# Patient Record
Sex: Male | Born: 1960 | Race: White | Hispanic: No | Marital: Single | Smoking: Never smoker
Health system: Southern US, Community
[De-identification: ages and names within clinical notes are randomized; demographics above are authoritative.]

## PROBLEM LIST (undated history)

## (undated) DIAGNOSIS — I10 Essential (primary) hypertension: Secondary | ICD-10-CM

## (undated) DIAGNOSIS — F428 Other obsessive-compulsive disorder: Secondary | ICD-10-CM

## (undated) DIAGNOSIS — G4730 Sleep apnea, unspecified: Secondary | ICD-10-CM

## (undated) DIAGNOSIS — F429 Obsessive-compulsive disorder, unspecified: Secondary | ICD-10-CM

## (undated) DIAGNOSIS — K219 Gastro-esophageal reflux disease without esophagitis: Secondary | ICD-10-CM

## (undated) DIAGNOSIS — I209 Angina pectoris, unspecified: Secondary | ICD-10-CM

## (undated) DIAGNOSIS — E039 Hypothyroidism, unspecified: Secondary | ICD-10-CM

## (undated) DIAGNOSIS — K529 Noninfective gastroenteritis and colitis, unspecified: Secondary | ICD-10-CM

## (undated) DIAGNOSIS — F32A Depression, unspecified: Secondary | ICD-10-CM

## (undated) DIAGNOSIS — T7840XA Allergy, unspecified, initial encounter: Secondary | ICD-10-CM

## (undated) DIAGNOSIS — F411 Generalized anxiety disorder: Secondary | ICD-10-CM

## (undated) DIAGNOSIS — F329 Major depressive disorder, single episode, unspecified: Secondary | ICD-10-CM

## (undated) DIAGNOSIS — F319 Bipolar disorder, unspecified: Secondary | ICD-10-CM

## (undated) HISTORY — DX: Obsessive-compulsive disorder, unspecified: F42.9

## (undated) HISTORY — PX: MOUTH SURGERY: SHX715

## (undated) HISTORY — PX: NASAL SEPTUM SURGERY: SHX37

## (undated) HISTORY — PX: MYRINGOTOMY WITH TUBE PLACEMENT: SHX5663

## (undated) HISTORY — DX: Other obsessive-compulsive disorder: F42.8

## (undated) HISTORY — DX: Allergy, unspecified, initial encounter: T78.40XA

## (undated) HISTORY — DX: Generalized anxiety disorder: F41.1

## (undated) HISTORY — DX: Gastro-esophageal reflux disease without esophagitis: K21.9

---

## 2006-11-11 ENCOUNTER — Encounter: Payer: Self-pay | Admitting: Orthopedic Surgery

## 2010-01-27 LAB — CBC AND DIFFERENTIAL
HEMATOCRIT: 49 % (ref 41–53)
Hemoglobin: 17.3 g/dL (ref 13.5–17.5)
Neutrophils Absolute: 60 /uL
PLATELETS: 274 10*3/uL (ref 150–399)
WBC: 6.3 10^3/mL

## 2010-04-05 ENCOUNTER — Ambulatory Visit: Payer: Self-pay | Admitting: Family Medicine

## 2011-03-09 LAB — HEPATIC FUNCTION PANEL
ALT: 25 U/L (ref 10–40)
AST: 21 U/L (ref 14–40)
Alkaline Phosphatase: 65 U/L (ref 25–125)
Bilirubin, Total: 0.5 mg/dL

## 2011-03-09 LAB — BASIC METABOLIC PANEL
BUN: 19 mg/dL (ref 4–21)
Creatinine: 0.8 mg/dL (ref <=1.3)
Potassium: 4.4 mmol/L (ref 3.4–5.3)
Sodium: 141 mmol/L (ref 137–147)

## 2011-09-16 ENCOUNTER — Ambulatory Visit: Payer: Self-pay | Admitting: Family Medicine

## 2013-06-19 LAB — LIPID PANEL
CHOLESTEROL: 205 mg/dL — AB (ref 0–200)
HDL: 53 mg/dL (ref 35–70)
LDL CALC: 133 mg/dL
TRIGLYCERIDES: 94 mg/dL (ref 40–160)

## 2013-06-19 LAB — HEMOGLOBIN A1C: HEMOGLOBIN A1C: 5.5 % (ref 4.0–6.0)

## 2013-06-19 LAB — TSH: TSH: 0.77 u[IU]/mL (ref <=5.90)

## 2013-06-19 LAB — BASIC METABOLIC PANEL: Glucose: 98 mg/dL

## 2014-11-10 DIAGNOSIS — F32A Depression, unspecified: Secondary | ICD-10-CM | POA: Insufficient documentation

## 2014-11-10 DIAGNOSIS — F411 Generalized anxiety disorder: Secondary | ICD-10-CM | POA: Insufficient documentation

## 2014-11-10 DIAGNOSIS — F329 Major depressive disorder, single episode, unspecified: Secondary | ICD-10-CM | POA: Insufficient documentation

## 2014-11-10 DIAGNOSIS — E785 Hyperlipidemia, unspecified: Secondary | ICD-10-CM | POA: Insufficient documentation

## 2014-11-10 DIAGNOSIS — K589 Irritable bowel syndrome without diarrhea: Secondary | ICD-10-CM | POA: Insufficient documentation

## 2014-11-10 DIAGNOSIS — E039 Hypothyroidism, unspecified: Secondary | ICD-10-CM | POA: Insufficient documentation

## 2014-11-10 DIAGNOSIS — F319 Bipolar disorder, unspecified: Secondary | ICD-10-CM | POA: Insufficient documentation

## 2014-11-10 DIAGNOSIS — F428 Other obsessive-compulsive disorder: Secondary | ICD-10-CM | POA: Insufficient documentation

## 2014-11-10 DIAGNOSIS — I10 Essential (primary) hypertension: Secondary | ICD-10-CM | POA: Insufficient documentation

## 2014-11-15 ENCOUNTER — Ambulatory Visit (INDEPENDENT_AMBULATORY_CARE_PROVIDER_SITE_OTHER): Payer: Self-pay | Admitting: Family Medicine

## 2014-11-15 ENCOUNTER — Encounter: Payer: Self-pay | Admitting: Family Medicine

## 2014-11-15 VITALS — BP 108/64 | HR 64 | Temp 97.9°F | Resp 16 | Wt 154.0 lb

## 2014-11-15 DIAGNOSIS — L03115 Cellulitis of right lower limb: Secondary | ICD-10-CM

## 2014-11-15 DIAGNOSIS — M7989 Other specified soft tissue disorders: Secondary | ICD-10-CM

## 2014-11-15 DIAGNOSIS — M79671 Pain in right foot: Secondary | ICD-10-CM

## 2014-11-15 MED ORDER — DOXYCYCLINE HYCLATE 100 MG PO TABS: 100.0000 mg | ORAL_TABLET | Freq: Two times a day (BID) | ORAL | Status: DC

## 2014-11-15 NOTE — Patient Instructions (Signed)
Instructed pt to use soap and water to clean foot, also after finish antibiotic use OTC Lamisil.

## 2014-11-15 NOTE — Progress Notes (Signed)
William Coleman.  MRN: [id number] DOB: 1960/11/30  Subjective:  HPI  Pt is having right foot pain and swelling. It has been going on for about a week and a half. He does not know of any trauma that he has done to his foot. He does have a lesion of irritation between his 2nd and 3rd toe on this foot, but reports that that is not causing much pain. He reports that on a scale from 1-10 he is normally about a 2-3. He noticed the swelling more that anything and wanted to get it checked out.    Patient Active Problem List   Diagnosis Date Noted  . Allergic rhinitis 11/10/2014  . Affective bipolar disorder 11/10/2014  . Clinical depression 11/10/2014  . Anxiety, generalized 11/10/2014  . HLD (hyperlipidemia) 11/10/2014  . BP (high blood pressure) 11/10/2014  . Adult hypothyroidism 11/10/2014  . 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/10/2014  . 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 Obstructive apnea 11/10/2014    History reviewed. No pertinent past medical history.  History   Social History  . Marital Status: Single    Spouse Name: N/A  . Number of Children: N/A  . Years of Education: N/A   Occupational History  . Not on file.   Social History Main Topics  . Smoking status: Never Smoker   . Smokeless tobacco: Not on file  . Alcohol Use: Yes     Comment: rarely  . Drug Use: No  . Sexual Activity: Not on file   Other Topics Concern  . Not on file   Social History Narrative    Outpatient Prescriptions Prior to Visit  Medication Sig Dispense Refill  . ARIPiprazole (ABILIFY) 5 MG tablet Take by mouth.    Marland Kitchen ascorbic acid (VITAMIN C) 500 MG tablet Take by mouth.    Marland Kitchen aspirin 81 MG tablet Take by mouth.    . levothyroxine (SYNTHROID, LEVOTHROID) 25 MCG tablet Take by mouth.    . MULTIPLE VITAMIN PO Take by mouth.    . rosuvastatin (CRESTOR) 10 MG tablet Take by mouth.    . dextroamphetamine (DEXTROSTAT) 10 MG tablet Take by mouth.    . losartan (COZAAR) 50 MG tablet Take by mouth.     No  facility-administered medications prior to visit.    Allergies  Allergen Reactions  . Amoxicillin   . Penicillins   . Sulfa Antibiotics     Review of Systems  Constitutional: Negative.   HENT: Negative.   Eyes: Negative.   Respiratory: Negative.   Cardiovascular: Negative.   Gastrointestinal: Negative.   Genitourinary: Negative.   Musculoskeletal: Positive for joint pain (foot pain).  Skin: Negative.   Neurological: Negative.   Endo/Heme/Allergies: Negative.   Psychiatric/Behavioral: Negative.    Objective:  BP 108/64 mmHg  Pulse 64  Temp(Src) 97.9 F (36.6 C) (Oral)  Resp 16  Wt 154 lb (69.854 kg)  Physical Exam  Constitutional: He is well-developed, well-nourished, and in no distress.  HENT:  Head: Normocephalic and atraumatic.  Right Ear: External ear normal.  Left Ear: External ear normal.  Nose: Nose normal.  Mouth/Throat: Oropharynx is clear and moist.  Eyes: Conjunctivae and EOM are normal. Pupils are equal, round, and reactive to light.  Neck: Normal range of motion. Neck supple.  Cardiovascular: Normal rate, regular rhythm, normal heart sounds and intact distal pulses.   Pulmonary/Chest: Effort normal and breath sounds normal.  Musculoskeletal: Normal range of motion.  Neurological: He is alert. He has normal reflexes. Gait normal.  GCS score is 15.  Skin: Skin is warm and dry.  Psychiatric: Mood, memory, affect and judgment normal.    Assessment and Plan :  Foot pain, right  Foot swelling  Foot pain, right  Foot swelling  Cellulitis of right lower extremity - Plan: doxycycline (VIBRA-TABS) 100 MG tablet Treat with Doxy, if not better will get xray. If doxy is too expensive will give Levofloxacin. Instructed pt to use soap and water to clean foot, also after finish antibiotic use OTC Lamisil.   Miguel Aschoff MD [facility] 11/15/2014 1:53 PM

## 2014-11-29 ENCOUNTER — Telehealth: Payer: Self-pay

## 2014-11-29 ENCOUNTER — Ambulatory Visit
Admission: RE | Admit: 2014-11-29 | Discharge: 2014-11-29 | Disposition: A | Discharge status: Home / Self Care | Payer: Self-pay | Source: Ambulatory Visit | Attending: Family Medicine | Admitting: Family Medicine

## 2014-11-29 ENCOUNTER — Ambulatory Visit (INDEPENDENT_AMBULATORY_CARE_PROVIDER_SITE_OTHER): Payer: Self-pay | Admitting: Family Medicine

## 2014-11-29 ENCOUNTER — Encounter: Payer: Self-pay | Admitting: Family Medicine

## 2014-11-29 VITALS — BP 130/84 | HR 72 | Temp 97.8°F | Resp 16 | Ht 67.5 in | Wt 155.2 lb

## 2014-11-29 DIAGNOSIS — R600 Localized edema: Secondary | ICD-10-CM

## 2014-11-29 DIAGNOSIS — M79671 Pain in right foot: Secondary | ICD-10-CM | POA: Insufficient documentation

## 2014-11-29 NOTE — Telephone Encounter (Signed)
-----   Message from Carmon Ginsberg, [place] sent at 11/29/2014  1:36 PM EDT ----- Normal foot x-ray

## 2014-11-29 NOTE — Telephone Encounter (Signed)
Left message to call back  

## 2014-11-29 NOTE — Progress Notes (Signed)
Subjective:     Patient ID: William Coleman., male   DOB: [birth year], 54 y.o.   MRN: [id number]  HPI  Chief Complaint  Patient presents with  . Foot Pain    Patient is present in office for follow up visit from 11/15/14 he states that Dr. Rosanna Randy had placed him on antibiotic which he has finished and pain and swelling are still present. Patient states that he has concerns that there is a possible fracture.   Denies specific injury. 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 Mild pain with w.b.and swelling persists.   Review of Systems     Objective:   Physical Exam  Constitutional: He appears well-developed and well-nourished. No distress.  Musculoskeletal: He exhibits no tenderness. Edema: 1+ right pedal edema.  Ankle and foot ligaments stable with no pain on testing Right DF/PF 5/5.     Assessment:     1. Foot pain, right - DG Foot Complete Right; Future  2. Pedal edema - DG Foot Complete Right; Future    Plan:     Discussed foot elevation and scheduling ibuprofen 400 mg 3 x day with food pending x-ray report.

## 2014-11-29 NOTE — Patient Instructions (Signed)
May use ibuprofen 400 mg. Three times a day with meals Elevate your foot after work

## 2014-11-30 NOTE — Telephone Encounter (Signed)
LMTCB Miangel Flom Drozdowski, CMA  

## 2014-11-30 NOTE — Telephone Encounter (Signed)
Informed pt as below. William Coleman, CMA  

## 2015-08-08 ENCOUNTER — Other Ambulatory Visit: Payer: Self-pay

## 2015-08-08 MED ORDER — LOSARTAN POTASSIUM 50 MG PO TABS: 50.0000 mg | ORAL_TABLET | Freq: Every day | ORAL | Status: DC

## 2015-09-25 ENCOUNTER — Ambulatory Visit: Payer: Self-pay | Admitting: Family Medicine

## 2015-11-09 ENCOUNTER — Encounter: Payer: Self-pay | Admitting: Emergency Medicine

## 2015-11-09 ENCOUNTER — Emergency Department
Admission: EM | Admit: 2015-11-09 | Discharge: 2015-11-10 | Disposition: A | Discharge status: Other Acute Inpatient Hospital | Payer: BLUE CROSS/BLUE SHIELD | Attending: Emergency Medicine | Admitting: Emergency Medicine

## 2015-11-09 DIAGNOSIS — E039 Hypothyroidism, unspecified: Secondary | ICD-10-CM | POA: Diagnosis present

## 2015-11-09 DIAGNOSIS — I10 Essential (primary) hypertension: Secondary | ICD-10-CM | POA: Diagnosis not present

## 2015-11-09 DIAGNOSIS — R45851 Suicidal ideations: Secondary | ICD-10-CM | POA: Insufficient documentation

## 2015-11-09 DIAGNOSIS — K589 Irritable bowel syndrome without diarrhea: Secondary | ICD-10-CM | POA: Diagnosis present

## 2015-11-09 DIAGNOSIS — E785 Hyperlipidemia, unspecified: Secondary | ICD-10-CM | POA: Diagnosis not present

## 2015-11-09 DIAGNOSIS — F429 Obsessive-compulsive disorder, unspecified: Secondary | ICD-10-CM | POA: Insufficient documentation

## 2015-11-09 DIAGNOSIS — F332 Major depressive disorder, recurrent severe without psychotic features: Secondary | ICD-10-CM

## 2015-11-09 DIAGNOSIS — F411 Generalized anxiety disorder: Secondary | ICD-10-CM | POA: Diagnosis present

## 2015-11-09 DIAGNOSIS — F331 Major depressive disorder, recurrent, moderate: Secondary | ICD-10-CM | POA: Diagnosis not present

## 2015-11-09 DIAGNOSIS — F322 Major depressive disorder, single episode, severe without psychotic features: Secondary | ICD-10-CM | POA: Diagnosis not present

## 2015-11-09 HISTORY — DX: Major depressive disorder, single episode, unspecified: F32.9

## 2015-11-09 HISTORY — DX: Obsessive-compulsive disorder, unspecified: F42.9

## 2015-11-09 HISTORY — DX: Depression, unspecified: F32.A

## 2015-11-09 HISTORY — DX: Essential (primary) hypertension: I10

## 2015-11-09 LAB — URINE DRUG SCREEN, QUALITATIVE (ARMC ONLY)
Amphetamines, Ur Screen: NOT DETECTED
BENZODIAZEPINE, UR SCRN: NOT DETECTED
Barbiturates, Ur Screen: NOT DETECTED
CANNABINOID 50 NG, UR ~~LOC~~: NOT DETECTED
Cocaine Metabolite,Ur ~~LOC~~: NOT DETECTED
MDMA (ECSTASY) UR SCREEN: NOT DETECTED
Methadone Scn, Ur: NOT DETECTED
OPIATE, UR SCREEN: NOT DETECTED
PHENCYCLIDINE (PCP) UR S: NOT DETECTED
Tricyclic, Ur Screen: NOT DETECTED

## 2015-11-09 LAB — COMPREHENSIVE METABOLIC PANEL
ALK PHOS: 65 U/L (ref 38–126)
ALT: 25 U/L (ref 17–63)
AST: 25 U/L (ref 15–41)
Albumin: 4.5 g/dL (ref 3.5–5.0)
Anion gap: 8 (ref 5–15)
BILIRUBIN TOTAL: 0.7 mg/dL (ref 0.3–1.2)
BUN: 19 mg/dL (ref 6–20)
CO2: 32 mmol/L (ref 22–32)
CREATININE: 1.08 mg/dL (ref 0.61–1.24)
Calcium: 9.3 mg/dL (ref 8.9–10.3)
Chloride: 99 mmol/L — ABNORMAL LOW (ref 101–111)
GFR calc Af Amer: >60 mL/min (ref >60)
Glucose, Bld: 84 mg/dL (ref 65–99)
Potassium: 3.9 mmol/L (ref 3.5–5.1)
Sodium: 139 mmol/L (ref 135–145)
TOTAL PROTEIN: 7.5 g/dL (ref 6.5–8.1)

## 2015-11-09 LAB — CBC
HCT: 50.5 % (ref 40.0–52.0)
Hemoglobin: 17.4 g/dL (ref 13.0–18.0)
MCH: 31.2 pg (ref 26.0–34.0)
MCHC: 34.4 g/dL (ref 32.0–36.0)
MCV: 90.8 fL (ref 80.0–100.0)
PLATELETS: 298 10*3/uL (ref 150–440)
RBC: 5.56 MIL/uL (ref 4.40–5.90)
RDW: 13.3 % (ref 11.5–14.5)
WBC: 8.7 10*3/uL (ref 3.8–10.6)

## 2015-11-09 LAB — SALICYLATE LEVEL: Salicylate Lvl: <4 mg/dL (ref 2.8–30.0)

## 2015-11-09 LAB — ACETAMINOPHEN LEVEL: Acetaminophen (Tylenol), Serum: <10 ug/mL — ABNORMAL LOW (ref 10–30)

## 2015-11-09 LAB — ETHANOL

## 2015-11-09 MED ORDER — LOSARTAN POTASSIUM 50 MG PO TABS
50.0000 mg | ORAL_TABLET | Freq: Every day | ORAL | Status: DC
  Administered 2015-11-09: 50 mg via ORAL
  Filled 2015-11-09: qty 2
  Filled 2015-11-09: qty 1

## 2015-11-09 NOTE — ED Notes (Signed)
Report received from [place], [place].

## 2015-11-09 NOTE — ED Notes (Signed)
Pt to ED today from [place] after admitting to suicidal ideation.  Pt states he had planned [place].  Pt states he has struggled with depression on a long-term basis.

## 2015-11-09 NOTE — BH Assessment (Signed)
Writer contacted [facility] to follow up Jarrett Soho [phone]). Pt referral still under review.

## 2015-11-09 NOTE — ED Provider Notes (Signed)
[facility] Emergency Department Provider Note  ____________________________________________  Time seen: 7:20 PM  I have reviewed the triage vital signs and the nursing notes.   HISTORY  Chief Complaint suicial ideation     HPI William Coleman. is a 55 y.o. male sent to the ED under IVC from [facility] due to suicidal ideation with a plan to [place]. Patient reports long-standing depression, not on any medications because he "couldn't tolerate the side effects". No specific stressors, no other acute symptoms. Only taking Synthroid right now.     Past Medical History  Diagnosis Date  . Obsessive compulsive disorder   . Depression      Patient Active Problem List   Diagnosis Date Noted  . Allergic rhinitis 11/10/2014  . Affective bipolar disorder ([place]) 11/10/2014  . Clinical depression 11/10/2014  . Anxiety, generalized 11/10/2014  . HLD (hyperlipidemia) 11/10/2014  . BP (high blood pressure) 11/10/2014  . Adult hypothyroidism 11/10/2014  . 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/10/2014  . 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 Obstructive apnea 11/10/2014     Past Surgical History  Procedure Laterality Date  . Nasal septum surgery    . Mouth surgery      orthodontic surgery for underbite  . Myringotomy with tube placement       Current Outpatient Rx  Name  Route  Sig  Dispense  Refill  . ARIPiprazole (ABILIFY) 5 MG tablet   Oral   Take by mouth.         Marland Kitchen ascorbic acid (VITAMIN C) 500 MG tablet   Oral   Take by mouth.         Marland Kitchen aspirin 81 MG tablet   Oral   Take by mouth.         . dextroamphetamine (DEXTROSTAT) 10 MG tablet   Oral   Take by mouth.         . levothyroxine (SYNTHROID, LEVOTHROID) 25 MCG tablet   Oral   Take by mouth.         . losartan (COZAAR) 50 MG tablet   Oral   Take 1 tablet (50 mg total) by mouth daily.   30 tablet   0     Need appointment for further refills please let pt ...   . MULTIPLE VITAMIN PO   Oral   Take by mouth.         . rosuvastatin (CRESTOR) 10 MG tablet   Oral   Take by mouth.            Allergies Amoxicillin; Penicillins; and Sulfa antibiotics   Family History  Problem Relation Age of Onset  . Depression Mother   . Anxiety disorder Mother   . Congestive Heart Failure Father   . Prostate cancer Father   . Hypertension Father     Social History Social History  Substance Use Topics  . Smoking status: Never Smoker   . Smokeless tobacco: None  . Alcohol Use: Yes     Comment: rarely    Review of Systems  Constitutional:   No fever or chills.  Eyes:   No vision changes.  ENT:   No sore throat. No rhinorrhea.Chronic sinus pressure Cardiovascular:   No chest pain. Respiratory:   No dyspnea , positive occasional nonproductive cough Gastrointestinal:   Negative for abdominal pain, vomiting and diarrhea.  Genitourinary:   Negative for dysuria or difficulty urinating. Musculoskeletal:   Negative for focal pain or swelling Neurological:  Negative for headaches 10-point ROS otherwise negative.  ____________________________________________   PHYSICAL EXAM:  VITAL SIGNS: ED Triage Vitals  Enc Vitals Group     BP 11/09/15 1849 172/107 mmHg     Pulse Rate 11/09/15 1849 84     Resp 11/09/15 1849 20     Temp 11/09/15 1849 98.1 F (36.7 C)     Temp src --      SpO2 11/09/15 1849 100 %     Weight 11/09/15 1849 152 lb (68.947 kg)     Height 11/09/15 1849 5\' 7"  (1.702 m)     Head Cir --      Peak Flow --      Pain Score --      Pain Loc --      Pain Edu? --      Excl. in [place]? --     Vital signs reviewed, nursing assessments reviewed.   Constitutional:   Alert and oriented. Well appearing and in no distress. Eyes:   No scleral icterus. No conjunctival pallor. PERRL. EOMI.  No nystagmus. ENT   Head:   Normocephalic and atraumatic.   Nose:   No congestion/rhinnorhea. No septal hematoma   Mouth/Throat:   MMM,  no pharyngeal erythema. No peritonsillar mass.    Neck:   No stridor. No SubQ emphysema. No meningismus. Hematological/Lymphatic/Immunilogical:   No cervical lymphadenopathy. Cardiovascular:   RRR. Symmetric bilateral radial and DP pulses.  No murmurs.  Respiratory:   Normal respiratory effort without tachypnea nor retractions. Breath sounds are clear and equal bilaterally. No wheezes/rales/rhonchi. Gastrointestinal:   Soft and nontender. Non distended. There is no CVA tenderness.  No rebound, rigidity, or guarding. Genitourinary:   deferred Musculoskeletal:   Nontender with normal range of motion in all extremities. No joint effusions.  No lower extremity tenderness.  No edema. Neurologic:   Normal speech and language.  CN 2-10 normal. Motor grossly intact. No gross focal neurologic deficits are appreciated.  Skin:    Skin is warm, dry and intact. No rash noted.  No petechiae, purpura, or bullae. No apparent injuries  ____________________________________________    LABS (pertinent positives/negatives) (all labs ordered are listed, but only abnormal results are displayed) Labs Reviewed  COMPREHENSIVE METABOLIC PANEL  ETHANOL  SALICYLATE LEVEL  ACETAMINOPHEN LEVEL  CBC  URINE DRUG SCREEN, QUALITATIVE ([place])   ____________________________________________   EKG    ____________________________________________    RADIOLOGY    ____________________________________________   PROCEDURES   ____________________________________________   INITIAL IMPRESSION / ASSESSMENT AND PLAN / ED COURSE  Pertinent labs & imaging results that were available during my care of the patient were reviewed by me and considered in my medical decision making (see chart for details).  Patient sent to ED under involuntary commitment. We'll maintain commitment for now. Appears to be medically stable without acute medical symptoms. Not psychotic, calm and cooperative, not requiring any  chemical restraint. Plan from [facility] is for inpatient management. We will await placement for inpatient psychiatric management versus follow-up reassessment by psychiatry and further recommendations..     ____________________________________________   FINAL CLINICAL IMPRESSION(S) / ED DIAGNOSES  Final diagnoses:  Severe episode of recurrent major depressive disorder, without psychotic features ([place])  Suicidal ideation       Portions of this note were generated with dragon dictation software. Dictation errors may occur despite best attempts at proofreading.   Carrie Mew, MD 11/09/15 [phone]

## 2015-11-09 NOTE — BH Assessment (Signed)
Writer received phone call from [place] [phone]) stating, while the patient was with them, they frowarded his information to inpatient facilities for placement option. While patient was in transit to the ER, they received a phone call from [facility], about the patient.   Writer called [facility] ([phone]) and was told they received the information and he was pending review. At this time, patient do not have a bed offer.  Writer updated the ER Staff (Dr. Joni Fears, ER MD, Edmonia Lynch, RN) of status.

## 2015-11-09 NOTE — BHH Counselor (Signed)
Talked to Dr. Joni Fears, [place] at [facility] regarding status of pt and if assessment was needed.  Earlier note from [employer] and others indicated that pt was brought in by [facility] with assessment completed and intake information already sent to [employer].  EDP confirmed that no assessment was needed.  EDP asked that SW could follow-up if possible on placement status at [facility]. Advised staff working with placement.  Faylene Kurtz, MS, CRC, [place] Triage Specialist [facility]

## 2015-11-09 NOTE — ED Notes (Signed)
Patient to [facility] from ED ambulatory without difficulty, to room. Patient is alert and oriented in no apparent distress. Patient denies SI, HI, and AVH at this time. Patient remains calm and cooperative. Patient made aware of security cameras and Q15 minute rounds. Patient encouraged to let Nursing staff know of any concerns or needs.

## 2015-11-09 NOTE — ED Notes (Signed)
Pt ambulatory to treatment room with steady gait. Patient presents to ED with c/o "I have been feeling depressed and suicidal..ibuprofen haven't been happy I haven't been taking any of my medications except Synthroid because back in the 90's I couldn't handle the side effects." Patient reported has plan of walking out [place]. Patient alert and oriented x 4, no increased work in breathing noted. Pt calm and cooperative, verbally acknowledged to contract for safety. MD at bedside.

## 2015-11-10 ENCOUNTER — Inpatient Hospital Stay
Admission: RE | Admit: 2015-11-10 | Discharge: 2015-11-17 | DRG: 885 | Disposition: A | Discharge status: Home / Self Care | Payer: BLUE CROSS/BLUE SHIELD | Source: Intra-hospital | Attending: Psychiatry | Admitting: Psychiatry

## 2015-11-10 DIAGNOSIS — E785 Hyperlipidemia, unspecified: Secondary | ICD-10-CM | POA: Diagnosis present

## 2015-11-10 DIAGNOSIS — I10 Essential (primary) hypertension: Secondary | ICD-10-CM | POA: Diagnosis not present

## 2015-11-10 DIAGNOSIS — Z880 Allergy status to penicillin: Secondary | ICD-10-CM

## 2015-11-10 DIAGNOSIS — R45851 Suicidal ideations: Secondary | ICD-10-CM | POA: Diagnosis present

## 2015-11-10 DIAGNOSIS — F429 Obsessive-compulsive disorder, unspecified: Secondary | ICD-10-CM | POA: Diagnosis present

## 2015-11-10 DIAGNOSIS — J309 Allergic rhinitis, unspecified: Secondary | ICD-10-CM | POA: Diagnosis present

## 2015-11-10 DIAGNOSIS — Z8249 Family history of ischemic heart disease and other diseases of the circulatory system: Secondary | ICD-10-CM

## 2015-11-10 DIAGNOSIS — G4730 Sleep apnea, unspecified: Secondary | ICD-10-CM | POA: Diagnosis not present

## 2015-11-10 DIAGNOSIS — E039 Hypothyroidism, unspecified: Secondary | ICD-10-CM | POA: Diagnosis not present

## 2015-11-10 DIAGNOSIS — Z9889 Other specified postprocedural states: Secondary | ICD-10-CM | POA: Diagnosis not present

## 2015-11-10 DIAGNOSIS — Z8042 Family history of malignant neoplasm of prostate: Secondary | ICD-10-CM | POA: Diagnosis not present

## 2015-11-10 DIAGNOSIS — G4700 Insomnia, unspecified: Secondary | ICD-10-CM | POA: Diagnosis not present

## 2015-11-10 DIAGNOSIS — F332 Major depressive disorder, recurrent severe without psychotic features: Secondary | ICD-10-CM

## 2015-11-10 DIAGNOSIS — Z882 Allergy status to sulfonamides status: Secondary | ICD-10-CM | POA: Diagnosis not present

## 2015-11-10 DIAGNOSIS — F411 Generalized anxiety disorder: Secondary | ICD-10-CM | POA: Diagnosis present

## 2015-11-10 DIAGNOSIS — Z818 Family history of other mental and behavioral disorders: Secondary | ICD-10-CM

## 2015-11-10 DIAGNOSIS — K589 Irritable bowel syndrome without diarrhea: Secondary | ICD-10-CM | POA: Diagnosis present

## 2015-11-10 HISTORY — DX: Hypothyroidism, unspecified: E03.9

## 2015-11-10 HISTORY — DX: Sleep apnea, unspecified: G47.30

## 2015-11-10 HISTORY — DX: Angina pectoris, unspecified: I20.9

## 2015-11-10 HISTORY — DX: Bipolar disorder, unspecified: F31.9

## 2015-11-10 MED ORDER — LEVOTHYROXINE SODIUM 25 MCG PO TABS: 25.0000 ug | ORAL_TABLET | Freq: Every day | ORAL | Status: DC

## 2015-11-10 MED ORDER — ACETAMINOPHEN 325 MG PO TABS
650.0000 mg | ORAL_TABLET | Freq: Once | ORAL | Status: AC
  Administered 2015-11-10: 650 mg via ORAL
  Filled 2015-11-10: qty 2

## 2015-11-10 NOTE — ED Provider Notes (Signed)
-----------------------------------------   7:05 PM on 11/10/2015 -----------------------------------------   Blood pressure 136/97, pulse 68, temperature 97.8 F (36.6 C), temperature source Oral, resp. rate 16, height 5\' 7"  (1.702 m), weight 68.947 kg, SpO2 100 %.  The patient had no acute events since last update.  Admitted downstairs to behavioral health for depression   Hinda Kehr, MD 11/10/15 1905

## 2015-11-10 NOTE — Progress Notes (Signed)
Snack tray given at this time.

## 2015-11-10 NOTE — BH Assessment (Signed)
Assessment Note  William Coleman. is an 55 y.o. male who presents to the ER, after being petitioned by [name]. Patient initially went to [facility] for mental health treatment. He left his job, during his lunch break and went to [employer], as a walk in.  During his evaluation, he voiced SI with plan, to walk into ongoing traffic and or jump off a side of a bridge. He told this Probation officer, he would pull to the side of the road an "pretend like I'm working" on his driver back tire. And when a pick-up truck comes close by he will step out in front of it.    Current stressors that are contributing to his depression are; lack of relationship, ongoing depression and feeling like is an disappointment to his family. Patient does not like his job. He has worked at the same place for approximately two years. He works in Scientist, research (medical), as a Clinical research associate.  He is currently receiving mental health treatment, with [facility]. He is receiving medication management and prescribing physician is [name]. He also receives Interior and spatial designer and sees his worker two times a week.  Patient denies having a history of violence and aggression. He has no involvement with the legal system. He also denies the use of mind-altering substances.  Patient is denying HI and AV/H.  Diagnosis: Depression  Past Medical History:  Past Medical History  Diagnosis Date  . Obsessive compulsive disorder   . Depression   . Hypertension     Past Surgical History  Procedure Laterality Date  . Nasal septum surgery    . Mouth surgery      orthodontic surgery for underbite  . Myringotomy with tube placement      Family History:  Family History  Problem Relation Age of Onset  . Depression Mother   . Anxiety disorder Mother   . Congestive Heart Failure Father   . Prostate cancer Father   . Hypertension Father     Social History:  reports that he has never smoked. He does not have any smokeless tobacco history on file. He reports that he drinks alcohol.  He reports that he does not use illicit drugs.  Additional Social History:  Alcohol / Drug Use Pain Medications: See PTA Prescriptions: See PTA Over the Counter: See PTA History of alcohol / drug use?: No history of alcohol / drug abuse Longest period of sobriety (when/how long): Denies past and current use Negative Consequences of Use:  (Denies past and current use) Withdrawal Symptoms:  (Denies past and current use)  CIWA: CIWA-Ar BP: (!) 136/97 mmHg Pulse Rate: 68 COWS:    Allergies:  Allergies  Allergen Reactions  . Amoxicillin   . Penicillins   . Sulfa Antibiotics     Home Medications:  (Not in a hospital admission)  OB/GYN Status:  No LMP for male patient.  General Assessment Data Location of Assessment: [facility] ED TTS Assessment: In system Is this a Tele or Face-to-Face Assessment?: Face-to-Face Is this an Initial Assessment or a Re-assessment for this encounter?: Initial Assessment Marital status: Single Maiden name: n/a Is patient pregnant?: No Pregnancy Status: No Living Arrangements: Alone Can pt return to current living arrangement?: Yes Admission Status: Involuntary Is patient capable of signing voluntary admission?: No Referral Source: Other ([facility]) Insurance type: Insurance risk surveyor Exam (Rule) Medical Exam completed: Yes  Crisis Care Plan Living Arrangements: Alone Legal Guardian: Other: Name of Psychiatrist: [name] ([place]) Name of Therapist: [name] (Peer Support,  [facility])  Education Status Is patient currently in school?: No Current Grade: n/a Highest grade of school patient has completed: Some Secretary/administrator (Two Years) Name of school: n/a Contact person: n/a  Risk to self with the past 6 months Suicidal Ideation: Yes-Currently Present Has patient been a risk to self within the past 6 months prior to admission? : Yes Suicidal Intent: Yes-Currently Present Has patient had any suicidal intent within the past 6 months prior to  admission? : Yes Is patient at risk for suicide?: Yes Suicidal Plan?: Yes-Currently Present Has patient had any suicidal plan within the past 6 months prior to admission? : Yes Specify Current Suicidal Plan: Walk into ongoing traffic Access to Means: No What has been your use of drugs/alcohol within the last 12 months?: Reports of none Previous Attempts/Gestures: No How many times?: 0 Other Self Harm Risks: Reports of none Triggers for Past Attempts: None known Intentional Self Injurious Behavior: None Family Suicide History: No Recent stressful life event(s): Other (Comment), Loss (Comment), Financial Problems Persecutory voices/beliefs?: No Depression: Yes Depression Symptoms: Feeling worthless/self pity, Loss of interest in usual pleasures, Guilt, Fatigue, Isolating, Tearfulness Substance abuse history and/or treatment for substance abuse?: No Suicide prevention information given to non-admitted patients: Not applicable  Risk to Others within the past 6 months Homicidal Ideation: No Does patient have any lifetime risk of violence toward others beyond the six months prior to admission? : No Thoughts of Harm to Others: No Current Homicidal Intent: No Current Homicidal Plan: No Access to Homicidal Means: No Identified Victim: Reports of none History of harm to others?: No Violent Behavior Description: Reports of none Does patient have access to weapons?: No Does patient have a court date: No Is patient on probation?: No  Psychosis Hallucinations: None noted Delusions: None noted  Mental Status Report Appearance/Hygiene: In scrubs, Unremarkable, In hospital gown Eye Contact: Good Motor Activity: Freedom of movement, Unremarkable Speech: Logical/coherent Level of Consciousness: Alert Mood: Depressed, Sad, Helpless, Pleasant Affect: Sad, Depressed, Appropriate to circumstance Anxiety Level: Minimal Thought Processes: Coherent, Relevant Judgement: Partial Orientation:  Person, Place, Time, Situation, Appropriate for developmental age Obsessive Compulsive Thoughts/Behaviors: Minimal  Cognitive Functioning Concentration: Normal Memory: Recent Intact, Remote Intact IQ: Average Insight: Fair Impulse Control: Poor Appetite: Fair Weight Loss: 0 Weight Gain: 0 Sleep: No Change Total Hours of Sleep: 9 Vegetative Symptoms: None  ADLScreening [facility]) Patient's cognitive ability adequate to safely complete daily activities?: Yes Patient able to express need for assistance with ADLs?: Yes Independently performs ADLs?: Yes (appropriate for developmental age)  Prior Inpatient Therapy Prior Inpatient Therapy: No Prior Therapy Dates: Reports of none Prior Therapy Facilty/Provider(s): Reports of none Reason for Treatment: Reports of none  Prior Outpatient Therapy Prior Outpatient Therapy: Yes Prior Therapy Dates: Currently Prior Therapy Facilty/Provider(s): [facility] Reason for Treatment: Depression Does patient have an ACCT team?: No Does patient have [facility] services? : No Does patient have P4CC services?: No  ADL Screening (condition at time of admission) Patient's cognitive ability adequate to safely complete daily activities?: Yes Is the patient deaf or have difficulty hearing?: No Does the patient have difficulty seeing, even when wearing glasses/contacts?: No Does the patient have difficulty concentrating, remembering, or making decisions?: No Patient able to express need for assistance with ADLs?: Yes Does the patient have difficulty dressing or bathing?: No Independently performs ADLs?: Yes (appropriate for developmental age) Does the patient have difficulty walking or climbing stairs?: No Weakness of Legs: None Weakness of Arms/Hands: None  Home Assistive Devices/Equipment  Home Assistive Devices/Equipment: None  Therapy Consults (therapy consults require a physician order) PT Evaluation Needed: No OT Evalulation Needed:  No SLP Evaluation Needed: No Abuse/Neglect Assessment (Assessment to be complete while patient is alone) Physical Abuse: Denies Verbal Abuse: Denies Sexual Abuse: Denies Exploitation of patient/patient's resources: Denies Self-Neglect: Denies Values / Beliefs Cultural Requests During Hospitalization: None Spiritual Requests During Hospitalization: None Consults Spiritual Care Consult Needed: No Social Work Consult Needed: No Regulatory affairs officer (For Healthcare) Does patient have an advance directive?: No    Additional Information 1:1 In Past 12 Months?: No CIRT Risk: No Elopement Risk: No Does patient have medical clearance?: Yes  Child/Adolescent Assessment Running Away Risk: Denies (Patient is an adult)  Disposition:  Disposition Initial Assessment Completed for this Encounter: Yes Disposition of Patient: Other dispositions (ER MD ordered Psych Consult) Other disposition(s): Other (Comment) (ER MD ordered Psych Consult)  On Site Evaluation by:   Reviewed with Physician:    Gunnar Fusi MS, LCAS, [place], [place], CCSI Therapeutic Triage Specialist 11/10/2015 11:28 AM

## 2015-11-10 NOTE — ED Provider Notes (Signed)
-----------------------------------------   6:09 AM on 11/10/2015 -----------------------------------------   Blood pressure 140/108, pulse 75, temperature 98.1 F (36.7 C), resp. rate 20, height 5\' 7"  (1.702 m), weight 152 lb (68.947 kg), SpO2 99 %.  The patient had no acute events since last update.  Calm and cooperative at this time.  Disposition is pending per Psychiatry/Behavioral Medicine team recommendations.     Delman Kitten, MD 11/10/15 [phone]

## 2015-11-10 NOTE — ED Notes (Signed)
Pt calm and cooperative and visible in the dayroom interacting with peers. Pt denies SI/HI and pain.No noted distress or abnormal behaviors noted or observed. Will continue 15 minute checks and observation by security camera for safety.

## 2015-11-10 NOTE — BH Assessment (Signed)
Patient is to be admitted to [facility] by Dr. Weber Cooks.  Attending Physician will be Dr. Bary Leriche.   Patient has been assigned to room 313, by [place] Nurse Seth Bake.   Intake Paper Work has been signed and placed on patient chart.  ER staff is aware of the admission Estill Bamberg, ER Sect.; Dr. Karma Greaser, ER MD; Drema Halon., Patient's Nurse & Truman Hayward, Patient Access).

## 2015-11-10 NOTE — ED Notes (Signed)
Report given to oncoming shift. No issues to report at this time.Safety maintained.

## 2015-11-10 NOTE — ED Notes (Signed)
Pt calm and cooperative. Pt states he currently does not endorse SI. Denies HI and pain. Pt has been feeling depressed. Triggers are the stress of meeting employer expectations and struggle to find a girlfriend. Pt hesitant to take any mediations (other than Synthroid) due to issues with side effects. Pt informed he will speak to a psychiatrist this morning. Pt accepting.

## 2015-11-10 NOTE — ED Notes (Signed)
Patient in dayroom, socializing with a peer at this time.Will continue 15 minute checks and observation by security camera for safety.

## 2015-11-10 NOTE — ED Notes (Signed)
Patient resting quietly in day room. No noted distress or abnormal behaviors noted. Will continue 15 minute checks and observation by security camera for safety.  Pt to be admited to BMU when bed is available.

## 2015-11-10 NOTE — ED Notes (Signed)
Patient speaking with TTS in room. No noted distress or abnormal behaviors noted. Will continue 15 minute checks and observation by security camera for safety.

## 2015-11-10 NOTE — ED Notes (Signed)
Report attempted to [facility] - per charge nurse RN performing EKG and will return the call.

## 2015-11-10 NOTE — ED Notes (Signed)
Patient resting quietly in room. No noted distress or abnormal behaviors noted. Will continue 15 minute checks and observation by security camera for safety. 

## 2015-11-10 NOTE — BH Assessment (Signed)
Writer follow up with [facility] (Justin-[phone]), he is states, "We do not have that referral." Writer informed him, he spoke with them Joyice Faster) about it on yesterday and still reports he doesn't not have it.

## 2015-11-10 NOTE — ED Notes (Signed)
Tylenol administer per MD for headache.

## 2015-11-10 NOTE — Consult Note (Signed)
[facility] Face-to-Face Psychiatry Consult   Reason for Consult:  Consult for this 55 year old man with a history of depression who presents with depressed mood Referring Physician:  Edd Fabian Patient Identification: William Coleman. MRN:  [id number] Principal Diagnosis: Severe recurrent major depression without psychotic features [facility]) Diagnosis:   Patient Active Problem List   Diagnosis Date Noted  . Severe recurrent major depression without psychotic features ([place]) [F33.2] 11/10/2015  . Suicidal ideation [R45.851] 11/10/2015  . Allergic rhinitis [J30.9] 11/10/2014  . Affective bipolar disorder ([place]) [F31.9] 11/10/2014  . Clinical depression [F32.9] 11/10/2014  . Anxiety, generalized [F41.1] 11/10/2014  . HLD (hyperlipidemia) [E78.5] 11/10/2014  . BP (high blood pressure) [I10] 11/10/2014  . Adult hypothyroidism [E03.9] 11/10/2014  . 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 Obstructive apnea [G47.33] 11/10/2014    Total Time spent with patient: 1 hour  Subjective:   William Coleman. is a 55 y.o. male patient admitted with "I've been really depressed".  HPI:  Patient interviewed. Chart reviewed. Labs and vitals reviewed. Patient who normally goes to [facility] presents to the emergency room saying that he's been very depressed for a while. He's been having suicidal thoughts with more thoughts and intrusive anxiety recently. He describes chronic anxiety with obsessive-compulsive features and excessive worry. He has been going to [place] but apparently is not treated with any kind of medication at all, a situation which I don't really understand. He is not abusing drugs or alcohol. He says he feels nervous all the time. Feels negative a lot about himself. Seems to feel helpless and like he has no direction.  Substance abuse history: No known past substance abuse history denies any recent substance abuse.  Social history: He lives alone in a rental apartment.  Broke up with a girlfriend several years ago and since then has been feeling more aimless and depressed. 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 Closest relative is his sister.  Medical history: Hypothyroidism. No other active ongoing medical problems although he has a lot of complaints of symptoms such as a chronic runny nose and problems with his bowel movements.  Past Psychiatric History: Denies any history of suicide attempts. Denies any previous hospitalizations. Has been going to [place] for years. It's a little hard to follow his history as it's rambling. Says he's had OCD since 1992 and has been on a large number of medications that he can't tolerate.  Risk to Self: Suicidal Ideation: Yes-Currently Present Suicidal Intent: Yes-Currently Present Is patient at risk for suicide?: Yes Suicidal Plan?: Yes-Currently Present Specify Current Suicidal Plan: Walk into ongoing traffic Access to Means: No What has been your use of drugs/alcohol within the last 12 months?: Reports of none How many times?: 0 Other Self Harm Risks: Reports of none Triggers for Past Attempts: None known Intentional Self Injurious Behavior: None Risk to Others: Homicidal Ideation: No Thoughts of Harm to Others: No Current Homicidal Intent: No Current Homicidal Plan: No Access to Homicidal Means: No Identified Victim: Reports of none History of harm to others?: No Violent Behavior Description: Reports of none Does patient have access to weapons?: No Does patient have a court date: No Prior Inpatient Therapy: Prior Inpatient Therapy: No Prior Therapy Dates: Reports of none Prior Therapy Facilty/Provider(s): Reports of none Reason for Treatment: Reports of none Prior Outpatient Therapy: Prior Outpatient Therapy: Yes Prior Therapy Dates: Currently Prior Therapy Facilty/Provider(s): [facility] Reason for Treatment: Depression Does patient have  an ACCT team?: No Does patient have [facility]  services? : No Does patient have P4[facility] services?: No  Past Medical History:  Past Medical History  Diagnosis Date  . Obsessive compulsive disorder   . Depression   . Hypertension     Past Surgical History  Procedure Laterality Date  . Nasal septum surgery    . Mouth surgery      orthodontic surgery for underbite  . Myringotomy with tube placement     Family History:  Family History  Problem Relation Age of Onset  . Depression Mother   . Anxiety disorder Mother   . Congestive Heart Failure Father   . Prostate cancer Father   . Hypertension Father    Family Psychiatric  History: Mother had anxiety symptoms Social History:  History  Alcohol Use  . Yes    Comment: rarely     History  Drug Use No    Social History   Social History  . Marital Status: Single    Spouse Name: N/A  . Number of Children: N/A  . Years of Education: N/A   Social History Main Topics  . Smoking status: Never Smoker   . Smokeless tobacco: None  . Alcohol Use: Yes     Comment: rarely  . Drug Use: No  . Sexual Activity: Not Asked   Other Topics Concern  . None   Social History Narrative   Additional Social History:    Allergies:   Allergies  Allergen Reactions  . Amoxicillin   . Penicillins   . Sulfa Antibiotics     Labs:  Results for orders placed or performed during the hospital encounter of 11/09/15 (from the past 48 hour(s))  Comprehensive metabolic panel     Status: Abnormal   Collection Time: 11/09/15  6:50 PM  Result Value Ref Range   Sodium 139 135 - 145 mmol/L   Potassium 3.9 3.5 - 5.1 mmol/L   Chloride 99 (L) 101 - 111 mmol/L   CO2 32 22 - 32 mmol/L   Glucose, Bld 84 65 - 99 mg/dL   BUN 19 6 - 20 mg/dL   Creatinine, Ser 1.08 0.61 - 1.24 mg/dL   Calcium 9.3 8.9 - 10.3 mg/dL   Total Protein 7.5 6.5 - 8.1 g/dL   Albumin 4.5 3.5 - 5.0 g/dL   AST 25 15 - 41 U/L   ALT 25 17 - 63 U/L   Alkaline Phosphatase 65 38 - 126 U/L   Total Bilirubin 0.7 0.3 - 1.2 mg/dL    GFR calc non Af Amer >60 >60 mL/min   GFR calc Af Amer >60 >60 mL/min    Comment: (NOTE) The eGFR has been calculated using the CKD EPI equation. This calculation has not been validated in all clinical situations. eGFR's persistently <60 mL/min signify possible Chronic Kidney Disease.    Anion gap 8 5 - 15  Ethanol     Status: None   Collection Time: 11/09/15  6:50 PM  Result Value Ref Range   Alcohol, Ethyl (B) <5 <5 mg/dL    Comment:        LOWEST DETECTABLE LIMIT FOR SERUM ALCOHOL IS 5 mg/dL FOR MEDICAL PURPOSES ONLY   Salicylate level     Status: None   Collection Time: 11/09/15  6:50 PM  Result Value Ref Range   Salicylate Lvl <8.6 2.8 - 30.0 mg/dL  Acetaminophen level     Status: Abnormal   Collection Time: 11/09/15  6:50 PM  Result  Value Ref Range   Acetaminophen (Tylenol), Serum <10 (L) 10 - 30 ug/mL    Comment:        THERAPEUTIC CONCENTRATIONS VARY SIGNIFICANTLY. A RANGE OF 10-30 ug/mL MAY BE AN EFFECTIVE CONCENTRATION FOR MANY PATIENTS. HOWEVER, SOME ARE BEST TREATED AT CONCENTRATIONS OUTSIDE THIS RANGE. ACETAMINOPHEN CONCENTRATIONS >150 ug/mL AT 4 HOURS AFTER INGESTION AND >50 ug/mL AT 12 HOURS AFTER INGESTION ARE OFTEN ASSOCIATED WITH TOXIC REACTIONS.   cbc     Status: None   Collection Time: 11/09/15  6:50 PM  Result Value Ref Range   WBC 8.7 3.8 - 10.6 K/uL   RBC 5.56 4.40 - 5.90 MIL/uL   Hemoglobin 17.4 13.0 - 18.0 g/dL   HCT 50.5 40.0 - 52.0 %   MCV 90.8 80.0 - 100.0 fL   MCH 31.2 26.0 - 34.0 pg   MCHC 34.4 32.0 - 36.0 g/dL   RDW 13.3 11.5 - 14.5 %   Platelets 298 150 - 440 K/uL  Urine Drug Screen, Qualitative     Status: None   Collection Time: 11/09/15  6:50 PM  Result Value Ref Range   Tricyclic, Ur Screen NONE DETECTED NONE DETECTED   Amphetamines, Ur Screen NONE DETECTED NONE DETECTED   MDMA (Ecstasy)Ur Screen NONE DETECTED NONE DETECTED   Cocaine Metabolite,Ur [place] NONE DETECTED NONE DETECTED   Opiate, Ur Screen NONE DETECTED NONE  DETECTED   Phencyclidine (PCP) Ur S NONE DETECTED NONE DETECTED   Cannabinoid 50 Ng, Ur [place] NONE DETECTED NONE DETECTED   Barbiturates, Ur Screen NONE DETECTED NONE DETECTED   Benzodiazepine, Ur Scrn NONE DETECTED NONE DETECTED   Methadone Scn, Ur NONE DETECTED NONE DETECTED    Comment: (NOTE) 992  Tricyclics, urine               Cutoff 1000 ng/mL 200  Amphetamines, urine             Cutoff 1000 ng/mL 300  MDMA (Ecstasy), urine           Cutoff 500 ng/mL 400  Cocaine Metabolite, urine       Cutoff 300 ng/mL 500  Opiate, urine                   Cutoff 300 ng/mL 600  Phencyclidine (PCP), urine      Cutoff 25 ng/mL 700  Cannabinoid, urine              Cutoff 50 ng/mL 800  Barbiturates, urine             Cutoff 200 ng/mL 900  Benzodiazepine, urine           Cutoff 200 ng/mL 1000 Methadone, urine                Cutoff 300 ng/mL 1100 1200 The urine drug screen provides only a preliminary, unconfirmed 1300 analytical test result and should not be used for non-medical 1400 purposes. Clinical consideration and professional judgment should 1500 be applied to any positive drug screen result due to possible 1600 interfering substances. A more specific alternate chemical method 1700 must be used in order to obtain a confirmed analytical result.  1800 Gas chromato graphy / mass spectrometry (GC/MS) is the preferred 1900 confirmatory method.     Current Facility-Administered Medications  Medication Dose Route Frequency Provider Last Rate Last Dose  . losartan (COZAAR) tablet 50 mg  50 mg Oral Daily Carrie Mew, MD   50 mg at 11/09/15 2204   Current Outpatient  Prescriptions  Medication Sig Dispense Refill  . ARIPiprazole (ABILIFY) 5 MG tablet Take by mouth.    Marland Kitchen ascorbic acid (VITAMIN C) 500 MG tablet Take by mouth.    Marland Kitchen aspirin 81 MG tablet Take by mouth.    . dextroamphetamine (DEXTROSTAT) 10 MG tablet Take by mouth.    . levothyroxine (SYNTHROID, LEVOTHROID) 25 MCG tablet Take by  mouth.    . losartan (COZAAR) 50 MG tablet Take 1 tablet (50 mg total) by mouth daily. 30 tablet 0  . MULTIPLE VITAMIN PO Take by mouth.    . rosuvastatin (CRESTOR) 10 MG tablet Take by mouth.      Musculoskeletal: Strength & Muscle Tone: within normal limits Gait & Station: normal Patient leans: N/A  Psychiatric Specialty Exam: Physical Exam  Nursing note and vitals reviewed. Constitutional: He appears well-developed and well-nourished.  HENT:  Head: Normocephalic and atraumatic.  Eyes: Conjunctivae are normal. Pupils are equal, round, and reactive to light.  Neck: Normal range of motion.  Cardiovascular: Regular rhythm and normal heart sounds.   Respiratory: Effort normal. No respiratory distress.  GI: Soft.  Musculoskeletal: Normal range of motion.  Neurological: He is alert.  Skin: Skin is warm and dry.  Psychiatric: His speech is delayed. He is slowed. Cognition and memory are normal. He expresses impulsivity. He exhibits a depressed mood. He expresses suicidal ideation.    Review of Systems  Constitutional: Negative.   HENT: Positive for congestion.   Eyes: Negative.   Respiratory: Negative.   Cardiovascular: Negative.   Gastrointestinal: Positive for diarrhea and constipation.  Musculoskeletal: Negative.   Skin: Negative.   Neurological: Negative.   Psychiatric/Behavioral: Positive for depression and suicidal ideas. Negative for hallucinations, memory loss and substance abuse. The patient is nervous/anxious and has insomnia.     Blood pressure 136/97, pulse 68, temperature 97.8 F (36.6 C), temperature source Oral, resp. rate 16, height _0  (1.702 m), weight 68.947 kg (152 lb), SpO2 100 %.Body mass index is 23.8 kg/(m^2).  General Appearance: Disheveled  Eye Contact:  Minimal  Speech:  Clear and Coherent  Volume:  Decreased  Mood:  Anxious  Affect:  Blunt  Thought Process:  Descriptions of Associations: Tangential  Orientation:  Full (Time, Place, and Person)   Thought Content:  Logical, Obsessions and Rumination  Suicidal Thoughts:  Yes.  with intent/plan  Homicidal Thoughts:  No  Memory:  Immediate;   Good Recent;   Fair Remote;   Fair  Judgement:  Fair  Insight:  Fair  Psychomotor Activity:  Decreased  Concentration:  Concentration: Fair  Recall:  [employer] of Knowledge:  Fair  Language:  Fair  Akathisia:  No  Handed:  Right  AIMS (if indicated):     Assets:  Communication Skills Desire for Improvement Financial Resources/Insurance Housing Resilience  ADL's:  Intact  Cognition:  WNL  Sleep:        Treatment Plan Summary: Daily contact with patient to assess and evaluate symptoms and progress in treatment, Medication management and Plan Patient with recurrent and worsening depression with suicidal ideation. Not taking any medicine. Poor outpatient supports. I agree with sustaining the commitment. Patient will be admitted to the psychiatry ward. Continue his current regular medical medications for hypothyroidism. Supportive counseling. Continuous observation and 15 minute checks.  Disposition: Recommend psychiatric Inpatient admission when medically cleared. Supportive therapy provided about ongoing stressors.  Alethia Berthold, MD 11/10/2015 5:35 PM

## 2015-11-11 DIAGNOSIS — F332 Major depressive disorder, recurrent severe without psychotic features: Principal | ICD-10-CM

## 2015-11-11 LAB — TSH: TSH: 1.123 u[IU]/mL (ref 0.350–4.500)

## 2015-11-11 MED ORDER — MAGNESIUM HYDROXIDE 400 MG/5ML PO SUSP: 30.0000 mL | Freq: Every day | ORAL | Status: DC | PRN

## 2015-11-11 MED ORDER — ACETAMINOPHEN 325 MG PO TABS
650.0000 mg | ORAL_TABLET | Freq: Four times a day (QID) | ORAL | Status: DC | PRN
  Administered 2015-11-11 – 2015-11-17 (×7): 650 mg via ORAL
  Filled 2015-11-11 (×8): qty 2

## 2015-11-11 MED ORDER — ESCITALOPRAM OXALATE 10 MG PO TABS
5.0000 mg | ORAL_TABLET | Freq: Every day | ORAL | Status: DC
  Administered 2015-11-12 – 2015-11-16 (×5): 5 mg via ORAL
  Filled 2015-11-11 (×5): qty 1

## 2015-11-11 MED ORDER — LOSARTAN POTASSIUM 50 MG PO TABS
50.0000 mg | ORAL_TABLET | Freq: Every day | ORAL | Status: DC
  Administered 2015-11-11 – 2015-11-17 (×7): 50 mg via ORAL
  Filled 2015-11-11 (×7): qty 1

## 2015-11-11 MED ORDER — ALUM & MAG HYDROXIDE-SIMETH 200-200-20 MG/5ML PO SUSP: 30.0000 mL | ORAL | Status: DC | PRN

## 2015-11-11 MED ORDER — LEVOTHYROXINE SODIUM 25 MCG PO TABS
25.0000 ug | ORAL_TABLET | Freq: Every day | ORAL | Status: DC
  Administered 2015-11-11 – 2015-11-17 (×7): 25 ug via ORAL
  Filled 2015-11-11 (×8): qty 1

## 2015-11-11 NOTE — Tx Team (Signed)
Initial Interdisciplinary Treatment Plan   PATIENT STRESSORS: Loneliness Depression Bipolar anxiety   PATIENT STRENGTHS: Physically healthy Employed Family support   PROBLEM LIST: Problem List/Patient Goals Date to be addressed Date deferred Reason deferred Estimated date of resolution  depression 11/10/15   11/21/15  bipolar 11/10/15   11/21/15  anxiety 11/10/15   11/21/15  "have a support group" 11/10/15   11/21/15  "have more friends" 11/10/15   11/21/15                           DISCHARGE CRITERIA:  Free of depression,free of anxiety,support groups  PRELIMINARY DISCHARGE PLAN: Attend aftercare/continuing care group Return to previous living arrangement  PATIENT/FAMIILY INVOLVEMENT: This treatment plan has been presented to and reviewed with the patient, William Coleman., and/or family member . The patient and family have been given the opportunity to ask questions and make suggestions.  William Coleman 11/11/2015, 12:49 AM

## 2015-11-11 NOTE — [facility] Group Notes (Signed)
[place] Group Notes:  (Nursing/MHT/Case Management/Adjunct)  Date:  11/11/2015  Time:  9:28 AM  Type of Therapy:  goal setting  Participation Level:  Minimal  Participation Quality:  Appropriate and Attentive  Affect:  Appropriate  Cognitive:  Appropriate  Insight:  Appropriate  Engagement in Group:  Supportive  Modes of Intervention:  goal setting  Summary of Progress/Problems:  William Coleman 11/11/2015, 9:28 AM

## 2015-11-11 NOTE — [facility] Suicide Risk Assessment (Signed)
[facility] Admission Suicide Risk Assessment   Nursing information obtained from:  Patient Demographic factors:  Male Current Mental Status:  Suicidal ideation indicated by patient Loss Factors:  Loss of significant relationship, Financial problems / change in socioeconomic status Historical Factors:  Family history of mental illness or substance abuse Risk Reduction Factors:  Employed  Total Time spent with patient: 1 hour Principal Problem: Severe recurrent major depression without psychotic features ([place]) Diagnosis:   Patient Active Problem List   Diagnosis Date Noted  . Severe recurrent major depression without psychotic features ([place]) [F33.2] 11/10/2015  . Suicidal ideation [R45.851] 11/10/2015  . Allergic rhinitis [J30.9] 11/10/2014  . Affective bipolar disorder ([place]) [F31.9] 11/10/2014  . Clinical depression [F32.9] 11/10/2014  . Anxiety, generalized [F41.1] 11/10/2014  . HLD (hyperlipidemia) [E78.5] 11/10/2014  . BP (high blood pressure) [I10] 11/10/2014  . Adult hypothyroidism [E03.9] 11/10/2014  . 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 Obstructive apnea [G47.33] 11/10/2014   Subjective Data: 55 year old man admitted through the emergency room because of depression. Patient continues to endorse depressed mood and hopelessness helplessness. Passive suicidal thoughts. Denies any hallucinations or psychosis. Patient is extremely obsessively focused on his bowels. He will talk about his bowel movements in detail and he brings the topic around to discussing his bowels almost constantly. He gives me a lot of detail about how every medicine he has ever tried has caused some kind of side effect related to his bowel movements.  Continued Clinical Symptoms:  Alcohol Use Disorder Identification Test Final Score (AUDIT): 0 The "Alcohol Use Disorders Identification Test", Guidelines for Use in Primary Care, Second Edition.  World Pharmacologist  [facility]). Score between 0-7:  no or low risk or alcohol related problems. Score between 8-15:  moderate risk of alcohol related problems. Score between 16-19:  high risk of alcohol related problems. Score 20 or above:  warrants further diagnostic evaluation for alcohol dependence and treatment.   CLINICAL FACTORS:   Depression:   Hopelessness Obsessive-Compulsive Disorder   Musculoskeletal: Strength & Muscle Tone: within normal limits Gait & Station: normal Patient leans: N/A  Psychiatric Specialty Exam: Physical Exam  ROS  Blood pressure 152/99, pulse 75, temperature 97.7 F (36.5 C), temperature source Oral, resp. rate 16, height 5\' 7"  (1.702 m), weight 67.586 kg (149 lb), SpO2 100 %.Body mass index is 23.33 kg/(m^2).  General Appearance: Disheveled  Eye Contact:  Fair  Speech:  Slow  Volume:  Decreased  Mood:  Dysphoric  Affect:  Congruent  Thought Process:  Goal Directed  Orientation:  Full (Time, Place, and Person)  Thought Content:  Obsessions  Suicidal Thoughts:  Yes.  without intent/plan  Homicidal Thoughts:  No  Memory:  Immediate;   Good Recent;   Fair Remote;   Fair  Judgement:  Impaired  Insight:  Lacking  Psychomotor Activity:  Decreased  Concentration:  Concentration: Fair  Recall:  [employer] of Knowledge:  Fair  Language:  Fair  Akathisia:  No  Handed:  Right  AIMS (if indicated):     Assets:  Desire for Improvement Resilience  ADL's:  Intact  Cognition:  WNL  Sleep:  Number of Hours: 5      COGNITIVE FEATURES THAT CONTRIBUTE TO RISK:  Closed-mindedness    SUICIDE RISK:   Mild:  Suicidal ideation of limited frequency, intensity, duration, and specificity.  There are no identifiable plans, no associated intent, mild dysphoria and related symptoms, good self-control (both objective and subjective assessment),  few other risk factors, and identifiable protective factors, including available and accessible social support.  PLAN OF CARE: Admission  to psychiatric ward. Medication for depression and anxiety. 15 minute checks. Daily assessment of mood symptoms as well as suicidal ideation. Engagement a full treatment team and making sure he has appropriate outpatient treatment.  I certify that inpatient services furnished can reasonably be expected to improve the patient's condition.   Alethia Berthold, MD 11/11/2015, 6:31 PM

## 2015-11-11 NOTE — Plan of Care (Signed)
Problem: Coping: Goal: Ability to verbalize feelings will improve Outcome: Progressing Reports he is feeling better emotionally.

## 2015-11-11 NOTE — Progress Notes (Signed)
D: Patient is a 55 y.o.  year-old male admitted to [facility] ambulatory without difficulty. Patient is alert and oriented upon admission. A: Admission assessment completed without difficulty. Skin and contraband assessment completed   With [facility] with no skin abnormalities nor contraband found. Q.15 minute safety checks were implemented at the time of admission. Patient was oriented to the unit and escorted to room   313                                                      R: Patient was receptive to and cooperative with admission assessment. Patient contracts for safety on the unit at this time

## 2015-11-11 NOTE — H&P (Signed)
Psychiatric Admission Assessment Adult  Patient Identification: William Coleman. MRN:  [id number] Date of Evaluation:  11/11/2015 Chief Complaint:  Depression Principal Diagnosis: Severe recurrent major depression without psychotic features [facility]) Diagnosis:   Patient Active Problem List   Diagnosis Date Noted  . Severe recurrent major depression without psychotic features ([place]) [F33.2] 11/10/2015  . Suicidal ideation [R45.851] 11/10/2015  . Allergic rhinitis [J30.9] 11/10/2014  . Affective bipolar disorder ([place]) [F31.9] 11/10/2014  . Clinical depression [F32.9] 11/10/2014  . Anxiety, generalized [F41.1] 11/10/2014  . HLD (hyperlipidemia) [E78.5] 11/10/2014  . BP (high blood pressure) [I10] 11/10/2014  . Adult hypothyroidism [E03.9] 11/10/2014  . 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 Obstructive apnea [G47.33] 11/10/2014   History of Present Illness: Patient presents with depression which is chronic but worsening in the last few months. Passive suicidal thoughts. Increased hopelessness and helplessness. Poor sleep or energy poor focus. Currently not on any medicine for depression although he goes to the outpatient mental health clinic. Not abusing drugs or alcohol. Associated Signs/Symptoms: Depression Symptoms:  depressed mood, anhedonia, insomnia, psychomotor retardation, fatigue, feelings of worthlessness/guilt, (Hypo) Manic Symptoms:  None Anxiety Symptoms:  Excessive Worry, Psychotic Symptoms:  None PTSD Symptoms: Negative Total Time spent with patient: 1 hour  Past Psychiatric History: History of recurrent depression and anxiety and obsessive-compulsive disorder. Attempts and multiple medications in the past without any tolerance.  Is the patient at risk to self? Yes.    Has the patient been a risk to self in the past 6 months? Yes.    Has the patient been a risk to self within the distant past? No.  Is the patient a risk  to others? No.  Has the patient been a risk to others in the past 6 months? No.  Has the patient been a risk to others within the distant past? No.   Prior Inpatient Therapy:   Prior Outpatient Therapy:    Alcohol Screening: Patient refused Alcohol Screening Tool: Yes 1. How often do you have a drink containing alcohol?: Never 2. How many drinks containing alcohol do you have on a typical day when you are drinking?: 1 or 2 3. How often do you have six or more drinks on one occasion?: Never Preliminary Score: 0 9. Have you or someone else been injured as a result of your drinking?: No 10. Has a relative or friend or a doctor or another health worker been concerned about your drinking or suggested you cut down?: No Alcohol Use Disorder Identification Test Final Score (AUDIT): 0 Brief Intervention: AUDIT score less than 7 or less-screening does not suggest unhealthy drinking-brief intervention not indicated Substance Abuse History in the last 12 months:  No. Consequences of Substance Abuse: Negative Previous Psychotropic Medications: Yes  Psychological Evaluations: Yes  Past Medical History:  Past Medical History  Diagnosis Date  . Obsessive compulsive disorder   . Depression   . Hypertension   . Anginal pain ([place])   . Sleep apnea   . Hypothyroidism   . Bipolar disorder [facility])     Past Surgical History  Procedure Laterality Date  . Nasal septum surgery    . Mouth surgery      orthodontic surgery for underbite  . Myringotomy with tube placement     Family History:  Family History  Problem Relation Age of Onset  . Depression Mother   . Anxiety disorder Mother   . Congestive Heart Failure Father   . Prostate cancer  Father   . Hypertension Father    Family Psychiatric  History: Positive for anxiety disorder in the mother Tobacco Screening: @FLOW ([phone])::1)@ Social History:  History  Alcohol Use No    Comment: rarely     History  Drug Use No    Additional Social  History:      History of alcohol / drug use?: No history of alcohol / drug abuse                    Allergies:   Allergies  Allergen Reactions  . Amoxicillin   . Penicillins   . Sulfa Antibiotics    Lab Results:  Results for orders placed or performed during the hospital encounter of 11/10/15 (from the past 48 hour(s))  TSH     Status: None   Collection Time: 11/09/15  6:50 PM  Result Value Ref Range   TSH 1.123 0.350 - 4.500 uIU/mL    Blood Alcohol level:  Lab Results  Component Value Date   ETH <5 [id number]    Metabolic Disorder Labs:  Lab Results  Component Value Date   HGBA1C 5.5 06/19/2013   No results found for: PROLACTIN Lab Results  Component Value Date   CHOL 205* 06/19/2013   TRIG 94 06/19/2013   HDL 53 06/19/2013   LDLCALC 133 06/19/2013    Current Medications: Current Facility-Administered Medications  Medication Dose Route Frequency Provider Last Rate Last Dose  . acetaminophen (TYLENOL) tablet 650 mg  650 mg Oral Q6H PRN Gonzella Lex, MD   650 mg at 11/11/15 0946  . alum & mag hydroxide-simeth (MAALOX/MYLANTA) 200-200-20 MG/5ML suspension 30 mL  30 mL Oral Q4H PRN Gonzella Lex, MD      . escitalopram (LEXAPRO) tablet 5 mg  5 mg Oral Daily Gonzella Lex, MD      . levothyroxine (SYNTHROID, LEVOTHROID) tablet 25 mcg  25 mcg Oral QAC breakfast Gonzella Lex, MD   25 mcg at 11/11/15 0944  . losartan (COZAAR) tablet 50 mg  50 mg Oral Daily Gonzella Lex, MD   50 mg at 11/11/15 0945  . magnesium hydroxide (MILK OF MAGNESIA) suspension 30 mL  30 mL Oral Daily PRN Gonzella Lex, MD       PTA Medications: No prescriptions prior to admission    Musculoskeletal: Strength & Muscle Tone: within normal limits Gait & Station: normal Patient leans: N/A  Psychiatric Specialty Exam: Physical Exam  Nursing note and vitals reviewed. Constitutional: He appears well-developed and well-nourished.  HENT:  Head: Normocephalic and atraumatic.   Eyes: Conjunctivae are normal. Pupils are equal, round, and reactive to light.  Neck: Normal range of motion.  Cardiovascular: Normal heart sounds.   Respiratory: Effort normal.  GI: Soft.  Musculoskeletal: Normal range of motion.  Neurological: He is alert.  Skin: Skin is warm and dry.  Psychiatric: Thought content normal. His mood appears anxious. His speech is delayed. He is slowed. Cognition and memory are impaired. He expresses impulsivity. He exhibits a depressed mood.    Review of Systems  Constitutional: Negative.   HENT: Negative.   Eyes: Negative.   Respiratory: Negative.   Cardiovascular: Negative.   Gastrointestinal: Positive for diarrhea.  Musculoskeletal: Negative.   Skin: Negative.   Neurological: Negative.   Psychiatric/Behavioral: Positive for depression and suicidal ideas. Negative for hallucinations, memory loss and substance abuse. The patient is nervous/anxious and has insomnia.     Blood pressure 152/99, pulse 75, temperature 97.7 F (36.5  C), temperature source Oral, resp. rate 16, height 5\' 7"  (1.702 m), weight 67.586 kg (149 lb), SpO2 100 %.Body mass index is 23.33 kg/(m^2).  General Appearance: Disheveled  Eye Contact:  Minimal  Speech:  Slow  Volume:  Decreased  Mood:  Depressed and Dysphoric  Affect:  Constricted  Thought Process:  Goal Directed  Orientation:  Full (Time, Place, and Person)  Thought Content:  Obsessions  Suicidal Thoughts:  Yes.  without intent/plan  Homicidal Thoughts:  No  Memory:  Immediate;   Good Recent;   Fair Remote;   Fair  Judgement:  Impaired  Insight:  Shallow  Psychomotor Activity:  Decreased  Concentration:  Concentration: Fair  Recall:  [employer] of Knowledge:  Fair  Language:  Fair  Akathisia:  No  Handed:  Right  AIMS (if indicated):     Assets:  Desire for Improvement Housing Social Support  ADL's:  Intact  Cognition:  WNL  Sleep:  Number of Hours: 5       Treatment Plan Summary: Daily contact  with patient to assess and evaluate symptoms and progress in treatment, Medication management and Plan Continue 15 minute checks. Continued daily individual and group therapy and evaluation. I propose that we start medication trying to find something that will be tolerable. I suggest Lexapro at an extremely low dose of 5 mg. I will not be surprised if he still has some complaints of side effects. Patient is extraordinarily obsessively focused on his bowel movements. His ability to think more clearly about the big picture of improving his mood is limited. No other change to treatment plan today. His blood pressure is running a little high we will continue to watch that.  Observation Level/Precautions:  15 minute checks  Laboratory:  HbAIC  Psychotherapy:  Individual and group daily therapy   Medications:  Initiate Lexapro 5 mg per day   Consultations:  None   Discharge Concerns:  Outpatient treatment and safe living situation   Estimated LOS:4-5 days   Other:     I certify that inpatient services furnished can reasonably be expected to improve the patient's condition.    Alethia Berthold, MD 6/3/20176:33 PM

## 2015-11-11 NOTE — Plan of Care (Signed)
Problem: Coping: Goal: Ability to verbalize feelings will improve Outcome: Progressing Pt discusses with writer events that led to depression and frustration over being unable to find a medication that works for him. He rates his depression 9/10 this evening.   Problem: Safety: Goal: Periods of time without injury will increase Outcome: Progressing Pt remains free from harm.

## 2015-11-11 NOTE — BHH Group Notes (Signed)
[place] LCSW Group Therapy  11/11/2015 1:55 PM  Type of Therapy:  Group Therapy  Participation Level:  Minimal  Participation Quality:  Attentive  Affect:  Flat  Cognitive:  Alert  Insight:  Limited  Engagement in Therapy:  Limited  Modes of Intervention:  Discussion, Education, Socialization and Support  Summary of Progress/Problems: Pt will identify unhealthy thoughts and how they impact their emotions and behavior. Pt will be encouraged to discuss these thoughts, emotions and behaviors with the group. Pt attended group and stayed the entire time. He sat quietly and listened to other group members share.   [place] MSW, [place]  11/11/2015, 1:55 PM

## 2015-11-11 NOTE — Progress Notes (Signed)
Patient with depressed affect, cooperative behavior with meals, meds and plan of care. Patient tired and slightly anxious. No SI/HI at this time. Reports his daily goal is to work on plan of care, reports he feels better emotionally. Safety maintained.

## 2015-11-11 NOTE — Plan of Care (Signed)
Problem: Safety: Goal: Ability to disclose and discuss suicidal ideas will improve Outcome: Progressing Patient states he will let staff know if he is having thought to harm  Self ,he contracts for safety [place] RN

## 2015-11-12 ENCOUNTER — Encounter: Payer: Self-pay | Admitting: Psychiatry

## 2015-11-12 NOTE — Progress Notes (Signed)
2000: Patient visible in the milieu. Currently in the dayroom with staff and peers. Alert and oriented, pleasant and cooperative. Interacting positively with peers. A little anxious but minimizes his anxiety. Currently has no concern.  Staff continue to offer support and encouragements. Safety and security maintained.  2300: patient stayed in the dayroom with staff and peers. Had a snack and watched TV for a while. Currently in bed, sleeping. Safety and security maintained.  [id number]: Patient slept most of the night. Total sleep hours : 6.45. Staff continue to monitor.

## 2015-11-12 NOTE — BHH Suicide Risk Assessment (Signed)
[place] INPATIENT:  Family/Significant Other Suicide Prevention Education  Suicide Prevention Education:  Patient Refusal for Family/Significant Other Suicide Prevention Education: The patient William Coleman. has refused to provide written consent for family/significant other to be provided Family/Significant Other Suicide Prevention Education during admission and/or prior to discharge.  Physician notified.  [place] MSW, [place]  11/12/2015, 2:56 PM

## 2015-11-12 NOTE — Plan of Care (Signed)
Problem: Coping: Goal: Ability to verbalize frustrations and anger appropriately will improve Outcome: Progressing Patient reports that he is developing coping skills to deal with his situational anxiety.

## 2015-11-12 NOTE — BHH Group Notes (Signed)
[place] LCSW Group Therapy  11/12/2015 3:17 PM  Type of Therapy:  Group Therapy  Participation Level:  Minimal  Participation Quality:  Attentive  Affect:  Flat  Cognitive:  Alert  Insight:  Limited  Engagement in Therapy:  Limited  Modes of Intervention:  Discussion, Education, Socialization and Support  Summary of Progress/Problems: Emotional Regulation: Patients will identify both negative and positive emotions. They will discuss emotions they have difficulty regulating and how they impact their lives. Patients will be asked to identify healthy coping skills to combat unhealthy reactions to negative emotions.   Pt attended group and stayed the entire time. Pt sat quietly and listened to other group members share.   [place] MSW, [place]  11/12/2015, 3:17 PM

## 2015-11-12 NOTE — Plan of Care (Signed)
Problem: Coping: Goal: Ability to verbalize feelings will improve Outcome: Progressing Patient discussed his feelings with this Probation officer. Reported that his depression and anxiety "come and go" and are situational.

## 2015-11-12 NOTE — Progress Notes (Signed)
[facility] MD Progress Note  11/12/2015 6:49 PM William Coleman.  MRN:  [id number] Subjective:  Follow-up for 55 year old man with severe recurrent major depression and obsessive-compulsive disorder. He says his mood is a little better today. Denies suicidal ideation. Still very anxious. Still completely obsessed and focused on his bowel movements. He did take the Lexapro 5 mg and there is no clear change for the better or worse which is good. Blood pressure is a little bit up today. No other physical symptoms. Principal Problem: Severe recurrent major depression without psychotic features ([place]) Diagnosis:   Patient Active Problem List   Diagnosis Date Noted  . Severe recurrent major depression without psychotic features ([place]) [F33.2] 11/10/2015  . Suicidal ideation [R45.851] 11/10/2015  . Allergic rhinitis [J30.9] 11/10/2014  . HLD (hyperlipidemia) [E78.5] 11/10/2014  . HTN (hypertension) [I10] 11/10/2014  . Adult hypothyroidism [E03.9] 11/10/2014  . 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 Obstructive apnea [G47.33] 11/10/2014   Total Time spent with patient: 30 minutes  Past Psychiatric History: Past history of anxiety and depression. Poorly tolerated medicine in the past. Past Medical History:  Past Medical History  Diagnosis Date  . Obsessive compulsive disorder   . Depression   . Hypertension   . Anginal pain ([place])   . Sleep apnea   . Hypothyroidism   . Bipolar disorder [facility])     Past Surgical History  Procedure Laterality Date  . Nasal septum surgery    . Mouth surgery      orthodontic surgery for underbite  . Myringotomy with tube placement     Family History:  Family History  Problem Relation Age of Onset  . Depression Mother   . Anxiety disorder Mother   . Congestive Heart Failure Father   . Prostate cancer Father   . Hypertension Father    Family Psychiatric  History: Denies any family history of mental illness Social History:  History  Alcohol Use No     Comment: rarely     History  Drug Use No    Social History   Social History  . Marital Status: Single    Spouse Name: N/A  . Number of Children: N/A  . Years of Education: N/A   Social History Main Topics  . Smoking status: Never Smoker   . Smokeless tobacco: None  . Alcohol Use: No     Comment: rarely  . Drug Use: No  . Sexual Activity: No   Other Topics Concern  . None   Social History Narrative   Additional Social History:    History of alcohol / drug use?: No history of alcohol / drug abuse                    Sleep: Fair  Appetite:  Fair  Current Medications: Current Facility-Administered Medications  Medication Dose Route Frequency Provider Last Rate Last Dose  . acetaminophen (TYLENOL) tablet 650 mg  650 mg Oral Q6H PRN Gonzella Lex, MD   650 mg at 11/12/15 0949  . alum & mag hydroxide-simeth (MAALOX/MYLANTA) 200-200-20 MG/5ML suspension 30 mL  30 mL Oral Q4H PRN Gonzella Lex, MD      . escitalopram (LEXAPRO) tablet 5 mg  5 mg Oral Daily Gonzella Lex, MD   5 mg at 11/12/15 0907  . levothyroxine (SYNTHROID, LEVOTHROID) tablet 25 mcg  25 mcg Oral QAC breakfast Gonzella Lex, MD   25 mcg at 11/12/15 [phone]  . losartan (COZAAR) tablet 50 mg  50 mg Oral Daily Gonzella Lex, MD   50 mg at 11/12/15 0907  . magnesium hydroxide (MILK OF MAGNESIA) suspension 30 mL  30 mL Oral Daily PRN Gonzella Lex, MD        Lab Results: No results found for this or any previous visit (from the past 48 hour(s)).  Blood Alcohol level:  Lab Results  Component Value Date   ETH <5 11/09/2015    Physical Findings: AIMS: Facial and Oral Movements Muscles of Facial Expression: None, normal Lips and Perioral Area: None, normal Jaw: None, normal Tongue: None, normal,Extremity Movements Upper (arms, wrists, hands, fingers): None, normal Lower (legs, knees, ankles, toes): None, normal, Trunk Movements Neck, shoulders, hips: None, normal, Overall Severity Severity of  abnormal movements (highest score from questions above): None, normal Incapacitation due to abnormal movements: None, normal Patient's awareness of abnormal movements (rate only patient's report): No Awareness, Dental Status Current problems with teeth and/or dentures?: No Does patient usually wear dentures?: No  CIWA:    COWS:  COWS Total Score: 0  Musculoskeletal: Strength & Muscle Tone: within normal limits Gait & Station: normal Patient leans: N/A  Psychiatric Specialty Exam: Physical Exam  Nursing note and vitals reviewed. Constitutional: He appears well-developed and well-nourished.  HENT:  Head: Normocephalic and atraumatic.  Eyes: Conjunctivae are normal. Pupils are equal, round, and reactive to light.  Neck: Normal range of motion.  Cardiovascular: Regular rhythm and normal heart sounds.   Respiratory: Effort normal.  GI: Soft.  Musculoskeletal: Normal range of motion.  Neurological: He is alert.  Skin: Skin is warm and dry.  Psychiatric: His behavior is normal. Judgment and thought content normal. His mood appears anxious. His speech is tangential. Cognition and memory are normal.    Review of Systems  Constitutional: Negative.   HENT: Negative.   Eyes: Negative.   Respiratory: Negative.   Cardiovascular: Negative.   Gastrointestinal: Positive for diarrhea.  Musculoskeletal: Negative.   Skin: Negative.   Neurological: Negative.   Psychiatric/Behavioral: Positive for depression. Negative for suicidal ideas, hallucinations, memory loss and substance abuse. The patient is nervous/anxious. The patient does not have insomnia.     Blood pressure 150/89, pulse 66, temperature 97.9 F (36.6 C), temperature source Oral, resp. rate 18, height 5\' 7"  (1.702 m), weight 67.586 kg (149 lb), SpO2 100 %.Body mass index is 23.33 kg/(m^2).  General Appearance: Disheveled  Eye Contact:  Fair  Speech:  Clear and Coherent  Volume:  Decreased  Mood:  Anxious  Affect:  Congruent   Thought Process:  Goal Directed  Orientation:  Full (Time, Place, and Person)  Thought Content:  Logical  Suicidal Thoughts:  No  Homicidal Thoughts:  No  Memory:  Immediate;   Fair Recent;   Fair Remote;   Fair  Judgement:  Fair  Insight:  Fair  Psychomotor Activity:  Decreased  Concentration:  Concentration: Fair  Recall:  [employer] of Knowledge:  Fair  Language:  Fair  Akathisia:  No  Handed:  Right  AIMS (if indicated):     Assets:  Desire for Improvement Housing Resilience  ADL's:  Intact  Cognition:  WNL  Sleep:  Number of Hours: 7     Treatment Plan Summary: Medication management and Plan Continue current medicine. I won't advance his Lexapro. Supportive counseling. Encourage patient to discuss discharge planning and follow-up with his regular psychiatric team tomorrow. No change to antihypertensive medicine for today.  Alethia Berthold, MD 11/12/2015, 6:49 PM

## 2015-11-12 NOTE — Progress Notes (Signed)
D: Pt is seen in the milieu interacting appropriately with staff and peers. Pt affect is depressed. He discusses with Probation officer his stressors, stating that his major stressor is "not being able to get in a relationship after being broken up with 3 years ago." Pt rates depression 9/10. He denies SI/HI/AVH at this time. Denies pain.  A: Emotional support and encouragement provided. Medications administered with education. q15 minute safety checks maintained. R: Pt remains free from harm. Will continue to monitor.

## 2015-11-12 NOTE — BHH Counselor (Signed)
Adult Comprehensive Assessment  Patient ID: William Weisel., male   DOB: Oct 29, 1960, 55 y.o.   MRN: [id number]  Information Source: Information source: Patient  Current Stressors:  Educational / Learning stressors: None reported  Employment / Job issues: Pt is employed but cannot keep up with fast pace and physical requirements  Family Relationships: None reported  Museum/gallery curator / Lack of resources (include bankruptcy): Limited income.  Housing / Lack of housing: Pt lives alone Physical health (include injuries & life threatening diseases): None reported  Social relationships: Pt reports he is lonely because he has not had a girlfriend in 3 years  Substance abuse: Denies use.  Bereavement / Loss: Pt's father died 1 year ago, mother died 3 years ago.   Living/Environment/Situation:  Living Arrangements: Alone Living conditions (as described by patient or guardian): Comfortable  How long has patient lived in current situation?: Jan. 1999 What is atmosphere in current home: Comfortable  Family History:  Are you sexually active?: No What is your sexual orientation?: Heterosexual  Has your sexual activity been affected by drugs, alcohol, medication, or emotional stress?: None reported  Does patient have children?: No  Childhood History:  By whom was/is the patient raised?: Both parents Description of patient's relationship with caregiver when they were a child: "excellent"  Patient's description of current relationship with people who raised him/her: Parents passed away.  How were you disciplined when you got in trouble as a child/adolescent?: Mostly verbal Does patient have siblings?: Yes Number of Siblings: 1 Description of patient's current relationship with siblings: Sister; close but does not want her to know he is hospitalzied.  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 broker?: No Learning disability?: No  Employment/Work Situation:   Employment situation: Employed Where is patient currently employed?: [employer] long has patient been employed?: Since June 2015 Patient's job has been impacted by current illness: No What is the longest time patient has a held a job?: 4-5 years  Where was the patient employed at that time?: [employer]  Has patient ever been in the [employer]?: No  Financial Resources:   Museum/gallery curator resources: Income from employment, Private insurance Does patient have a representative payee or guardian?: No  Alcohol/Substance Abuse:   What has been your use of drugs/alcohol within the last 12 months?: Denies use.  Alcohol/Substance Abuse Treatment Hx: Denies past history Has alcohol/substance abuse ever caused legal problems?: No  Social Support System:   Patient's Community Support System: Good Describe Community Support System: famliy Type of faith/religion: NA How does patient's faith help to cope with current illness?: NA   Leisure/Recreation:   Leisure and Hobbies: "none really."   Strengths/Needs:   What things does the patient do well?: "I dont know"  In what areas does patient struggle / problems for patient: depression, anxiety   Discharge Plan:   Does patient have access to transportation?: Yes Will patient be returning to same living situation after discharge?: Yes Currently receiving community mental health services: Yes (From Whom) ([place]) Does patient have financial barriers related to discharge medications?: No  Summary/Recommendations:    Patient  is a 55 year old male admitted  with a diagnosis  of Major Depression. Patient presented to the hospital with depression, anxiety and SI. Patient reports primary triggers for admission were the lack of social relationships, and problems at job. . Patient will benefit from crisis stabilization, medication evaluation, group therapy and psycho education in addition to case management for discharge. At discharge, it is recommended that patient remain compliant with established discharge plan and continued treatment.   [place]. MSW, LCSWA  11/12/2015

## 2015-11-12 NOTE — BHH Group Notes (Signed)
[facility] Group Notes:  (Nursing/MHT/Case Management/Adjunct)  Date:  11/12/2015  Time:  1:48 AM  Type of Therapy:  Group Therapy  Participation Level:  Active  Participation Quality:  Appropriate  Affect:  Appropriate  Cognitive:  Appropriate  Insight:  Appropriate  Engagement in Group:  Engaged  Modes of Intervention:  Discussion  Summary of Progress/Problems:  Kandis Fantasia 11/12/2015, 1:48 AM

## 2015-11-13 ENCOUNTER — Telehealth: Payer: Self-pay | Admitting: Family Medicine

## 2015-11-13 MED ORDER — FLUVOXAMINE MALEATE 50 MG PO TABS
50.0000 mg | ORAL_TABLET | Freq: Every day | ORAL | Status: DC
  Administered 2015-11-13: 50 mg via ORAL
  Filled 2015-11-13: qty 1

## 2015-11-13 NOTE — Telephone Encounter (Signed)
William Coleman pt sister is requesting a call back from [place] or Dr Rosanna Randy.  Pt sister states pt had been missing since Thursday and when they contacted the police they found out pt has been admitted to [facility].  William Coleman is asking if we have any information on why he was admitted.  [id number]

## 2015-11-13 NOTE — Progress Notes (Signed)
Recreation Therapy Notes  INPATIENT RECREATION THERAPY ASSESSMENT  Patient Details Name: William Coleman. MRN: [id number] DOB: [birth year] Today's Date: 11/13/2015  Patient Stressors: Relationship, Death, Friends, Work, Other (Comment) Broke up with girlfriend and hasn't found anyone else; family members have died recently; lack of supportive friends; works at a job that is fast paced and physically demanding - wants a job that is slower paced and not as physically demanding; being lonely; life is not getting better; things that should make him happy do not; dad's house is on the market and patient has to do the yardwork.  Coping Skills:   Talking, Music, Sports, Other (Comment) (Read)  Personal Challenges: Anger, Decision-Making, Expressing Yourself, Problem-Solving, Relationships, Self-Esteem/Confidence, Social Interaction, Stress Management, Time Management, Work Midwife (2+):  Individual - TV, Individual - Other (Comment) (Movies, walking)  Awareness of Community Resources:  Yes  Community Resources:  Park, Other (Comment) (Walking track)  Current Use: No  If no, Barriers?: Other (Comment) (Time)  Patient Strengths:  Surveyor, mining, trustworthy, good character  Patient Identified Areas of Improvement:  Relationship - getting a girlfriend; get a better job; get better [employer], spiritually, and emotionally  Current Recreation Participation:  Watch TV, get an computer  Patient Goal for Hospitalization:  To get out and get better  [place] of Residence:  [place] of Residence:  [place]   Current SI (including self-harm):  No  Current HI:  No  Consent to Intern Participation: N/A   Leonette Monarch, LRT/CTRS 11/13/2015, 2:38 PM

## 2015-11-13 NOTE — Progress Notes (Signed)
[facility] MD Progress Note  11/13/2015 11:28 AM William Coleman.  MRN:  [id number]  Subjective:  William Coleman continues to be depressed, anxious and suicidal. He is preoccupied with his bowel movements and we spent the entire interview discussing the consistency shape and color of his poop. He is very reluctant to take any medications because they caused sexual side effects, even though he has not been sexually active in the past 3 years. He also believes that medication change the volume of his bowel movement making them larger and looser. He spends hours wiping his bottom. Sleep and appetite are okay. He was started on 5 mg of Lexapro and seems to tolerate it well. Even though he has been in the medical psychiatry he has not taken any medications since 2007.  Principal Problem: Severe recurrent major depression without psychotic features ([place]) Diagnosis:   Patient Active Problem List   Diagnosis Date Noted  . Severe recurrent major depression without psychotic features ([place]) [F33.2] 11/10/2015  . Suicidal ideation [R45.851] 11/10/2015  . Allergic rhinitis [J30.9] 11/10/2014  . HLD (hyperlipidemia) [E78.5] 11/10/2014  . HTN (hypertension) [I10] 11/10/2014  . Adult hypothyroidism [E03.9] 11/10/2014  . 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 Obstructive apnea [G47.33] 11/10/2014   Total Time spent with patient: 20 minutes  Past Psychiatric History: Depression and OCD.  Past Medical History:  Past Medical History  Diagnosis Date  . Obsessive compulsive disorder   . Depression   . Hypertension   . Anginal pain ([place])   . Sleep apnea   . Hypothyroidism   . Bipolar disorder [facility])     Past Surgical History  Procedure Laterality Date  . Nasal septum surgery    . Mouth surgery      orthodontic surgery for underbite  . Myringotomy with tube placement     Family History:  Family History  Problem Relation Age of Onset  . Depression Mother   . Anxiety disorder Mother   . Congestive Heart  Failure Father   . Prostate cancer Father   . Hypertension Father    Family Psychiatric  History: See H&P. Social History:  History  Alcohol Use No    Comment: rarely     History  Drug Use No    Social History   Social History  . Marital Status: Single    Spouse Name: N/A  . Number of Children: N/A  . Years of Education: N/A   Social History Main Topics  . Smoking status: Never Smoker   . Smokeless tobacco: None  . Alcohol Use: No     Comment: rarely  . Drug Use: No  . Sexual Activity: No   Other Topics Concern  . None   Social History Narrative   Additional Social History:    History of alcohol / drug use?: No history of alcohol / drug abuse                    Sleep: Fair  Appetite:  Fair  Current Medications: Current Facility-Administered Medications  Medication Dose Route Frequency Provider Last Rate Last Dose  . acetaminophen (TYLENOL) tablet 650 mg  650 mg Oral Q6H PRN Gonzella Lex, MD   650 mg at 11/12/15 0949  . alum & mag hydroxide-simeth (MAALOX/MYLANTA) 200-200-20 MG/5ML suspension 30 mL  30 mL Oral Q4H PRN Gonzella Lex, MD      . escitalopram (LEXAPRO) tablet 5 mg  5 mg Oral Daily Gonzella Lex, MD  5 mg at 11/13/15 0840  . fluvoxaMINE (LUVOX) tablet 50 mg  50 mg Oral QHS Nyquan Selbe B Staria Birkhead, MD      . levothyroxine (SYNTHROID, LEVOTHROID) tablet 25 mcg  25 mcg Oral QAC breakfast Gonzella Lex, MD   25 mcg at 11/13/15 0645  . losartan (COZAAR) tablet 50 mg  50 mg Oral Daily Gonzella Lex, MD   50 mg at 11/13/15 0840  . magnesium hydroxide (MILK OF MAGNESIA) suspension 30 mL  30 mL Oral Daily PRN Gonzella Lex, MD        Lab Results: No results found for this or any previous visit (from the past 48 hour(s)).  Blood Alcohol level:  Lab Results  Component Value Date   ETH <5 11/09/2015    Physical Findings: AIMS: Facial and Oral Movements Muscles of Facial Expression: None, normal Lips and Perioral Area: None, normal Jaw:  None, normal Tongue: None, normal,Extremity Movements Upper (arms, wrists, hands, fingers): None, normal Lower (legs, knees, ankles, toes): None, normal, Trunk Movements Neck, shoulders, hips: None, normal, Overall Severity Severity of abnormal movements (highest score from questions above): None, normal Incapacitation due to abnormal movements: None, normal Patient's awareness of abnormal movements (rate only patient's report): No Awareness, Dental Status Current problems with teeth and/or dentures?: No Does patient usually wear dentures?: No  CIWA:    COWS:  COWS Total Score: 0  Musculoskeletal: Strength & Muscle Tone: within normal limits Gait & Station: normal Patient leans: N/A  Psychiatric Specialty Exam: Physical Exam  Nursing note and vitals reviewed.   Review of Systems  Gastrointestinal: Positive for diarrhea and constipation.  Psychiatric/Behavioral: The patient is nervous/anxious.   All other systems reviewed and are negative.   Blood pressure 169/108, pulse 80, temperature 97.9 F (36.6 C), temperature source Oral, resp. rate 18, height 5\' 7"  (1.702 m), weight 67.586 kg (149 lb), SpO2 100 %.Body mass index is 23.33 kg/(m^2).  General Appearance: Casual  Eye Contact:  Minimal  Speech:  Clear and Coherent  Volume:  Normal  Mood:  Anxious  Affect:  Blunt  Thought Process:  Goal Directed  Orientation:  Full (Time, Place, and Person)  Thought Content:  Illogical  Suicidal Thoughts:  Yes.  with intent/plan  Homicidal Thoughts:  No  Memory:  Immediate;   Fair Recent;   Fair Remote;   Fair  Judgement:  Poor  Insight:  Lacking  Psychomotor Activity:  Normal  Concentration:  Concentration: Fair and Attention Span: Fair  Recall:  [employer] of Knowledge:  Fair  Language:  Fair  Akathisia:  No  Handed:  Right  AIMS (if indicated):     Assets:  Communication Skills Desire for Improvement Financial Resources/Insurance Housing Physical Health Resilience Social  Support Talents/Skills Transportation Vocational/Educational  ADL's:  Intact  Cognition:  WNL  Sleep:  Number of Hours: 6.45     Treatment Plan Summary: Daily contact with patient to assess and evaluate symptoms and progress in treatment and Medication management   William Coleman is a 55 year old male with a history of depression and anxiety admitted for worsening of depression and suicidal ideation with a plan to step in front of the traffic.  1. Suicidal ideation. The patient is able to contract for safety in the hospital.  2. Depression and anxiety. He was started on Lexapro 5 mg on Saturday. We will Luvox tonight to address OCD symptoms. We discussed starting Risperdal as well for augmentation.  3. Hypothyroidism. He is on Synthroid.  4. Hypertension. His blood pressure has been elevated he was restarted on Cozaar.  5. Metabolic syndromes screening. I will order a lipid profile, TSH, hemoglobin A1c and prolactin as he might end up on antipsychotic.  6. Disposition. He will be discharged to home. He will follow up with [name] at [facility].  Orson Slick, MD 11/13/2015, 11:28 AM

## 2015-11-13 NOTE — Telephone Encounter (Signed)
Jane-sister is on release of information to receive information on patient, advised Opal Sidles that we can see some records but some are not available due to him been in behavioral health unit which she already new due to the police finding that out and telling the sister. Opal Sidles wanted Dr. Rosanna Randy to know that she called and why. She was very worried before finding out where he was because it was not like him to not answer for days and not go to work.-aa

## 2015-11-13 NOTE — Plan of Care (Signed)
Problem: Education: Goal: Knowledge of the prescribed therapeutic regimen will improve Outcome: Progressing Pt asks questions appropriately regarding medications and verbalizes understanding.  Problem: Self-Concept: Goal: Ability to disclose and discuss suicidal ideas will improve Outcome: Progressing Pt denies SI at this time. He rates depression 7/10 and discusses how he is trying to "be happier", including going to groups and taking his medications.

## 2015-11-13 NOTE — Progress Notes (Signed)
Recreation Therapy Notes  Date: 06.05.17 Time: 1:00 pm Location: Craft Room  Group Topic: Wellness  Goal Area(s) Addresses:  Patient will identify at least one item per dimension of health Patient will examine areas they are deficient in.  Behavioral Response: Attentive  Intervention: 6 Dimensions of Health  Activity: Patients were given a definition sheet of the 6 Dimensions of Health and a worksheet. Patients were instructed to write 2-3 things in each category.  Education: LRT educated patients on how this activity relates to their admission and d/c.  Education Outcome: Acknowledges education/In group clarification offered  Clinical Observations/Feedback: Patient completed activity by writing at least 2 items in each category. Patient did not contribute to group discussion.  Leonette Monarch, LRT/CTRS 11/13/2015 2:29 PM

## 2015-11-13 NOTE — Telephone Encounter (Signed)
For severe depression. I think he'll be in therefor  another few days.

## 2015-11-13 NOTE — Progress Notes (Addendum)
Patient with sad affect, cooperative behavior with meals, meds and plan of care. No SI/HI at this time. Therapy groups encouraged and safety maintained. Family member phones unit, with no security code requesting information. Contact information acquired from family member and relayed to patient. Patient does not call family member at this time. Safety maintained.

## 2015-11-14 LAB — TSH: TSH: 1.388 u[IU]/mL (ref 0.350–4.500)

## 2015-11-14 LAB — LIPID PANEL
Cholesterol: 292 mg/dL — ABNORMAL HIGH (ref 0–200)
HDL: 47 mg/dL (ref >40)
LDL CALC: 196 mg/dL — AB (ref 0–99)
Total CHOL/HDL Ratio: 6.2 RATIO
Triglycerides: 247 mg/dL — ABNORMAL HIGH (ref <150)
VLDL: 49 mg/dL — AB (ref 0–40)

## 2015-11-14 LAB — HEMOGLOBIN A1C: Hgb A1c MFr Bld: 5.1 % (ref 4.0–6.0)

## 2015-11-14 MED ORDER — FLUVOXAMINE MALEATE 50 MG PO TABS
100.0000 mg | ORAL_TABLET | Freq: Every day | ORAL | Status: DC
  Administered 2015-11-14 – 2015-11-16 (×3): 100 mg via ORAL
  Filled 2015-11-14 (×3): qty 2

## 2015-11-14 NOTE — Progress Notes (Signed)
D: Pt appears anxious this evening. He is seen in the milieu interacting appropriately with staff and peers. Pt states that his goal for today was "to feel happier." He explains that he worked on this by "going to groups and taking my medications." Denies SI/HI/AVH at this time. Denies pain. Pt rates depression 7/10 and anxiety 2/10. A: Emotional support and encouragement provided. Medications administered with education. Pt educated on finding activities that he enjoys doing in order to cope with depression. q15 minute safety checks maintained. R: Pt remains free from harm. Will continue to monitor.

## 2015-11-14 NOTE — Progress Notes (Signed)
No complaints noted. Pt requested information on the year that Luvox came out on. Med and group compliant. Appropriate with staff and peers. No negative behaviors. Denies SI, HI, AVH. Endorses mild depression and anxiety. Will continue to assess and monitor for safety. Pt remains safe on unit with q 15 min checks.

## 2015-11-14 NOTE — Progress Notes (Signed)
Recreation Therapy Notes  Date: 06.06.17 Time: 3:00 pm Location: Craft Room  Group Topic: Self-expression  Goal Area(s) Addresses:  Patient will be able to identify a color that represents each emotion. Patient will verbalize benefit of using art as a means of self-expression. Patient will verbalize one emotion experienced while participating in activity.  Behavioral Response: Attentive  Intervention: The Colors Within Me  Activity: Patients were given a blank face worksheet and instructed to pick a color for each emotion they were experiencing and show on the face how much of that emotion they were experiencing.  Education: LRT educated patients on other forms of self-expression.  Education Outcome: Acknowledges education/In group clarification offered   Clinical Observations/Feedback: Patient completed activity by picking a color for each emotion and showing how much of the emotion he was feeling. Patient did not contribute to group discussion.  Leonette Monarch, LRT/CTRS 11/14/2015 4:06 PM

## 2015-11-14 NOTE — Progress Notes (Signed)
[facility] MD Progress Note  11/14/2015 1:37 PM [name].  MRN:  [id number]  Subjective:  Mr. Sedeno reports some improvement. His mood is improving but he still feels suicidal. His anxiety is much better. He appears to tolerate medications well including Luvox for OCD symptoms. He is still grossly preoccupied with his bowel movements. He has no somatic complaints. He participates in programming.  Principal Problem: Severe recurrent major depression without psychotic features ([place]) Diagnosis:   Patient Active Problem List   Diagnosis Date Noted  . Severe recurrent major depression without psychotic features ([place]) [F33.2] 11/10/2015  . Suicidal ideation [R45.851] 11/10/2015  . Allergic rhinitis [J30.9] 11/10/2014  . HLD (hyperlipidemia) [E78.5] 11/10/2014  . HTN (hypertension) [I10] 11/10/2014  . Adult hypothyroidism [E03.9] 11/10/2014  . 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 Obstructive apnea [G47.33] 11/10/2014   Total Time spent with patient: 20 minutes  Past Psychiatric History: Depression, OCD.  Past Medical History:  Past Medical History  Diagnosis Date  . Obsessive compulsive disorder   . Depression   . Hypertension   . Anginal pain ([place])   . Sleep apnea   . Hypothyroidism   . Bipolar disorder [facility])     Past Surgical History  Procedure Laterality Date  . Nasal septum surgery    . Mouth surgery      orthodontic surgery for underbite  . Myringotomy with tube placement     Family History:  Family History  Problem Relation Age of Onset  . Depression Mother   . Anxiety disorder Mother   . Congestive Heart Failure Father   . Prostate cancer Father   . Hypertension Father    Family Psychiatric  History: See H&P. Social History:  History  Alcohol Use No    Comment: rarely     History  Drug Use No    Social History   Social History  . Marital Status: Single    Spouse Name: N/A  . Number of Children: N/A  . Years of Education: N/A   Social History  Main Topics  . Smoking status: Never Smoker   . Smokeless tobacco: None  . Alcohol Use: No     Comment: rarely  . Drug Use: No  . Sexual Activity: No   Other Topics Concern  . None   Social History Narrative   Additional Social History:    History of alcohol / drug use?: No history of alcohol / drug abuse                    Sleep: Fair  Appetite:  Fair  Current Medications: Current Facility-Administered Medications  Medication Dose Route Frequency Provider Last Rate Last Dose  . acetaminophen (TYLENOL) tablet 650 mg  650 mg Oral Q6H PRN Gonzella Lex, MD   650 mg at 11/14/15 0656  . alum & mag hydroxide-simeth (MAALOX/MYLANTA) 200-200-20 MG/5ML suspension 30 mL  30 mL Oral Q4H PRN Gonzella Lex, MD      . escitalopram (LEXAPRO) tablet 5 mg  5 mg Oral Daily Gonzella Lex, MD   5 mg at 11/14/15 0935  . fluvoxaMINE (LUVOX) tablet 50 mg  50 mg Oral QHS Clovis Fredrickson, MD   50 mg at 11/13/15 2143  . levothyroxine (SYNTHROID, LEVOTHROID) tablet 25 mcg  25 mcg Oral QAC breakfast Gonzella Lex, MD   25 mcg at 11/14/15 [phone]  . losartan (COZAAR) tablet 50 mg  50 mg Oral Daily Gonzella Lex, MD  50 mg at 11/14/15 0935  . magnesium hydroxide (MILK OF MAGNESIA) suspension 30 mL  30 mL Oral Daily PRN Gonzella Lex, MD        Lab Results:  Results for orders placed or performed during the hospital encounter of 11/10/15 (from the past 48 hour(s))  Lipid panel     Status: Abnormal   Collection Time: 11/14/15  6:40 AM  Result Value Ref Range   Cholesterol 292 (H) 0 - 200 mg/dL   Triglycerides 247 (H) <150 mg/dL   HDL 47 >40 mg/dL   Total CHOL/HDL Ratio 6.2 RATIO   VLDL 49 (H) 0 - 40 mg/dL   LDL Cholesterol 196 (H) 0 - 99 mg/dL    Comment:        Total Cholesterol/HDL:CHD Risk Coronary Heart Disease Risk Table                     Men   Women  1/2 Average Risk   3.4   3.3  Average Risk       5.0   4.4  2 X Average Risk   9.6   7.1  3 X Average Risk  23.4   11.0         Use the calculated Patient Ratio above and the CHD Risk Table to determine the patient's CHD Risk.        ATP III CLASSIFICATION (LDL):  <100     mg/dL   Optimal  100-129  mg/dL   Near or Above                    Optimal  130-159  mg/dL   Borderline  160-189  mg/dL   High  >190     mg/dL   Very High   TSH     Status: None   Collection Time: 11/14/15  6:40 AM  Result Value Ref Range   TSH 1.388 0.350 - 4.500 uIU/mL    Blood Alcohol level:  Lab Results  Component Value Date   ETH <5 11/09/2015    Physical Findings: AIMS: Facial and Oral Movements Muscles of Facial Expression: None, normal Lips and Perioral Area: None, normal Jaw: None, normal Tongue: None, normal,Extremity Movements Upper (arms, wrists, hands, fingers): None, normal Lower (legs, knees, ankles, toes): None, normal, Trunk Movements Neck, shoulders, hips: None, normal, Overall Severity Severity of abnormal movements (highest score from questions above): None, normal Incapacitation due to abnormal movements: None, normal Patient's awareness of abnormal movements (rate only patient's report): No Awareness, Dental Status Current problems with teeth and/or dentures?: No Does patient usually wear dentures?: No  CIWA:    COWS:  COWS Total Score: 0  Musculoskeletal: Strength & Muscle Tone: within normal limits Gait & Station: normal Patient leans: N/A  Psychiatric Specialty Exam: Physical Exam  Nursing note and vitals reviewed.   Review of Systems  Psychiatric/Behavioral: Positive for depression. The patient is nervous/anxious.   All other systems reviewed and are negative.   Blood pressure 155/96, pulse 66, temperature 98 F (36.7 C), temperature source Oral, resp. rate 18, height 5\' 7"  (1.702 m), weight 67.586 kg (149 lb), SpO2 100 %.Body mass index is 23.33 kg/(m^2).  General Appearance: Casual  Eye Contact:  Good  Speech:  Clear and Coherent  Volume:  Normal  Mood:  Depressed  Affect:   Appropriate  Thought Process:  Goal Directed  Orientation:  Full (Time, Place, and Person)  Thought Content:  WDL  Suicidal  Thoughts:  Yes.  with intent/plan  Homicidal Thoughts:  No  Memory:  Immediate;   Fair Recent;   Fair Remote;   Fair  Judgement:  Fair  Insight:  Fair  Psychomotor Activity:  Normal  Concentration:  Concentration: Fair and Attention Span: Fair  Recall:  [employer] of Knowledge:  Fair  Language:  Fair  Akathisia:  No  Handed:  Right  AIMS (if indicated):     Assets:  Communication Skills Desire for Improvement Financial Resources/Insurance Housing Physical Health Resilience Social Support  ADL's:  Intact  Cognition:  WNL  Sleep:  Number of Hours: 6.5     Treatment Plan Summary: Daily contact with patient to assess and evaluate symptoms and progress in treatment and Medication management   Mr. milligrams is a 55 year old male with a history of depression and anxiety admitted for worsening of depression and suicidal ideation with a plan to step in front of the traffic.  1. Suicidal ideation. The patient is able to contract for safety in the hospital.  2. Depression and anxiety. He was started on Lexapro 5 mg on Saturday. We will increase Luvox tonight to 100 mg to address OCD symptoms. We discussed starting Risperdal as well for augmentation.  3. Hypothyroidism. He is on Synthroid.  4. Hypertension. His blood pressure has been elevated he was restarted on Cozaar. BP is still elevated.   5. Metabolic syndromes screening. Llipid profile and TSH are normal, hemoglobin A1c and prolactin are pending.   6. Disposition. He will be discharged to home. He will follow up with [name] at [facility].  Orson Slick, MD 11/14/2015, 1:37 PM

## 2015-11-14 NOTE — Plan of Care (Signed)
Problem: [facility] Participation in Recreation Therapeutic Interventions Goal: STG-Patient will demonstrate improved self esteem by identif STG: Self-Esteem - Within 4 treatment session, patient will verbalize at least 5 positive affirmation statements in each of 2 treatment sessions to increase self-esteem post d/c.  Outcome: Progressing Treatment Session 1; Completed 1 out of 2: At approximately 12:50 pm, LRT met with patient in craft room. Patient verbalized 5 positive affirmation statements. When asked how it felt, patient stated, "not much". LRT encouraged patient to continue saying positive affirmation statements.  Leonette Monarch, LRT/CTRS 06.06.17 1:51 pm Goal: STG-Other Recreation Therapy Goal (Specify) STG: Stress Management - Within 4 treatment sessions, patient will verbalize understanding of the stress management techniques in each of 2 treatment sessions to increase stress management skills post d/c.  Outcome: Progressing Treatment Session 1; Completed 1 out of 2: At approximately 12:50 pm, LRT met with patient in craft room. LRT educated and provided patient with handouts on stress management techniques. Patient verbalized understanding. LRT encouraged patient to read over and practice the stress management techniques.  Leonette Monarch, LRT/CTRS 06.06.17 1:52 pm

## 2015-11-15 LAB — PROLACTIN: Prolactin: 6.8 ng/mL (ref 4.0–15.2)

## 2015-11-15 MED ORDER — HYDROCHLOROTHIAZIDE 25 MG PO TABS
25.0000 mg | ORAL_TABLET | Freq: Every day | ORAL | Status: DC
  Administered 2015-11-15 – 2015-11-17 (×3): 25 mg via ORAL
  Filled 2015-11-15 (×3): qty 1

## 2015-11-15 NOTE — Plan of Care (Signed)
Problem: Self-Concept: Goal: Level of anxiety will decrease Outcome: Progressing Pt reports decreased anxiety. He rates anxiety 2/10.  Problem: Education: Goal: Verbalization of understanding the information provided will improve Outcome: Progressing Pt verbalizes understanding of education regarding medications and condition.

## 2015-11-15 NOTE — Progress Notes (Signed)
D: Pt spends most of the evening in the milieu. He appears less anxious and rates his anxiety 2/10. Pt rates depression 8/10. Denies SI/HI/AVH at this time. Denies pain. Pt continues to report that he has been working towards his goal of "feeling happier" by attending groups and taking his medications. A: Emotional support and encouragement provided. Medications administered with education. q15 minute safety checks maintained. R: Pt remains free from harm. Will continue to monitor.

## 2015-11-15 NOTE — Progress Notes (Signed)
Recreation Therapy Notes  Date: 06.07.17 Time: 1:00 pm Location: Craft Room  Group Topic: Self-esteem  Goal Area(s) Addresses:  Patient will write at least one positive trait about self. Patient will verbalize benefit of having a healthy self-esteem.  Behavioral Response: Attentive, Interactive  Intervention: I Am  Activity: Patients were given a worksheet with the letter I on it and instructed to write as many positive traits inside the letter.  Education: LRT educated patients on ways they can increase their self-esteem.  Education Outcome: Acknowledges education/In group clarification offered  Clinical Observations/Feedback: Patient completed activity by writing positive traits about self. Patient contributed to group discussion by stating it was difficult to think of positive traits and why, and how his self-esteem affects him.  Leonette Monarch, LRT/CTRS 11/15/2015 3:03 PM

## 2015-11-15 NOTE — Plan of Care (Signed)
Problem: [facility] Participation in Recreation Therapeutic Interventions Goal: STG-Patient will demonstrate improved self esteem by identif STG: Self-Esteem - Within 4 treatment session, patient will verbalize at least 5 positive affirmation statements in each of 2 treatment sessions to increase self-esteem post d/c.  Outcome: Completed/Met Date Met:  11/15/15 Treatment Session 2; Completed 2 out of 2: At approximately 11:45 am, LRT met with patient in community room. Patient verbalized 5 positive affirmation statements. Patient reported it felt "okay" and a little better than yesterday. LRT encouraged patient to continue saying positive affirmation statements.  Leonette Monarch, LRT/CTRS 06.07.17 11:53 am Goal: STG-Other Recreation Therapy Goal (Specify) STG: Stress Management - Within 4 treatment sessions, patient will verbalize understanding of the stress management techniques in each of 2 treatment sessions to increase stress management skills post d/c.  Outcome: Completed/Met Date Met:  11/15/15 Treatment Session 2; Completed 2 out of 2: At approximately 11:45 am, LRT met with patient in community room. Patient reported he read over and practiced some of the stress management techniques. Patient verbalized understanding and reported the techniques were helpful. LRT encouraged patient to continue practicing the stress management techniques.  Leonette Monarch, LRT/CTRS 06.07.17 11:55 am

## 2015-11-15 NOTE — BHH Group Notes (Signed)
[place] LCSW Group Therapy  11/15/2015 2:09 PM  Type of Therapy:  Group Therapy  Participation Level:  Did Not Attend  Modes of Intervention:  Discussion, Education, Socialization and Support  Summary of Progress/Problems: Self esteem: Patients discussed self esteem and how it impacts them. They discussed what aspects in their lives has influenced their self esteem. They were challenged to identify changes that are needed in order to improve self esteem.    [place] MSW, [place]  11/15/2015, 2:09 PM

## 2015-11-15 NOTE — Progress Notes (Signed)
[facility] MD Progress Note  11/15/2015 11:31 AM [name].  MRN:  [id number]  Subjective:  Mr. William Coleman seems slightly better today. He is only marginally mentions his bowel movements although is very worried that Cozaar that we just restarted will make it worse. His depression has been severe but he is only passing thoughts of suicide. His blood pressure is not adequately controlled with Cozaar and I want to start hydrochlorothiazide. The patient is extremely worried that it will worsen his bowel movements but agrees to try it. He realized that his family discovered that he is hospitalized. He is no longer worried about it too much as "the cat is out". Sleep and appetite are fair although he woke up in the middle of the night and difficulties falling asleep again. He tolerates medications well.  Principal Problem: Severe recurrent major depression without psychotic features ([place]) Diagnosis:   Patient Active Problem List   Diagnosis Date Noted  . Severe recurrent major depression without psychotic features ([place]) [F33.2] 11/10/2015  . Suicidal ideation [R45.851] 11/10/2015  . Allergic rhinitis [J30.9] 11/10/2014  . HLD (hyperlipidemia) [E78.5] 11/10/2014  . HTN (hypertension) [I10] 11/10/2014  . Adult hypothyroidism [E03.9] 11/10/2014  . 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 Obstructive apnea [G47.33] 11/10/2014   Total Time spent with patient: 20 minutes  Past Psychiatric History: Depression, anxiety.  Past Medical History:  Past Medical History  Diagnosis Date  . Obsessive compulsive disorder   . Depression   . Hypertension   . Anginal pain ([place])   . Sleep apnea   . Hypothyroidism   . Bipolar disorder [facility])     Past Surgical History  Procedure Laterality Date  . Nasal septum surgery    . Mouth surgery      orthodontic surgery for underbite  . Myringotomy with tube placement     Family History:  Family History  Problem Relation Age of Onset  . Depression Mother   .  Anxiety disorder Mother   . Congestive Heart Failure Father   . Prostate cancer Father   . Hypertension Father    Family Psychiatric  History: See H&P. Social History:  History  Alcohol Use No    Comment: rarely     History  Drug Use No    Social History   Social History  . Marital Status: Single    Spouse Name: N/A  . Number of Children: N/A  . Years of Education: N/A   Social History Main Topics  . Smoking status: Never Smoker   . Smokeless tobacco: None  . Alcohol Use: No     Comment: rarely  . Drug Use: No  . Sexual Activity: No   Other Topics Concern  . None   Social History Narrative   Additional Social History:    History of alcohol / drug use?: No history of alcohol / drug abuse                    Sleep: Fair  Appetite:  Fair  Current Medications: Current Facility-Administered Medications  Medication Dose Route Frequency Provider Last Rate Last Dose  . acetaminophen (TYLENOL) tablet 650 mg  650 mg Oral Q6H PRN Gonzella Lex, MD   650 mg at 11/15/15 0640  . alum & mag hydroxide-simeth (MAALOX/MYLANTA) 200-200-20 MG/5ML suspension 30 mL  30 mL Oral Q4H PRN Gonzella Lex, MD      . escitalopram (LEXAPRO) tablet 5 mg  5 mg Oral Daily John T Clapacs,  MD   5 mg at 11/15/15 0828  . fluvoxaMINE (LUVOX) tablet 100 mg  100 mg Oral QHS Corynn Solberg B Christl Fessenden, MD   100 mg at 11/14/15 2142  . hydrochlorothiazide (HYDRODIURIL) tablet 25 mg  25 mg Oral Daily Aneri Slagel B Krzysztof Reichelt, MD      . levothyroxine (SYNTHROID, LEVOTHROID) tablet 25 mcg  25 mcg Oral QAC breakfast Gonzella Lex, MD   25 mcg at 11/15/15 0640  . losartan (COZAAR) tablet 50 mg  50 mg Oral Daily Gonzella Lex, MD   50 mg at 11/15/15 0829  . magnesium hydroxide (MILK OF MAGNESIA) suspension 30 mL  30 mL Oral Daily PRN Gonzella Lex, MD        Lab Results:  Results for orders placed or performed during the hospital encounter of 11/10/15 (from the past 48 hour(s))  Lipid panel     Status:  Abnormal   Collection Time: 11/14/15  6:40 AM  Result Value Ref Range   Cholesterol 292 (H) 0 - 200 mg/dL   Triglycerides 247 (H) <150 mg/dL   HDL 47 >40 mg/dL   Total CHOL/HDL Ratio 6.2 RATIO   VLDL 49 (H) 0 - 40 mg/dL   LDL Cholesterol 196 (H) 0 - 99 mg/dL    Comment:        Total Cholesterol/HDL:CHD Risk Coronary Heart Disease Risk Table                     Men   Women  1/2 Average Risk   3.4   3.3  Average Risk       5.0   4.4  2 X Average Risk   9.6   7.1  3 X Average Risk  23.4   11.0        Use the calculated Patient Ratio above and the CHD Risk Table to determine the patient's CHD Risk.        ATP III CLASSIFICATION (LDL):  <100     mg/dL   Optimal  100-129  mg/dL   Near or Above                    Optimal  130-159  mg/dL   Borderline  160-189  mg/dL   High  >190     mg/dL   Very High   Hemoglobin A1c     Status: None   Collection Time: 11/14/15  6:40 AM  Result Value Ref Range   Hgb A1c MFr Bld 5.1 4.0 - 6.0 %  TSH     Status: None   Collection Time: 11/14/15  6:40 AM  Result Value Ref Range   TSH 1.388 0.350 - 4.500 uIU/mL  Prolactin     Status: None   Collection Time: 11/14/15  6:40 AM  Result Value Ref Range   Prolactin 6.8 4.0 - 15.2 ng/mL    Comment: (NOTE) Performed At: [facility] [place], [place] [id number] Lindon Romp MD [id number]     Blood Alcohol level:  Lab Results  Component Value Date   [facility] <5 11/09/2015    Physical Findings: AIMS: Facial and Oral Movements Muscles of Facial Expression: None, normal Lips and Perioral Area: None, normal Jaw: None, normal Tongue: None, normal,Extremity Movements Upper (arms, wrists, hands, fingers): None, normal Lower (legs, knees, ankles, toes): None, normal, Trunk Movements Neck, shoulders, hips: None, normal, Overall Severity Severity of abnormal movements (highest score from questions above): None, normal Incapacitation due  to abnormal movements: None,  normal Patient's awareness of abnormal movements (rate only patient's report): No Awareness, Dental Status Current problems with teeth and/or dentures?: No Does patient usually wear dentures?: No  CIWA:    COWS:  COWS Total Score: 0  Musculoskeletal: Strength & Muscle Tone: within normal limits Gait & Station: normal Patient leans: N/A  Psychiatric Specialty Exam: Physical Exam  Nursing note and vitals reviewed.   Review of Systems  Psychiatric/Behavioral: Positive for depression. The patient is nervous/anxious.   All other systems reviewed and are negative.   Blood pressure 166/104, pulse 69, temperature 97.8 F (36.6 C), temperature source Oral, resp. rate 18, height 5\' 7"  (1.702 m), weight 67.586 kg (149 lb), SpO2 100 %.Body mass index is 23.33 kg/(m^2).  General Appearance: Casual  Eye Contact:  Good  Speech:  Clear and Coherent  Volume:  Normal  Mood:  Anxious  Affect:  Constricted  Thought Process:  Goal Directed  Orientation:  Full (Time, Place, and Person)  Thought Content:  Obsessions and Rumination  Suicidal Thoughts:  Yes.  without intent/plan  Homicidal Thoughts:  No  Memory:  Immediate;   Fair Recent;   Fair Remote;   Fair  Judgement:  Fair  Insight:  Lacking  Psychomotor Activity:  Normal  Concentration:  Concentration: Fair and Attention Span: Fair  Recall:  [employer] of Knowledge:  Fair  Language:  Fair  Akathisia:  No  Handed:  Right  AIMS (if indicated):     Assets:  Communication Skills Desire for Improvement Financial Resources/Insurance Housing Physical Health Resilience Social Support Transportation Vocational/Educational  ADL's:  Intact  Cognition:  WNL  Sleep:  Number of Hours: 7.5     Treatment Plan Summary: Daily contact with patient to assess and evaluate symptoms and progress in treatment and Medication management   Mr. milligrams is a 55 year old male with a history of depression and anxiety admitted for worsening of  depression and suicidal ideation with a plan to step in front of the traffic.  1. Suicidal ideation. The patient is able to contract for safety in the hospital.  2. Depression and anxiety. He was started on Lexapro 5 mg on Saturday. We will increase Luvox tonight to 100 mg to address OCD symptoms. We discussed starting Risperdal as well for augmentation.  3. Hypothyroidism. He is on Synthroid.  4. Hypertension. His blood pressure has been elevated he was restarted on Cozaar. BP is still elevated. We will add HCTZ.  5. Metabolic syndromes screening. Llipid profile is elevated but the patient wants to address it with diet first. TSH, hemoglobin A1c and prolactin are normal.    6. Disposition. He will be discharged to home. He will follow up with [name] at [facility].  Orson Slick, MD 11/15/2015, 11:31 AM

## 2015-11-15 NOTE — BHH Group Notes (Signed)
[place] LCSW Group Therapy   11/14/2015 9:30 am  Type of Therapy: Group Therapy   Participation Level: Did Not Attend. Patient invited to participate but declined.    Alphonse Guild. Sahvannah Rieser, MSW, LCSWA, LCAS

## 2015-11-15 NOTE — Plan of Care (Signed)
Problem: Education: Goal: Knowledge of the prescribed therapeutic regimen will improve Outcome: Progressing Understands medication

## 2015-11-15 NOTE — Progress Notes (Signed)
Pt pleasant and cooperative. Med and group compliant. Appropriate with staff and peers. Denies SI, HI, AVH. Endorses depression at 8. No complaints this shift. Encouraged pt to verbalize feelings. Pt receptive and remains safe on unit with q 15 min checks.

## 2015-11-15 NOTE — Tx Team (Signed)
Interdisciplinary Treatment Plan Update (Adult)  Date:  6/6/2017Time Reviewed:  1:38 PM  Progress in Treatment: Attending groups: Yes. Participating in groups:  Yes. Taking medication as prescribed:  Yes. Tolerating medication:  Yes. Family/Significant othe contact made:  No, will contact:  Pt refused Patient understands diagnosis:  Yes. Discussing patient identified problems/goals with staff:  Yes. Medical problems stabilized or resolved:  Yes. Denies suicidal/homicidal ideation: No. Issues/concerns per patient self-inventory:  No. Other:  New problem(s) identified: No, Describe:     Discharge Plan or Barriers: Home with outpatient provider for medication management and therapy  Reason for Continuation of Hospitalization: Anxiety Depression Medication stabilization  Comments:William Coleman reports some improvement. His mood is improving but he still feels suicidal. His anxiety is much better. He appears to tolerate medications well including Luvox for OCD symptoms. He is still grossly preoccupied with his bowel movements. He has no somatic complaints. He participates in programming.  Estimated length of stay:3 days  New goal(s):  Review of initial/current patient goals per problem list:   1.  Goal(s): Patient will participate in aftercare plan * Met: NO * Target date: at discharge * As evidenced by: Patient will participate within aftercare plan AEB aftercare provider and housing plan at discharge being identified.   2.  Goal (s): Patient will exhibit decreased depressive symptoms and suicidal ideations. * Met: NO *  Target date: at discharge * As evidenced by: Patient will utilize self rating of depression at 3 or below and demonstrate decreased signs of depression or be deemed stable for discharge by MD.   3.  Goal(s): Patient will demonstrate decreased signs and symptoms of anxiety. * Met: NO * Target date: at discharge * As evidenced by: Patient will utilize self  rating of anxiety at 3 or below and demonstrated decreased signs of anxiety, or be deemed stable for discharge by MD  Attendees: Patient:  William Coleman 6/7/20171:38 PM  Family:   6/7/20171:38 PM  Physician:  Orson Slick 6/7/20171:38 PM  Nursing:   Polly Cobia, RN 6/7/20171:38 PM  Case Manager:   6/7/20171:38 PM  Counselor:  Dossie Arbour, LCSW 6/7/20171:38 PM  Other:  Everitt Amber LRT 6/7/20171:38 PM  Other:   6/7/20171:38 PM  Other:   6/7/20171:38 PM  Other:  6/7/20171:38 PM  Other:  6/7/20171:38 PM  Other:  6/7/20171:38 PM  Other:  6/7/20171:38 PM  Other:  6/7/20171:38 PM  Other:  6/7/20171:38 PM  Other:   6/7/20171:38 PM   Scribe for Treatment Team:   August Saucer, 11/15/2015, 1:38 PM, MSW, LCSW

## 2015-11-16 MED ORDER — HYDROCHLOROTHIAZIDE 25 MG PO TABS: 25.0000 mg | ORAL_TABLET | Freq: Every day | ORAL | Status: DC

## 2015-11-16 MED ORDER — LEVOTHYROXINE SODIUM 25 MCG PO TABS: 25.0000 ug | ORAL_TABLET | Freq: Every day | ORAL | Status: DC

## 2015-11-16 MED ORDER — FLUVOXAMINE MALEATE 100 MG PO TABS: 100.0000 mg | ORAL_TABLET | Freq: Every day | ORAL | Status: DC

## 2015-11-16 MED ORDER — LOSARTAN POTASSIUM 50 MG PO TABS: 50.0000 mg | ORAL_TABLET | Freq: Every day | ORAL | Status: DC

## 2015-11-16 NOTE — Progress Notes (Signed)
D: Pt denies SI/HI/AVH. Pt is pleasant and cooperative, affect is flat but brightens upon approach. Pt stated he feels better from taking medicine. Patient appears less anxious and he is interacting with peers and staff appropriately.  A: Pt was offered support and encouragement. Pt was given scheduled medications. Pt was encouraged to attend groups. Q 15 minute checks were done for safety.  R:Pt attends groups and interacts well with peers and staff. Pt is taking medication. Pt has no complaints.Pt receptive to treatment and safety maintained on unit.

## 2015-11-16 NOTE — BHH Suicide Risk Assessment (Addendum)
[facility] Discharge Suicide Risk Assessment   Principal Problem: Severe recurrent major depression without psychotic features [facility]) Discharge Diagnoses:  Patient Active Problem List   Diagnosis Date Noted  . Severe recurrent major depression without psychotic features ([place]) [F33.2] 11/10/2015  . Suicidal ideation [R45.851] 11/10/2015  . Allergic rhinitis [J30.9] 11/10/2014  . HLD (hyperlipidemia) [E78.5] 11/10/2014  . HTN (hypertension) [I10] 11/10/2014  . Adult hypothyroidism [E03.9] 11/10/2014  . 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 Obstructive apnea [G47.33] 11/10/2014    Total Time spent with patient: 30 minutes  Musculoskeletal: Strength & Muscle Tone: within normal limits Gait & Station: normal Patient leans: N/A  Psychiatric Specialty Exam: Review of Systems  Psychiatric/Behavioral: Positive for depression. The patient is nervous/anxious.   All other systems reviewed and are negative.   Blood pressure 163/102, pulse 68, temperature 98.3 F (36.8 C), temperature source Oral, resp. rate 18, height 5\' 7"  (1.702 m), weight 67.586 kg (149 lb), SpO2 100 %.Body mass index is 23.33 kg/(m^2).  General Appearance: Casual  Eye Contact::  Good  Speech:  Clear and [id number]  Volume:  Normal  Mood:  Euthymic  Affect:  Appropriate  Thought Process:  Coherent and Goal Directed  Orientation:  Full (Time, Place, and Person)  Thought Content:  WDL  Suicidal Thoughts:  No  Homicidal Thoughts:  No  Memory:  Immediate;   Fair Recent;   Fair Remote;   Fair  Judgement:  Fair  Insight:  Fair  Psychomotor Activity:  Normal  Concentration:  Fair  Recall:  [employer] of [place]  Language: Fair  Akathisia:  No  Handed:  Right  AIMS (if indicated):     Assets:  Communication Skills Desire for Improvement Financial Resources/Insurance Housing Physical Health Resilience Social Support Transportation  Sleep:  Number of Hours: 7.5  Cognition: WNL  ADL's:  Intact   Mental  Status Per Nursing Assessment::   On Admission:  Suicidal ideation indicated by patient  Demographic Factors:  Male, Caucasian and Living alone  Loss Factors: NA  Historical Factors: NA  Risk Reduction Factors:   Sense of responsibility to family, Employed, Positive social support and Positive therapeutic relationship  Continued Clinical Symptoms:  Severe Anxiety and/or Agitation Depression:   Severe  Cognitive Features That Contribute To Risk:  None    Suicide Risk:  Minimal: No identifiable suicidal ideation.  Patients presenting with no risk factors but with morbid ruminations; may be classified as minimal risk based on the severity of the depressive symptoms  Follow-up Information    Follow up with [facility] In 3 days.   Why:  Your hospital follow up appointment will be walk in. Walk in hours are Monday- Friday between 8:00am and 10:00am.    Contact information:   [place] [address] [phone]       Plan Of Care/Follow-up recommendations:  Activity:  as toleratedas tolerated. Diet:  low sodium hert healthy. Other:  keep follow up appointments.    Orson Slick, MD 11/16/2015, 5:39 PM

## 2015-11-16 NOTE — Progress Notes (Signed)
D:  Per pt self inventory pt reports sleeping good, appetite good, energy level low, ability to pay attention good, rates depression at a 6 out of 10, hopelessness at a 1 out of 10, anxiety at a 2 out of 10, denies SI/HI/AVH, goal today: "feeling better, feeling happier, take meds and go to groups", appropriate during interaction, states that he is hoping be discharged tomorrow.     A:  Emotional support provided, Encouraged pt to continue with treatment plan and attend all group activities, q15 min checks maintained for safety.  R:  Pt is receptive, going to groups, pleasant and cooperative with staff and other patients on the unit.

## 2015-11-16 NOTE — Discharge Summary (Signed)
Physician Discharge Summary Note  Patient:  William Coleman. is an 55 y.o., male MRN:  [id number] DOB:  01-Jun-1961 Patient phone:  There is no home phone number on file.  Patient address:   [place] [address],  Total Time spent with patient: 30 minutes  Date of Admission:  11/10/2015 Date of Discharge: 11/17/2015  Reason for Admission:  Depression, anxiety, suicidal ideation.  History of Present Illness: Patient presents with depression which is chronic but worsening in the last few months. Passive suicidal thoughts. Increased hopelessness and helplessness. Poor sleep or energy poor focus. Currently not on any medicine for depression although he goes to the outpatient mental health clinic. Not abusing drugs or alcohol.  Associated Signs/Symptoms: Depression Symptoms: depressed mood, anhedonia, insomnia, psychomotor retardation, fatigue, feelings of worthlessness/guilt, (Hypo) Manic Symptoms: None Anxiety Symptoms: Excessive Worry, Psychotic Symptoms: None PTSD Symptoms: Negative  Past Psychiatric History: History of recurrent depression and anxiety and obsessive-compulsive disorder. Attempts and multiple medications in the past without any tolerance.  Principal Problem: Severe recurrent major depression without psychotic features [facility]) Discharge Diagnoses: Patient Active Problem List   Diagnosis Date Noted  . Severe recurrent major depression without psychotic features ([place]) [F33.2] 11/10/2015  . Suicidal ideation [R45.851] 11/10/2015  . Allergic rhinitis [J30.9] 11/10/2014  . HLD (hyperlipidemia) [E78.5] 11/10/2014  . HTN (hypertension) [I10] 11/10/2014  . Adult hypothyroidism [E03.9] 11/10/2014  . 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 Obstructive apnea [G47.33] 11/10/2014    Past Medical History:  Past Medical History  Diagnosis Date  . Obsessive compulsive disorder   . Depression   . Hypertension   . Anginal pain ([place])   . Sleep apnea    . Hypothyroidism   . Bipolar disorder [facility])     Past Surgical History  Procedure Laterality Date  . Nasal septum surgery    . Mouth surgery      orthodontic surgery for underbite  . Myringotomy with tube placement     Family History:  Family History  Problem Relation Age of Onset  . Depression Mother   . Anxiety disorder Mother   . Congestive Heart Failure Father   . Prostate cancer Father   . Hypertension Father    Family Psychiatric  History:  One reported. Social History:  History  Alcohol Use No    Comment: rarely     History  Drug Use No    Social History   Social History  . Marital Status: Single    Spouse Name: N/A  . Number of Children: N/A  . Years of Education: N/A   Social History Main Topics  . Smoking status: Never Smoker   . Smokeless tobacco: None  . Alcohol Use: No     Comment: rarely  . Drug Use: No  . Sexual Activity: No   Other Topics Concern  . None   Social History Narrative    Hospital Course:    William Coleman is a 55 year old male with a history of depression and anxiety admitted for worsening of depression and suicidal ideation with a plan to step in front of the traffic.  1. Suicidal ideation. This has resolved. The patient is able to contract for safety. He is forward thinking and optimistic about the future.  2. Depression and anxiety. He was started on Luvox tonight to address OCD symptoms.    3. Hypothyroidism. He is on Synthroid.  4. Hypertension. He was restarted on Cozaar. We added HCTZ as his blood pressure  was elevated still.  5. Metabolic syndromes screening. Llipid profile is elevated but the patient wants to address it with diet first. TSH, hemoglobin A1c and prolactin are normal.   6. Disposition. He was discharged to home. He will follow up with [name] at [facility].  Physical Findings: AIMS: Facial and Oral Movements Muscles of Facial Expression: None, normal Lips and Perioral Area: None, normal Jaw: None,  normal Tongue: None, normal,Extremity Movements Upper (arms, wrists, hands, fingers): None, normal Lower (legs, knees, ankles, toes): None, normal, Trunk Movements Neck, shoulders, hips: None, normal, Overall Severity Severity of abnormal movements (highest score from questions above): None, normal Incapacitation due to abnormal movements: None, normal Patient's awareness of abnormal movements (rate only patient's report): No Awareness, Dental Status Current problems with teeth and/or dentures?: No Does patient usually wear dentures?: No  CIWA:    COWS:  COWS Total Score: 0  Musculoskeletal: Strength & Muscle Tone: within normal limits Gait & Station: normal Patient leans: N/A  Psychiatric Specialty Exam: Physical Exam  Nursing note and vitals reviewed.   Review of Systems  Psychiatric/Behavioral: Positive for depression. The patient is nervous/anxious.   All other systems reviewed and are negative.   Blood pressure 163/102, pulse 68, temperature 98.3 F (36.8 C), temperature source Oral, resp. rate 18, height 5\' 7"  (1.702 m), weight 67.586 kg (149 lb), SpO2 100 %.Body mass index is 23.33 kg/(m^2).  See SRA.                                                  Sleep:  Number of Hours: 7.5     Have you used any form of tobacco in the last 30 days? (Cigarettes, Smokeless Tobacco, Cigars, and/or Pipes): No  Has this patient used any form of tobacco in the last 30 days? (Cigarettes, Smokeless Tobacco, Cigars, and/or Pipes) Yes, Yes, A prescription for an FDA-approved tobacco cessation medication was offered at discharge and the patient refused  Blood Alcohol level:  Lab Results  Component Value Date   [facility] <5 [id number]    Metabolic Disorder Labs:  Lab Results  Component Value Date   HGBA1C 5.1 11/14/2015   Lab Results  Component Value Date   PROLACTIN 6.8 11/14/2015   Lab Results  Component Value Date   CHOL 292* 11/14/2015   TRIG 247* 11/14/2015    HDL 47 11/14/2015   CHOLHDL 6.2 11/14/2015   VLDL 49* 11/14/2015   LDLCALC 196* 11/14/2015   [place] 133 06/19/2013    See Psychiatric Specialty Exam and Suicide Risk Assessment completed by Attending Physician prior to discharge.  Discharge destination:  Home  Is patient on multiple antipsychotic therapies at discharge:  No   Has Patient had three or more failed trials of antipsychotic monotherapy by history:  No  Recommended Plan for Multiple Antipsychotic Therapies: NA  Discharge Instructions    Diet - low sodium heart healthy    Complete by:  As directed      Increase activity slowly    Complete by:  As directed             Medication List    TAKE these medications      Indication   fluvoxaMINE 100 MG tablet  Commonly known as:  LUVOX  Take 1 tablet (100 mg total) by mouth at bedtime.   Indication:  Obsessive Compulsive  Disorder     hydrochlorothiazide 25 MG tablet  Commonly known as:  HYDRODIURIL  Take 1 tablet (25 mg total) by mouth daily.   Indication:  High Blood Pressure     levothyroxine 25 MCG tablet  Commonly known as:  SYNTHROID, LEVOTHROID  Take 1 tablet (25 mcg total) by mouth daily.   Indication:  Underactive Thyroid     losartan 50 MG tablet  Commonly known as:  COZAAR  Take 1 tablet (50 mg total) by mouth daily.   Indication:  High Blood Pressure           Follow-up Information    Follow up with [facility] In 3 days.   Why:  Your hospital follow up appointment will be walk in. Walk in hours are Monday- Friday between 8:00am and 10:00am.    Contact information:   [place] [address] [phone]       Follow-up recommendations:  Activity:  as tolerated. Diet:  low sodium heart healthy. Other:  keep follow up appointments.  Comments:    Signed: Orson Slick, MD 11/16/2015, 5:50 PM

## 2015-11-16 NOTE — Progress Notes (Signed)
[facility] MD Progress Note  11/16/2015 12:10 PM William Coleman.  MRN:  [id number]  Subjective:  William Coleman met with his treatment team this morning. He is pleasant polite and cooperative. He still complains of depression but no suicidal ideation. His anxiety is much improved and amazingly he did not mention his poop even once during our interview. He also seems to tolerate medications well. We added hydrochlorothiazide and Cozaar yesterday to address elevated blood pressure. I will not increase Luvox which is now at 100 mg and even to his outpatient provider titrated up if necessary for OCD symptoms. There are no somatic complaints. The patient participates in programming. Anticipated discharge tomorrow.  Principal Problem: Severe recurrent major depression without psychotic features ([place]) Diagnosis:   Patient Active Problem List   Diagnosis Date Noted  . Severe recurrent major depression without psychotic features ([place]) [F33.2] 11/10/2015  . Suicidal ideation [R45.851] 11/10/2015  . Allergic rhinitis [J30.9] 11/10/2014  . HLD (hyperlipidemia) [E78.5] 11/10/2014  . HTN (hypertension) [I10] 11/10/2014  . Adult hypothyroidism [E03.9] 11/10/2014  . 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 Obstructive apnea [G47.33] 11/10/2014   Total Time spent with patient: 20 minutes  Past Psychiatric History: Depression, OCD.  Past Medical History:  Past Medical History  Diagnosis Date  . Obsessive compulsive disorder   . Depression   . Hypertension   . Anginal pain ([place])   . Sleep apnea   . Hypothyroidism   . Bipolar disorder [facility])     Past Surgical History  Procedure Laterality Date  . Nasal septum surgery    . Mouth surgery      orthodontic surgery for underbite  . Myringotomy with tube placement     Family History:  Family History  Problem Relation Age of Onset  . Depression Mother   . Anxiety disorder Mother   . Congestive Heart Failure Father   . Prostate cancer Father   .  Hypertension Father    Family Psychiatric  History: See H&P. Social History:  History  Alcohol Use No    Comment: rarely     History  Drug Use No    Social History   Social History  . Marital Status: Single    Spouse Name: N/A  . Number of Children: N/A  . Years of Education: N/A   Social History Main Topics  . Smoking status: Never Smoker   . Smokeless tobacco: None  . Alcohol Use: No     Comment: rarely  . Drug Use: No  . Sexual Activity: No   Other Topics Concern  . None   Social History Narrative   Additional Social History:    History of alcohol / drug use?: No history of alcohol / drug abuse                    Sleep: Fair  Appetite:  Fair  Current Medications: Current Facility-Administered Medications  Medication Dose Route Frequency Provider Last Rate Last Dose  . acetaminophen (TYLENOL) tablet 650 mg  650 mg Oral Q6H PRN Gonzella Lex, MD   650 mg at 11/16/15 0654  . alum & mag hydroxide-simeth (MAALOX/MYLANTA) 200-200-20 MG/5ML suspension 30 mL  30 mL Oral Q4H PRN Gonzella Lex, MD      . escitalopram (LEXAPRO) tablet 5 mg  5 mg Oral Daily Gonzella Lex, MD   5 mg at 11/16/15 0902  . fluvoxaMINE (LUVOX) tablet 100 mg  100 mg Oral QHS Clovis Fredrickson, MD  100 mg at 11/15/15 2118  . hydrochlorothiazide (HYDRODIURIL) tablet 25 mg  25 mg Oral Daily Clovis Fredrickson, MD   25 mg at 11/16/15 0902  . levothyroxine (SYNTHROID, LEVOTHROID) tablet 25 mcg  25 mcg Oral QAC breakfast Gonzella Lex, MD   25 mcg at 11/16/15 0654  . losartan (COZAAR) tablet 50 mg  50 mg Oral Daily Gonzella Lex, MD   50 mg at 11/16/15 0902  . magnesium hydroxide (MILK OF MAGNESIA) suspension 30 mL  30 mL Oral Daily PRN Gonzella Lex, MD        Lab Results: No results found for this or any previous visit (from the past 48 hour(s)).  Blood Alcohol level:  Lab Results  Component Value Date   ETH <5 11/09/2015    Physical Findings: AIMS: Facial and Oral  Movements Muscles of Facial Expression: None, normal Lips and Perioral Area: None, normal Jaw: None, normal Tongue: None, normal,Extremity Movements Upper (arms, wrists, hands, fingers): None, normal Lower (legs, knees, ankles, toes): None, normal, Trunk Movements Neck, shoulders, hips: None, normal, Overall Severity Severity of abnormal movements (highest score from questions above): None, normal Incapacitation due to abnormal movements: None, normal Patient's awareness of abnormal movements (rate only patient's report): No Awareness, Dental Status Current problems with teeth and/or dentures?: No Does patient usually wear dentures?: No  CIWA:    COWS:  COWS Total Score: 0  Musculoskeletal: Strength & Muscle Tone: within normal limits Gait & Station: normal Patient leans: Backward  Psychiatric Specialty Exam: Physical Exam  Nursing note and vitals reviewed.   Review of Systems  Psychiatric/Behavioral: Positive for depression. The patient is nervous/anxious.   All other systems reviewed and are negative.   Blood pressure 171/99, pulse 65, temperature 98.3 F (36.8 C), temperature source Oral, resp. rate 18, height 5' 7"  (1.702 m), weight 67.586 kg (149 lb), SpO2 100 %.Body mass index is 23.33 kg/(m^2).  General Appearance: Casual  Eye Contact:  Good  Speech:  Clear and Coherent  Volume:  Normal  Mood:  Anxious  Affect:  Appropriate  Thought Process:  Goal Directed  Orientation:  Full (Time, Place, and Person)  Thought Content:  WDL  Suicidal Thoughts:  No  Homicidal Thoughts:  No  Memory:  Immediate;   Fair Recent;   Fair Remote;   Fair  Judgement:  Fair  Insight:  Fair  Psychomotor Activity:  Normal  Concentration:  Concentration: Fair and Attention Span: Fair  Recall:  [employer] of Knowledge:  Fair  Language:  Fair  Akathisia:  No  Handed:  Right  AIMS (if indicated):     Assets:  Communication Skills Desire for Improvement Financial  Resources/Insurance Housing Physical Health Resilience Social Support Transportation  ADL's:  Intact  Cognition:  WNL  Sleep:  Number of Hours: 7.5     Treatment Plan Summary: Daily contact with patient to assess and evaluate symptoms and progress in treatment and Medication management   William Coleman is a 55 year old male with a history of depression and anxiety admitted for worsening of depression and suicidal ideation with a plan to step in front of the traffic.  1. Suicidal ideation. The patient is able to contract for safety in the hospital.  2. Depression and anxiety. He was started on Lexapro 5 mg on Saturday. We will increase Luvox tonight to 100 mg to address OCD symptoms. I will discontinue Lexapro.   3. Hypothyroidism. He is on Synthroid.  4. Hypertension. His blood pressure  has been elevated he was restarted on Cozaar. We added HCTZ as his blood pressure was elevated still.  5. Metabolic syndromes screening. Llipid profile is elevated but the patient wants to address it with diet first. TSH, hemoglobin A1c and prolactin are normal.   6. Disposition. He will be discharged to home. He will follow up with [name] at [facility].  Orson Slick, MD 11/16/2015, 12:10 PM

## 2015-11-16 NOTE — BHH Group Notes (Signed)
[facility] Group Notes:  (Nursing/MHT/Case Management/Adjunct)  Date:  11/16/2015  Time:  11:33 PM  Type of Therapy:  Psychoeducational Skills  Participation Level:  Active  Participation Quality:  Appropriate and Sharing  Affect:  Appropriate  Cognitive:  Appropriate  Insight:  Good  Engagement in Group:  Engaged  Modes of Intervention:  Discussion, Socialization and Support  Summary of Progress/Problems:  William Coleman 11/16/2015, 11:33 PM

## 2015-11-16 NOTE — BHH Group Notes (Signed)
[facility] LCSW Group Therapy   11/16/2015 9:30am   Type of Therapy: Group Therapy   Participation Level: Active   Participation Quality: Attentive, Sharing and Supportive   Affect: Appropriate   Cognitive: Alert and Oriented   Insight: Developing/Improving and Engaged   Engagement in Therapy: Developing/Improving and Engaged   Modes of Intervention: Clarification, Confrontation, Discussion, Education, Exploration, Limit-setting, Orientation, Problem-solving, Rapport Building, Art therapist, Socialization and Support   Summary of Progress/Problems: The topic for group was balance in life. Today's group focused on defining balance in one's own words, identifying things that can knock one off balance, and exploring healthy ways to maintain balance in life. Group members were asked to provide an example of a time when they felt off balance, describe how they handled that situation, and process healthier ways to regain balance in the future. Group members were asked to share the most important tool for maintaining balance that they learned while at [facility] and how they plan to apply this method after discharge. Pt shared with the group that one of the obstacles towards achieving balance is his diagnoses and the symptoms of his diagnoses. Pt shared he once was informed of his diagnoses and then immediately began to be in denial.  Pt shared he didn't take his medications and failed to see doctors for treatment and suffered consequences as a result.  Pt share in the future the pt will utilize medication management, therapy and support to achieve balance.  Alphonse Guild. Sylvanna Burggraf, LCSWA, LCAS  11/16/15

## 2015-11-16 NOTE — Tx Team (Signed)
Interdisciplinary Treatment Plan Update (Adult)  Date:  11/16/2015 Time Reviewed:  2:49 PM  Progress in Treatment: Attending groups: Yes. Participating in groups:  Yes. Taking medication as prescribed:  Yes. Tolerating medication:  Yes. Family/Significant othe contact made:  No, will contact:  pt refused  Patient understands diagnosis:  Yes. Discussing patient identified problems/goals with staff:  Yes. Medical problems stabilized or resolved:  Yes. Denies suicidal/homicidal ideation: Yes. Issues/concerns per patient self-inventory:  Yes. Other:  New problem(s) identified: No, Describe:  NA  Discharge Plan or Barriers: Pt plans to return home and follow up with outpatient.    Reason for Continuation of Hospitalization: Depression Medication stabilization Suicidal ideation  Comments: William Coleman met with his treatment team this morning. He is pleasant polite and cooperative. He still complains of depression but no suicidal ideation. His anxiety is much improved and amazingly he did not mention his poop even once during our interview. He also seems to tolerate medications well. We added hydrochlorothiazide and Cozaar yesterday to address elevated blood pressure. I will not increase Luvox which is now at 100 mg and even to his outpatient provider titrated up if necessary for OCD symptoms. There are no somatic complaints. The patient participates in programming. Anticipated discharge tomorrow.  Estimated length of stay: 1 day  New goal(s): NA  Review of initial/current patient goals per problem list:   1.  Goal(s): Patient will participate in aftercare plan * Met:  * Target date: at discharge * As evidenced by: Patient will participate within aftercare plan AEB aftercare provider and housing plan at discharge being identified.   2.  Goal (s): Patient will exhibit decreased depressive symptoms and suicidal ideations. * Met:  *  Target date: at discharge * As evidenced by: Patient  will utilize self rating of depression at 3 or below and demonstrate decreased signs of depression or be deemed stable for discharge by MD.   3.  Goal(s): Patient will demonstrate decreased signs and symptoms of anxiety. * Met:  * Target date: at discharge * As evidenced by: Patient will utilize self rating of anxiety at 3 or below and demonstrated decreased signs of anxiety, or be deemed stable for discharge by MD   Attendees: Patient:  William Coleman 6/8/20172:49 PM  Family:   6/8/20172:49 PM  Physician:  Dr. Bary Leriche    6/8/20172:49 PM  Nurse: Elige Radon, RN   6/8/20172:49 PM  Case Manager:   6/8/20172:49 PM  Counselor:   6/8/20172:49 PM  Other:  Wray Kearns, [place] 6/8/20172:49 PM  Other:  Everitt Amber, [place]  6/8/20172:49 PM  Other:   6/8/20172:49 PM  Other:  6/8/20172:49 PM  Other:  6/8/20172:49 PM  Other:  6/8/20172:49 PM  Other:  6/8/20172:49 PM  Other:  6/8/20172:49 PM  Other:  6/8/20172:49 PM  Other:   6/8/20172:49 PM   Scribe for Treatment Team:   Wray Kearns, MSW, LCSWA  11/16/2015, 2:49 PM

## 2015-11-16 NOTE — Plan of Care (Signed)
Problem: Health Behavior/Discharge Planning: Goal: Compliance with therapeutic regimen will improve Outcome: Progressing Patient compliant with medication.

## 2015-11-16 NOTE — Progress Notes (Signed)
Recreation Therapy Notes  Date: 06.08.17 Time: 1:00 pm Location: Craft Room  Group Topic: Leisure Education  Goal Area(s) Addresses:  Patient will identify things they are grateful for. Patient will identify how being grateful can influence decision making.  Behavioral Response: Attentive, Interactive  Intervention: Grateful Wheel  Activity: Patients were given an I Am Grateful For worksheet and instructed to write things they are grateful for under each category.  Education: LRT educated patients on why it is important to be grateful.  Education Outcome: Acknowledges education/In group clarification offered   Clinical Observations/Feedback: Patient completed activity by writing down things he was grateful for. Patient contributed to group discussion by stating things he was grateful for.  Leonette Monarch, LRT/CTRS 11/16/2015 2:23 PM

## 2015-11-16 NOTE — BHH Group Notes (Signed)
[name] Group Notes:  (Nursing/MHT/Case Management/Adjunct)  Date:  11/16/2015  Time:  6:11 PM  Type of Therapy:  Psychoeducational Skills  Participation Level:  Active  Participation Quality:  Appropriate, Attentive and Sharing  Affect:  Appropriate  Cognitive:  Appropriate  Insight:  Appropriate and Good  Engagement in Group:  Engaged and Supportive  Modes of Intervention:  Education, Exploration and Support  Summary of Progress/Problems:  William Coleman 11/16/2015, 6:11 PM

## 2015-11-17 NOTE — Progress Notes (Signed)
D: Pt denies SI/HI/AVH. Pt is pleasant and cooperative, affect is flat and sad but he brightens upon approach, pt  he appears less anxious and he is interacting with peers and staff appropriately.  A: Pt was offered support and encouragement. Pt was given scheduled medications. Pt was encouraged to attend groups. Q 15 minute checks were done for safety.  R:Pt attends groups and interacts well with peers and staff. Pt is taking medication. Pt has no complaints.Pt receptive to treatment and safety maintained on unit.

## 2015-11-17 NOTE — Progress Notes (Signed)
  [facility] Adult Case Management Discharge Plan :  Will you be returning to the same living situation after discharge:  Yes,  home At discharge, do you have transportation home?: Yes,  bus Do you have the ability to pay for your medications: Yes,  insurance, income   Release of information consent forms completed and in the chart;  Patient's signature needed at discharge.  Patient to Follow up at: Follow-up Information    Follow up with [facility] In 3 days.   Why:  Your hospital follow up appointment will be walk in. Walk in hours are Monday- Friday between 8:00am and 10:00am.    Contact information:   [address] [facility] [address] [phone]       Next level of care provider has access to [place] and Suicide Prevention discussed: Yes,  with patient   Have you used any form of tobacco in the last 30 days? (Cigarettes, Smokeless Tobacco, Cigars, and/or Pipes): No  Has patient been referred to the Quitline?: N/A patient is not a smoker  Patient has been referred for addiction treatment: [place] MSW, [place]  11/17/2015, 10:12 AM

## 2015-11-17 NOTE — Progress Notes (Signed)
Pleasant and cooperative.  Hyper verbal at times.  Denies SI/HI/AVH Discharge instructions given, verbalized understanding.  Prescriptions and crisis card given.  Personal belongings returned.  Escorted off unit by staff member to be transported to bus stop by security to travel home.

## 2015-11-17 NOTE — Progress Notes (Signed)
Recreation Therapy Notes  Date: 06.09.17 Time: 9:30 am Location: Craft Room  Group Topic: Coping Skills  Goal Area(s) Addresses:  Patient will participate in coping skill. Patient will verbalize benefit of art as a coping skill.  Behavioral Response: Attentive  Intervention: Coloring  Activity: Patients were given coloring sheets to color and were asked to think about the emotions they were feeling and what their mind was focused on.  Education: LRT educated patients on healthy coping skills.   Education Outcome: In group clarification offered  Clinical Observations/Feedback: Patient colored coloring sheet. Patient did not contribute to group discussion.  Leonette Monarch, LRT/CTRS 11/17/2015 10:29 AM

## 2015-11-28 DIAGNOSIS — F429 Obsessive-compulsive disorder, unspecified: Secondary | ICD-10-CM | POA: Diagnosis not present

## 2015-12-04 ENCOUNTER — Encounter: Payer: Self-pay | Admitting: Family Medicine

## 2015-12-04 ENCOUNTER — Ambulatory Visit (INDEPENDENT_AMBULATORY_CARE_PROVIDER_SITE_OTHER): Payer: BLUE CROSS/BLUE SHIELD | Admitting: Family Medicine

## 2015-12-04 VITALS — BP 120/70 | HR 94 | Temp 97.9°F | Resp 16 | Ht 67.0 in | Wt 160.0 lb

## 2015-12-04 DIAGNOSIS — E785 Hyperlipidemia, unspecified: Secondary | ICD-10-CM

## 2015-12-04 DIAGNOSIS — D239 Other benign neoplasm of skin, unspecified: Secondary | ICD-10-CM

## 2015-12-04 DIAGNOSIS — D229 Melanocytic nevi, unspecified: Secondary | ICD-10-CM

## 2015-12-04 DIAGNOSIS — E039 Hypothyroidism, unspecified: Secondary | ICD-10-CM

## 2015-12-04 MED ORDER — LEVOTHYROXINE SODIUM 25 MCG PO TABS: 25.0000 ug | ORAL_TABLET | Freq: Every day | ORAL | Status: DC

## 2015-12-04 MED ORDER — LOSARTAN POTASSIUM 50 MG PO TABS: 50.0000 mg | ORAL_TABLET | Freq: Every day | ORAL | Status: DC

## 2015-12-04 MED ORDER — ROSUVASTATIN CALCIUM 10 MG PO TABS: 10.0000 mg | ORAL_TABLET | Freq: Every day | ORAL | Status: DC

## 2015-12-04 NOTE — Patient Instructions (Signed)
Refills for  Losartan 50 mg qd Levothyroxine 25 mcg qd Crestor 10 mg qd Referral to Dermatology-Dr. Evorn Gong

## 2015-12-04 NOTE — Progress Notes (Signed)
Patient: William Coleman. Male    DOB: Dec 12, 1960   55 y.o.   MRN: [id number] Visit Date: 12/04/2015  Today's Provider: Wilhemena Durie, MD   Chief Complaint  Patient presents with  . Hospitalization Follow-up   Subjective:    HPI      Follow up Hospitalization  Patient was admitted to [facility] on 11/09/2015 and discharged on 11/17/2015. He was treated for suicial ideation. Treatment for this included:patient was admitted to behavioral health, labs and UA He reports good compliance with treatment. He reports this condition is Improved.  --------------------------------------------------------------------    Allergies  Allergen Reactions  . Amoxicillin   . Penicillins   . Sulfa Antibiotics    Current Meds  Medication Sig  . levothyroxine (SYNTHROID, LEVOTHROID) 25 MCG tablet Take 1 tablet (25 mcg total) by mouth daily.  Marland Kitchen losartan (COZAAR) 50 MG tablet Take 1 tablet (50 mg total) by mouth daily.    Review of Systems  Constitutional: Negative for fever, chills and appetite change.  HENT: Positive for congestion and postnasal drip.   Eyes: Negative.   Respiratory: Positive for cough. Negative for chest tightness, shortness of breath and wheezing.   Cardiovascular: Negative for chest pain and palpitations.  Gastrointestinal: Negative for nausea, vomiting and abdominal pain.  Endocrine: Negative.   Allergic/Immunologic: Positive for environmental allergies.  Neurological: Negative.   Psychiatric/Behavioral: Positive for agitation. The patient is nervous/anxious.     Social History  Substance Use Topics  . Smoking status: Never Smoker   . Smokeless tobacco: Not on file  . Alcohol Use: No     Comment: rarely   Objective:   BP 120/70 mmHg  Pulse 94  Temp(Src) 97.9 F (36.6 C) (Oral)  Resp 16  Ht 5\' 7"  (1.702 m)  Wt 160 lb (72.576 kg)  BMI 25.05 kg/m2  SpO2 97%  Physical Exam  Constitutional: He is oriented to person, place, and time. He appears  well-developed and well-nourished.  HENT:  Head: Normocephalic and atraumatic.  Right Ear: External ear normal.  Left Ear: External ear normal.  Nose: Nose normal.  Eyes: Conjunctivae are normal.  Neck: Neck supple.  Cardiovascular: Normal rate, regular rhythm and normal heart sounds.   Pulmonary/Chest: Effort normal and breath sounds normal.  Abdominal: Soft.  Neurological: He is alert and oriented to person, place, and time.  Skin: Skin is warm and dry.  Psychiatric: He has a normal mood and affect. His behavior is normal. Thought content normal.  2 atypical nevi onlateral left foor--1 about 82mm across with irregular coloration.     Assessment & Plan:     1. Hypothyroidism, unspecified hypothyroidism type  - levothyroxine (SYNTHROID, LEVOTHROID) 25 MCG tablet; Take 1 tablet (25 mcg total) by mouth daily.  Dispense: 30 tablet; Refill: 11 - rosuvastatin (CRESTOR) 10 MG tablet; Take 1 tablet (10 mg total) by mouth daily.  Dispense: 30 tablet; Refill: 11 - losartan (COZAAR) 50 MG tablet; Take 1 tablet (50 mg total) by mouth daily.  Dispense: 30 tablet; Refill: 11  2. HLD (hyperlipidemia)  - levothyroxine (SYNTHROID, LEVOTHROID) 25 MCG tablet; Take 1 tablet (25 mcg total) by mouth daily.  Dispense: 30 tablet; Refill: 11 - rosuvastatin (CRESTOR) 10 MG tablet; Take 1 tablet (10 mg total) by mouth daily.  Dispense: 30 tablet; Refill: 11 - losartan (COZAAR) 50 MG tablet; Take 1 tablet (50 mg total) by mouth daily.  Dispense: 30 tablet; Refill: 11  3. Atypical nevus  -  Ambulatory referral to Dermatology 4.Bipolar disorder with OCD Pt obsessed with his bowel habits for past 25 years and is unwilling to take psych meds as he feels they all have GI side effects. 4.AR--More than 50% of visit is spent in counseling regarding these issues. 6.GI SE Reassured pt about this.  CPE this summer. I have done the exam and reviewed the above chart and it is accurate to the best of my knowledge.        Karry Barrilleaux Cranford Mon, MD  [facility]

## 2015-12-21 ENCOUNTER — Other Ambulatory Visit: Payer: Self-pay | Admitting: Psychiatry

## 2015-12-21 ENCOUNTER — Telehealth: Payer: Self-pay | Admitting: Emergency Medicine

## 2015-12-21 NOTE — Telephone Encounter (Signed)
Pt came by the office today and wanted to know if he needed labs before his CPE to save him a trip. I told him that we normally do them after the CPE but said he would like to get them before. He has a CPE scheduled for 12/25/15 at 2:00 and since it was so late in the afternoon he did not want to fast that long. He had all labs that we do for CPE done during his hospitalization but he said that he was started on Crestor either in the hospital or at last OV. I was not sure if you wanted to re check lipids this time. Please advise.

## 2015-12-21 NOTE — Telephone Encounter (Signed)
Can do everything but PSA before the visit. Can do labs around the lipids and blood pressure and depression.

## 2015-12-22 NOTE — Telephone Encounter (Signed)
Pt informed. He says he will just wait and do it all after the CPE.

## 2015-12-25 ENCOUNTER — Ambulatory Visit (INDEPENDENT_AMBULATORY_CARE_PROVIDER_SITE_OTHER): Payer: BLUE CROSS/BLUE SHIELD | Admitting: Family Medicine

## 2015-12-25 ENCOUNTER — Encounter: Payer: Self-pay | Admitting: Family Medicine

## 2015-12-25 VITALS — BP 108/72 | HR 67 | Temp 97.9°F | Resp 14 | Ht 66.75 in | Wt 161.0 lb

## 2015-12-25 DIAGNOSIS — E038 Other specified hypothyroidism: Secondary | ICD-10-CM

## 2015-12-25 DIAGNOSIS — I10 Essential (primary) hypertension: Secondary | ICD-10-CM

## 2015-12-25 DIAGNOSIS — Z0000 Encounter for general adult medical examination without abnormal findings: Secondary | ICD-10-CM | POA: Diagnosis not present

## 2015-12-25 DIAGNOSIS — Z1211 Encounter for screening for malignant neoplasm of colon: Secondary | ICD-10-CM | POA: Diagnosis not present

## 2015-12-25 DIAGNOSIS — Z125 Encounter for screening for malignant neoplasm of prostate: Secondary | ICD-10-CM

## 2015-12-25 DIAGNOSIS — F332 Major depressive disorder, recurrent severe without psychotic features: Secondary | ICD-10-CM | POA: Diagnosis not present

## 2015-12-25 MED ORDER — RANITIDINE HCL 150 MG PO CAPS: 150.0000 mg | ORAL_CAPSULE | Freq: Every evening | ORAL | Status: DC

## 2015-12-25 NOTE — Progress Notes (Signed)
Patient: William Lumpkin., Male    DOB: Jun 13, 1960, 55 y.o.   MRN: [id number] Visit Date: 12/25/2015  Today's Provider: Wilhemena Durie, MD   Chief Complaint  Patient presents with  . Annual Exam   Subjective:  William Coleman. is a 55 y.o. male who presents today for health maintenance and complete physical. He feels fairly well. He reports exercising no. He reports he is sleeping well.  Never had Colonoscopy.  Tdap 07/24/07  Review of Systems  Constitutional: Positive for activity change, fatigue and unexpected weight change.  HENT: Positive for congestion and hearing loss.   Eyes: Negative.   Respiratory: Positive for cough.   Cardiovascular: Negative.   Gastrointestinal: Positive for vomiting and abdominal distention.  Endocrine: Negative.   Genitourinary: Negative.   Musculoskeletal: Negative.   Skin: Negative.   Allergic/Immunologic: Negative.   Neurological: Negative.   Hematological: Negative.   Psychiatric/Behavioral: Positive for dysphoric mood.  Patient for the greater than 20 years of care for him is been very focused on his bowels. He has trouble with his depression as he feels all of the psychiatric medicines have GI side effects. He is focused on soft bowel movements and on refluxing at night and occasionally gagging during the night. He cannot tell me how often this happens but states occasionally he brings up "brown" liquid/phlegm  Social History   Social History  . Marital Status: Single    Spouse Name: N/A  . Number of Children: N/A  . Years of Education: N/A   Occupational History  . Not on file.   Social History Main Topics  . Smoking status: Never Smoker   . Smokeless tobacco: Never Used  . Alcohol Use: No     Comment: rarely  . Drug Use: No  . Sexual Activity: No   Other Topics Concern  . Not on file   Social History Narrative    Patient Active Problem List   Diagnosis Date Noted  . Severe recurrent major depression without  psychotic features ([place]) 11/10/2015  . Suicidal ideation 11/10/2015  . Allergic rhinitis 11/10/2014  . HLD (hyperlipidemia) 11/10/2014  . HTN (hypertension) 11/10/2014  . Adult hypothyroidism 11/10/2014  . 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/10/2014  . Obstructive apnea 11/10/2014    Past Surgical History  Procedure Laterality Date  . Nasal septum surgery    . Mouth surgery      orthodontic surgery for underbite  . Myringotomy with tube placement      His family history includes Anxiety disorder in his mother; Congestive Heart Failure in his father; Depression in his mother; Hypertension in his father; Prostate cancer in his father.    Outpatient Prescriptions Prior to Visit  Medication Sig Dispense Refill  . levothyroxine (SYNTHROID, LEVOTHROID) 25 MCG tablet Take 1 tablet (25 mcg total) by mouth daily. 30 tablet 11  . losartan (COZAAR) 50 MG tablet Take 1 tablet (50 mg total) by mouth daily. 30 tablet 11  . rosuvastatin (CRESTOR) 10 MG tablet Take 1 tablet (10 mg total) by mouth daily. 30 tablet 11  . fluvoxaMINE (LUVOX) 100 MG tablet Take 1 tablet (100 mg total) by mouth at bedtime. (Patient not taking: Reported on 12/04/2015) 30 tablet 0  . hydrochlorothiazide (HYDRODIURIL) 25 MG tablet Take 1 tablet (25 mg total) by mouth daily. (Patient not taking: Reported on 12/04/2015) 30 tablet 0   No facility-administered medications prior to visit.    Patient Care Team: William Coleman., MD as PCP - General (  Family Medicine)     Objective:   Vitals:  Filed Vitals:   12/25/15 1346  BP: 108/72  Pulse: 67  Temp: 97.9 F (36.6 C)  Resp: 14  Height: 5' 6.75" (1.695 m)  Weight: 161 lb (73.029 kg)    Physical Exam  Constitutional: He is oriented to person, place, and time. He appears well-developed and well-nourished.  HENT:  Head: Normocephalic and atraumatic.  Left Ear: External ear normal.  Mouth/Throat: Oropharynx is clear and moist.  Eyes: Conjunctivae are normal. Pupils are equal,  round, and reactive to light.  Neck: Normal range of motion. Neck supple.  Cardiovascular: Normal rate, regular rhythm, normal heart sounds and intact distal pulses.   No murmur heard. Pulmonary/Chest: Effort normal and breath sounds normal. No respiratory distress. He has no wheezes.  Abdominal: Soft. There is no tenderness. There is no rebound.  Genitourinary: Penis normal.  Musculoskeletal: He exhibits no edema or tenderness.  Neurological: He is alert and oriented to person, place, and time.  Skin: Skin is warm and dry. No rash noted. No erythema.  Psychiatric: He has a normal mood and affect.     Depression Screen No flowsheet data found.    Assessment & Plan:    1. Annual physical exam  - CBC with Differential/Platelet - Comprehensive metabolic panel - Lipid Panel With LDL/HDL Ratio - POCT urinalysis dipstick - TSH  2. Prostate cancer screening - PSA  3. Colon cancer screening Refer to GI Refer for colonoscopy and also recurrent vomiting, probably reflux issue. - Ambulatory referral to Gastroenterology  4. Essential hypertension Stable.  5. Other specified hypothyroidism  6. Severe recurrent major depression without psychotic features ([place]) 7. Reflux-type symptoms Refer to GI Patient was seen and examined by Dr. Eulas Post and note was scribed by Theressa Millard, RMA.

## 2015-12-26 ENCOUNTER — Telehealth: Payer: Self-pay

## 2015-12-26 ENCOUNTER — Other Ambulatory Visit: Payer: Self-pay

## 2015-12-26 LAB — COMPREHENSIVE METABOLIC PANEL
A/G RATIO: 1.8 (ref 1.2–2.2)
ALBUMIN: 4.4 g/dL (ref 3.5–5.5)
ALT: 30 IU/L (ref 0–44)
AST: 27 IU/L (ref 0–40)
Alkaline Phosphatase: 72 IU/L (ref 39–117)
BILIRUBIN TOTAL: 0.5 mg/dL (ref 0.0–1.2)
BUN / CREAT RATIO: 16 (ref 9–20)
BUN: 16 mg/dL (ref 6–24)
CHLORIDE: 99 mmol/L (ref 96–106)
CO2: 26 mmol/L (ref 18–29)
Calcium: 9.4 mg/dL (ref 8.7–10.2)
Creatinine, Ser: 0.97 mg/dL (ref 0.76–1.27)
GFR calc non Af Amer: 89 mL/min/{1.73_m2} (ref >59)
GFR, EST AFRICAN AMERICAN: 103 mL/min/{1.73_m2} (ref >59)
Globulin, Total: 2.5 g/dL (ref 1.5–4.5)
Glucose: 90 mg/dL (ref 65–99)
POTASSIUM: 4.2 mmol/L (ref 3.5–5.2)
Sodium: 140 mmol/L (ref 134–144)
TOTAL PROTEIN: 6.9 g/dL (ref 6.0–8.5)

## 2015-12-26 LAB — LIPID PANEL WITH LDL/HDL RATIO
Cholesterol, Total: 245 mg/dL — ABNORMAL HIGH (ref 100–199)
HDL: 43 mg/dL (ref >39)
LDL Calculated: 166 mg/dL — ABNORMAL HIGH (ref 0–99)
LDl/HDL Ratio: 3.9 ratio units — ABNORMAL HIGH (ref 0.0–3.6)
Triglycerides: 179 mg/dL — ABNORMAL HIGH (ref 0–149)
VLDL CHOLESTEROL CAL: 36 mg/dL (ref 5–40)

## 2015-12-26 LAB — CBC WITH DIFFERENTIAL/PLATELET
BASOS: 1 %
Basophils Absolute: 0.1 10*3/uL (ref 0.0–0.2)
EOS (ABSOLUTE): 0.3 10*3/uL (ref 0.0–0.4)
Eos: 4 %
HEMATOCRIT: 42.9 % (ref 37.5–51.0)
HEMOGLOBIN: 14.8 g/dL (ref 12.6–17.7)
IMMATURE GRANS (ABS): 0 10*3/uL (ref 0.0–0.1)
Immature Granulocytes: 0 %
LYMPHS: 23 %
Lymphocytes Absolute: 1.5 10*3/uL (ref 0.7–3.1)
MCH: 31.4 pg (ref 26.6–33.0)
MCHC: 34.5 g/dL (ref 31.5–35.7)
MCV: 91 fL (ref 79–97)
MONOCYTES: 9 %
Monocytes Absolute: 0.5 10*3/uL (ref 0.1–0.9)
NEUTROS ABS: 4 10*3/uL (ref 1.4–7.0)
Neutrophils: 63 %
Platelets: 306 10*3/uL (ref 150–379)
RBC: 4.72 x10E6/uL (ref 4.14–5.80)
RDW: 13.1 % (ref 12.3–15.4)
WBC: 6.4 10*3/uL (ref 3.4–10.8)

## 2015-12-26 LAB — TSH: TSH: 0.678 u[IU]/mL (ref 0.450–4.500)

## 2015-12-26 LAB — PSA: PROSTATE SPECIFIC AG, SERUM: 0.8 ng/mL (ref 0.0–4.0)

## 2015-12-26 NOTE — Telephone Encounter (Signed)
Pt is returning call.  CB#[phone]/MW

## 2015-12-26 NOTE — Telephone Encounter (Signed)
Gastroenterology Pre-Procedure Review  Request Date: 02/05/2016 Requesting Physician:  PATIENT REVIEW QUESTIONS: The patient responded to the following health history questions as indicated:    1. Are you having any GI issues? no 2. Do you have a personal history of Polyps? no 3. Do you have a family history of Colon Cancer or Polyps? no 4. Diabetes Mellitus? no 5. Joint replacements in the past 12 months?no 6. Major health problems in the past 3 months?yes (Behaviorial mental health for 1 week for depression) 7. Any artificial heart valves, MVP, or defibrillator?no    MEDICATIONS & ALLERGIES:    Patient reports the following regarding taking any anticoagulation/antiplatelet therapy:   Plavix, Coumadin, Eliquis, Xarelto, Lovenox, Pradaxa, Brilinta, or Effient? no Aspirin? yes (heart health)  Patient confirms/reports the following medications:  Current Outpatient Prescriptions  Medication Sig Dispense Refill  . acetaminophen (TYLENOL) 500 MG tablet Take 500 mg by mouth every 6 (six) hours as needed.    . ARIPiprazole (ABILIFY) 5 MG tablet Take 1 tablet by mouth daily.  4  . aspirin 81 MG tablet Take 81 mg by mouth daily.    Marland Kitchen levothyroxine (SYNTHROID, LEVOTHROID) 25 MCG tablet Take 1 tablet (25 mcg total) by mouth daily. 30 tablet 11  . losartan (COZAAR) 50 MG tablet Take 1 tablet (50 mg total) by mouth daily. 30 tablet 11  . Multiple Vitamin (MULTIVITAMIN) tablet Take 1 tablet by mouth daily.    . ranitidine (ZANTAC) 150 MG capsule Take 1 capsule (150 mg total) by mouth every evening. 30 capsule 12  . rosuvastatin (CRESTOR) 10 MG tablet Take 1 tablet (10 mg total) by mouth daily. 30 tablet 11  . vitamin C (ASCORBIC ACID) 500 MG tablet Take 500 mg by mouth daily.     No current facility-administered medications for this visit.    Patient confirms/reports the following allergies:  Allergies  Allergen Reactions  . Amoxicillin   . Penicillins   . Sulfa Antibiotics     No  orders of the defined types were placed in this encounter.    AUTHORIZATION INFORMATION Primary Insurance: 1D#: Group #:  Secondary Insurance: 1D#: Group #:  SCHEDULE INFORMATION: Date: 02/05/2016 Time: Location: [facility]

## 2015-12-26 NOTE — Telephone Encounter (Signed)
Pt advised on his voicemail-aa

## 2015-12-26 NOTE — Telephone Encounter (Signed)
LMTCB 12/26/2015  Thanks,   -Mickel Baas

## 2015-12-26 NOTE — Telephone Encounter (Signed)
-----   Message from Jerrol Banana., MD sent at 12/26/2015  8:15 AM EDT ----- Labs okay. Lipids better with fasting blood work.

## 2015-12-26 NOTE — Telephone Encounter (Signed)
Pt advised.   Thanks,   -Laura  

## 2016-01-04 ENCOUNTER — Telehealth: Payer: Self-pay

## 2016-01-04 NOTE — Telephone Encounter (Signed)
Patient called stating he is taking the Losartan and it has been working well to control his blood pressure. He states he has swelling, weight gain and bloating. His sister reccommended that he take something with a diuretic to help with swelling. Patient wants to know if Dr. Rosanna Randy will change him to a blood pressure medication that has a diuretic in it.

## 2016-01-04 NOTE — Telephone Encounter (Signed)
Please advise 

## 2016-01-04 NOTE — Telephone Encounter (Signed)
Change losartan to HCTZ 25 mg every morning, return to clinic 1 month

## 2016-01-05 MED ORDER — HYDROCHLOROTHIAZIDE 25 MG PO TABS: 25.0000 mg | ORAL_TABLET | Freq: Every day | ORAL | 0 refills | Status: DC

## 2016-01-05 NOTE — Telephone Encounter (Signed)
Patient was notified. Rx sent to pharmacy.  

## 2016-01-08 ENCOUNTER — Other Ambulatory Visit: Payer: Self-pay

## 2016-01-08 MED ORDER — HYDROCHLOROTHIAZIDE 25 MG PO TABS: 25.0000 mg | ORAL_TABLET | Freq: Every day | ORAL | 12 refills | Status: DC

## 2016-01-08 NOTE — Telephone Encounter (Signed)
Patient is requesting refills on HCTZ to last until he sees Dr. Rosanna Randy on 02/27/16. Patient reports that he will run out of medication before then. The pharmacy we have listed is correct. Thanks!

## 2016-01-09 DIAGNOSIS — D2261 Melanocytic nevi of right upper limb, including shoulder: Secondary | ICD-10-CM | POA: Diagnosis not present

## 2016-01-12 DIAGNOSIS — F429 Obsessive-compulsive disorder, unspecified: Secondary | ICD-10-CM | POA: Diagnosis not present

## 2016-02-02 NOTE — Discharge Instructions (Signed)

## 2016-02-05 ENCOUNTER — Encounter
Admission: RE | Disposition: A | Discharge status: Home / Self Care | Payer: Self-pay | Source: Ambulatory Visit | Attending: Gastroenterology

## 2016-02-05 ENCOUNTER — Ambulatory Visit: Payer: BLUE CROSS/BLUE SHIELD | Admitting: Anesthesiology

## 2016-02-05 ENCOUNTER — Encounter: Payer: Self-pay | Admitting: *Deleted

## 2016-02-05 ENCOUNTER — Ambulatory Visit
Admission: RE | Admit: 2016-02-05 | Discharge: 2016-02-05 | Disposition: A | Discharge status: Home / Self Care | Payer: BLUE CROSS/BLUE SHIELD | Source: Ambulatory Visit | Attending: Gastroenterology | Admitting: Gastroenterology

## 2016-02-05 DIAGNOSIS — K635 Polyp of colon: Secondary | ICD-10-CM | POA: Diagnosis not present

## 2016-02-05 DIAGNOSIS — D125 Benign neoplasm of sigmoid colon: Secondary | ICD-10-CM | POA: Diagnosis not present

## 2016-02-05 DIAGNOSIS — Z7982 Long term (current) use of aspirin: Secondary | ICD-10-CM | POA: Insufficient documentation

## 2016-02-05 DIAGNOSIS — I10 Essential (primary) hypertension: Secondary | ICD-10-CM | POA: Insufficient documentation

## 2016-02-05 DIAGNOSIS — K648 Other hemorrhoids: Secondary | ICD-10-CM | POA: Diagnosis not present

## 2016-02-05 DIAGNOSIS — F429 Obsessive-compulsive disorder, unspecified: Secondary | ICD-10-CM | POA: Insufficient documentation

## 2016-02-05 DIAGNOSIS — G4730 Sleep apnea, unspecified: Secondary | ICD-10-CM | POA: Diagnosis not present

## 2016-02-05 DIAGNOSIS — F319 Bipolar disorder, unspecified: Secondary | ICD-10-CM | POA: Diagnosis not present

## 2016-02-05 DIAGNOSIS — Z1211 Encounter for screening for malignant neoplasm of colon: Secondary | ICD-10-CM | POA: Diagnosis not present

## 2016-02-05 DIAGNOSIS — E039 Hypothyroidism, unspecified: Secondary | ICD-10-CM | POA: Diagnosis not present

## 2016-02-05 HISTORY — PX: COLONOSCOPY WITH PROPOFOL: SHX5780

## 2016-02-05 HISTORY — PX: POLYPECTOMY: SHX5525

## 2016-02-05 SURGERY — COLONOSCOPY WITH PROPOFOL
Anesthesia: Monitor Anesthesia Care | Site: Rectum | Wound class: Contaminated

## 2016-02-05 MED ORDER — PROPOFOL 10 MG/ML IV BOLUS
INTRAVENOUS | Status: DC | PRN
  Administered 2016-02-05: 70 mg via INTRAVENOUS

## 2016-02-05 MED ORDER — LIDOCAINE HCL (CARDIAC) 20 MG/ML IV SOLN
INTRAVENOUS | Status: DC | PRN
  Administered 2016-02-05 (×3): 20 mg via INTRAVENOUS
  Administered 2016-02-05: 40 mg via INTRAVENOUS
  Administered 2016-02-05: 10 mg via INTRAVENOUS
  Administered 2016-02-05 (×3): 30 mg via INTRAVENOUS
  Administered 2016-02-05: 50 mg via INTRAVENOUS

## 2016-02-05 MED ORDER — STERILE WATER FOR IRRIGATION IR SOLN
Status: DC | PRN
  Administered 2016-02-05: 09:00:00

## 2016-02-05 MED ORDER — LACTATED RINGERS IV SOLN
INTRAVENOUS | Status: DC
  Administered 2016-02-05: 08:00:00 via INTRAVENOUS

## 2016-02-05 SURGICAL SUPPLY — 23 items
CANISTER SUCT 1200ML W/VALVE (MISCELLANEOUS) ×2 IMPLANT
CLIP HMST 235XBRD CATH ROT (MISCELLANEOUS) IMPLANT
CLIP RESOLUTION 360 11X235 (MISCELLANEOUS)
FCP ESCP3.2XJMB 240X2.8X (MISCELLANEOUS)
FORCEPS BIOP RAD 4 LRG CAP 4 (CUTTING FORCEPS) ×2 IMPLANT
FORCEPS BIOP RJ4 240 W/NDL (MISCELLANEOUS)
FORCEPS ESCP3.2XJMB 240X2.8X (MISCELLANEOUS) IMPLANT
GOWN CVR UNV OPN BCK APRN NK (MISCELLANEOUS) ×2 IMPLANT
GOWN ISOL THUMB LOOP REG UNIV (MISCELLANEOUS) ×2
INJECTOR VARIJECT VIN23 (MISCELLANEOUS) IMPLANT
KIT DEFENDO VALVE AND CONN (KITS) IMPLANT
KIT ENDO PROCEDURE OLY (KITS) ×2 IMPLANT
MARKER SPOT ENDO TATTOO 5ML (MISCELLANEOUS) IMPLANT
PAD GROUND ADULT SPLIT (MISCELLANEOUS) IMPLANT
PROBE APC STR FIRE (PROBE) IMPLANT
RETRIEVER NET ROTH 2.5X230 LF (MISCELLANEOUS) ×2 IMPLANT
SNARE SHORT THROW 13M SML OVAL (MISCELLANEOUS) IMPLANT
SNARE SHORT THROW 30M LRG OVAL (MISCELLANEOUS) IMPLANT
SNARE SNG USE RND 15MM (INSTRUMENTS) IMPLANT
SPOT EX ENDOSCOPIC TATTOO (MISCELLANEOUS)
TRAP ETRAP POLY (MISCELLANEOUS) IMPLANT
VARIJECT INJECTOR VIN23 (MISCELLANEOUS)
WATER STERILE IRR 250ML POUR (IV SOLUTION) ×2 IMPLANT

## 2016-02-05 NOTE — Anesthesia Procedure Notes (Signed)
Procedure Name: MAC Performed by: Gilverto Dileonardo Pre-anesthesia Checklist: Patient identified, Emergency Drugs available, Suction available, Timeout performed and Patient being monitored Patient Re-evaluated:Patient Re-evaluated prior to inductionOxygen Delivery Method: Nasal cannula Placement Confirmation: positive ETCO2       

## 2016-02-05 NOTE — Transfer of Care (Signed)
Immediate Anesthesia Transfer of Care Note  Patient: William Coleman.  Procedure(s) Performed: Procedure(s): COLONOSCOPY WITH PROPOFOL (N/A) POLYPECTOMY (N/A)  Patient Location: PACU  Anesthesia Type: MAC  Level of Consciousness: awake, alert  and patient cooperative  Airway and Oxygen Therapy: Patient Spontanous Breathing and Patient connected to supplemental oxygen  Post-op Assessment: Post-op Vital signs reviewed, Patient's Cardiovascular Status Stable, Respiratory Function Stable, Patent Airway and No signs of Nausea or vomiting  Post-op Vital Signs: Reviewed and stable  Complications: No apparent anesthesia complications

## 2016-02-05 NOTE — Anesthesia Preprocedure Evaluation (Signed)
Anesthesia Evaluation  Patient identified by MRN, date of birth, ID band Patient awake    Reviewed: Allergy & Precautions, H&P , NPO status , Patient's Chart, lab work & pertinent test results  History of Anesthesia Complications Negative for: history of anesthetic complications  Airway Mallampati: II  TM Distance: >3 FB Neck ROM: Full    Dental no notable dental hx.    Pulmonary sleep apnea ,    Pulmonary exam normal        Cardiovascular hypertension, On Medications Normal cardiovascular exam     Neuro/Psych PSYCHIATRIC DISORDERS    GI/Hepatic negative GI ROS, Neg liver ROS,   Endo/Other  Hypothyroidism   Renal/GU   negative genitourinary   Musculoskeletal   Abdominal   Peds  Hematology negative hematology ROS (+)   Anesthesia Other Findings   Reproductive/Obstetrics negative OB ROS                             Anesthesia Physical Anesthesia Plan  ASA: II  Anesthesia Plan: MAC   Post-op Pain Management:    Induction:   Airway Management Planned:   Additional Equipment:   Intra-op Plan:   Post-operative Plan:   Informed Consent: I have reviewed the patients History and Physical, chart, labs and discussed the procedure including the risks, benefits and alternatives for the proposed anesthesia with the patient or authorized representative who has indicated his/her understanding and acceptance.     Plan Discussed with:   Anesthesia Plan Comments:         Anesthesia Quick Evaluation

## 2016-02-05 NOTE — Anesthesia Postprocedure Evaluation (Signed)
Anesthesia Post Note  Patient: William Coleman.  Procedure(s) Performed: Procedure(s) (LRB): COLONOSCOPY WITH PROPOFOL (N/A) POLYPECTOMY (N/A)  Patient location during evaluation: PACU Anesthesia Type: MAC Level of consciousness: awake Pain management: pain level controlled Vital Signs Assessment: post-procedure vital signs reviewed and stable Respiratory status: spontaneous breathing Cardiovascular status: stable Postop Assessment: no headache Anesthetic complications: no    Jaci Standard, III,  Daysi Boggan D

## 2016-02-05 NOTE — H&P (Signed)
[name], MD [address], [place] [address] Phone: [phone] Fax : [phone]  Primary Care Physician:  Wilhemena Durie, MD Primary Gastroenterologist:  Dr. Allen Norris  Pre-Procedure History & Physical: HPI:  William Pearo. is a 55 y.o. male is here for a screening colonoscopy.   Past Medical History:  Diagnosis Date  . Anginal pain ([place])   . Bipolar disorder ([place])   . Depression   . Hypertension   . Hypothyroidism   . Obsessive compulsive disorder   . Sleep apnea    patient states it was "marginal", no CPAP    Past Surgical History:  Procedure Laterality Date  . MOUTH SURGERY     orthodontic surgery for underbite  . MYRINGOTOMY WITH TUBE PLACEMENT    . NASAL SEPTUM SURGERY      Prior to Admission medications   Medication Sig Start Date End Date Taking? Authorizing Provider  acetaminophen (TYLENOL) 500 MG tablet Take 500 mg by mouth every 6 (six) hours as needed.   Yes Historical Provider, MD  ARIPiprazole (ABILIFY) 5 MG tablet Take 1 tablet by mouth daily. 12/06/15  Yes Historical Provider, MD  aspirin 81 MG tablet Take 81 mg by mouth daily.   Yes Historical Provider, MD  hydrochlorothiazide (HYDRODIURIL) 25 MG tablet Take 1 tablet (25 mg total) by mouth daily. 01/08/16  Yes Richard Maceo Pro., MD  levothyroxine (SYNTHROID, LEVOTHROID) 25 MCG tablet Take 1 tablet (25 mcg total) by mouth daily. 12/04/15  Yes Richard Maceo Pro., MD  Multiple Vitamin (MULTIVITAMIN) tablet Take 1 tablet by mouth daily.   Yes Historical Provider, MD  ranitidine (ZANTAC) 150 MG capsule Take 1 capsule (150 mg total) by mouth every evening. 12/25/15  Yes Richard Maceo Pro., MD  rosuvastatin (CRESTOR) 10 MG tablet Take 1 tablet (10 mg total) by mouth daily. 12/04/15  Yes Richard Maceo Pro., MD  vitamin C (ASCORBIC ACID) 500 MG tablet Take 500 mg by mouth daily.   Yes Historical Provider, MD  losartan (COZAAR) 50 MG tablet Take 1 tablet (50 mg total) by  mouth daily. Patient not taking: Reported on 01/30/2016 12/04/15   Jerrol Banana., MD    Allergies as of 12/26/2015 - Review Complete 12/25/2015  Allergen Reaction Noted  . Amoxicillin  11/10/2014  . Penicillins  11/10/2014  . Sulfa antibiotics  11/10/2014    Family History  Problem Relation Age of Onset  . Depression Mother   . Anxiety disorder Mother   . Congestive Heart Failure Father   . Prostate cancer Father   . Hypertension Father     Social History   Social History  . Marital status: Single    Spouse name: N/A  . Number of children: N/A  . Years of education: N/A   Occupational History  . Not on file.   Social History Main Topics  . Smoking status: Never Smoker  . Smokeless tobacco: Never Used  . Alcohol use No     Comment: rarely  . Drug use: No  . Sexual activity: No   Other Topics Concern  . Not on file   Social History Narrative  . No narrative on file    Review of Systems: See HPI, otherwise negative ROS  Physical Exam: BP (!) 145/109 Comment: pt states had a recent change in Bp meds  Pulse 82   Temp 98.7 F (37.1 C)   Resp 16   Ht 5\' 7"  (1.702 m)  Wt 159 lb (72.1 kg)   SpO2 97%   BMI 24.90 kg/m  General:   Alert,  pleasant and cooperative in NAD Head:  Normocephalic and atraumatic. Neck:  Supple; no masses or thyromegaly. Lungs:  Clear throughout to auscultation.    Heart:  Regular rate and rhythm. Abdomen:  Soft, nontender and nondistended. Normal bowel sounds, without guarding, and without rebound.   Neurologic:  Alert and  oriented x4;  grossly normal neurologically.  Impression/Plan: William Coleman. is now here to undergo a screening colonoscopy.  Risks, benefits, and alternatives regarding colonoscopy have been reviewed with the patient.  Questions have been answered.  All parties agreeable.

## 2016-02-05 NOTE — Op Note (Signed)
[facility] Patient Name: William Coleman Procedure Date: 02/05/2016 8:16 AM MRN: [id number] Account #: [id number] Date of Birth: Oct 05, 1960 Admit Type: Outpatient Age: 55 Room: [facility] OR ROOM 01 Gender: Male Note Status: Finalized Procedure:            Colonoscopy Indications:          Screening for colorectal malignant neoplasm Providers:            [name] MD, MD Referring MD:         Janine Ores. Rosanna Randy, MD (Referring MD) Medicines:            Propofol per Anesthesia Complications:        No immediate complications. Procedure:            Pre-Anesthesia Assessment:                       - Prior to the procedure, a History and Physical was                        performed, and patient medications and allergies were                        reviewed. The patient's tolerance of previous                        anesthesia was also reviewed. The risks and benefits of                        the procedure and the sedation options and risks were                        discussed with the patient. All questions were                        answered, and informed consent was obtained. Prior                        Anticoagulants: The patient has taken no previous                        anticoagulant or antiplatelet agents. ASA Grade                        Assessment: II - A patient with mild systemic disease.                        After reviewing the risks and benefits, the patient was                        deemed in satisfactory condition to undergo the                        procedure.                       After obtaining informed consent, the colonoscope was                        passed under direct vision. Throughout the procedure,  the patient's blood pressure, pulse, and oxygen                        saturations were monitored continuously. The Olympus                        CF-HQ190L Colonoscope (S#. [id number]) was introduced                       through the anus and advanced to the the cecum,                        identified by appendiceal orifice and ileocecal valve.                        The colonoscopy was performed without difficulty. The                        patient tolerated the procedure well. The quality of                        the bowel preparation was excellent. Findings:      The perianal and digital rectal examinations were normal.      A 2 mm polyp was found in the sigmoid colon. The polyp was sessile. The       polyp was removed with a cold biopsy forceps. Resection and retrieval       were complete.      Non-bleeding internal hemorrhoids were found during retroflexion. The       hemorrhoids were Grade II (internal hemorrhoids that prolapse but reduce       spontaneously). Impression:           - One 2 mm polyp in the sigmoid colon, removed with a                        cold biopsy forceps. Resected and retrieved.                       - Non-bleeding internal hemorrhoids. Recommendation:       - Await pathology results.                       - Repeat colonoscopy in 5 years if polyp adenoma and 10                        years if hyperplastic Procedure Code(s):    --- Professional ---                       [phone], Colonoscopy, flexible; with biopsy, single or                        multiple Diagnosis Code(s):    --- Professional ---                       Z12.11, Encounter for screening for malignant neoplasm                        of colon  D12.5, Benign neoplasm of sigmoid colon CPT copyright 2016 American Medical Association. All rights reserved. The codes documented in this report are preliminary and upon coder review may  be revised to meet current compliance requirements. [name] MD, MD 02/05/2016 8:37:11 AM This report has been signed electronically. Number of Addenda: 0 Note Initiated On: 02/05/2016 8:16 AM Scope Withdrawal Time: 0 hours 7 minutes 18 seconds   Total Procedure Duration: 0 hours 10 minutes 29 seconds       [facility]

## 2016-02-06 ENCOUNTER — Encounter: Payer: Self-pay | Admitting: Gastroenterology

## 2016-02-07 ENCOUNTER — Encounter: Payer: Self-pay | Admitting: Gastroenterology

## 2016-02-08 ENCOUNTER — Encounter: Payer: Self-pay | Admitting: Gastroenterology

## 2016-02-13 DIAGNOSIS — F429 Obsessive-compulsive disorder, unspecified: Secondary | ICD-10-CM | POA: Diagnosis not present

## 2016-02-20 DIAGNOSIS — F429 Obsessive-compulsive disorder, unspecified: Secondary | ICD-10-CM | POA: Diagnosis not present

## 2016-02-27 ENCOUNTER — Ambulatory Visit (INDEPENDENT_AMBULATORY_CARE_PROVIDER_SITE_OTHER): Payer: BLUE CROSS/BLUE SHIELD | Admitting: Family Medicine

## 2016-02-27 VITALS — BP 144/86 | HR 86 | Temp 97.6°F | Wt 168.0 lb

## 2016-02-27 DIAGNOSIS — Z202 Contact with and (suspected) exposure to infections with a predominantly sexual mode of transmission: Secondary | ICD-10-CM | POA: Diagnosis not present

## 2016-02-27 DIAGNOSIS — R635 Abnormal weight gain: Secondary | ICD-10-CM

## 2016-02-27 DIAGNOSIS — Z23 Encounter for immunization: Secondary | ICD-10-CM | POA: Diagnosis not present

## 2016-02-27 NOTE — Progress Notes (Signed)
William Coleman.  MRN: [id number] DOB: [birth year]  Subjective:  HPI   The patient is a 55 year old male who presents for follow up of his hypertension.  He was last seen on 12/25/15 for CPE.  At that time he complained of increased swelling on the Losartan.  He was instructed to start HCTZ.  The patient stopped taking the Lopsartan and is just on the HCTZ currently.  He states the the swelling is worse than it was before.  The patient had his BP checked at the psychiatrist's office 1 week ago and states his BP that day was 152/104.  The patient would also like to have STD screening done.  He states he had unprotected sex a few months ago.  He is not having any symptoms and the person he was with has not said anything to him about having any issues.    Patient Active Problem List   Diagnosis Date Noted  . Special screening for malignant neoplasms, colon   . Benign neoplasm of sigmoid colon   . Severe recurrent major depression without psychotic features ([place]) 11/10/2015  . Suicidal ideation 11/10/2015  . Allergic rhinitis 11/10/2014  . HLD (hyperlipidemia) 11/10/2014  . HTN (hypertension) 11/10/2014  . Adult hypothyroidism 11/10/2014  . 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/10/2014  . Obstructive apnea 11/10/2014    Past Medical History:  Diagnosis Date  . Anginal pain ([place])   . Bipolar disorder ([place])   . Depression   . Hypertension   . Hypothyroidism   . Obsessive compulsive disorder   . Sleep apnea    patient states it was "marginal", no CPAP    Social History   Social History  . Marital status: Single    Spouse name: N/A  . Number of children: N/A  . Years of education: N/A   Occupational History  . Not on file.   Social History Main Topics  . Smoking status: Never Smoker  . Smokeless tobacco: Never Used  . Alcohol use No     Comment: rarely  . Drug use: No  . Sexual activity: No   Other Topics Concern  . Not on file   Social History Narrative  . No narrative on file      Outpatient Encounter Prescriptions as of 02/27/2016  Medication Sig Note  . acetaminophen (TYLENOL) 500 MG tablet Take 500 mg by mouth every 6 (six) hours as needed.   . ARIPiprazole (ABILIFY) 5 MG tablet Take 1 tablet by mouth daily. 12/25/2015: Received from: External Pharmacy Received Sig:   . aspirin 81 MG tablet Take 81 mg by mouth daily.   . hydrochlorothiazide (HYDRODIURIL) 25 MG tablet Take 1 tablet (25 mg total) by mouth daily.   Marland Kitchen levothyroxine (SYNTHROID, LEVOTHROID) 25 MCG tablet Take 1 tablet (25 mcg total) by mouth daily.   . Multiple Vitamin (MULTIVITAMIN) tablet Take 1 tablet by mouth daily.   . ranitidine (ZANTAC) 150 MG capsule Take 1 capsule (150 mg total) by mouth every evening.   . rosuvastatin (CRESTOR) 10 MG tablet Take 1 tablet (10 mg total) by mouth daily.   . vitamin C (ASCORBIC ACID) 500 MG tablet Take 500 mg by mouth daily.   Marland Kitchen losartan (COZAAR) 50 MG tablet Take 1 tablet (50 mg total) by mouth daily. (Patient not taking: Reported on 02/27/2016)    No facility-administered encounter medications on file as of 02/27/2016.     Allergies  Allergen Reactions  . Amoxicillin Hives  . Penicillins   .  Sulfa Antibiotics     Review of Systems  Constitutional: Negative for fever and weight loss.  Eyes: Negative.   Respiratory: Negative for cough, shortness of breath and wheezing.   Cardiovascular: Positive for leg swelling (ankles, chronic unchanged). Negative for chest pain, palpitations and orthopnea.  Gastrointestinal: Negative.   Genitourinary: Negative for dysuria.  Musculoskeletal: Negative.   Neurological: Negative.  Negative for dizziness, weakness and headaches.  Endo/Heme/Allergies: Negative.    Objective:  BP (!) 144/86   Pulse 86   Temp 97.6 F (36.4 C) (Oral)   Wt 168 lb (76.2 kg)   BMI 26.31 kg/m   Physical Exam  Constitutional: He is oriented to person, place, and time and well-developed, well-nourished, and in no distress.  HENT:  Head:  Normocephalic and atraumatic.  Right Ear: External ear normal.  Left Ear: External ear normal.  Nose: Nose normal.  Eyes: Conjunctivae are normal.  Neck: Neck supple.  Cardiovascular: Normal rate, regular rhythm and normal heart sounds.   Pulmonary/Chest: Effort normal and breath sounds normal.  Abdominal: Soft.  Neurological: He is alert and oriented to person, place, and time.  Skin: Skin is warm and dry.  Psychiatric: Mood and affect normal.    Assessment and Plan :  No diagnosis found. Major depressive disorder It is of note the patient has been at worse noncompliant and at best willing to change antidepressant therapy because of perceived side effects. He is concerned about the weight gain that he is having and I told him that it is  likely due to Abilify. I instructed him not to stop the Abilify. I told him it is worth the side effects to have his depression and anxiety controlled. Chronic anxiety Hypertension Improving control Weight gain Likely due to Abilify More than 25 minutes spent on this visit overall and 50% or more was spent in counseling Possible STDs exposure  I have done the exam and reviewed the chart and it is accurate to the best of my knowledge. Miguel Aschoff M.D. [facility]

## 2016-02-28 LAB — COMPREHENSIVE METABOLIC PANEL
A/G RATIO: 1.8 (ref 1.2–2.2)
ALK PHOS: 74 IU/L (ref 39–117)
ALT: 48 IU/L — ABNORMAL HIGH (ref 0–44)
AST: 33 IU/L (ref 0–40)
Albumin: 4.6 g/dL (ref 3.5–5.5)
BUN / CREAT RATIO: 15 (ref 9–20)
BUN: 15 mg/dL (ref 6–24)
Bilirubin Total: 0.4 mg/dL (ref 0.0–1.2)
CO2: 28 mmol/L (ref 18–29)
Calcium: 10.4 mg/dL — ABNORMAL HIGH (ref 8.7–10.2)
Chloride: 94 mmol/L — ABNORMAL LOW (ref 96–106)
Creatinine, Ser: 0.97 mg/dL (ref 0.76–1.27)
GFR calc Af Amer: 102 mL/min/{1.73_m2} (ref >59)
GFR calc non Af Amer: 88 mL/min/{1.73_m2} (ref >59)
GLOBULIN, TOTAL: 2.6 g/dL (ref 1.5–4.5)
Glucose: 85 mg/dL (ref 65–99)
POTASSIUM: 3.4 mmol/L — AB (ref 3.5–5.2)
SODIUM: 139 mmol/L (ref 134–144)
Total Protein: 7.2 g/dL (ref 6.0–8.5)

## 2016-02-28 LAB — HEP, RPR, HIV PANEL
HEP B S AG: NEGATIVE
HIV Screen 4th Generation wRfx: NONREACTIVE
RPR: NONREACTIVE

## 2016-02-28 LAB — TSH: TSH: 1.23 u[IU]/mL (ref 0.450–4.500)

## 2016-02-29 LAB — GC/CHLAMYDIA PROBE AMP
Chlamydia trachomatis, NAA: NEGATIVE
NEISSERIA GONORRHOEAE BY PCR: NEGATIVE

## 2016-03-01 ENCOUNTER — Telehealth: Payer: Self-pay | Admitting: Emergency Medicine

## 2016-03-01 NOTE — Telephone Encounter (Signed)
Pt informed of lab results. Pt wanted to know if you wanted him to restart the Losartan instead of the HCTZ. He said we had stopped it due to swelling at his last OV. Please advise. He is aware that you are out of the office this afternoon.

## 2016-03-05 DIAGNOSIS — F429 Obsessive-compulsive disorder, unspecified: Secondary | ICD-10-CM | POA: Diagnosis not present

## 2016-04-02 DIAGNOSIS — F429 Obsessive-compulsive disorder, unspecified: Secondary | ICD-10-CM | POA: Diagnosis not present

## 2016-04-08 DIAGNOSIS — J019 Acute sinusitis, unspecified: Secondary | ICD-10-CM | POA: Diagnosis not present

## 2016-04-15 ENCOUNTER — Other Ambulatory Visit: Payer: Self-pay | Admitting: Unknown Physician Specialty

## 2016-04-15 DIAGNOSIS — J329 Chronic sinusitis, unspecified: Secondary | ICD-10-CM

## 2016-04-16 ENCOUNTER — Ambulatory Visit
Admission: RE | Admit: 2016-04-16 | Discharge: 2016-04-16 | Disposition: A | Discharge status: Home / Self Care | Payer: BLUE CROSS/BLUE SHIELD | Source: Ambulatory Visit | Attending: Unknown Physician Specialty | Admitting: Unknown Physician Specialty

## 2016-04-16 DIAGNOSIS — J3489 Other specified disorders of nose and nasal sinuses: Secondary | ICD-10-CM | POA: Diagnosis not present

## 2016-04-16 DIAGNOSIS — J329 Chronic sinusitis, unspecified: Secondary | ICD-10-CM

## 2016-04-17 ENCOUNTER — Ambulatory Visit (INDEPENDENT_AMBULATORY_CARE_PROVIDER_SITE_OTHER): Payer: BLUE CROSS/BLUE SHIELD | Admitting: Family Medicine

## 2016-04-17 ENCOUNTER — Encounter: Payer: Self-pay | Admitting: Family Medicine

## 2016-04-17 ENCOUNTER — Ambulatory Visit
Admission: RE | Admit: 2016-04-17 | Discharge: 2016-04-17 | Disposition: A | Discharge status: Home / Self Care | Payer: BLUE CROSS/BLUE SHIELD | Source: Ambulatory Visit | Attending: Family Medicine | Admitting: Family Medicine

## 2016-04-17 ENCOUNTER — Telehealth: Payer: Self-pay

## 2016-04-17 VITALS — BP 118/84 | HR 90 | Temp 97.7°F | Resp 16 | Wt 162.0 lb

## 2016-04-17 DIAGNOSIS — B9789 Other viral agents as the cause of diseases classified elsewhere: Secondary | ICD-10-CM | POA: Diagnosis not present

## 2016-04-17 DIAGNOSIS — J069 Acute upper respiratory infection, unspecified: Secondary | ICD-10-CM

## 2016-04-17 DIAGNOSIS — R05 Cough: Secondary | ICD-10-CM | POA: Insufficient documentation

## 2016-04-17 DIAGNOSIS — R0602 Shortness of breath: Secondary | ICD-10-CM | POA: Diagnosis not present

## 2016-04-17 MED ORDER — HYDROCODONE-HOMATROPINE 5-1.5 MG/5ML PO SYRP: ORAL_SOLUTION | ORAL | 0 refills | Status: DC

## 2016-04-17 NOTE — Telephone Encounter (Signed)
Advised pt of results. Pt verbally expresses understanding. Renaldo Fiddler, CMA

## 2016-04-17 NOTE — Patient Instructions (Addendum)
Try Sudafed during the day and Benadryl at night. We will call you with the x-ray report.

## 2016-04-17 NOTE — Progress Notes (Signed)
Subjective:     Patient ID: William Jumbo., male   DOB: 1960-07-05, 55 y.o.   MRN: [id number]  HPI  Chief Complaint  Patient presents with  . Cough    Has been occuring more than  2 weeks. Has seen ENT, who prescribed prednisone and clarithromycin. Has finished steroids, but is still taking abx, without relief. Pt states sx are worsening. Is also c/o rhinorrhea. Is requesting CXR. States he had a CT done of his sinuses, which came back negative.   Reports clear sinus drainage and gagging type cough which has been occasionally productive of yellowish sputum."This is the worst sinus infection I have had in my life." He is concerned that he may have pneumonia   Review of Systems  Constitutional: Negative for chills and fever.       Objective:   Physical Exam  Constitutional: He appears well-developed and well-nourished. No distress.  Ears: T.M's intact without inflammation Throat: no tonsillar enlargement or exudate Neck: no cervical adenopathy Lungs: clear     Assessment:    1. Viral upper respiratory tract infection - DG Chest 2 View; Future - HYDROcodone-homatropine (HYCODAN) 5-1.5 MG/5ML syrup; 5 ml 4-6 hours as needed for cough  Dispense: 240 mL; Refill: 0    Plan:    Discussed use of decongestants and Benadryl at night. Further f/u pending x-ray report.

## 2016-04-17 NOTE — Telephone Encounter (Signed)
-----   Message from Carmon Ginsberg, [place] sent at 04/17/2016 11:33 AM EST ----- No pneumonia. X-ray is clear.Continue with plan we discussed in the office. Go ahead and complete the antibiotics you have started.

## 2016-04-22 ENCOUNTER — Ambulatory Visit (INDEPENDENT_AMBULATORY_CARE_PROVIDER_SITE_OTHER): Payer: BLUE CROSS/BLUE SHIELD | Admitting: Family Medicine

## 2016-04-22 VITALS — BP 130/80 | HR 74 | Temp 98.1°F | Resp 18 | Wt 161.0 lb

## 2016-04-22 DIAGNOSIS — R053 Chronic cough: Secondary | ICD-10-CM

## 2016-04-22 DIAGNOSIS — R05 Cough: Secondary | ICD-10-CM | POA: Diagnosis not present

## 2016-04-22 MED ORDER — FLUTICASONE-SALMETEROL 100-50 MCG/DOSE IN AEPB: 1.0000 | INHALATION_SPRAY | Freq: Two times a day (BID) | RESPIRATORY_TRACT | 3 refills | Status: DC

## 2016-04-22 NOTE — Progress Notes (Signed)
William Coleman.  MRN: [id number] DOB: January 31, 1961  Subjective:  HPI   The patient is a 55 year old male who presents for evaluation of persistent viral/sinusitis/cough symptoms.  The patient states he has been out of work for 2 weeks with these symptoms.  He was seen by ENT 2 weeks ago today and was given antibiotic, which he finished today and a course of Prednisone.  He ws then seen by a PA in our office last week and was treated with Hycodan and Benadryl per the patient.  The patient states that his symptoms are not any better and he will need to get some kind of note for work to go on a medical leave if he needs to be out any longer. No orthopnea, no chest pain. No fever, no hemoptysis.  Patient Active Problem List   Diagnosis Date Noted  . Special screening for malignant neoplasms, colon   . Benign neoplasm of sigmoid colon   . Severe recurrent major depression without psychotic features ([place]) 11/10/2015  . Suicidal ideation 11/10/2015  . Allergic rhinitis 11/10/2014  . HLD (hyperlipidemia) 11/10/2014  . HTN (hypertension) 11/10/2014  . Adult hypothyroidism 11/10/2014  . 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/10/2014  . Obstructive apnea 11/10/2014    Past Medical History:  Diagnosis Date  . Anginal pain ([place])   . Bipolar disorder ([place])   . Depression   . Hypertension   . Hypothyroidism   . Obsessive compulsive disorder   . Sleep apnea    patient states it was "marginal", no CPAP    Social History   Social History  . Marital status: Single    Spouse name: N/A  . Number of children: N/A  . Years of education: N/A   Occupational History  . Not on file.   Social History Main Topics  . Smoking status: Never Smoker  . Smokeless tobacco: Never Used  . Alcohol use Yes     Comment: rarely  . Drug use: No  . Sexual activity: No   Other Topics Concern  . Not on file   Social History Narrative  . No narrative on file    Outpatient Encounter Prescriptions as of 04/22/2016    Medication Sig Note  . acetaminophen (TYLENOL) 500 MG tablet Take 500 mg by mouth every 6 (six) hours as needed.   Marland Kitchen aspirin 81 MG tablet Take 81 mg by mouth daily.   Marland Kitchen FLUARIX QUADRIVALENT 0.5 ML injection inject 0.5 milliliter intramuscularly 04/17/2016: Received from: External Pharmacy  . hydrochlorothiazide (HYDRODIURIL) 25 MG tablet Take 1 tablet (25 mg total) by mouth daily.   Marland Kitchen HYDROcodone-homatropine (HYCODAN) 5-1.5 MG/5ML syrup 5 ml 4-6 hours as needed for cough   . levothyroxine (SYNTHROID, LEVOTHROID) 25 MCG tablet Take 1 tablet (25 mcg total) by mouth daily.   Marland Kitchen losartan (COZAAR) 50 MG tablet Take 1 tablet (50 mg total) by mouth daily.   . Multiple Vitamin (MULTIVITAMIN) tablet Take 1 tablet by mouth daily.   . ranitidine (ZANTAC) 150 MG capsule Take 1 capsule (150 mg total) by mouth every evening.   . rosuvastatin (CRESTOR) 10 MG tablet Take 1 tablet (10 mg total) by mouth daily.   . vitamin C (ASCORBIC ACID) 500 MG tablet Take 500 mg by mouth daily.   . [DISCONTINUED] clarithromycin (BIAXIN) 500 MG tablet  04/17/2016: Received from: External Pharmacy   No facility-administered encounter medications on file as of 04/22/2016.     Allergies  Allergen Reactions  . Amoxicillin Hives  .  Penicillins   . Sulfa Antibiotics     Review of Systems  Constitutional: Positive for malaise/fatigue. Negative for chills and fever.  Respiratory: Positive for cough and sputum production (minimal). Negative for shortness of breath and wheezing.   Cardiovascular: Negative for chest pain, palpitations and orthopnea.  Gastrointestinal: Negative.   Neurological: Positive for weakness and headaches (sinus). Negative for dizziness.  Endo/Heme/Allergies: Negative.   Psychiatric/Behavioral: Negative.     Objective:  BP 130/80 (BP Location: Right Arm, Patient Position: Sitting, Cuff Size: Normal)   Pulse 74   Temp 98.1 F (36.7 C) (Oral)   Resp 18   Wt 161 lb (73 kg)   BMI 25.22 kg/m    Physical Exam  Constitutional: He is oriented to person, place, and time and well-developed, well-nourished, and in no distress.  HENT:  Head: Normocephalic and atraumatic.  Right Ear: External ear normal.  Left Ear: External ear normal.  Mouth/Throat: Oropharynx is clear and moist.  Eyes: Conjunctivae are normal. Pupils are equal, round, and reactive to light.  Neck: Normal range of motion. Neck supple.  Cardiovascular: Normal rate, regular rhythm and normal heart sounds.   Pulmonary/Chest: Effort normal and breath sounds normal.  Abdominal: Soft.  Neurological: He is alert and oriented to person, place, and time. No cranial nerve deficit. He exhibits normal muscle tone. Gait normal.  Skin: Skin is warm and dry.  Psychiatric: Mood, memory and affect normal.    Assessment and Plan :  1. Persistent cough - Ambulatory referral to Pulmonology We will treat as allergies and will try Advair to see if this helps the patient. Chest x-ray from one week ago was normal. 2. Recurrent major depressive episode chronic anxiety The patient has had a URI and cough but the patient's past pattern his tube was obsessed with physical symptoms related to this been GI and bowel movements.    HPI, Exam and A&P Transcribed under the direction and in the presence of Miguel Aschoff, Brooke Bonito., MD. Electronically Signed: Althea Charon, RMA I have done the exam and reviewed the chart and it is accurate to the best of my knowledge. Development worker, community has been used and  any errors in dictation or transcription are unintentional. Miguel Aschoff M.D. [facility]

## 2016-04-23 ENCOUNTER — Ambulatory Visit: Payer: Self-pay | Admitting: Family Medicine

## 2016-04-30 ENCOUNTER — Ambulatory Visit (INDEPENDENT_AMBULATORY_CARE_PROVIDER_SITE_OTHER): Payer: BLUE CROSS/BLUE SHIELD | Admitting: Family Medicine

## 2016-04-30 ENCOUNTER — Encounter: Payer: Self-pay | Admitting: Family Medicine

## 2016-04-30 VITALS — BP 120/82 | HR 74 | Temp 98.0°F | Resp 20 | Wt 163.0 lb

## 2016-04-30 DIAGNOSIS — R05 Cough: Secondary | ICD-10-CM | POA: Diagnosis not present

## 2016-04-30 DIAGNOSIS — Z8719 Personal history of other diseases of the digestive system: Secondary | ICD-10-CM | POA: Diagnosis not present

## 2016-04-30 DIAGNOSIS — R053 Chronic cough: Secondary | ICD-10-CM

## 2016-04-30 DIAGNOSIS — F429 Obsessive-compulsive disorder, unspecified: Secondary | ICD-10-CM | POA: Diagnosis not present

## 2016-04-30 NOTE — Patient Instructions (Signed)
Start Zantac 300 mg.at bedtime.

## 2016-04-30 NOTE — Progress Notes (Signed)
Subjective:     Patient ID: William Coleman., male   DOB: Jan 18, 1961, 55 y.o.   MRN: [id number]  HPI  Chief Complaint  Patient presents with  . Cough    Was seen 04/17/2016 for this problem. CXR Was negative. Pt was started on Hycodan. Pt was seen again on 04/22/2016 by Dr. Rosanna Randy for this. Pt was started on Advair and was referred to pulmonology.  Has appointment with Dr. Alva Garnet on 05/14/2016. Pt reports he finished his Advair, and it did NOT improve sx. Pt c/o sinus drainage and cough x 4 weeks. Has also tried OTC Zicam, with some relief.    Reports hx of reflux but not on medication at this time. States cough does not bother him as much at night. Denies waking with acid taste or heartburn.   Review of Systems     Objective:   Physical Exam  Constitutional: He appears well-developed and well-nourished. No distress.  Pulmonary/Chest: Breath sounds normal.       Assessment:    1. Persistent cough: ? Mediated by nocturnal reflux  2. History of gastroesophageal reflux (GERD)    Plan:    Refill rx for Zantac at take 300 mg.at bedtime pending pulmonary appointment.

## 2016-05-14 ENCOUNTER — Ambulatory Visit (INDEPENDENT_AMBULATORY_CARE_PROVIDER_SITE_OTHER): Payer: BLUE CROSS/BLUE SHIELD | Admitting: Pulmonary Disease

## 2016-05-14 ENCOUNTER — Encounter: Payer: Self-pay | Admitting: Pulmonary Disease

## 2016-05-14 VITALS — BP 122/78 | HR 94 | Ht 67.0 in | Wt 160.4 lb

## 2016-05-14 DIAGNOSIS — R05 Cough: Secondary | ICD-10-CM

## 2016-05-14 DIAGNOSIS — J329 Chronic sinusitis, unspecified: Secondary | ICD-10-CM

## 2016-05-14 DIAGNOSIS — R053 Chronic cough: Secondary | ICD-10-CM

## 2016-05-14 MED ORDER — DOXYCYCLINE HYCLATE 100 MG PO TABS: 100.0000 mg | ORAL_TABLET | Freq: Two times a day (BID) | ORAL | 0 refills | Status: DC

## 2016-05-14 MED ORDER — FLUTICASONE-SALMETEROL 250-50 MCG/DOSE IN AEPB: 1.0000 | INHALATION_SPRAY | Freq: Two times a day (BID) | RESPIRATORY_TRACT | 10 refills | Status: DC

## 2016-05-14 MED ORDER — FLUTICASONE PROPIONATE 50 MCG/ACT NA SUSP: 2.0000 | Freq: Every day | NASAL | 10 refills | Status: DC

## 2016-05-14 MED ORDER — FLUTICASONE PROPIONATE 50 MCG/ACT NA SUSP
2.0000 | Freq: Every day | NASAL | 10 refills | Status: DC
Start: 2016-05-14 — End: 2016-12-31

## 2016-05-14 NOTE — Patient Instructions (Signed)
1) Continue all of your current medications including ranitidine (Zantac), decongestants 2) Add Flonase nasal inhaler - 2 sprays per nostril daily 3) Change Advair strength to 250 strength - prescription has been changed 4) Doxycycline 100 mg twice a day for 7 days

## 2016-05-15 NOTE — Progress Notes (Signed)
PULMONARY CONSULT NOTE  Requesting MD/Service: Miguel Aschoff, MD Date of initial consultation: 05/14/16 Reason for consultation: Subacute cough  PT PROFILE: 55 y.o. M never smoker referred for evaluation of cough of > 1 month's duration  DATA: CT sinuses 04/16/16: Mucosal thickening along the floor and anterior wall of the left maxillary sinus, probably secondary to dental root incursion. No layering sinus fluid. The remainder the sinuses are clear  HPI:  53 M never smoker with history recurrent bouts of acute sinusitis is referred for evaluation of of cough, sinus fullness and posterior nasal drainage for greater than one month. He reports that this is the "worst episode ever". He has been seen by Dr Tami Ribas (ENT) and was treated with antibiotics and prednisone for 2 weeks with minimal improvement. He has subsequently been started on ranitidine and is taking over-the-counter decongestants. He has recently been started on Advair 100/50 (he has only been given a sample). He is unable to identify any exacerbating or alleviating factors. There is little variation throughout the day or from day to day. He denies CP, fever, purulent sputum, hemoptysis, LE edema and calf tenderness.   Past Medical History:  Diagnosis Date  . Anginal pain ([place])   . Bipolar disorder ([place])   . Depression   . Hypertension   . Hypothyroidism   . Obsessive compulsive disorder   . Sleep apnea    patient states it was "marginal", no CPAP    Past Surgical History:  Procedure Laterality Date  . COLONOSCOPY WITH PROPOFOL N/A 02/05/2016   Procedure: COLONOSCOPY WITH PROPOFOL;  Surgeon: [name], MD;  Location: [place];  Service: Endoscopy;  Laterality: N/A;  . MOUTH SURGERY     orthodontic surgery for underbite  . MYRINGOTOMY WITH TUBE PLACEMENT    . NASAL SEPTUM SURGERY    . POLYPECTOMY N/A 02/05/2016   Procedure: POLYPECTOMY;  Surgeon: [name], MD;  Location: [place];  Service:  Endoscopy;  Laterality: N/A;    MEDICATIONS: I have reviewed all medications and confirmed regimen as documented  Social History   Social History  . Marital status: Single    Spouse name: N/A  . Number of children: N/A  . Years of education: N/A   Occupational History  . Not on file.   Social History Main Topics  . Smoking status: Never Smoker  . Smokeless tobacco: Never Used  . Alcohol use Yes     Comment: rarely  . Drug use: No  . Sexual activity: No   Other Topics Concern  . Not on file   Social History Narrative  . No narrative on file    Family History  Problem Relation Age of Onset  . Depression Mother   . Anxiety disorder Mother   . Congestive Heart Failure Father   . Prostate cancer Father   . Hypertension Father     ROS: No fever, myalgias/arthralgias, unexplained weight loss or weight gain No new focal weakness or sensory deficits No otalgia, hearing loss, visual changes, nasal and sinus symptoms, mouth and throat problems No neck pain or adenopathy No abdominal pain, N/V/D, diarrhea, change in bowel pattern No dysuria, change in urinary pattern   Vitals:   05/14/16 1406  BP: 122/78  Pulse: 94  SpO2: 97%  Weight: 160 lb 6.4 oz (72.8 kg)  Height: 5\' 7"  (1.702 m)    EXAM:  Gen: WDWN, No overt respiratory distress, intermittent coughing during encounter HEENT: NCAT, sclera white, oropharynx normal, nasal mucosa boggy and  eryhtematous Neck: Supple without LAN, thyromegaly, JVD Lungs: breath sounds: full with diffuse rhonchi, percussion: normal, no wheezes Cardiovascular: RRR, no murmurs noted Abdomen: Soft, nontender, normal BS Ext: without clubbing, cyanosis, edema Neuro: CNs grossly intact, motor and sensory intact Skin: Limited exam, no lesions noted  DATA:   BMP Latest Ref Rng & Units 02/27/2016 12/25/2015 11/09/2015  Glucose 65 - 99 mg/dL 85 90 84  BUN 6 - 24 mg/dL 15 16 19   Creatinine 0.76 - 1.27 mg/dL 0.97 0.97 1.08  BUN/Creat Ratio  9 - 20 15 16  -  Sodium 134 - 144 mmol/L 139 140 139  Potassium 3.5 - 5.2 mmol/L 3.4(L) 4.2 3.9  Chloride 96 - 106 mmol/L 94(L) 99 99(L)  CO2 18 - 29 mmol/L 28 26 32  Calcium 8.7 - 10.2 mg/dL 10.4(H) 9.4 9.3    CBC Latest Ref Rng & Units 12/25/2015 11/09/2015 01/27/2010  WBC 3.4 - 10.8 x10E3/uL 6.4 8.7 6.3  Hemoglobin 13.0 - 18.0 g/dL - 17.4 17.3  Hematocrit 37.5 - 51.0 % 42.9 50.5 49  Platelets 150 - 379 x10E3/uL 306 298 274    CXR (04/17/16): [facility]    IMPRESSION:     ICD-9-CM ICD-10-CM   1. Chronic cough 786.2 R05   2. Chronic sinusitis, unspecified location 473.9 J32.9   1) History of recurrent sinusitis 2) Subacute cough - etiology unclear but seems to be a component of severe rhinitis with PNDS and also exam findings suggesting lower respiratory tract component (I> acute bronchitis)  PLAN:  1) Continue Ranitidine and decongestants 2) Add Flonase nasal inhaler - 2 sprays per nostril daily 3) Change Advair to 250/50 strength 4) Doxycycline 100 mg BID X 7 days 5) ROV 4-6 weeks  Merton Border, MD PCCM service Mobile [phone] Pager [phone] 05/15/2016

## 2016-05-21 ENCOUNTER — Telehealth: Payer: Self-pay | Admitting: Pulmonary Disease

## 2016-05-21 NOTE — Telephone Encounter (Signed)
Pt states he is no better with his antibiotic and inhaler. Please call.

## 2016-05-22 ENCOUNTER — Other Ambulatory Visit: Payer: Self-pay | Admitting: Family Medicine

## 2016-05-22 NOTE — Telephone Encounter (Signed)
LMTCB- will hold in triage to f/u on

## 2016-05-22 NOTE — Telephone Encounter (Signed)
Pt returning call.William Coleman ° °

## 2016-05-22 NOTE — Telephone Encounter (Signed)
Called and lmomtcb x 1.  Need to offer appt with Dr. Ashby Dawes for tomorrow for the pt.

## 2016-05-22 NOTE — Telephone Encounter (Signed)
Patient needs to be seen by provider for assessment.

## 2016-05-22 NOTE — Telephone Encounter (Signed)
Called and spoke with the pt and he stated that he was seen by DS on 12/5 and he was given the doxy and advair.  He stated that the prednisone did not help either.  Pt stated that he still feels terrible and has a bad cough.  He is aware that DS is not in the office today, but will forward to [place] to follow up.  Please advise. Thanks  Allergies  Allergen Reactions  . Amoxicillin Hives  . Penicillins   . Sulfa Antibiotics

## 2016-05-22 NOTE — Telephone Encounter (Signed)
Please review. KW 

## 2016-05-22 NOTE — Telephone Encounter (Signed)
Pt called needing a refill on             ranitidine (ZANTAC) 150 MG capsule        Pt is taking 300 mg now.  He will needs a new prescription called in  [place] is where is gets his pharmacy.  [place] [phone]  Thanks Con Memos

## 2016-05-23 ENCOUNTER — Ambulatory Visit (INDEPENDENT_AMBULATORY_CARE_PROVIDER_SITE_OTHER): Payer: BLUE CROSS/BLUE SHIELD | Admitting: Internal Medicine

## 2016-05-23 ENCOUNTER — Encounter: Payer: Self-pay | Admitting: Internal Medicine

## 2016-05-23 VITALS — BP 134/76 | HR 75 | Ht 67.0 in | Wt 160.0 lb

## 2016-05-23 DIAGNOSIS — J301 Allergic rhinitis due to pollen: Secondary | ICD-10-CM | POA: Diagnosis not present

## 2016-05-23 DIAGNOSIS — R05 Cough: Secondary | ICD-10-CM

## 2016-05-23 DIAGNOSIS — J329 Chronic sinusitis, unspecified: Secondary | ICD-10-CM | POA: Diagnosis not present

## 2016-05-23 DIAGNOSIS — R053 Chronic cough: Secondary | ICD-10-CM

## 2016-05-23 MED ORDER — PREDNISONE 10 MG PO TABS: ORAL_TABLET | ORAL | 0 refills | Status: DC

## 2016-05-23 MED ORDER — BENZONATATE 200 MG PO CAPS: 200.0000 mg | ORAL_CAPSULE | Freq: Three times a day (TID) | ORAL | 0 refills | Status: DC

## 2016-05-23 MED ORDER — BENZONATATE 200 MG PO CAPS: 200.0000 mg | ORAL_CAPSULE | Freq: Three times a day (TID) | ORAL | 1 refills | Status: DC

## 2016-05-23 MED ORDER — RANITIDINE HCL 300 MG PO CAPS: 300.0000 mg | ORAL_CAPSULE | Freq: Every evening | ORAL | 3 refills | Status: DC

## 2016-05-23 NOTE — Addendum Note (Signed)
Addended by: Maryanna Shape A on: 05/23/2016 02:28 PM   Modules accepted: Orders

## 2016-05-23 NOTE — Addendum Note (Signed)
Addended by: Maryanna Shape A on: 05/23/2016 02:56 PM   Modules accepted: Orders

## 2016-05-23 NOTE — Telephone Encounter (Signed)
Number of refills? KW

## 2016-05-23 NOTE — Progress Notes (Signed)
* [name]     Assessment and Plan:  Acute bronchitis.  -Cough with acute bronchitis. No significant dyspnea today, will treat empirically with steroids. - Discussed that this will likely need to be treated symptomatically, and monitor his course. If symptoms persist for next some time, we'll need to consider sputum culture.  Cough.  -Likely secondary to acute sinusitis, will start prednisone taper, Tessalon. -Started received 2 courses of antibiotics.  Acute rhinitis -Afrin nasal spray, use as directed for up to 2 weeks, twice daily.  Date: 05/23/2016  MRN# [id number] William Coleman 11/09/1960   William Coleman. is a 55 y.o. old male seen in follow up for chief complaint of  Chief Complaint  Patient presents with  . Follow-up    5wk rov. pt state sbreathing is has slightly worsen since last OV with DS. pt c/o dry cough cough at times prod with clear mucus & occ chest tightness with coughing spell     HPI:   Patient is a 55 year old male. He has been having Crohn's with significant sinusitis and bronchitis, as well as cough over the last 1-1.5 months. He saw his memory care physician, that time he was prescribed antibiotics, he was referred due to ENT, CT of the sinuses was done which found no acute findings. Subsequent, he was referred  to this clinic, he saw Dr. Alva Garnet, he was given prescription for Advair, and Flonase. He was asked to follow up in approximately 1 month's time, he comes back in early because of persistent symptoms without improvement. He denies any significant dyspnea at this time. His main complaint is chronic runny nose, sinus drainage, and cough.  Medication:   Outpatient Encounter Prescriptions as of 05/23/2016  Medication Sig  . acetaminophen (TYLENOL) 500 MG tablet Take 500 mg by mouth every 6 (six) hours as needed.  Marland Kitchen aspirin 81 MG tablet Take 81 mg by mouth daily.  Marland Kitchen FLUARIX QUADRIVALENT 0.5 ML injection inject 0.5  milliliter intramuscularly  . fluticasone (FLONASE) 50 MCG/ACT nasal spray Place 2 sprays into both nostrils daily.  . Fluticasone-Salmeterol (ADVAIR DISKUS) 250-50 MCG/DOSE AEPB Inhale 1 puff into the lungs 2 (two) times daily.  . hydrochlorothiazide (HYDRODIURIL) 25 MG tablet Take 1 tablet (25 mg total) by mouth daily.  Marland Kitchen HYDROcodone-homatropine (HYCODAN) 5-1.5 MG/5ML syrup 5 ml 4-6 hours as needed for cough  . levothyroxine (SYNTHROID, LEVOTHROID) 25 MCG tablet Take 1 tablet (25 mcg total) by mouth daily.  . Multiple Vitamin (MULTIVITAMIN) tablet Take 1 tablet by mouth daily.  . ranitidine (ZANTAC) 300 MG capsule Take 1 capsule (300 mg total) by mouth every evening.  . rosuvastatin (CRESTOR) 10 MG tablet Take 1 tablet (10 mg total) by mouth daily.  . vitamin C (ASCORBIC ACID) 500 MG tablet Take 500 mg by mouth daily.  . [DISCONTINUED] doxycycline (VIBRA-TABS) 100 MG tablet Take 1 tablet (100 mg total) by mouth 2 (two) times daily.   No facility-administered encounter medications on file as of 05/23/2016.      Allergies:  Amoxicillin; Penicillins; and Sulfa antibiotics  Review of Systems: Gen:  Denies  fever, sweats. HEENT: Denies blurred vision. Cvc:  No dizziness, chest pain or heaviness Resp:   Denies cough or sputum porduction. Gi: Denies swallowing difficulty, stomach pain. constipation, bowel incontinence Gu:  Denies bladder incontinence, burning urine Ext:   No Joint pain, stiffness. Skin: No skin rash, easy bruising. Endoc:  No polyuria, polydipsia. Psych: No depression, insomnia. Other:  All other  systems were reviewed and found to be negative other than what is mentioned in the HPI.   Physical Examination:   VS: BP 134/76 (BP Location: Left Arm, Cuff Size: Normal)   Pulse 75   Ht 5\' 7"  (1.702 m)   Wt 72.6 kg (160 lb)   SpO2 97%   BMI 25.06 kg/m   General Appearance: No distress  Neuro:without focal findings,  speech normal,  HEENT: PERRLA, EOM intact. Pulmonary:  normal breath sounds, No wheezing.   CardiovascularNormal S1,S2.  No m/r/g.   Abdomen: Benign, Soft, non-tender. Renal:  No costovertebral tenderness  GU:  Not performed at this time. Endoc: No evident thyromegaly, no signs of acromegaly. Skin:   warm, no rash. Extremities: normal, no cyanosis, clubbing.   LABORATORY PANEL:   CBC No results for input(s): WBC, HGB, HCT, PLT in the last 168 hours. ------------------------------------------------------------------------------------------------------------------  Chemistries  No results for input(s): NA, K, CL, CO2, GLUCOSE, BUN, CREATININE, CALCIUM, MG, AST, ALT, ALKPHOS, BILITOT in the last 168 hours.  Invalid input(s): GFRCGP ------------------------------------------------------------------------------------------------------------------  Cardiac Enzymes No results for input(s): TROPONINI in the last 168 hours. ------------------------------------------------------------  RADIOLOGY:   No results found for this or any previous visit. Results for orders placed during the hospital encounter of 04/17/16  DG Chest 2 View   Narrative CLINICAL DATA:  Cough, congestion, short of breath  EXAM: CHEST  2 VIEW  COMPARISON:  None.  FINDINGS: Normal heart size. Lungs clear. No pneumothorax. No pleural effusion.  IMPRESSION: No active cardiopulmonary disease.   Electronically Signed   By: Marybelle Killings M.D.   On: 04/17/2016 11:24    ------------------------------------------------------------------------------------------------------------------  Thank  you for allowing [facility] to assist in the care of your patient. Our recommendations are noted above.  Please contact us if we can be of further service.   Marda Stalker, MD.   Pulmonary and Critical Care Office Number: [phone]  Patricia Pesa, M.D.  Vilinda Boehringer, M.D.  Merton Border, M.D  05/23/2016

## 2016-05-23 NOTE — Progress Notes (Signed)
* [facility]     Assessment and Plan:  Cough.  --Start prednisone, will try a longer taper.  --Will prescribe tessalon.   Vasomotor rhinitis.  --Stop flonase start afrin nasal spray for 2-4 weeks for sinus drainage.   Acute bronchitis.  --Mild, doubt that this requires treatment. He does not appears to have any dyspnea at this time.    Date: 05/23/2016  MRN# [id number] William Coleman 10-27-1960   William Coleman. is a 55 y.o. old male seen in follow up for chief complaint of  Chief Complaint  Patient presents with  . Follow-up    5wk rov. pt state sbreathing is has slightly worsen since last OV with DS. pt c/o dry cough cough at times prod with clear mucus & occ chest tightness with coughing spell     HPI:   He recently saw Dr. Alva Garnet was noted to have sinus infection and bronchitis. He has seen ENT and was diagnosed with sinusitis and was treated with abx.  He saw Dr. Alva Garnet about 10 days ago, he was given a course of abx, and increase the dose of advair.  He came back early as he notes that the symptoms are not any better, he continue to have cough, and sinus drainage. He has been tried on prednisone, but does not feel that it helped. He notes that he get sinus infections a few times per year.  He has no pets at home. He is not a smoker. 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 He takes no nasal sprays he notes that his nose runs so much that the nasal spray does not work.  He does not really have dyspnea. He takes flonase daily.  He takes sudafed but does not help. He has not tried allegra-D.   Medication:   Outpatient Encounter Prescriptions as of 05/23/2016  Medication Sig  . acetaminophen (TYLENOL) 500 MG tablet Take 500 mg by mouth every 6 (six) hours as needed.  Marland Kitchen aspirin 81 MG tablet Take 81 mg by mouth daily.  Marland Kitchen FLUARIX QUADRIVALENT 0.5 ML injection inject 0.5 milliliter intramuscularly  . fluticasone (FLONASE) 50 MCG/ACT nasal  spray Place 2 sprays into both nostrils daily.  . Fluticasone-Salmeterol (ADVAIR DISKUS) 250-50 MCG/DOSE AEPB Inhale 1 puff into the lungs 2 (two) times daily.  . hydrochlorothiazide (HYDRODIURIL) 25 MG tablet Take 1 tablet (25 mg total) by mouth daily.  Marland Kitchen HYDROcodone-homatropine (HYCODAN) 5-1.5 MG/5ML syrup 5 ml 4-6 hours as needed for cough  . levothyroxine (SYNTHROID, LEVOTHROID) 25 MCG tablet Take 1 tablet (25 mcg total) by mouth daily.  . Multiple Vitamin (MULTIVITAMIN) tablet Take 1 tablet by mouth daily.  . ranitidine (ZANTAC) 300 MG capsule Take 1 capsule (300 mg total) by mouth every evening.  . rosuvastatin (CRESTOR) 10 MG tablet Take 1 tablet (10 mg total) by mouth daily.  . vitamin C (ASCORBIC ACID) 500 MG tablet Take 500 mg by mouth daily.  . [DISCONTINUED] doxycycline (VIBRA-TABS) 100 MG tablet Take 1 tablet (100 mg total) by mouth 2 (two) times daily.   No facility-administered encounter medications on file as of 05/23/2016.      Allergies:  Amoxicillin; Penicillins; and Sulfa antibiotics  Review of Systems: Gen:  Denies  fever, sweats. HEENT: Denies blurred vision. Cvc:  No dizziness, chest pain or heaviness Resp:   Denies cough or sputum porduction. Gi: Denies swallowing difficulty, stomach pain. constipation, bowel incontinence Gu:  Denies bladder incontinence, burning urine Ext:   No  Joint pain, stiffness. Skin: No skin rash, easy bruising. Endoc:  No polyuria, polydipsia. Psych: No depression, insomnia. Other:  All other systems were reviewed and found to be negative other than what is mentioned in the HPI.   Physical Examination:   VS: BP 134/76 (BP Location: Left Arm, Cuff Size: Normal)   Pulse 75   Ht 5\' 7"  (1.702 m)   Wt 72.6 kg (160 lb)   SpO2 97%   BMI 25.06 kg/m   General Appearance: No distress  Neuro:without focal findings,  speech normal,  HEENT: PERRLA, EOM intact. Pulmonary: normal breath sounds, No wheezing.   CardiovascularNormal S1,S2.  No  m/r/g.   Abdomen: Benign, Soft, non-tender. Renal:  No costovertebral tenderness  GU:  Not performed at this time. Endoc: No evident thyromegaly, no signs of acromegaly. Skin:   warm, no rash. Extremities: normal, no cyanosis, clubbing.   LABORATORY PANEL:   CBC No results for input(s): WBC, HGB, HCT, PLT in the last 168 hours. ------------------------------------------------------------------------------------------------------------------  Chemistries  No results for input(s): NA, K, CL, CO2, GLUCOSE, BUN, CREATININE, CALCIUM, MG, AST, ALT, ALKPHOS, BILITOT in the last 168 hours.  Invalid input(s): GFRCGP ------------------------------------------------------------------------------------------------------------------  Cardiac Enzymes No results for input(s): TROPONINI in the last 168 hours. ------------------------------------------------------------  RADIOLOGY:   No results found for this or any previous visit. Results for orders placed during the hospital encounter of 04/17/16  DG Chest 2 View   Narrative CLINICAL DATA:  Cough, congestion, short of breath  EXAM: CHEST  2 VIEW  COMPARISON:  None.  FINDINGS: Normal heart size. Lungs clear. No pneumothorax. No pleural effusion.  IMPRESSION: No active cardiopulmonary disease.   Electronically Signed   By: Marybelle Killings M.D.   On: 04/17/2016 11:24    ------------------------------------------------------------------------------------------------------------------  Thank  you for allowing [facility] to assist in the care of your patient. Our recommendations are noted above.  Please contact us if we can be of further service.   Marda Stalker, MD.  [facility] Office Number: [phone]  Patricia Pesa, M.D.  Vilinda Boehringer, M.D.  Merton Border, M.D  05/23/2016

## 2016-05-23 NOTE — Telephone Encounter (Signed)
1year

## 2016-05-23 NOTE — Telephone Encounter (Signed)
ok 

## 2016-05-23 NOTE — Patient Instructions (Addendum)
--  Prednisone 10 mg tabs x  42.  Take 6 tablets on day 1,2 Take 5 tablets on day 3,4 Take 4 tablets on day 5,6 Take 3 tablets on day 7,8 Take 2 tablets on day 9,10 Take 1 tablet on day 11,12 Then stop.   --Afrin nasal spray twice daily, use for no more than 2 weeks.   --Tessalon 200 mg three times daily for one month.

## 2016-05-23 NOTE — Telephone Encounter (Signed)
Done-aa 

## 2016-06-06 DIAGNOSIS — F429 Obsessive-compulsive disorder, unspecified: Secondary | ICD-10-CM | POA: Diagnosis not present

## 2016-06-07 ENCOUNTER — Telehealth: Payer: Self-pay | Admitting: Pulmonary Disease

## 2016-06-07 NOTE — Telephone Encounter (Signed)
Pt calling stating he needs [place] to call him back He states he needs to see [place] urgently for he is not doing any better than the last time he was here.  Cough is still bad, sinus drainage is still there  Still taking some of the medications we gave him but it does seem to be working Needs to talk about this  Please advise .

## 2016-06-07 NOTE — Telephone Encounter (Signed)
Spoke with pt, requesting asap appt- states he has felt ill X3 months with no improvement on prednisone and abx.  Pt scheduled with Dr. Mortimer Fries on Thursday at 11:00.  Nothing further needed.

## 2016-06-12 DIAGNOSIS — F331 Major depressive disorder, recurrent, moderate: Secondary | ICD-10-CM | POA: Diagnosis not present

## 2016-06-13 ENCOUNTER — Ambulatory Visit (INDEPENDENT_AMBULATORY_CARE_PROVIDER_SITE_OTHER): Payer: BLUE CROSS/BLUE SHIELD | Admitting: Internal Medicine

## 2016-06-13 ENCOUNTER — Encounter: Payer: Self-pay | Admitting: Internal Medicine

## 2016-06-13 VITALS — BP 124/76 | HR 82 | Ht 67.0 in | Wt 161.2 lb

## 2016-06-13 DIAGNOSIS — J309 Allergic rhinitis, unspecified: Secondary | ICD-10-CM

## 2016-06-13 MED ORDER — IPRATROPIUM BROMIDE 0.06 % NA SOLN: 2.0000 | Freq: Three times a day (TID) | NASAL | 2 refills | Status: DC

## 2016-06-13 MED ORDER — PANTOPRAZOLE SODIUM 40 MG PO TBEC: 40.0000 mg | DELAYED_RELEASE_TABLET | Freq: Every day | ORAL | 3 refills | Status: DC

## 2016-06-13 MED ORDER — CETIRIZINE HCL 10 MG PO TABS: 10.0000 mg | ORAL_TABLET | Freq: Every day | ORAL | Status: DC

## 2016-06-13 NOTE — Patient Instructions (Signed)
Use flonase in morning Use Netti Pot as night Start using ATrovent Nasal sprays at night Start protonix Start zyrtec daily

## 2016-06-13 NOTE — Progress Notes (Signed)
PULMONARY CONSULT NOTE  Requesting MD/Service: Miguel Aschoff, MD Date of initial consultation: 05/14/16 Reason for consultation: Subacute cough  PT PROFILE: 56 y.o. M never smoker referred for evaluation of cough of > 3 month's duration  DATA: CT sinuses 04/16/16: Mucosal thickening along the floor and anterior wall of the left maxillary sinus, probably secondary to dental root incursion. No layering sinus fluid. The remainder the sinuses are clear    HPI:  49 M never smoker with history recurrent bouts of acute sinusitis is referred for evaluation of of cough, sinus fullness and posterior nasal drainage for greater than 3 months.  -He reports that this is the "worst episode ever". He has been seen by Dr Tami Ribas (ENT) and was treated with antibiotics and prednisone for 2 weeks with minimal improvement. He has subsequently been started on ranitidine and is taking over-the-counter decongestants(pseudophed)  He has recently been started on Advair 200/50 (he has only been given a sample) - He is unable to identify any exacerbating or alleviating factors. There is little variation throughout the day or from day to day. He denies CP, fever, purulent sputum, hemoptysis, LE edema and calf tenderness.  He has tried flonase,advair 200, several rounds of ABX and steroids and still has persistent nasal drainage with throat clearing and cough Patient is miserable and has persistent symtpoms   Past Surgical History:  Procedure Laterality Date  . COLONOSCOPY WITH PROPOFOL N/A 02/05/2016   Procedure: COLONOSCOPY WITH PROPOFOL;  Surgeon: [name], MD;  Location: [facility];  Service: Endoscopy;  Laterality: N/A;  . MOUTH SURGERY     orthodontic surgery for underbite  . MYRINGOTOMY WITH TUBE PLACEMENT    . NASAL SEPTUM SURGERY    . POLYPECTOMY N/A 02/05/2016   Procedure: POLYPECTOMY;  Surgeon: [name], MD;  Location: [facility];  Service: Endoscopy;  Laterality: N/A;     MEDICATIONS: I have reviewed all medications and confirmed regimen as documented   ROS: No fever, myalgias/arthralgias, unexplained weight loss or weight gain No new focal weakness or sensory deficits No otalgia, hearing loss, visual changes, nasal and sinus symptoms, mouth and throat problems No neck pain or adenopathy No abdominal pain, N/V/D, diarrhea, change in bowel pattern No dysuria, change in urinary pattern +nasal drainage +cough  Vitals:   06/13/16 1110  BP: 124/76  Pulse: 82  SpO2: 98%  Weight: 161 lb 3.2 oz (73.1 kg)  Height: 5\' 7"  (1.702 m)    EXAM:  Gen: WDWN, No overt respiratory distress, intermittent coughing during encounter HEENT: NCAT, sclera white, oropharynx normal, nasal mucosa boggy and eryhtematous Neck: Supple without LAN, thyromegaly, JVD Lungs: breath sounds: full with diffuse rhonchi, percussion: normal, no wheezes Cardiovascular: RRR, no murmurs noted Abdomen: Soft, nontender, normal BS Ext: without clubbing, cyanosis, edema Neuro: CNs grossly intact, motor and sensory intact Skin: Limited exam, no lesions noted  DATA:   BMP Latest Ref Rng & Units 02/27/2016 12/25/2015 11/09/2015  Glucose 65 - 99 mg/dL 85 90 84  BUN 6 - 24 mg/dL 15 16 19   Creatinine 0.76 - 1.27 mg/dL 0.97 0.97 1.08  BUN/Creat Ratio 9 - 20 15 16  -  Sodium 134 - 144 mmol/L 139 140 139  Potassium 3.5 - 5.2 mmol/L 3.4(L) 4.2 3.9  Chloride 96 - 106 mmol/L 94(L) 99 99(L)  CO2 18 - 29 mmol/L 28 26 32  Calcium 8.7 - 10.2 mg/dL 10.4(H) 9.4 9.3    CBC Latest Ref Rng & Units 12/25/2015 11/09/2015 01/27/2010  WBC 3.4 -  10.8 x10E3/uL 6.4 8.7 6.3  Hemoglobin 13.0 - 18.0 g/dL - 17.4 17.3  Hematocrit 37.5 - 51.0 % 42.9 50.5 49  Platelets 150 - 379 x10E3/uL 306 298 274    CXR (04/17/16): no acute issues  IMPRESSION:    56 yo white male with chronic cough and chronic nasal drainage with h/o of sinus infections  Most likely etiology is severe chronic allergic rhinitis with  intermittent reactive airways disease.   1.start zyrtec 10 mg at night 2.start Atrovent nasal sprays at night, continue flonase sprays 3.start NETI POT nasal rinsing daily prior to using nasal sprays 4.start protonix daily, stop zantac 5.stop advair  Follow up in 4 weeks  I have personally obtained a history, examined the patient, evaluated Pertinent laboratory and RadioGraphic/imaging results, and  formulated the assessment and plan  The Patient requires high complexity decision making for assessment and support, frequent evaluation and titration of therapies.  Patient satisfied with Plan of action and management. All questions answered  Corrin Parker, M.D.  [facility]  Medical Director [place] Director [facility] Cardio-Pulmonary Department

## 2016-06-14 ENCOUNTER — Ambulatory Visit: Payer: Self-pay | Admitting: Pulmonary Disease

## 2016-06-14 ENCOUNTER — Telehealth: Payer: Self-pay | Admitting: Internal Medicine

## 2016-06-14 ENCOUNTER — Encounter: Payer: Self-pay | Admitting: *Deleted

## 2016-06-14 NOTE — Telephone Encounter (Signed)
Will forward message to [facility] Triage to follow up on.

## 2016-06-14 NOTE — Telephone Encounter (Signed)
Observed patient receiving letter from [facility] (triage nurse) with directions to follow Dr. Zoila Shutter instruction and that the note covers work absence for Thursday 1/4//18 and Friday 06/14/16. After Misty returned to the clinic it was quite obivious that the patient was not happy. He proceeded to ball up the letter and throw it away in the waiting room trash can. I called and informed Misty of his behavior.

## 2016-06-14 NOTE — Telephone Encounter (Signed)
Patient needs a doctor note to be out of work from today until????

## 2016-06-18 DIAGNOSIS — F331 Major depressive disorder, recurrent, moderate: Secondary | ICD-10-CM | POA: Diagnosis not present

## 2016-06-24 ENCOUNTER — Telehealth: Payer: Self-pay | Admitting: Internal Medicine

## 2016-06-24 ENCOUNTER — Ambulatory Visit (INDEPENDENT_AMBULATORY_CARE_PROVIDER_SITE_OTHER)
Admission: RE | Admit: 2016-06-24 | Discharge: 2016-06-24 | Disposition: A | Discharge status: Home / Self Care | Payer: BLUE CROSS/BLUE SHIELD | Source: Ambulatory Visit | Attending: Adult Health | Admitting: Adult Health

## 2016-06-24 ENCOUNTER — Other Ambulatory Visit (INDEPENDENT_AMBULATORY_CARE_PROVIDER_SITE_OTHER): Payer: BLUE CROSS/BLUE SHIELD

## 2016-06-24 ENCOUNTER — Ambulatory Visit (INDEPENDENT_AMBULATORY_CARE_PROVIDER_SITE_OTHER): Payer: BLUE CROSS/BLUE SHIELD | Admitting: Adult Health

## 2016-06-24 ENCOUNTER — Encounter: Payer: Self-pay | Admitting: Adult Health

## 2016-06-24 VITALS — BP 140/90 | HR 91 | Temp 97.7°F | Ht 67.0 in | Wt 161.6 lb

## 2016-06-24 DIAGNOSIS — R059 Cough, unspecified: Secondary | ICD-10-CM

## 2016-06-24 DIAGNOSIS — R05 Cough: Secondary | ICD-10-CM | POA: Diagnosis not present

## 2016-06-24 DIAGNOSIS — J301 Allergic rhinitis due to pollen: Secondary | ICD-10-CM

## 2016-06-24 DIAGNOSIS — M25472 Effusion, left ankle: Secondary | ICD-10-CM | POA: Diagnosis not present

## 2016-06-24 LAB — D-DIMER, QUANTITATIVE (NOT AT ARMC): D DIMER QUANT: 0.38 ug{FEU}/mL (ref <0.50)

## 2016-06-24 LAB — CBC WITH DIFFERENTIAL/PLATELET
Basophils Absolute: 0.1 10*3/uL (ref 0.0–0.1)
Basophils Relative: 0.9 % (ref 0.0–3.0)
EOS ABS: 0.2 10*3/uL (ref 0.0–0.7)
Eosinophils Relative: 4.2 % (ref 0.0–5.0)
HEMATOCRIT: 44.7 % (ref 39.0–52.0)
Hemoglobin: 15.4 g/dL (ref 13.0–17.0)
LYMPHS ABS: 1.7 10*3/uL (ref 0.7–4.0)
Lymphocytes Relative: 29.5 % (ref 12.0–46.0)
MCHC: 34.5 g/dL (ref 30.0–36.0)
MCV: 86.6 fl (ref 78.0–100.0)
MONO ABS: 0.7 10*3/uL (ref 0.1–1.0)
Monocytes Relative: 12.3 % — ABNORMAL HIGH (ref 3.0–12.0)
NEUTROS ABS: 3.1 10*3/uL (ref 1.4–7.7)
NEUTROS PCT: 53.1 % (ref 43.0–77.0)
PLATELETS: 366 10*3/uL (ref 150.0–400.0)
RBC: 5.16 Mil/uL (ref 4.22–5.81)
RDW: 14.7 % (ref 11.5–15.5)
WBC: 5.8 10*3/uL (ref 4.0–10.5)

## 2016-06-24 LAB — BASIC METABOLIC PANEL
BUN: 15 mg/dL (ref 6–23)
CO2: 35 mEq/L — ABNORMAL HIGH (ref 19–32)
Calcium: 9.6 mg/dL (ref 8.4–10.5)
Chloride: 95 mEq/L — ABNORMAL LOW (ref 96–112)
Creatinine, Ser: 1.03 mg/dL (ref 0.40–1.50)
GFR: 79.85 mL/min (ref >60.00)
Glucose, Bld: 68 mg/dL — ABNORMAL LOW (ref 70–99)
POTASSIUM: 3.2 meq/L — AB (ref 3.5–5.1)
SODIUM: 137 meq/L (ref 135–145)

## 2016-06-24 LAB — NITRIC OXIDE: Nitric Oxide: 16

## 2016-06-24 MED ORDER — BENZONATATE 200 MG PO CAPS: 200.0000 mg | ORAL_CAPSULE | Freq: Three times a day (TID) | ORAL | 1 refills | Status: DC | PRN

## 2016-06-24 MED ORDER — PREDNISONE 10 MG PO TABS: ORAL_TABLET | ORAL | 0 refills | Status: DC

## 2016-06-24 NOTE — Progress Notes (Signed)
@Patient  ID: William Jumbo., male    DOB: 1960-10-06, 56 y.o.   MRN: [id number]  Chief Complaint  Patient presents with  . Acute Visit    Cough     Referring provider: Jerrol Banana.,*  HPI: 56 yo male never smoker seen for initial pulmonary consult 05/14/16 for cough for 6 weeks.   06/24/2016 Acute OV : Cough  Pt presents for persistent cough for 2-3 months .  Seen initially for pulmonary consult 05/14/16 that began end of OCT . Was treated for sinus infection and given abx and steroids x 2 prior to pulmonary consult.  He was started on upper airway cough regimen with treatment aimed at AR/GERD preventions.  He had not improvement and has been seen an additional 2 times last month with no benefit.  CT sinus 04/16/16 showed no acute sinus dz. Has seen ENT recently w/ no improvement in sx.  Taking zytec, flonase , atrovent , protonix .   Took tessalon for short while .  Started on Advair iniitally w/ no benefit.  Not using anything for cough . Took hydromet initially , did not want to take b/c drowsiness.  Cough is worse during day. Not as bad at night.  Was on losartan few months ago , changed to hctz.  No previous hx use of ACE inhibitor , MTX, Macrodantin or amiodarone.  No dyspnea , rash , joint swelling or hemoptysis , no unitentianl wt loss.  Usually gets sinus infection once a year that abx take care of easily .  FENO today was 16.  Spirometry was nml but ++ cough .    SH  Never smoker. From this area. Works in Scientist, research (medical) .  No etoh /drugs.  No unusal hobbies, travel or pets .,     Allergies  Allergen Reactions  . Amoxicillin Hives  . Penicillins   . Sulfa Antibiotics     Immunization History  Administered Date(s) Administered  . Influenza Split 02/26/2016  . Tdap 07/24/2007    Past Medical History:  Diagnosis Date  . Anginal pain ([place])   . Bipolar disorder ([place])   . Depression   . Hypertension   . Hypothyroidism   . Obsessive compulsive  disorder   . Sleep apnea    patient states it was "marginal", no CPAP    Tobacco History: History  Smoking Status  . Never Smoker  Smokeless Tobacco  . Never Used   Counseling given: Not Answered   Outpatient Encounter Prescriptions as of 06/24/2016  Medication Sig  . acetaminophen (TYLENOL) 500 MG tablet Take 500 mg by mouth every 6 (six) hours as needed.  Marland Kitchen aspirin 81 MG tablet Take 81 mg by mouth daily.  . cetirizine (ZYRTEC) 10 MG tablet Take 10 mg by mouth daily.  . fluticasone (FLONASE) 50 MCG/ACT nasal spray Place 2 sprays into both nostrils daily.  . hydrochlorothiazide (HYDRODIURIL) 25 MG tablet Take 1 tablet (25 mg total) by mouth daily.  Marland Kitchen ipratropium (ATROVENT) 0.06 % nasal spray Place 2 sprays into the nose 3 (three) times daily.  Marland Kitchen levothyroxine (SYNTHROID, LEVOTHROID) 25 MCG tablet Take 1 tablet (25 mcg total) by mouth daily.  . Multiple Vitamin (MULTIVITAMIN) tablet Take 1 tablet by mouth daily.  . pantoprazole (PROTONIX) 40 MG tablet Take 1 tablet (40 mg total) by mouth daily.  . rosuvastatin (CRESTOR) 10 MG tablet Take 1 tablet (10 mg total) by mouth daily.  . vitamin C (ASCORBIC ACID) 500 MG tablet Take 500  mg by mouth daily.  . benzonatate (TESSALON) 200 MG capsule Take 1 capsule (200 mg total) by mouth 3 (three) times daily. (Patient not taking: Reported on 06/24/2016)  . benzonatate (TESSALON) 200 MG capsule Take 1 capsule (200 mg total) by mouth 3 (three) times daily as needed for cough.  . Fluticasone-Salmeterol (ADVAIR DISKUS) 250-50 MCG/DOSE AEPB Inhale 1 puff into the lungs 2 (two) times daily. (Patient not taking: Reported on 06/24/2016)  . HYDROcodone-homatropine (HYCODAN) 5-1.5 MG/5ML syrup 5 ml 4-6 hours as needed for cough (Patient not taking: Reported on 06/24/2016)  . predniSONE (DELTASONE) 10 MG tablet 4 tabs for 2 days, then 3 tabs for 2 days, 2 tabs for 2 days, then 1 tab for 2 days, then stop  . [DISCONTINUED] FLUARIX QUADRIVALENT 0.5 ML injection  inject 0.5 milliliter intramuscularly  . [DISCONTINUED] ranitidine (ZANTAC) 300 MG capsule Take 1 capsule (300 mg total) by mouth every evening.  . [DISCONTINUED] cetirizine (ZYRTEC) tablet 10 mg    No facility-administered encounter medications on file as of 06/24/2016.      Review of Systems  Constitutional:   No  weight loss, night sweats,  Fevers, chills, fatigue, or  lassitude.  HEENT:   No headaches,  Difficulty swallowing,  Tooth/dental problems, or  Sore throat,                No sneezing, itching, ear ache, + nasal congestion, post nasal drip,   CV:  No chest pain,  Orthopnea, PND, swelling in lower extremities, anasarca, dizziness, palpitations, syncope.   GI  No heartburn, indigestion, abdominal pain, nausea, vomiting, diarrhea, change in bowel habits, loss of appetite, bloody stools.   Resp:   No chest wall deformity  Skin: no rash or lesions.  GU: no dysuria, change in color of urine, no urgency or frequency.  No flank pain, no hematuria   MS:  No joint pain or swelling.  No decreased range of motion.  No back pain.    Physical Exam  BP 140/90   Pulse 91   Temp 97.7 F (36.5 C) (Oral)   Ht 5\' 7"  (1.702 m)   Wt 161 lb 9.6 oz (73.3 kg)   SpO2 95%   BMI 25.31 kg/m   GEN: A/Ox3; pleasant , NAD , barking cough    HEENT:  [place]/AT,  EACs-clear, TMs-wnl, NOSE-clear drainage THROAT-clear, no lesions, no postnasal drip or exudate noted.   NECK:  Supple w/ fair ROM; no JVD; normal carotid impulses w/o bruits; no thyromegaly or nodules palpated; no lymphadenopathy.    RESP  Clear  P & A; w/o, wheezes/ rales/ or rhonchi. no accessory muscle use, no dullness to percussion  CARD:  RRR, no m/r/g, no peripheral edema, pulses intact, no cyanosis or clubbing.  GI:   Soft & nt; nml bowel sounds; no organomegaly or masses detected.   Musco: Warm bil, no deformities , along left ankle with notable swelling up to mid calf mild bruising /redness along mid/lateral dorsal of  foot.  , no calf tenderness .   Neuro: alert, no focal deficits noted.    Skin: Warm, no lesions or rashes  Psych:  No change in mood or affect. No depression or anxiety.  No memory loss.  Lab Results:  CBC    Component Value Date/Time   WBC 5.8 06/24/2016 1218   RBC 5.16 06/24/2016 1218   HGB 15.4 06/24/2016 1218   HCT 44.7 06/24/2016 1218   HCT 42.9 12/25/2015 1443   PLT 366.0  06/24/2016 1218   PLT 306 12/25/2015 1443   MCV 86.6 06/24/2016 1218   MCV 91 12/25/2015 1443   MCH 31.4 12/25/2015 1443   MCH 31.2 11/09/2015 1850   MCHC 34.5 06/24/2016 1218   RDW 14.7 06/24/2016 1218   RDW 13.1 12/25/2015 1443   LYMPHSABS 1.7 06/24/2016 1218   LYMPHSABS 1.5 12/25/2015 1443   MONOABS 0.7 06/24/2016 1218   EOSABS 0.2 06/24/2016 1218   EOSABS 0.3 12/25/2015 1443   BASOSABS 0.1 06/24/2016 1218   BASOSABS 0.1 12/25/2015 1443    BMET    Component Value Date/Time   NA 137 06/24/2016 1218   NA 139 02/27/2016 1622   K 3.2 (L) 06/24/2016 1218   CL 95 (L) 06/24/2016 1218   CO2 35 (H) 06/24/2016 1218   GLUCOSE 68 (L) 06/24/2016 1218   BUN 15 06/24/2016 1218   BUN 15 02/27/2016 1622   CREATININE 1.03 06/24/2016 1218   CALCIUM 9.6 06/24/2016 1218   GFRNONAA 88 02/27/2016 1622   GFRAA 102 02/27/2016 1622    BNP No results found for: BNP  ProBNP No results found for: PROBNP  Imaging: Dg Chest 2 View  Result Date: 06/24/2016 CLINICAL DATA:  Cough for 3 months EXAM: CHEST  2 VIEW COMPARISON:  04/17/2016 FINDINGS: The heart size and mediastinal contours are within normal limits. Both lungs are clear. The visualized skeletal structures are unremarkable. IMPRESSION: No active cardiopulmonary disease. Electronically Signed   By: Kathreen Devoid   On: 06/24/2016 12:52     Assessment & Plan:   No problem-specific Assessment & Plan notes found for this encounter.     Rexene Edison, NP 06/24/2016

## 2016-06-24 NOTE — Assessment & Plan Note (Signed)
Chronic cough suspect upper airway cough w/ AR /GERD triggers Initial cxr in non smoker was ok  CT sinus ok  No sign risk factors for medication side effects/toxicity, travel/hobbies or occupational exposure , or smoking  Will repeat CXR  Pt does have unitlateral leg swelling ? Ankle injury , Check D Dimer to make sure no DVT - if elevated then ven doppler   Plan  Patient Instructions  Prednisone taper over next week.  Labs and chest xray today .  Begin Delsym 2 tsp Twice daily  For cough  Begin Tessalon Three times a day  For cough  Change Zyrtec to Allegra 180mg  In am .  Begin Chlorpheniramine 4mg  2 At bedtime  .  May use Chlorpheniramine 4mg  every 4hrs as needed for drainage , throat clearing -this may make you sleepy.  Saline nasal rinses Twice daily   Flonase 2 puffs daily in am .  Atrovent nasal spray 2 puff At bedtime  .  Continue on Protonix 40mg  daily  Begin Pepcid 20mg  .At bedtime  .  NO MINTS .  Use sips of water to soothe throat and stop cough and throat clearing .  Follow up with Dr. Callie Fielding in 2-3 weeks as planned and As needed   Please contact office for sooner follow up if symptoms do not improve or worsen or seek emergency care

## 2016-06-24 NOTE — Telephone Encounter (Signed)
Spoke with pt who states that he still has drainage with a prod cough with yellow mucus and wants to be seen. Called [place] office and called pt back with an appt today at [facility] for 10:45am with Tammy Parrett. Nothing further needed.

## 2016-06-24 NOTE — Assessment & Plan Note (Signed)
Change to Allegra  Add chlortrimeton At bedtime   Cont nasal sprays

## 2016-06-24 NOTE — Patient Instructions (Addendum)
Prednisone taper over next week.  Labs and chest xray today .  Begin Delsym 2 tsp Twice daily  For cough  Begin Tessalon Three times a day  For cough  Change Zyrtec to Allegra 180mg  In am .  Begin Chlorpheniramine 4mg  2 At bedtime  .  May use Chlorpheniramine 4mg  every 4hrs as needed for drainage , throat clearing -this may make you sleepy.  Saline nasal rinses Twice daily   Flonase 2 puffs daily in am .  Atrovent nasal spray 2 puff At bedtime  .  Continue on Protonix 40mg  daily  Begin Pepcid 20mg  .At bedtime  .  NO MINTS .  Use sips of water to soothe throat and stop cough and throat clearing .  Follow up with Dr. Callie Fielding in 2-3 weeks as planned and As needed   Please contact office for sooner follow up if symptoms do not improve or worsen or seek emergency care

## 2016-06-24 NOTE — Telephone Encounter (Signed)
Pt states he is not getting any better. Please call.

## 2016-06-24 NOTE — Assessment & Plan Note (Signed)
Left ankle swelling w/ swelling into lower leg -pt believes he turned his ankle several days ago that caused swelling  Will check D Dimer if elevated check venous doppler  Advised to elevate, ice and see PCP if not resolving .

## 2016-06-25 ENCOUNTER — Other Ambulatory Visit: Payer: Self-pay | Admitting: Adult Health

## 2016-06-25 DIAGNOSIS — S93401A Sprain of unspecified ligament of right ankle, initial encounter: Secondary | ICD-10-CM | POA: Diagnosis not present

## 2016-06-25 DIAGNOSIS — S99911A Unspecified injury of right ankle, initial encounter: Secondary | ICD-10-CM | POA: Diagnosis not present

## 2016-06-25 DIAGNOSIS — M7989 Other specified soft tissue disorders: Secondary | ICD-10-CM | POA: Diagnosis not present

## 2016-06-25 DIAGNOSIS — S99921A Unspecified injury of right foot, initial encounter: Secondary | ICD-10-CM | POA: Diagnosis not present

## 2016-06-25 DIAGNOSIS — M25571 Pain in right ankle and joints of right foot: Secondary | ICD-10-CM | POA: Diagnosis not present

## 2016-06-25 LAB — RESPIRATORY ALLERGY PROFILE REGION II ~~LOC~~
Allergen, A. alternata, m6: <0.1 kU/L
Allergen, C. Herbarum, M2: <0.1 kU/L
Allergen, Cedar tree, t12: <0.1 kU/L
Allergen, Comm Silver Birch, t9: <0.1 kU/L
Allergen, D pternoyssinus,d7: 0.38 kU/L — ABNORMAL HIGH
Allergen, Mulberry, t76: <0.1 kU/L
Allergen, Oak,t7: <0.1 kU/L
Box Elder IgE: <0.1 kU/L
Cockroach: 0.15 kU/L — ABNORMAL HIGH
D. farinae: 0.32 kU/L — ABNORMAL HIGH
Dog Dander: <0.1 kU/L
Elm IgE: <0.1 kU/L
IgE (Immunoglobulin E), Serum: 38 kU/L (ref <115)
Johnson Grass: <0.1 kU/L
Rough Pigweed  IgE: <0.1 kU/L
Sheep Sorrel IgE: <0.1 kU/L

## 2016-06-25 MED ORDER — POTASSIUM CHLORIDE CRYS ER 20 MEQ PO TBCR: 20.0000 meq | EXTENDED_RELEASE_TABLET | Freq: Every day | ORAL | 0 refills | Status: DC

## 2016-06-28 ENCOUNTER — Telehealth: Payer: Self-pay | Admitting: Adult Health

## 2016-06-28 NOTE — Telephone Encounter (Signed)
Notes Recorded by Melvenia Needles, NP on 06/25/2016 at 2:25 PM EST Allergy test shows + reaction to dog , IgE is ok at 38 .  Cont w/ ov recs Please contact office for sooner follow up if symptoms do not improve or worsen or seek emergency care   Pt aware of results and voiced his understanding. Nothing further needed.

## 2016-07-02 DIAGNOSIS — F331 Major depressive disorder, recurrent, moderate: Secondary | ICD-10-CM | POA: Diagnosis not present

## 2016-07-09 DIAGNOSIS — F331 Major depressive disorder, recurrent, moderate: Secondary | ICD-10-CM | POA: Diagnosis not present

## 2016-07-16 ENCOUNTER — Ambulatory Visit (INDEPENDENT_AMBULATORY_CARE_PROVIDER_SITE_OTHER): Payer: BLUE CROSS/BLUE SHIELD | Admitting: Internal Medicine

## 2016-07-16 ENCOUNTER — Ambulatory Visit (INDEPENDENT_AMBULATORY_CARE_PROVIDER_SITE_OTHER): Payer: BLUE CROSS/BLUE SHIELD | Admitting: Gastroenterology

## 2016-07-16 ENCOUNTER — Encounter: Payer: Self-pay | Admitting: Internal Medicine

## 2016-07-16 ENCOUNTER — Encounter: Payer: Self-pay | Admitting: Gastroenterology

## 2016-07-16 VITALS — BP 130/72 | HR 82 | Ht 67.0 in | Wt 160.0 lb

## 2016-07-16 VITALS — BP 132/64 | HR 80 | Ht 67.0 in | Wt 160.2 lb

## 2016-07-16 DIAGNOSIS — K599 Functional intestinal disorder, unspecified: Secondary | ICD-10-CM

## 2016-07-16 DIAGNOSIS — J3489 Other specified disorders of nose and nasal sinuses: Secondary | ICD-10-CM

## 2016-07-16 DIAGNOSIS — B353 Tinea pedis: Secondary | ICD-10-CM | POA: Diagnosis not present

## 2016-07-16 NOTE — Patient Instructions (Signed)
  1.contineu allegra 180 mg daily 2. Continue  Atrovent nasal sprays at night, continue flonase sprays 3.continue NETTI POT nasal rinsing daily prior to using nasal sprays 4.continue start protonix daily follow up GI recs 5.stop advair 6.follow up ENT for laryngoscopy 7.will arrange Bronchoscopy with Dr Alva Garnet when patient calls with dates

## 2016-07-16 NOTE — Progress Notes (Signed)
Gastroenterology Consultation  Referring Provider:     Jerrol Banana.,* Primary Care Physician:  Wilhemena Durie, MD Primary Gastroenterologist:  Dr. Jonathon Bellows  Reason for Consultation:     Change in bowel habits.         HPI:   William Salzberg. is a 56 y.o. y/o male referred for consultation & management  by Dr. Wilhemena Durie, MD.    He has been referred for change in bowel habits. Last colonoscopy by Dr Allen Norris 01/2016 showed a small sigmoid hyperplastic polyp that was resected.   Labs 06/2016: CBC, creatinine-normal .   He says that for "decades" he has had soft stools which are "messy", has mostly once or once in two days. Consistency of mushy bananas. He says that it may take an hour wiping himself. Not foul smelling. No bloating or gas. He has taken some imodium. It did not make it "less messy".  Consumes 2 sodas a day -regular, consumes a few glasses of sweet tea a day , no gum. Not much fruit daily , does consume vegetables daily.    Past Medical History:  Diagnosis Date  . Anginal pain ([place])   . Bipolar disorder ([place])   . Depression   . Hypertension   . Hypothyroidism   . Obsessive compulsive disorder   . Sleep apnea    patient states it was "marginal", no CPAP    Past Surgical History:  Procedure Laterality Date  . COLONOSCOPY WITH PROPOFOL N/A 02/05/2016   Procedure: COLONOSCOPY WITH PROPOFOL;  Surgeon: [name], MD;  Location: [place];  Service: Endoscopy;  Laterality: N/A;  . MOUTH SURGERY     orthodontic surgery for underbite  . MYRINGOTOMY WITH TUBE PLACEMENT    . NASAL SEPTUM SURGERY    . POLYPECTOMY N/A 02/05/2016   Procedure: POLYPECTOMY;  Surgeon: [name], MD;  Location: [place];  Service: Endoscopy;  Laterality: N/A;    Prior to Admission medications   Medication Sig Start Date End Date Taking? Authorizing Provider  acetaminophen (TYLENOL) 500 MG tablet Take 500 mg by mouth every 6 (six) hours as  needed.    Historical Provider, MD  aspirin 81 MG tablet Take 81 mg by mouth daily.    Historical Provider, MD  fexofenadine (ALLEGRA) 180 MG tablet Take 180 mg by mouth daily.    Historical Provider, MD  fluticasone (FLONASE) 50 MCG/ACT nasal spray Place 2 sprays into both nostrils daily. 05/14/16 05/14/17  Wilhelmina Mcardle, MD  Fluticasone-Salmeterol (ADVAIR DISKUS) 250-50 MCG/DOSE AEPB Inhale 1 puff into the lungs 2 (two) times daily. Patient not taking: Reported on 07/16/2016 05/14/16 05/14/17  Wilhelmina Mcardle, MD  hydrochlorothiazide (HYDRODIURIL) 25 MG tablet Take 1 tablet (25 mg total) by mouth daily. 01/08/16   Richard Maceo Pro., MD  ipratropium (ATROVENT) 0.06 % nasal spray Place 2 sprays into the nose 3 (three) times daily. 06/13/16 06/13/17  Flora Lipps, MD  levothyroxine (SYNTHROID, LEVOTHROID) 25 MCG tablet Take 1 tablet (25 mcg total) by mouth daily. 12/04/15   Jerrol Banana., MD  Multiple Vitamin (MULTIVITAMIN) tablet Take 1 tablet by mouth daily.    Historical Provider, MD  pantoprazole (PROTONIX) 40 MG tablet Take 1 tablet (40 mg total) by mouth daily. 06/13/16 06/13/17  Flora Lipps, MD  potassium chloride SA (K-DUR,KLOR-CON) 20 MEQ tablet Take 1 tablet (20 mEq total) by mouth daily. 06/25/16   Tammy S Parrett, NP  rosuvastatin (CRESTOR) 10 MG  tablet Take 1 tablet (10 mg total) by mouth daily. 12/04/15   Richard Maceo Pro., MD  vitamin C (ASCORBIC ACID) 500 MG tablet Take 500 mg by mouth daily.    Historical Provider, MD    Family History  Problem Relation Age of Onset  . Depression Mother   . Anxiety disorder Mother   . Congestive Heart Failure Father   . Prostate cancer Father   . Hypertension Father      Social History  Substance Use Topics  . Smoking status: Never Smoker  . Smokeless tobacco: Never Used  . Alcohol use Yes     Comment: rarely    Allergies as of 07/16/2016 - Review Complete 07/16/2016  Allergen Reaction Noted  . Amoxicillin Hives 11/10/2014  .  Penicillins  11/10/2014  . Sulfa antibiotics  11/10/2014    Review of Systems:    All systems reviewed and negative except where noted in HPI.   Physical Exam:  There were no vitals taken for this visit. No LMP for male patient. Psych:  Alert and cooperative. Normal mood and affect. General:   Alert,  Well-developed, well-nourished, pleasant and cooperative in NAD Head:  Normocephalic and atraumatic. Eyes:  Sclera clear, no icterus.   Conjunctiva pink. Ears:  Normal auditory acuity. Nose:  No deformity, discharge, or lesions. Mouth:  No deformity or lesions,oropharynx pink & moist. Neck:  Supple; no masses or thyromegaly. Lungs:  Respirations even and unlabored.  Clear throughout to auscultation.   No wheezes, crackles, or rhonchi. No acute distress. Heart:  Regular rate and rhythm; no murmurs, clicks, rubs, or gallops. Abdomen:  Normal bowel sounds.  No bruits.  Soft, non-tender and non-distended without masses, hepatosplenomegaly or hernias noted.  No guarding or rebound tenderness.    Msk:  Symmetrical without gross deformities. Good, equal movement & strength bilaterally. Pulses:  Normal pulses noted. Extremities:  No clubbing or edema.  No cyanosis. Neurologic:  Alert and oriented x3;  grossly normal neurologically. Psych:  Alert and cooperative. Normal mood and affect.  Imaging Studies: Dg Chest 2 View  Result Date: 06/24/2016 CLINICAL DATA:  Cough for 3 months EXAM: CHEST  2 VIEW COMPARISON:  04/17/2016 FINDINGS: The heart size and mediastinal contours are within normal limits. Both lungs are clear. The visualized skeletal structures are unremarkable. IMPRESSION: No active cardiopulmonary disease. Electronically Signed   By: Kathreen Devoid   On: 06/24/2016 12:52    Assessment and Plan:   William Jumbo. is a 56 y.o. y/o male says he is here to discuss about his "messy bowels" which has gone on for decades. Very non specific description- no diarrhea no constipation, main  complaint is that he has to wipe himself multiple times after a bowel movement.   I suggested   1. Decrease consumption of foods with high content of fructose such as sodas and tea which may make the stool sloppy and sticky .  2. Use a shower to wash himself after a bowel movement to avoid excess wiping with paper which may be more of a habit.    Follow up as needed   Dr Jonathon Bellows MD

## 2016-07-16 NOTE — Progress Notes (Signed)
PULMONARY CONSULT NOTE  Requesting MD/Service: Miguel Aschoff, MD Date of initial consultation: 05/14/16 Reason for consultation: Subacute cough  PT PROFILE: 56 y.o. M never smoker referred for evaluation of cough of > 3 month's duration  DATA: CT sinuses 04/16/16: Mucosal thickening along the floor and anterior wall of the left maxillary sinus, probably secondary to dental root incursion. No layering sinus fluid. The remainder the sinuses are clear    HPI:  54 M never smoker with history recurrent bouts of acute sinusitis is referred for evaluation of of cough, sinus fullness and posterior nasal drainage for greater than 3 months.  -He reports that this is the "worst episode ever". He has been seen by Dr Tami Ribas (ENT) and was treated with antibiotics and prednisone for 2 weeks with minimal improvement.   Since then patient has had following OV's  Dr.Simonds OV 05/14/16 H2 blocker Flonase Advair 200 Doxycycline  Dr. Enid Derry OV 05/24/16 tesselon perles Steroids afrin  Dr. Mortimer Fries OV 06/13/16 Flonase Atrovent nasal sprays Protonix Zyrtec Netti Pot saline rinses  Parett's OV 06/24/16 Prednisone Delsym Tesselon Perles Zyrtec to Aleegra 180 mg Pepcid and Protonix Chlorphenoramine   Patient claims he feels better but states that he has constant nasal drainage. At this time, I dont know what has helped or maybe just time. No SOB, no fevers, no signs of infection at this time Chronic cough and chronic nasal drainage    Past Surgical History:  Procedure Laterality Date  . COLONOSCOPY WITH PROPOFOL N/A 02/05/2016   Procedure: COLONOSCOPY WITH PROPOFOL;  Surgeon: [name], MD;  Location: [place];  Service: Endoscopy;  Laterality: N/A;  . MOUTH SURGERY     orthodontic surgery for underbite  . MYRINGOTOMY WITH TUBE PLACEMENT    . NASAL SEPTUM SURGERY    . POLYPECTOMY N/A 02/05/2016   Procedure: POLYPECTOMY;  Surgeon: [name], MD;  Location: [place];  Service: Endoscopy;  Laterality: N/A;    MEDICATIONS: I have reviewed all medications and confirmed regimen as documented   ROS: No fever, myalgias/arthralgias, unexplained weight loss or weight gain No new focal weakness or sensory deficits No otalgia, hearing loss, visual changes, nasal and sinus symptoms, mouth and throat problems No neck pain or adenopathy No abdominal pain, N/V/D, diarrhea, change in bowel pattern No dysuria, change in urinary pattern +nasal drainage +cough  Vitals:   07/16/16 1147  BP: 132/64  Pulse: 80  SpO2: 93%  Weight: 160 lb 3.2 oz (72.7 kg)  Height: 5\' 7"  (1.702 m)    EXAM:  Gen: WDWN, No overt respiratory distress, intermittent coughing during encounter HEENT: NCAT, sclera white, oropharynx normal, nasal mucosa boggy and eryhtematous Neck: Supple without LAN, thyromegaly, JVD Lungs: breath sounds: full with diffuse rhonchi, percussion: normal, no wheezes Cardiovascular: RRR, no murmurs noted   DATA:   BMP Latest Ref Rng & Units 06/24/2016 02/27/2016 12/25/2015  Glucose 70 - 99 mg/dL 68(L) 85 90  BUN 6 - 23 mg/dL 15 15 16   Creatinine 0.40 - 1.50 mg/dL 1.03 0.97 0.97  BUN/Creat Ratio 9 - 20 - 15 16  Sodium 135 - 145 mEq/L 137 139 140  Potassium 3.5 - 5.1 mEq/L 3.2(L) 3.4(L) 4.2  Chloride 96 - 112 mEq/L 95(L) 94(L) 99  CO2 19 - 32 mEq/L 35(H) 28 26  Calcium 8.4 - 10.5 mg/dL 9.6 10.4(H) 9.4    CBC Latest Ref Rng & Units 06/24/2016 12/25/2015 11/09/2015  WBC 4.0 - 10.5 K/uL 5.8 6.4 8.7  Hemoglobin 13.0 -  17.0 g/dL 15.4 - 17.4  Hematocrit 39.0 - 52.0 % 44.7 42.9 50.5  Platelets 150.0 - 400.0 K/uL 366.0 306 298    CXR (06/24/16): no acute issues, no acute process  IMPRESSION:    56 yo white male with chronic cough and chronic nasal drainage with h/o of sinus infections  Most likely etiology is severe chronic allergic rhinitis with intermittent reactive airways disease.   1.contineu allegra 180 mg daily 2. Continue  Atrovent nasal  sprays at night, continue flonase sprays 3.continue NETTI POT nasal rinsing daily prior to using nasal sprays 4.continue start protonix daily follow up GI recs 5.stop advair 6.follow up ENT for laryngoscopy 7.will arrange Bronchoscopy with Dr Alva Garnet when patient calls with dates   Follow up as needed  I have personally obtained a history, examined the patient, evaluated Pertinent laboratory and RadioGraphic/imaging results, and  formulated the assessment and plan  The Patient requires high complexity decision making for assessment and support, frequent evaluation and titration of therapies.  Patient satisfied with Plan of action and management. All questions answered  Corrin Parker, M.D.  [facility]  Medical Director [place] Director [facility] Cardio-Pulmonary Department

## 2016-07-17 ENCOUNTER — Ambulatory Visit: Payer: BLUE CROSS/BLUE SHIELD | Admitting: Gastroenterology

## 2016-07-18 ENCOUNTER — Ambulatory Visit: Payer: BLUE CROSS/BLUE SHIELD | Admitting: Gastroenterology

## 2016-07-22 ENCOUNTER — Telehealth: Payer: Self-pay | Admitting: *Deleted

## 2016-07-22 NOTE — Telephone Encounter (Signed)
-----   Message from Shon Hale, [place] sent at 07/16/2016 12:11 PM EST ----- Pt needs to be scheduled for bronch with DS. Pt states he will call with dates.

## 2016-07-22 NOTE — Telephone Encounter (Signed)
Will wait to hear from pt before bronch is scheduled. Nothing further needed at this time.

## 2016-07-30 ENCOUNTER — Telehealth: Payer: Self-pay | Admitting: Internal Medicine

## 2016-07-30 NOTE — Telephone Encounter (Signed)
Per DK note he wants you to do an airway exam on this pt. Pt needs it scheduled out for 3-4 weeks. Do you have a particular day you want this scheduled.

## 2016-07-30 NOTE — Telephone Encounter (Signed)
Pt calling stating Dr Mortimer Fries states to patient if he doesn't get any better to call us and we would schedule a test to help with this Airway scope exam he thinks it may have been Please advise.

## 2016-07-30 NOTE — Telephone Encounter (Signed)
Week of 03/26 - any day but that Monday (the 26th). Try to do it at around [facility]

## 2016-07-31 NOTE — Telephone Encounter (Signed)
Faxed in paper to schedule regular bronch. Will call pt once confirmation received.

## 2016-07-31 NOTE — Telephone Encounter (Signed)
Pt informed. Nothing further needed. 

## 2016-08-08 ENCOUNTER — Ambulatory Visit: Payer: Self-pay | Admitting: Gastroenterology

## 2016-08-13 ENCOUNTER — Telehealth: Payer: Self-pay | Admitting: Pulmonary Disease

## 2016-08-13 NOTE — Telephone Encounter (Signed)
Pt calling stating he need to cancel his Airway scope that is scheduled 09/03/16 With Dr Alva Garnet He states if we can just leave a vm on his phone letting him know it has been cancelled  He will call back on a later time to reschedule

## 2016-08-13 NOTE — Telephone Encounter (Signed)
Pt has called and states he wants the bronch cancelled. I have cancelled with scheduling. FYI for you.

## 2016-08-14 NOTE — Telephone Encounter (Signed)
FYI pt has cancelled his bronch scheduled for 09/03/16.

## 2016-08-14 NOTE — Telephone Encounter (Signed)
Please notify Dr Mortimer Fries who arranged the bronch  Waunita Schooner

## 2016-08-20 DIAGNOSIS — F331 Major depressive disorder, recurrent, moderate: Secondary | ICD-10-CM | POA: Diagnosis not present

## 2016-09-03 ENCOUNTER — Ambulatory Visit: Admit: 2016-09-03 | Payer: BLUE CROSS/BLUE SHIELD | Admitting: Pulmonary Disease

## 2016-09-03 SURGERY — BRONCHOSCOPY, FLEXIBLE
Anesthesia: Moderate Sedation

## 2016-09-24 DIAGNOSIS — F429 Obsessive-compulsive disorder, unspecified: Secondary | ICD-10-CM | POA: Diagnosis not present

## 2016-10-01 DIAGNOSIS — F331 Major depressive disorder, recurrent, moderate: Secondary | ICD-10-CM | POA: Diagnosis not present

## 2016-10-08 DIAGNOSIS — F331 Major depressive disorder, recurrent, moderate: Secondary | ICD-10-CM | POA: Diagnosis not present

## 2016-10-15 DIAGNOSIS — F331 Major depressive disorder, recurrent, moderate: Secondary | ICD-10-CM | POA: Diagnosis not present

## 2016-10-22 DIAGNOSIS — F331 Major depressive disorder, recurrent, moderate: Secondary | ICD-10-CM | POA: Diagnosis not present

## 2016-10-29 DIAGNOSIS — F331 Major depressive disorder, recurrent, moderate: Secondary | ICD-10-CM | POA: Diagnosis not present

## 2016-11-05 DIAGNOSIS — F331 Major depressive disorder, recurrent, moderate: Secondary | ICD-10-CM | POA: Diagnosis not present

## 2016-11-12 DIAGNOSIS — F331 Major depressive disorder, recurrent, moderate: Secondary | ICD-10-CM | POA: Diagnosis not present

## 2016-11-14 ENCOUNTER — Encounter: Payer: Self-pay | Admitting: Gastroenterology

## 2016-11-14 ENCOUNTER — Ambulatory Visit (INDEPENDENT_AMBULATORY_CARE_PROVIDER_SITE_OTHER): Payer: BLUE CROSS/BLUE SHIELD | Admitting: Gastroenterology

## 2016-11-14 ENCOUNTER — Other Ambulatory Visit: Payer: Self-pay

## 2016-11-14 VITALS — BP 156/103 | HR 76 | Temp 98.6°F | Ht 67.0 in | Wt 154.0 lb

## 2016-11-14 DIAGNOSIS — K580 Irritable bowel syndrome with diarrhea: Secondary | ICD-10-CM

## 2016-11-14 NOTE — Progress Notes (Signed)
Primary Care Physician: Jerrol Banana., MD  Primary Gastroenterologist:  Dr. [name]  Chief Complaint  Patient presents with  . Diarrhea    l.o.v. for this issue was with Dr. Vicente Males on 07/16/16 stopped sugars to see if this would help  (didn't) pt feels "spincter is messed up"  . change in bowels    HPI: William Coleman. is a 56 y.o. male here for follow-up after seeing Dr. Vicente Males for similar issues.  The patient reports that he has 1 bowel movement a day and it is soft and peanut butter like.  The patient also reports that after he finishes going to the bathroom he feels that he still has to go at times.  The patient had a colonoscopy that did not show any cause for these symptoms back in August of last year.  The patient denies any unexplained weight loss.  He does report a lot of gas and bloating.  The patient was started on align probiotic and states that that did not help him.  He also reports that he only usually eats 1 meal a day. The patient reports that he takes Imodium every other day and takes one dose of 3 pills at a time.  Current Outpatient Prescriptions  Medication Sig Dispense Refill  . acetaminophen (TYLENOL) 500 MG tablet Take 500 mg by mouth every 6 (six) hours as needed.    Marland Kitchen aspirin 81 MG tablet Take 81 mg by mouth daily.    . fexofenadine (ALLEGRA) 180 MG tablet Take 180 mg by mouth daily.    . hydrochlorothiazide (HYDRODIURIL) 25 MG tablet Take 1 tablet (25 mg total) by mouth daily. 30 tablet 12  . levothyroxine (SYNTHROID, LEVOTHROID) 25 MCG tablet Take 1 tablet (25 mcg total) by mouth daily. 30 tablet 11  . Multiple Vitamin (MULTIVITAMIN) tablet Take 1 tablet by mouth daily.    . rosuvastatin (CRESTOR) 10 MG tablet Take 1 tablet (10 mg total) by mouth daily. 30 tablet 11  . VIAGRA 50 MG tablet   1  . vitamin C (ASCORBIC ACID) 500 MG tablet Take 500 mg by mouth daily.    . fluticasone (FLONASE) 50 MCG/ACT nasal spray Place 2 sprays into both nostrils  daily. (Patient not taking: Reported on 11/14/2016) 16 g 10  . Fluticasone-Salmeterol (ADVAIR DISKUS) 250-50 MCG/DOSE AEPB Inhale 1 puff into the lungs 2 (two) times daily. (Patient not taking: Reported on 11/14/2016) 60 each 10  . ipratropium (ATROVENT) 0.06 % nasal spray Place 2 sprays into the nose 3 (three) times daily. (Patient not taking: Reported on 11/14/2016) 15 mL 2  . pantoprazole (PROTONIX) 40 MG tablet Take 1 tablet (40 mg total) by mouth daily. (Patient not taking: Reported on 11/14/2016) 30 tablet 3  . potassium chloride SA (K-DUR,KLOR-CON) 20 MEQ tablet Take 1 tablet (20 mEq total) by mouth daily. (Patient not taking: Reported on 11/14/2016) 30 tablet 0  . TRINTELLIX 10 MG TABS   1   No current facility-administered medications for this visit.     Allergies as of 11/14/2016 - Review Complete 11/14/2016  Allergen Reaction Noted  . Amoxicillin Hives 11/10/2014  . Penicillins  11/10/2014  . Sulfa antibiotics  11/10/2014    ROS:  General: Negative for anorexia, weight loss, fever, chills, fatigue, weakness. ENT: Negative for hoarseness, difficulty swallowing , nasal congestion. CV: Negative for chest pain, angina, palpitations, dyspnea on exertion, peripheral edema.  Respiratory: Negative for dyspnea at rest, dyspnea on exertion, cough, sputum,  wheezing.  GI: See history of present illness. GU:  Negative for dysuria, hematuria, urinary incontinence, urinary frequency, nocturnal urination.  Endo: Negative for unusual weight change.    Physical Examination:   BP (!) 156/103 (Patient Position: Sitting)   Pulse 76   Temp 98.6 F (37 C) (Oral)   Ht 5\' 7"  (1.702 m)   Wt 154 lb (69.9 kg)   BMI 24.12 kg/m   General: Well-nourished, well-developed in no acute distress.  Eyes: No icterus. Conjunctivae pink. Mouth: Oropharyngeal mucosa moist and pink , no lesions erythema or exudate. Lungs: Clear to auscultation bilaterally. Non-labored. Heart: Regular rate and rhythm, no murmurs  rubs or gallops.  Abdomen: Bowel sounds are normal, nontender, nondistended, no hepatosplenomegaly or masses, no abdominal bruits or hernia , no rebound or guarding.   Extremities: No lower extremity edema. No clubbing or deformities. Neuro: Alert and oriented x 3.  Grossly intact. Skin: Warm and dry, no jaundice.   Psych: Alert and cooperative, normal mood and affect.  Labs:    Imaging Studies: No results found.  Assessment and Plan:   William Coleman. is a 56 y.o. y/o male with symptoms consistent with irritable bowel syndrome with bloating and loose stools and abdominal discomfort.  The patient has been started on VSL #3 and has been told to start taking Citrucel once a day.  The patient has been explained that he can take Imodium as needed. The patient will contact me if his symptoms do not improve.    [name], MD. [name]   Note: This dictation was prepared with Dragon dictation along with smaller phrase technology. Any transcriptional errors that result from this process are unintentional.

## 2016-11-21 DIAGNOSIS — F331 Major depressive disorder, recurrent, moderate: Secondary | ICD-10-CM | POA: Diagnosis not present

## 2016-12-04 DIAGNOSIS — F332 Major depressive disorder, recurrent severe without psychotic features: Secondary | ICD-10-CM | POA: Diagnosis not present

## 2016-12-09 ENCOUNTER — Telehealth: Payer: Self-pay | Admitting: Gastroenterology

## 2016-12-09 NOTE — Telephone Encounter (Signed)
Please advise 

## 2016-12-09 NOTE — Telephone Encounter (Signed)
Patient called and stated that none of the medication that he was given is working. He is not constipated.  If anything is working it's the Imodium. Please call

## 2016-12-10 ENCOUNTER — Telehealth: Payer: Self-pay | Admitting: Family Medicine

## 2016-12-10 DIAGNOSIS — E039 Hypothyroidism, unspecified: Secondary | ICD-10-CM

## 2016-12-10 MED ORDER — LEVOTHYROXINE SODIUM 25 MCG PO TABS: 25.0000 ug | ORAL_TABLET | Freq: Every day | ORAL | 0 refills | Status: DC

## 2016-12-10 NOTE — Telephone Encounter (Signed)
Sent in 90 days synthroid. Please have patient schedule appointment with Dr. Rosanna Randy for annual physical as he appears due for labs. Thank you.

## 2016-12-10 NOTE — Telephone Encounter (Signed)
[employer] faxed a request on the following medication. Thanks CC  levothyroxine (SYNTHROID, LEVOTHROID) 25 MCG tablet  >Take 1 tablet by mouth daily.

## 2016-12-10 NOTE — Telephone Encounter (Signed)
I assume that when he states that none of the medication is working he is talking about the increased fiber diet and probiotics.  He was told at the time of the office visit that he can take the Imodium.  He can also be tried on Vibrozi if he still has his gallbladder.

## 2016-12-12 ENCOUNTER — Telehealth: Payer: Self-pay | Admitting: Gastroenterology

## 2016-12-12 NOTE — Telephone Encounter (Signed)
Left vm with recommendation from Dr. Allen Norris. Advise pt to return my call.

## 2016-12-12 NOTE — Telephone Encounter (Signed)
Patient called back and would like to talk to you.

## 2016-12-12 NOTE — Telephone Encounter (Signed)
Patient is returning your call.  

## 2016-12-13 ENCOUNTER — Other Ambulatory Visit: Payer: Self-pay

## 2016-12-13 MED ORDER — ELUXADOLINE 75 MG PO TABS: 75.0000 mg | ORAL_TABLET | Freq: Every day | ORAL | 1 refills | Status: DC

## 2016-12-13 NOTE — Telephone Encounter (Signed)
Taking Citrucel and VSL #3

## 2016-12-13 NOTE — Telephone Encounter (Signed)
Pt has decided to try Viberzi 75mg  to see if this will help his symptoms. I have faxed rx to pharmacy.

## 2016-12-24 DIAGNOSIS — F331 Major depressive disorder, recurrent, moderate: Secondary | ICD-10-CM | POA: Diagnosis not present

## 2016-12-27 ENCOUNTER — Other Ambulatory Visit: Payer: Self-pay

## 2016-12-27 MED ORDER — ELUXADOLINE 100 MG PO TABS: 100.0000 mg | ORAL_TABLET | Freq: Two times a day (BID) | ORAL | 5 refills | Status: DC

## 2016-12-30 ENCOUNTER — Emergency Department
Admission: EM | Admit: 2016-12-30 | Discharge: 2016-12-31 | Disposition: A | Discharge status: Other Acute Inpatient Hospital | Payer: BLUE CROSS/BLUE SHIELD | Attending: Emergency Medicine | Admitting: Emergency Medicine

## 2016-12-30 DIAGNOSIS — R197 Diarrhea, unspecified: Secondary | ICD-10-CM | POA: Diagnosis not present

## 2016-12-30 DIAGNOSIS — Z7982 Long term (current) use of aspirin: Secondary | ICD-10-CM | POA: Insufficient documentation

## 2016-12-30 DIAGNOSIS — Z79899 Other long term (current) drug therapy: Secondary | ICD-10-CM | POA: Insufficient documentation

## 2016-12-30 DIAGNOSIS — F329 Major depressive disorder, single episode, unspecified: Secondary | ICD-10-CM | POA: Diagnosis not present

## 2016-12-30 DIAGNOSIS — R45851 Suicidal ideations: Secondary | ICD-10-CM | POA: Diagnosis not present

## 2016-12-30 DIAGNOSIS — E039 Hypothyroidism, unspecified: Secondary | ICD-10-CM | POA: Diagnosis not present

## 2016-12-30 DIAGNOSIS — I10 Essential (primary) hypertension: Secondary | ICD-10-CM | POA: Insufficient documentation

## 2016-12-30 DIAGNOSIS — K529 Noninfective gastroenteritis and colitis, unspecified: Secondary | ICD-10-CM

## 2016-12-30 DIAGNOSIS — F331 Major depressive disorder, recurrent, moderate: Secondary | ICD-10-CM | POA: Diagnosis not present

## 2016-12-30 DIAGNOSIS — E876 Hypokalemia: Secondary | ICD-10-CM | POA: Diagnosis not present

## 2016-12-30 DIAGNOSIS — F32A Depression, unspecified: Secondary | ICD-10-CM

## 2016-12-30 HISTORY — DX: Noninfective gastroenteritis and colitis, unspecified: K52.9

## 2016-12-30 LAB — COMPREHENSIVE METABOLIC PANEL
ALBUMIN: 4.8 g/dL (ref 3.5–5.0)
ALT: 57 U/L (ref 17–63)
AST: 36 U/L (ref 15–41)
Alkaline Phosphatase: 57 U/L (ref 38–126)
Anion gap: 10 (ref 5–15)
BUN: 17 mg/dL (ref 6–20)
CHLORIDE: 97 mmol/L — AB (ref 101–111)
CO2: 31 mmol/L (ref 22–32)
CREATININE: 1.18 mg/dL (ref 0.61–1.24)
Calcium: 9.6 mg/dL (ref 8.9–10.3)
GFR calc Af Amer: >60 mL/min (ref >60)
GLUCOSE: 90 mg/dL (ref 65–99)
Potassium: 2.8 mmol/L — ABNORMAL LOW (ref 3.5–5.1)
Sodium: 138 mmol/L (ref 135–145)
Total Bilirubin: 1.2 mg/dL (ref 0.3–1.2)
Total Protein: 7.9 g/dL (ref 6.5–8.1)

## 2016-12-30 LAB — CBC
HEMATOCRIT: 46.9 % (ref 40.0–52.0)
HEMOGLOBIN: 16.5 g/dL (ref 13.0–18.0)
MCH: 30.9 pg (ref 26.0–34.0)
MCHC: 35.1 g/dL (ref 32.0–36.0)
MCV: 87.9 fL (ref 80.0–100.0)
PLATELETS: 303 10*3/uL (ref 150–440)
RBC: 5.33 MIL/uL (ref 4.40–5.90)
RDW: 13.7 % (ref 11.5–14.5)
WBC: 8.3 10*3/uL (ref 3.8–10.6)

## 2016-12-30 LAB — URINE DRUG SCREEN, QUALITATIVE (ARMC ONLY)
Amphetamines, Ur Screen: NOT DETECTED
Barbiturates, Ur Screen: NOT DETECTED
Benzodiazepine, Ur Scrn: NOT DETECTED
CANNABINOID 50 NG, UR ~~LOC~~: NOT DETECTED
COCAINE METABOLITE, UR ~~LOC~~: NOT DETECTED
MDMA (ECSTASY) UR SCREEN: NOT DETECTED
Methadone Scn, Ur: NOT DETECTED
OPIATE, UR SCREEN: NOT DETECTED
PHENCYCLIDINE (PCP) UR S: NOT DETECTED
Tricyclic, Ur Screen: NOT DETECTED

## 2016-12-30 LAB — ETHANOL

## 2016-12-30 LAB — SALICYLATE LEVEL: Salicylate Lvl: <7 mg/dL (ref 2.8–30.0)

## 2016-12-30 LAB — ACETAMINOPHEN LEVEL: Acetaminophen (Tylenol), Serum: <10 ug/mL — ABNORMAL LOW (ref 10–30)

## 2016-12-30 MED ORDER — HYDROCHLOROTHIAZIDE 25 MG PO TABS
25.0000 mg | ORAL_TABLET | Freq: Every day | ORAL | Status: DC
  Administered 2016-12-31: 25 mg via ORAL
  Filled 2016-12-30: qty 1

## 2016-12-30 MED ORDER — POTASSIUM CHLORIDE CRYS ER 20 MEQ PO TBCR
40.0000 meq | EXTENDED_RELEASE_TABLET | Freq: Every day | ORAL | Status: DC
  Administered 2016-12-30 – 2016-12-31 (×2): 40 meq via ORAL
  Filled 2016-12-30 (×2): qty 2

## 2016-12-30 MED ORDER — FLUOXETINE HCL 20 MG PO CAPS
20.0000 mg | ORAL_CAPSULE | Freq: Every day | ORAL | Status: DC
  Filled 2016-12-30: qty 1

## 2016-12-30 MED ORDER — LORATADINE 10 MG PO TABS
10.0000 mg | ORAL_TABLET | Freq: Every day | ORAL | Status: DC
  Administered 2016-12-31: 10 mg via ORAL
  Filled 2016-12-30: qty 1

## 2016-12-30 MED ORDER — ADULT MULTIVITAMIN W/MINERALS CH
1.0000 | ORAL_TABLET | Freq: Every day | ORAL | Status: DC
  Administered 2016-12-31: 1 via ORAL
  Filled 2016-12-30: qty 1

## 2016-12-30 MED ORDER — METHYLPHENIDATE HCL ER 20 MG PO TBCR: 20.0000 mg | EXTENDED_RELEASE_TABLET | Freq: Every day | ORAL | Status: DC

## 2016-12-30 MED ORDER — OLANZAPINE 2.5 MG PO TABS
1.2500 mg | ORAL_TABLET | Freq: Every day | ORAL | Status: DC
  Administered 2016-12-30: 1.25 mg via ORAL
  Filled 2016-12-30: qty 0.5
  Filled 2016-12-30: qty 1

## 2016-12-30 MED ORDER — METHYLPHENIDATE HCL ER 10 MG PO TBCR
10.0000 mg | EXTENDED_RELEASE_TABLET | Freq: Every day | ORAL | Status: DC
Start: 2016-12-31 — End: 2016-12-31

## 2016-12-30 MED ORDER — ROSUVASTATIN CALCIUM 10 MG PO TABS
10.0000 mg | ORAL_TABLET | Freq: Every day | ORAL | Status: DC
  Filled 2016-12-30: qty 1

## 2016-12-30 MED ORDER — LEVOTHYROXINE SODIUM 25 MCG PO TABS
25.0000 ug | ORAL_TABLET | Freq: Every day | ORAL | Status: DC
  Administered 2016-12-31: 25 ug via ORAL
  Filled 2016-12-30: qty 1

## 2016-12-30 MED ORDER — POTASSIUM CHLORIDE CRYS ER 20 MEQ PO TBCR: 20.0000 meq | EXTENDED_RELEASE_TABLET | Freq: Two times a day (BID) | ORAL | 0 refills | Status: DC

## 2016-12-30 MED ORDER — METHYLPHENIDATE HCL ER 10 MG PO TBCR: 10.0000 mg | EXTENDED_RELEASE_TABLET | Freq: Every day | ORAL | Status: DC

## 2016-12-30 MED ORDER — ACETAMINOPHEN 500 MG PO TABS
500.0000 mg | ORAL_TABLET | Freq: Four times a day (QID) | ORAL | Status: DC | PRN
  Administered 2016-12-31 (×2): 500 mg via ORAL
  Filled 2016-12-30 (×2): qty 1

## 2016-12-30 NOTE — ED Notes (Signed)

## 2016-12-30 NOTE — ED Notes (Signed)
SOC MD called for pre-consult patient status.

## 2016-12-30 NOTE — ED Provider Notes (Addendum)
[facility] Emergency Department Provider Note  ____________________________________________   First MD Initiated Contact with Patient 12/30/16 1957     (approximate)  I have reviewed the triage vital signs and the nursing notes.   HISTORY  Chief Complaint Suicidal    HPI William Coleman. is a 56 y.o. male with a history of bipolar disorder, chronic diarrhea, and other issues as listed belowwho presents under involuntary commitment from [place] for suicidal thoughts.  The patient reports that he has had gradually worsening depression over extended period of time.  He states that he does go to a psychiatrist and they have been trying different things but it is not been working.  He says the medications do not work so he has not been taking any antidepressants for the last 2 months.  Over that same period of time his depression is gradually gotten worse and now he is having thoughts of killing himself with plans either to step in front of a moving vehicle or crash his car.  He feels hopeless and like he has nothing for which to live.  He is very frustrated about his chronic diarrhea and other GI problems for which he sees a gastroenterologist (Dr. Allen Norris) but has had minimal success.  He continues to have chronic diarrhea.  He denies any acute pain at this time and specifically denies fever/chills, chest pain, shortness of breath, nausea, vomiting or abdominal pain, dysuria.  He states that the depression is severe.  Nothing in particular makes the patient's symptoms better nor worse.     Past Medical History:  Diagnosis Date  . Anginal pain ([place])   . Bipolar disorder ([place])   . Chronic diarrhea   . Depression   . Hypertension   . Hypothyroidism   . Obsessive compulsive disorder   . Sleep apnea    patient states it was "marginal", no CPAP    Patient Active Problem List   Diagnosis Date Noted  . Cough 06/24/2016  . Ankle swelling 06/24/2016  . Special  screening for malignant neoplasms, colon   . Benign neoplasm of sigmoid colon   . Severe recurrent major depression without psychotic features ([place]) 11/10/2015  . Suicidal ideation 11/10/2015  . Allergic rhinitis 11/10/2014  . HLD (hyperlipidemia) 11/10/2014  . HTN (hypertension) 11/10/2014  . Adult hypothyroidism 11/10/2014  . 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/10/2014  . Obstructive apnea 11/10/2014    Past Surgical History:  Procedure Laterality Date  . COLONOSCOPY WITH PROPOFOL N/A 02/05/2016   Procedure: COLONOSCOPY WITH PROPOFOL;  Surgeon: [name], MD;  Location: [place];  Service: Endoscopy;  Laterality: N/A;  . MOUTH SURGERY     orthodontic surgery for underbite  . MYRINGOTOMY WITH TUBE PLACEMENT    . NASAL SEPTUM SURGERY    . POLYPECTOMY N/A 02/05/2016   Procedure: POLYPECTOMY;  Surgeon: [name], MD;  Location: [place];  Service: Endoscopy;  Laterality: N/A;    Prior to Admission medications   Medication Sig Start Date End Date Taking? Authorizing Provider  acetaminophen (TYLENOL) 500 MG tablet Take 500 mg by mouth every 6 (six) hours as needed.    [provider]  aspirin 81 MG tablet Take 81 mg by mouth daily.    [provider]  Eluxadoline (VIBERZI) 100 MG TABS Take 1 tablet (100 mg total) by mouth 2 (two) times daily. 12/27/16   [name], MD  Eluxadoline (VIBERZI) 75 MG TABS Take 75 mg by mouth daily. 12/13/16   Allen Norris,  Darren, MD  fexofenadine (ALLEGRA) 180 MG tablet Take 180 mg by mouth daily.    [provider]  fluticasone (FLONASE) 50 MCG/ACT nasal spray Place 2 sprays into both nostrils daily. Patient not taking: Reported on 11/14/2016 05/14/16 05/14/17  Wilhelmina Mcardle, MD  Fluticasone-Salmeterol (ADVAIR DISKUS) 250-50 MCG/DOSE AEPB Inhale 1 puff into the lungs 2 (two) times daily. Patient not taking: Reported on 11/14/2016 05/14/16 05/14/17  Wilhelmina Mcardle, MD  hydrochlorothiazide (HYDRODIURIL) 25 MG tablet Take 1 tablet (25  mg total) by mouth daily. 01/08/16   Jerrol Banana., MD  ipratropium (ATROVENT) 0.06 % nasal spray Place 2 sprays into the nose 3 (three) times daily. Patient not taking: Reported on 11/14/2016 06/13/16 06/13/17  Flora Lipps, MD  levothyroxine (SYNTHROID, LEVOTHROID) 25 MCG tablet Take 1 tablet (25 mcg total) by mouth daily. 12/10/16 03/10/17  Trinna Post, PA-C  Multiple Vitamin (MULTIVITAMIN) tablet Take 1 tablet by mouth daily.    [provider]  pantoprazole (PROTONIX) 40 MG tablet Take 1 tablet (40 mg total) by mouth daily. Patient not taking: Reported on 11/14/2016 06/13/16 06/13/17  Flora Lipps, MD  potassium chloride SA (KLOR-CON M20) 20 MEQ tablet Take 1 tablet (20 mEq total) by mouth 2 (two) times daily. 12/30/16   Hinda Kehr, MD  rosuvastatin (CRESTOR) 10 MG tablet Take 1 tablet (10 mg total) by mouth daily. 12/04/15   Jerrol Banana., MD  TRINTELLIX 10 MG TABS  09/23/16   [provider]  VIAGRA 50 MG tablet  09/16/16   [provider]  vitamin C (ASCORBIC ACID) 500 MG tablet Take 500 mg by mouth daily.    [provider]    Allergies Amoxicillin; Penicillins; and Sulfa antibiotics  Family History  Problem Relation Age of Onset  . Depression Mother   . Anxiety disorder Mother   . Congestive Heart Failure Father   . Prostate cancer Father   . Hypertension Father     Social History Social History  Substance Use Topics  . Smoking status: Never Smoker  . Smokeless tobacco: Never Used  . Alcohol use Yes     Comment: rarely    Review of Systems Constitutional: No fever/chills Eyes: No visual changes. ENT: No sore throat. Cardiovascular: Denies chest pain. Respiratory: Denies shortness of breath. Gastrointestinal: No abdominal pain.  No nausea, no vomiting.  Chronic diarrhea.  No constipation. Genitourinary: Negative for dysuria. Musculoskeletal: Negative for neck pain.  Negative for back pain. Integumentary: Negative for  rash. Neurological: Negative for headaches, focal weakness or numbness.   ____________________________________________   PHYSICAL EXAM:  VITAL SIGNS: ED Triage Vitals  Enc Vitals Group     BP 12/30/16 1923 (!) 162/102     Pulse Rate 12/30/16 1923 65     Resp 12/30/16 1923 18     Temp 12/30/16 1923 (!) 97.5 F (36.4 C)     Temp Source 12/30/16 1923 Oral     SpO2 12/30/16 1923 100 %     Weight 12/30/16 1923 68 kg (150 lb)     Height 12/30/16 1923 1.702 m (5\' 7" )     Head Circumference --      Peak Flow --      Pain Score 12/30/16 1929 0     Pain Loc --      Pain Edu? --      Excl. in [place]? --     Constitutional: Alert and oriented. Well appearing and in no acute distress. Eyes:  Conjunctivae are normal.  Head: Atraumatic. Nose: No congestion/rhinnorhea. Mouth/Throat: Mucous membranes are moist. Neck: No stridor.  No meningeal signs.   Cardiovascular: Normal rate, regular rhythm. Good peripheral circulation. Grossly normal heart sounds. Respiratory: Normal respiratory effort.  No retractions. Lungs CTAB. Gastrointestinal: Soft and nontender. No distention.  Musculoskeletal: No lower extremity tenderness nor edema. No gross deformities of extremities. Neurologic:  Normal speech and language. No gross focal neurologic deficits are appreciated.  Skin:  Skin is warm, dry and intact. No rash noted. Psychiatric: Mood and affect are calm and cooperative.  Endorses depression with suicidal ideation.  ____________________________________________   LABS (all labs ordered are listed, but only abnormal results are displayed)  Labs Reviewed  COMPREHENSIVE METABOLIC PANEL - Abnormal; Notable for the following:       Result Value   Potassium 2.8 (*)    Chloride 97 (*)    All other components within normal limits  ACETAMINOPHEN LEVEL - Abnormal; Notable for the following:    Acetaminophen (Tylenol), Serum <10 (*)    All other components within normal limits  ETHANOL  SALICYLATE  LEVEL  CBC  URINE DRUG SCREEN, QUALITATIVE ([facility] ONLY)   ____________________________________________  EKG  None - EKG not ordered by ED physician ____________________________________________  RADIOLOGY   No results found.  ____________________________________________   PROCEDURES  Critical Care performed: No   Procedure(s) performed:   Procedures   ____________________________________________   INITIAL IMPRESSION / ASSESSMENT AND PLAN / ED COURSE  Pertinent labs & imaging results that were available during my care of the patient were reviewed by me and considered in my medical decision making (see chart for details).  I am upholding the involuntary commitment and have ordered a specialist on-call psychiatry consultation.  He does not need any emergent medications at this time.  His potassium is low likely due to chronic gastrointestinal losses I have ordered 40 mEq by mouth now as well as a daily supplement.  I have also prescribed daily supplements assuming that he may be discharged if he does not meet inpatient criteria and I put the prescription on his chart.  No acute or emergent medical conditions at this time.   Clinical Course as of Dec 30 2304  Mon Dec 30, 2016  2259 Reviewed written report from [facility].  They recommend inpatient treatment .  I ordered medications (fluoxetine, Zyprexa, and methylphenidate) per their recommendations.  [CF]    Clinical Course User Index [CF] Hinda Kehr, MD    ____________________________________________  FINAL CLINICAL IMPRESSION(S) / ED DIAGNOSES  Final diagnoses:  Depression, unspecified depression type  Suicidal ideation  Chronic diarrhea  Hypokalemia, gastrointestinal losses     MEDICATIONS GIVEN DURING THIS VISIT:  Medications  potassium chloride SA (K-DUR,KLOR-CON) CR tablet 40 mEq (40 mEq Oral Given 12/30/16 2225)  acetaminophen (TYLENOL) tablet 500 mg (not administered)  loratadine (CLARITIN) tablet 10 mg  (not administered)  hydrochlorothiazide (HYDRODIURIL) tablet 25 mg (not administered)  levothyroxine (SYNTHROID, LEVOTHROID) tablet 25 mcg (not administered)  multivitamin tablet 1 tablet (not administered)  rosuvastatin (CRESTOR) tablet 10 mg (not administered)  FLUoxetine (PROZAC) capsule 20 mg (not administered)  OLANZapine (ZYPREXA) tablet 1.25 mg (not administered)  methylphenidate ER tablet 10 mg (not administered)  methylphenidate (METADATE ER) ER tablet 20 mg (not administered)  methylphenidate ER tablet 10 mg (not administered)     NEW OUTPATIENT MEDICATIONS STARTED DURING THIS VISIT:  New Prescriptions   POTASSIUM CHLORIDE SA (KLOR-CON M20) 20 MEQ TABLET    Take 1 tablet (20  mEq total) by mouth 2 (two) times daily.    Modified Medications   No medications on file    Discontinued Medications   POTASSIUM CHLORIDE SA (K-DUR,KLOR-CON) 20 MEQ TABLET    Take 1 tablet (20 mEq total) by mouth daily.     Note:  This document was prepared using Dragon voice recognition software and may include unintentional dictation errors.    Hinda Kehr, MD 12/30/16 2142    Hinda Kehr, MD 12/30/16 2306

## 2016-12-30 NOTE — ED Notes (Signed)
Pt. To BHU from ED ambulatory without difficulty, to room  BHU4. Report from [employer]. Pt. Is alert and oriented, warm and dry in no distress. Pt. Denies SI, HI, and AVH at time of assessment. Pt. Calm and cooperative. Pt states everything just came to a head and was having thoughts of SI and came into the hospital. Pt. Made aware of security cameras and Q15 minute rounds. Pt. Encouraged to let Nursing staff know of any concerns or needs.

## 2016-12-30 NOTE — ED Notes (Signed)

## 2016-12-30 NOTE — ED Notes (Signed)
Patient moved to ED25 for [facility] which is connected and ready.  Secretary notified.

## 2016-12-30 NOTE — ED Triage Notes (Signed)
Pt arrives to ED via [facility] PD under IVC from [place] for suicidal thoughts. IVC paperwork states that pt has SI with plans to "step in front of moving vehicle or wreck vehicle". IVC paperwork reports pt has a history of depression.

## 2016-12-30 NOTE — ED Notes (Signed)
Report received from [facility]. Patient to be transferred to [facility] room 4.

## 2016-12-30 NOTE — ED Notes (Signed)
Patient ambulated to [facility] by this RN with [employer] escort

## 2016-12-30 NOTE — BH Assessment (Signed)
Assessment Note  William Coleman. is an 56 y.o. male. [name] arrived to the ED by way of [place] police from [place].  He reports that he has been feeling "really depressed".  He states that he has been going through this for years.  He states that he has multiple medical concerns.  He reports symptoms of depression.  He reports isolating himself, worrying, and being easily irritated. He denied symptoms of anxiety. He denied having auditory or visual hallucinations. He reports suicidal ideation with a plan to walk in traffic.  He denied homicidal ideation or intent.  He denied the use of alcohol or drugs.  . IVC paperwork reports "Suicidal thoughts; Plans to step in front of moving vehicle or wreck vehicle; history of depression." TTS spoke with Dayo Pheoenix from [place] team [phone]), she reports that [name] is having thoughts of suicide.  She shared that he reports having suicidal thoughts daily, for a while.  She reports that he has a plan to pretend that he has a flat tire on the side of the road, and walk in front of a truck.  She reports that he has been having physical concerns with his bowels and sinuses.  In addition, she shared that [name] has been on dating websites, and he has not been getting any replies and that this has been a trigger for him.  He carries a prior diagnosis of Depressive Disorder, and is being seen at [facility].  He is scheduled for [place] therapy in [facility] tomorrow.  She further reports that [name] has stopped taking his medications due to additional stomach discomfort.  Diagnosis: Depression, SI  Past Medical History:  Past Medical History:  Diagnosis Date  . Anginal pain ([place])   . Bipolar disorder ([place])   . Depression   . Hypertension   . Hypothyroidism   . Obsessive compulsive disorder   . Sleep apnea    patient states it was "marginal", no CPAP    Past Surgical History:  Procedure Laterality Date  . COLONOSCOPY WITH PROPOFOL N/A  02/05/2016   Procedure: COLONOSCOPY WITH PROPOFOL;  Surgeon: [name], MD;  Location: [place];  Service: Endoscopy;  Laterality: N/A;  . MOUTH SURGERY     orthodontic surgery for underbite  . MYRINGOTOMY WITH TUBE PLACEMENT    . NASAL SEPTUM SURGERY    . POLYPECTOMY N/A 02/05/2016   Procedure: POLYPECTOMY;  Surgeon: [name], MD;  Location: [place];  Service: Endoscopy;  Laterality: N/A;    Family History:  Family History  Problem Relation Age of Onset  . Depression Mother   . Anxiety disorder Mother   . Congestive Heart Failure Father   . Prostate cancer Father   . Hypertension Father     Social History:  reports that he has never smoked. He has never used smokeless tobacco. He reports that he drinks alcohol. He reports that he does not use drugs.  Additional Social History:  Alcohol / Drug Use History of alcohol / drug use?: No history of alcohol / drug abuse  CIWA: CIWA-Ar BP: (!) 162/102 Pulse Rate: 65 COWS:    Allergies:  Allergies  Allergen Reactions  . Amoxicillin Hives  . Penicillins   . Sulfa Antibiotics     Home Medications:  (Not in a hospital admission)  OB/GYN Status:  No LMP for male patient.  General Assessment Data Location of Assessment: [facility] ED TTS Assessment: In system Is this a Tele or Face-to-Face  Assessment?: Face-to-Face Is this an Initial Assessment or a Re-assessment for this encounter?: Initial Assessment Marital status: Single Maiden name: n/a Is patient pregnant?: No Pregnancy Status: No Living Arrangements: Alone Can pt return to current living arrangement?: Yes Admission Status: Involuntary Is patient capable of signing voluntary admission?: Yes Referral Source: Psychiatrist Insurance type: Excel Screening Exam ([place]) Medical Exam completed: Yes  Crisis Care Plan Living Arrangements: Alone Legal Guardian: Other: (Self) Name of Psychiatrist: [place] Name of Therapist:  [facility]  Education Status Is patient currently in school?: No Current Grade: n/a Highest grade of school patient has completed: 2 years of college Name of school: [employer] person: n/a  Risk to self with the past 6 months Suicidal Ideation: Yes-Currently Present Has patient been a risk to self within the past 6 months prior to admission? : Yes Suicidal Intent: Yes-Currently Present Has patient had any suicidal intent within the past 6 months prior to admission? : Yes Is patient at risk for suicide?: Yes (Currently in the hospital) Suicidal Plan?: Yes-Currently Present Has patient had any suicidal plan within the past 6 months prior to admission? : Yes Specify Current Suicidal Plan: Walk into traffic Access to Means: No (Currently in the hospital) What has been your use of drugs/alcohol within the last 12 months?: Denied use of drugs, rare use of alcohol Previous Attempts/Gestures: No How many times?: 0 Other Self Harm Risks: denied Triggers for Past Attempts: None known Intentional Self Injurious Behavior: None Family Suicide History: No Recent stressful life event(s): Other (Comment) (Health concerns, lack of relationship) Persecutory voices/beliefs?: No Depression: Yes Depression Symptoms: Feeling worthless/self pity, Feeling angry/irritable Substance abuse history and/or treatment for substance abuse?: No Suicide prevention information given to non-admitted patients: Not applicable  Risk to Others within the past 6 months Homicidal Ideation: No Does patient have any lifetime risk of violence toward others beyond the six months prior to admission? : No Thoughts of Harm to Others: No Current Homicidal Intent: No Current Homicidal Plan: No Access to Homicidal Means: No Identified Victim: None identified History of harm to others?: No Assessment of Violence: None Noted Criminal Charges Pending?: No Does patient have a court date: No Is patient on probation?:  No  Psychosis Hallucinations: None noted Delusions: None noted  Mental Status Report Appearance/Hygiene: In scrubs Eye Contact: Fair Motor Activity: Unremarkable Speech: Logical/coherent Level of Consciousness: Alert Mood: Depressed Affect: Flat Anxiety Level: None Thought Processes: Coherent Judgement: Partial Orientation: Time, Place, Person, Situation Obsessive Compulsive Thoughts/Behaviors: None  Cognitive Functioning Concentration: Normal Memory: Recent Intact IQ: Average Insight: Fair Impulse Control: Good Appetite: Fair Sleep: No Change Vegetative Symptoms: None  ADLScreening [facility] Assessment Services) Patient's cognitive ability adequate to safely complete daily activities?: Yes Patient able to express need for assistance with ADLs?: Yes Independently performs ADLs?: Yes (appropriate for developmental age)  Prior Inpatient Therapy Prior Inpatient Therapy: Yes Prior Therapy Dates: June 2017 Prior Therapy Facilty/Provider(s): [facility] Reason for Treatment: Depression, SI  Prior Outpatient Therapy Prior Outpatient Therapy: Yes Prior Therapy Dates: Current Prior Therapy Facilty/Provider(s): [facility] Reason for Treatment: Depression Does patient have an ACCT team?: No Does patient have Intensive In-House Services?  : No Does patient have [facility] services? : No Does patient have P4CC services?: No  ADL Screening (condition at time of admission) Patient's cognitive ability adequate to safely complete daily activities?: Yes Is the patient deaf or have difficulty hearing?: Yes Does the patient have difficulty seeing, even when wearing glasses/contacts?: No Does the patient  have difficulty concentrating, remembering, or making decisions?: No Patient able to express need for assistance with ADLs?: Yes Does the patient have difficulty dressing or bathing?: No Independently performs ADLs?: Yes (appropriate for developmental age) Does the patient have difficulty walking or  climbing stairs?: No Weakness of Legs: None Weakness of Arms/Hands: None  Home Assistive Devices/Equipment Home Assistive Devices/Equipment: None    Abuse/Neglect Assessment (Assessment to be complete while patient is alone) Physical Abuse: Denies Verbal Abuse: Denies Sexual Abuse: Denies Exploitation of patient/patient's resources: Denies Self-Neglect: Denies     Regulatory affairs officer (For Healthcare) Does Patient Have a Medical Advance Directive?: No    Additional Information 1:1 In Past 12 Months?: No CIRT Risk: No Elopement Risk: No Does patient have medical clearance?: Yes     Disposition:  Disposition Initial Assessment Completed for this Encounter: Yes Disposition of Patient: Other dispositions  On Site Evaluation by:   Reviewed with Physician:    Elmer Bales 12/30/2016 8:31 PM

## 2016-12-30 NOTE — ED Notes (Signed)

## 2016-12-31 ENCOUNTER — Encounter: Payer: Self-pay | Admitting: *Deleted

## 2016-12-31 ENCOUNTER — Inpatient Hospital Stay
Admission: EM | Admit: 2016-12-31 | Discharge: 2017-01-07 | DRG: 885 | Disposition: A | Discharge status: Home / Self Care | Payer: BLUE CROSS/BLUE SHIELD | Attending: Psychiatry | Admitting: Psychiatry

## 2016-12-31 DIAGNOSIS — Z79899 Other long term (current) drug therapy: Secondary | ICD-10-CM

## 2016-12-31 DIAGNOSIS — G4710 Hypersomnia, unspecified: Secondary | ICD-10-CM | POA: Diagnosis not present

## 2016-12-31 DIAGNOSIS — E785 Hyperlipidemia, unspecified: Secondary | ICD-10-CM | POA: Diagnosis not present

## 2016-12-31 DIAGNOSIS — G4730 Sleep apnea, unspecified: Secondary | ICD-10-CM | POA: Diagnosis present

## 2016-12-31 DIAGNOSIS — E039 Hypothyroidism, unspecified: Secondary | ICD-10-CM | POA: Diagnosis present

## 2016-12-31 DIAGNOSIS — F332 Major depressive disorder, recurrent severe without psychotic features: Principal | ICD-10-CM | POA: Diagnosis present

## 2016-12-31 DIAGNOSIS — F329 Major depressive disorder, single episode, unspecified: Secondary | ICD-10-CM | POA: Diagnosis not present

## 2016-12-31 DIAGNOSIS — Z7982 Long term (current) use of aspirin: Secondary | ICD-10-CM

## 2016-12-31 DIAGNOSIS — I10 Essential (primary) hypertension: Secondary | ICD-10-CM | POA: Diagnosis not present

## 2016-12-31 DIAGNOSIS — F429 Obsessive-compulsive disorder, unspecified: Secondary | ICD-10-CM | POA: Diagnosis not present

## 2016-12-31 DIAGNOSIS — R197 Diarrhea, unspecified: Secondary | ICD-10-CM | POA: Diagnosis not present

## 2016-12-31 DIAGNOSIS — K529 Noninfective gastroenteritis and colitis, unspecified: Secondary | ICD-10-CM | POA: Diagnosis not present

## 2016-12-31 DIAGNOSIS — K580 Irritable bowel syndrome with diarrhea: Secondary | ICD-10-CM | POA: Diagnosis not present

## 2016-12-31 DIAGNOSIS — F419 Anxiety disorder, unspecified: Secondary | ICD-10-CM | POA: Diagnosis present

## 2016-12-31 DIAGNOSIS — R45851 Suicidal ideations: Secondary | ICD-10-CM | POA: Diagnosis not present

## 2016-12-31 DIAGNOSIS — K589 Irritable bowel syndrome without diarrhea: Secondary | ICD-10-CM | POA: Diagnosis present

## 2016-12-31 DIAGNOSIS — Z01810 Encounter for preprocedural cardiovascular examination: Secondary | ICD-10-CM | POA: Diagnosis not present

## 2016-12-31 DIAGNOSIS — E876 Hypokalemia: Secondary | ICD-10-CM | POA: Diagnosis present

## 2016-12-31 MED ORDER — ALUM & MAG HYDROXIDE-SIMETH 200-200-20 MG/5ML PO SUSP: 30.0000 mL | ORAL | Status: DC | PRN

## 2016-12-31 MED ORDER — HYDROCHLOROTHIAZIDE 25 MG PO TABS
25.0000 mg | ORAL_TABLET | Freq: Every day | ORAL | Status: DC
  Administered 2017-01-01 – 2017-01-02 (×2): 25 mg via ORAL
  Filled 2016-12-31 (×2): qty 1

## 2016-12-31 MED ORDER — LEVOTHYROXINE SODIUM 25 MCG PO TABS
ORAL_TABLET | ORAL | Status: DC
Start: 2016-12-31 — End: 2016-12-31
  Filled 2016-12-31: qty 1

## 2016-12-31 MED ORDER — METHYLPHENIDATE HCL 10 MG PO TABS
10.0000 mg | ORAL_TABLET | Freq: Every day | ORAL | Status: DC
  Administered 2016-12-31: 10 mg via ORAL
  Filled 2016-12-31: qty 1

## 2016-12-31 MED ORDER — MAGNESIUM HYDROXIDE 400 MG/5ML PO SUSP: 30.0000 mL | Freq: Every day | ORAL | Status: DC | PRN

## 2016-12-31 MED ORDER — ACETAMINOPHEN 325 MG PO TABS
650.0000 mg | ORAL_TABLET | Freq: Four times a day (QID) | ORAL | Status: DC | PRN
  Administered 2017-01-01 – 2017-01-03 (×2): 650 mg via ORAL
  Filled 2016-12-31 (×2): qty 2

## 2016-12-31 MED ORDER — ADULT MULTIVITAMIN W/MINERALS CH
1.0000 | ORAL_TABLET | Freq: Every day | ORAL | Status: DC
  Administered 2017-01-01 – 2017-01-07 (×7): 1 via ORAL
  Filled 2016-12-31 (×7): qty 1

## 2016-12-31 MED ORDER — ASPIRIN EC 81 MG PO TBEC
81.0000 mg | DELAYED_RELEASE_TABLET | Freq: Every day | ORAL | Status: DC
  Administered 2017-01-01 – 2017-01-07 (×7): 81 mg via ORAL
  Filled 2016-12-31 (×7): qty 1

## 2016-12-31 MED ORDER — METHYLPHENIDATE HCL 10 MG PO TABS
20.0000 mg | ORAL_TABLET | Freq: Every day | ORAL | Status: DC
  Administered 2016-12-31: 20 mg via ORAL
  Filled 2016-12-31: qty 2

## 2016-12-31 MED ORDER — POTASSIUM CHLORIDE CRYS ER 20 MEQ PO TBCR
40.0000 meq | EXTENDED_RELEASE_TABLET | Freq: Every day | ORAL | Status: DC
  Administered 2017-01-01 – 2017-01-02 (×2): 40 meq via ORAL
  Filled 2016-12-31 (×2): qty 2

## 2016-12-31 MED ORDER — LEVOTHYROXINE SODIUM 50 MCG PO TABS
25.0000 ug | ORAL_TABLET | Freq: Every day | ORAL | Status: DC
  Administered 2017-01-01 – 2017-01-07 (×7): 25 ug via ORAL
  Filled 2016-12-31 (×7): qty 1

## 2016-12-31 MED ORDER — LORATADINE 10 MG PO TABS
10.0000 mg | ORAL_TABLET | Freq: Every day | ORAL | Status: DC
  Administered 2017-01-01 – 2017-01-07 (×7): 10 mg via ORAL
  Filled 2016-12-31 (×8): qty 1

## 2016-12-31 MED ORDER — ROSUVASTATIN CALCIUM 20 MG PO TABS
10.0000 mg | ORAL_TABLET | Freq: Every day | ORAL | Status: DC
  Administered 2017-01-01 – 2017-01-02 (×2): 10 mg via ORAL
  Filled 2016-12-31 (×2): qty 1

## 2016-12-31 NOTE — ED Notes (Signed)
Patient alert and oriented sitting in dayroom, seen interacting with peer at times. Pt is calm,cooperative and mildly anxious. Pt denies suicidal ideations, but endorses somatic complaints, predominately GI concerns (IBS). Pt concerned about medications and their effect on his bowels. Pt made aware of upcoming move to lower level and is agreeable about the transfer. Pt remains safe with 15 minute checks.

## 2016-12-31 NOTE — ED Provider Notes (Signed)
-----------------------------------------   6:50 AM on 12/31/2016 -----------------------------------------   Blood pressure 126/90, pulse 74, temperature 98.2 F (36.8 C), temperature source Oral, resp. rate 18, height 5\' 7"  (1.702 m), weight 68 kg (150 lb), SpO2 99 %.  The patient had no acute events since last update.  Calm and cooperative at this time.  Disposition is pending Psychiatry/Behavioral Medicine team recommendations.     Paulette Blanch, MD 12/31/16 [phone]

## 2016-12-31 NOTE — ED Notes (Signed)
Patient states he has an appointment tomorrow with [facility] in [place] and he will not be able to keep it due to his stay at [facility].  Patient requests we call them in the morning and cancel this appointment for him.  They open at 8:30am so I will pass this along to the next nurse during report.

## 2016-12-31 NOTE — ED Notes (Signed)
Called [facility] and spoke to [place]- cancelled pt's morning appointment

## 2016-12-31 NOTE — BH Assessment (Signed)
Patient seen by [facility] and recommends Inpatient Hospitalization.  Informed [facility] BMU Attending Physician, Dr. Jerilee Hoh and she will enter admission orders. Attending Physician will be Dr. Bary Leriche.   Patient has been assigned to room 315, by [facility] Charge Nurse Jake Church.   Intake Paper Work has been signed and placed on patient chart.  ER staff is aware of the admission Lorenda Ishihara, ER Sect.; Dr. Quentin Cornwall, ER MD; Mateo Flow, [place], Patient Access).

## 2016-12-31 NOTE — Tx Team (Signed)
Initial Treatment Plan 12/31/2016 4:51 PM William Jumbo. TVI:[id number]    PATIENT STRESSORS: Health problems Other: loniliness   PATIENT STRENGTHS: Ability for insight Communication skills General fund of knowledge Motivation for treatment/growth   PATIENT IDENTIFIED PROBLEMS: Depressive Disorder  Suicidal Ideation                   DISCHARGE CRITERIA:  Improved stabilization in mood, thinking, and/or behavior  PRELIMINARY DISCHARGE PLAN: Outpatient therapy  PATIENT/FAMILY INVOLVEMENT: This treatment plan has been presented to and reviewed with the patient, William Jumbo., and/or family member, .  The patient and family have been given the opportunity to ask questions and make suggestions.  William Parkins, RN 12/31/2016, 4:51 PM

## 2016-12-31 NOTE — ED Notes (Signed)
Patient sitting in dayroom interacting with peer. No behavioral issues. Pt remains safe with 15 minute checks

## 2016-12-31 NOTE — ED Notes (Signed)
Information given to Joelene Millin, RN so patient can cancel his appointment in the morning:  [facility], Phone: [phone]

## 2016-12-31 NOTE — ED Notes (Signed)
Patient transferred under IVC to lower level, BMU, accompanied by sheriff with all belongings. Pt stable at this time. Pt currently denies SI/HI and A/V hallucinations.

## 2016-12-31 NOTE — ED Notes (Signed)
Patient requested shower- given new scrubs, underwear, socks, toiletries and towel and is in shower

## 2016-12-31 NOTE — ED Notes (Signed)
Breakfast brought to patient 

## 2016-12-31 NOTE — ED Notes (Addendum)
Verified pt's Ritalin dosage with ED doctor after speaking with pharmacist about dosage concern

## 2016-12-31 NOTE — Progress Notes (Signed)
Patient admitted to unit for depression and SI. Pt reports suicidal due to IBS, Sinus problems and loneliness. Pt reports he has been on web sites trying to find a girlfriend and no one will contact him back. Pt reports its been 5 years since he's had a girlfriend. Patient reports his biggest stressors is in IBS and having to use the bathroom so much. Pt denies SI, HI, and AVH and verbally contracts for safety. Admission assessment completed. Skin and contraband search completed and witnessed by Heath Lark, Therapist, sports. No skin issues noted, no contraband found. Oriented patient to room and unit. Fluid and nutrition offered. Pt currently in dayroom with peers. Remains safe on unit with q15 min checks.

## 2016-12-31 NOTE — ED Notes (Signed)
Report given to Marcie Bal, RN -BMU-

## 2017-01-01 ENCOUNTER — Encounter: Payer: Self-pay | Admitting: Psychiatry

## 2017-01-01 DIAGNOSIS — F332 Major depressive disorder, recurrent severe without psychotic features: Principal | ICD-10-CM

## 2017-01-01 DIAGNOSIS — F429 Obsessive-compulsive disorder, unspecified: Secondary | ICD-10-CM

## 2017-01-01 LAB — BASIC METABOLIC PANEL
Anion gap: 12 (ref 5–15)
BUN: 17 mg/dL (ref 6–20)
CALCIUM: 10.1 mg/dL (ref 8.9–10.3)
CO2: 30 mmol/L (ref 22–32)
Chloride: 96 mmol/L — ABNORMAL LOW (ref 101–111)
Creatinine, Ser: 1.01 mg/dL (ref 0.61–1.24)
GFR calc Af Amer: >60 mL/min (ref >60)
GLUCOSE: 135 mg/dL — AB (ref 65–99)
Potassium: 3 mmol/L — ABNORMAL LOW (ref 3.5–5.1)
Sodium: 138 mmol/L (ref 135–145)

## 2017-01-01 LAB — VITAMIN B12: VITAMIN B 12: 790 pg/mL (ref 180–914)

## 2017-01-01 LAB — TSH: TSH: 0.432 u[IU]/mL (ref 0.350–4.500)

## 2017-01-01 MED ORDER — NORTRIPTYLINE HCL 25 MG PO CAPS
50.0000 mg | ORAL_CAPSULE | Freq: Every day | ORAL | Status: DC
  Administered 2017-01-01 – 2017-01-03 (×3): 50 mg via ORAL
  Filled 2017-01-01 (×4): qty 2

## 2017-01-01 NOTE — Progress Notes (Signed)
D: Pt denies SI/HI/AVH. Pt is pleasant and cooperative. Patient's thoughts are organized he appears less anxious and he is interacting with peers and staff appropriately.  A: Pt was offered support and encouragement. Pt was offered scheduled medications. Pt was encouraged to attend groups. Q 15 minute checks were done for safety.  R:Pt attends groups and interacts well with peers and staff. Pt is taking medication. Pt has no complaints.Pt receptive to treatment and safety maintained on unit.

## 2017-01-01 NOTE — BHH Group Notes (Signed)
[facility] Group Notes:  (Nursing/MHT/Case Management/Adjunct)  Date:  01/01/2017  Time:  9:35 PM  Type of Therapy:  Group Therapy  Participation Level:  Active  Participation Quality:  Appropriate  Affect:  Appropriate  Cognitive:  Appropriate  Insight:  Appropriate  Engagement in Group:  Engaged  Modes of Intervention:  Support  Summary of Progress/Problems:  William Coleman 01/01/2017, 9:35 PM

## 2017-01-01 NOTE — Progress Notes (Signed)
Rates depression as 7/10, anxiety as 2/10 and hopelessness as 5/10.  Affect flat.  Denies SI this shift.  Visible in the milieu.  Medication and group compliant.  Support offerd.  Safety checks maintained,.

## 2017-01-01 NOTE — BHH Group Notes (Signed)
[facility] LCSW Group Therapy   01/01/2017  9:30 am   Type of Therapy: Group Therapy   Participation Level: Active   Participation Quality: Attentive, Sharing and Supportive   Affect: Appropriate   Cognitive: Alert and Oriented   Insight: Developing/Improving and Engaged   Engagement in Therapy: Developing/Improving and Engaged   Modes of Intervention: Clarification, Confrontation, Discussion, Education, Exploration, Limit-setting, Orientation, Problem-solving, Rapport Building, Art therapist, Socialization and Support   Summary of Progress/Problems: The topic for group today was emotional regulation. This group focused on both positive and negative emotion identification and allowed  group members to process ways to identify feelings, regulate negative emotions, and find healthy ways to manage internal/external emotions. Group members were asked to reflect on a time when their reaction to an emotion led to a negative outcome and explored how alternative responses using emotion regulation would have benefited them. Group members were also asked to discuss a time when emotion regulation was utilized when a negative emotion was experienced. Patient defined what emotion regulation meant to him. Patient identified emotions that he felt before being admitted to the hospital and the behavior that came with it. He also identified coping mechanisms or opposite actions in order to regulate those negative emotions.    Glorious Peach, MSW, LCSW-A 01/01/2017, 10:43AM

## 2017-01-01 NOTE — BHH Suicide Risk Assessment (Signed)
[facility] Admission Suicide Risk Assessment   Nursing information obtained from:    Demographic factors:    Current Mental Status:    Loss Factors:    Historical Factors:    Risk Reduction Factors:     Total Time spent with patient: 1 hour Principal Problem: Severe recurrent major depression without psychotic features [facility]) Diagnosis:   Patient Active Problem List   Diagnosis Date Noted  . OCD (obsessive compulsive disorder) [F42.9] 01/01/2017  . 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 Severe recurrent major depression without psychotic features ([place]) [F33.2] 11/10/2015  . HLD (hyperlipidemia) [E78.5] 11/10/2014  . HTN (hypertension) [I10] 11/10/2014  . Adult hypothyroidism [E03.9] 11/10/2014  . 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  World Pharmacologist [facility]). Score between 0-7:  no or low risk or alcohol related problems. Score between 8-15:  moderate risk of alcohol related problems. Score between 16-19:  high risk of alcohol related problems. Score 20 or above:  warrants further diagnostic evaluation for alcohol dependence and treatment.   CLINICAL FACTORS:   Depression:   Severe Previous Psychiatric Diagnoses and Treatments   Musculoskeletal: Strength & Muscle Tone: within normal limits Gait & Station: normal Patient leans: N/A  Psychiatric Specialty Exam: Physical Exam  Constitutional: He is oriented to person, place, and time. He appears well-developed and well-nourished.  HENT:  Head: Normocephalic and atraumatic.  Eyes: Conjunctivae and EOM are normal.  Respiratory: Effort normal.  Musculoskeletal: Normal range of motion.  Neurological: He is alert and oriented to person, place, and time.    Review of Systems  Constitutional: Negative.    HENT: Negative.   Eyes: Negative.   Respiratory: Negative.   Cardiovascular: Negative.   Gastrointestinal: Positive for diarrhea.  Genitourinary: Negative.   Musculoskeletal: Negative.   Skin: Negative.   Neurological: Negative.   Endo/Heme/Allergies: Negative.   Psychiatric/Behavioral: Positive for depression and suicidal ideas. Negative for hallucinations, memory loss and substance abuse. The patient is nervous/anxious. The patient does not have insomnia.     Blood pressure 136/90, pulse 66, temperature 98.1 F (36.7 C), temperature source Oral, resp. rate 18, height 5\' 7"  (1.702 m), weight 68 kg (150 lb), SpO2 100 %.Body mass index is 23.49 kg/m.                                                    Sleep:  Number of Hours: 6.75      COGNITIVE FEATURES THAT CONTRIBUTE TO RISK:  Closed-mindedness    SUICIDE RISK:   Moderate:  Frequent suicidal ideation with limited intensity, and duration, some specificity in terms of plans, no associated intent, good self-control, limited dysphoria/symptomatology, some risk factors present, and identifiable protective factors, including available and accessible social support.  PLAN OF CARE: admit  I certify that inpatient services furnished can reasonably be expected to improve the patient's condition.   Hildred Priest, MD 01/01/2017, 1:18 PM

## 2017-01-01 NOTE — Tx Team (Signed)
Interdisciplinary Treatment and Diagnostic Plan Update  01/01/2017 Time of Session: William Coleman. MRN: [id number]  Principal Diagnosis: Severe recurrent major depression without psychotic features (William Coleman)  Secondary Diagnoses: Principal Problem:   Severe recurrent major depression without psychotic features (William Coleman) Active Problems:   HLD (hyperlipidemia)   HTN (hypertension)   Adult hypothyroidism   Adaptive colitis   OCD (obsessive compulsive disorder)   Current Medications:  Current Facility-Administered Medications  Medication Dose Route Frequency Provider Last Rate Last Dose  . acetaminophen (TYLENOL) tablet 650 mg  650 mg Oral Q6H PRN Hildred Priest, MD   650 mg at 01/01/17 0855  . alum & mag hydroxide-simeth (MAALOX/MYLANTA) 200-200-20 MG/5ML suspension 30 mL  30 mL Oral Q4H PRN Hildred Priest, MD      . aspirin EC tablet 81 mg  81 mg Oral Daily Hildred Priest, MD   81 mg at 01/01/17 0854  . hydrochlorothiazide (HYDRODIURIL) tablet 25 mg  25 mg Oral Daily Hildred Priest, MD   25 mg at 01/01/17 0853  . levothyroxine (SYNTHROID, LEVOTHROID) tablet 25 mcg  25 mcg Oral QAC breakfast Hildred Priest, MD   25 mcg at 01/01/17 [phone]  . loratadine (CLARITIN) tablet 10 mg  10 mg Oral Daily Hildred Priest, MD   10 mg at 01/01/17 0854  . magnesium hydroxide (MILK OF MAGNESIA) suspension 30 mL  30 mL Oral Daily PRN Hildred Priest, MD      . multivitamin with minerals tablet 1 tablet  1 tablet Oral Daily Hildred Priest, MD   1 tablet at 01/01/17 0854  . nortriptyline (PAMELOR) capsule 50 mg  50 mg Oral QHS Hernandez-Gonzalez, Seth Bake, MD      . potassium chloride SA (K-DUR,KLOR-CON) CR tablet 40 mEq  40 mEq Oral Daily Hildred Priest, MD   40 mEq at 01/01/17 0853  . rosuvastatin (CRESTOR) tablet 10 mg  10 mg Oral Daily Hildred Priest, MD   10 mg at 01/01/17 7654   PTA  Medications: Prescriptions Prior to Admission  Medication Sig Dispense Refill Last Dose  . acetaminophen (TYLENOL) 500 MG tablet Take 500 mg by mouth every 6 (six) hours as needed for mild pain or moderate pain.    PRN at PRN  . aspirin 81 MG tablet Take 81 mg by mouth daily.   unknown at unknown  . Eluxadoline (VIBERZI) 75 MG TABS Take 75 mg by mouth daily. 30 tablet 1 unknown at unknown  . fexofenadine (ALLEGRA) 180 MG tablet Take 180 mg by mouth daily.   unknown at unknown  . hydrochlorothiazide (HYDRODIURIL) 25 MG tablet Take 1 tablet (25 mg total) by mouth daily. 30 tablet 12 unknown at unknown  . levothyroxine (SYNTHROID, LEVOTHROID) 25 MCG tablet Take 1 tablet (25 mcg total) by mouth daily. 90 tablet 0 unknown at unknown  . Multiple Vitamin (MULTIVITAMIN) tablet Take 1 tablet by mouth daily.   unknown at unknown  . potassium chloride SA (KLOR-CON M20) 20 MEQ tablet Take 1 tablet (20 mEq total) by mouth 2 (two) times daily. 28 tablet 0 unknown at unknown  . rosuvastatin (CRESTOR) 10 MG tablet Take 1 tablet (10 mg total) by mouth daily. 30 tablet 11 unknown at unknown  . VIAGRA 50 MG tablet Take 50 mg by mouth as needed for erectile dysfunction.   1 PRN at PRN  . vitamin C (ASCORBIC ACID) 500 MG tablet Take 500 mg by mouth daily.   unknown at unknown    Patient Stressors: Health problems Other: loniliness  Patient Strengths: Ability for insight Curator fund of knowledge Motivation for treatment/growth  Treatment Modalities: Medication Management, Group therapy, Case management,  1 to 1 session with clinician, Psychoeducation, Recreational therapy.   Physician Treatment Plan for Primary Diagnosis: Severe recurrent major depression without psychotic features (William Coleman) Long Term Goal(s):     Short Term Goals:    Medication Management: Evaluate patient's response, side effects, and tolerance of medication regimen.  Therapeutic Interventions: 1 to 1 sessions, Unit  Group sessions and Medication administration.  Evaluation of Outcomes: Not Met  Physician Treatment Plan for Secondary Diagnosis: Principal Problem:   Severe recurrent major depression without psychotic features (William Coleman) Active Problems:   HLD (hyperlipidemia)   HTN (hypertension)   Adult hypothyroidism   Adaptive colitis   OCD (obsessive compulsive disorder)  Long Term Goal(s):     Short Term Goals:       Medication Management: Evaluate patient's response, side effects, and tolerance of medication regimen.  Therapeutic Interventions: 1 to 1 sessions, Unit Group sessions and Medication administration.  Evaluation of Outcomes: Not Met   RN Treatment Plan for Primary Diagnosis: Severe recurrent major depression without psychotic features (William Coleman) Long Term Goal(s): Knowledge of disease and therapeutic regimen to maintain health will improve  Short Term Goals: Ability to verbalize feelings will improve, Ability to disclose and discuss suicidal ideas, Ability to identify and develop effective coping behaviors will improve and Compliance with prescribed medications will improve  Medication Management: RN will administer medications as ordered by provider, will assess and evaluate patient's response and provide education to patient for prescribed medication. RN will report any adverse and/or side effects to prescribing provider.  Therapeutic Interventions: 1 on 1 counseling sessions, Psychoeducation, Medication administration, Evaluate responses to treatment, Monitor vital signs and CBGs as ordered, Perform/monitor CIWA, COWS, AIMS and Fall Risk screenings as ordered, Perform wound care treatments as ordered.  Evaluation of Outcomes: Not Met   LCSW Treatment Plan for Primary Diagnosis: Severe recurrent major depression without psychotic features (William Coleman) Long Term Goal(s): Safe transition to appropriate next level of care at discharge, Engage patient in therapeutic group addressing  interpersonal concerns.  Short Term Goals: Engage patient in aftercare planning with referrals and resources, Increase social support and Increase skills for wellness and recovery  Therapeutic Interventions: Assess for all discharge needs, 1 to 1 time with Social worker, Explore available resources and support systems, Assess for adequacy in community support network, Educate family and significant other(s) on suicide prevention, Complete Psychosocial Assessment, Interpersonal group therapy.  Evaluation of Outcomes: Not Met   Progress in Treatment: Attending groups: Yes. Participating in groups: Yes. Taking medication as prescribed: Yes. Toleration medication: Yes. Family/Significant other contact made: No, will contact:  sister Patient understands diagnosis: Yes. Discussing patient identified problems/goals with staff: Yes. Medical problems stabilized or resolved: Yes. Denies suicidal/homicidal ideation: Yes. Issues/concerns per patient self-inventory: No. Other: none  New problem(s) identified: No, Describe:  none  New Short Term/Long Term Goal(s): Pt goal is "can you make these problems go away?"  Discharge Plan or Barriers: Pt will return to outpt services at [facility].  Reason for Continuation of Hospitalization: Depression Medication stabilization  Estimated Length of Stay: 5-7 days. Attendees: Patient: William Coleman 01/01/2017   Physician: Dr. Jerilee Hoh, MD 01/01/2017  Nursing:  01/01/2017   RN Care Manager: 01/01/2017   Social Worker: Lurline Idol, LCSW 01/01/2017   Recreational Therapist:  01/01/2017   Other: Andree Coss, PA Student 01/01/2017   Other:  01/01/2017   Other:  01/01/2017         Scribe for Treatment Team: Joanne Chars, LCSW 01/01/2017 1:33 PM

## 2017-01-01 NOTE — Plan of Care (Signed)
Problem: Medication: Goal: Compliance with prescribed medication regimen will improve Outcome: Progressing Medication compliant   

## 2017-01-01 NOTE — H&P (Signed)
Psychiatric Admission Assessment Adult  Patient Identification: William Coleman. MRN:  [id number] Date of Evaluation:  01/01/2017 Chief Complaint:  depression Principal Diagnosis: Severe recurrent major depression without psychotic features [facility]) Diagnosis:   Patient Active Problem List   Diagnosis Date Noted  . OCD (obsessive compulsive disorder) [F42.9] 01/01/2017  . 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 Severe recurrent major depression without psychotic features ([place]) [F33.2] 11/10/2015  . HLD (hyperlipidemia) [E78.5] 11/10/2014  . HTN (hypertension) [I10] 11/10/2014  . Adult hypothyroidism [E03.9] 11/10/2014  . Adaptive colitis [K58.9] 11/10/2014   History of Present Illness:   Patient is a 56 year old single Caucasian male with a history of depression, OCD, and IBS. Patient arrived to our emergency department on July 23 on the involuntary commitment from [place] due to suicidal ideation. IVC paperwork stated that pt has SI with plans to "step in front of moving vehicle or wreck vehicle".  Patient reports having a long history of depression and also GI problems. Patient is currently been follow-up by [facility] for depression and by GI for chronic diarrhea. The patient is states that he has been tried on multiple medications for depression but they've worsened his gastrointestinal issues. He has been without any medications for at least 2 months. At this time he is not taking any medications and has been referred for transcranial magnetic stimulation.   Patient says that he always has thoughts of suicide but they have worsened lately as his diarrhea has exacerbated. He also feels very lonely as he has not been in any romantic relationships are more than 4 years. Patient says that he has been trying dating websites but he has not had any success.  During assessment the patient was still depression, suicidality and hopelessness.  Patient denies history of prior suicidal attempts. He  was hospitalized here about a year ago due to depression and suicidality. He has been diagnosed with OCD. He says that the OCD symptoms have decreased over the years. He reported some compulsive checking.  Trauma history denies  Substance abuse history denies  Associated Signs/Symptoms: Depression Symptoms:  depressed mood, hypersomnia, fatigue, feelings of worthlessness/guilt, recurrent thoughts of death, (Hypo) Manic Symptoms:  Distractibility, Anxiety Symptoms:  Excessive Worry, Psychotic Symptoms:  denies PTSD Symptoms: Had a traumatic exposure:  denies Total Time spent with patient: 1 hour  Past Psychiatric History: One prior psychiatric hospitalization in our facility a year ago for depression and suicidality. He has been tried on a multitude of SSRIs along with clomipramine all cause an exacerbation of GI symptoms. Patient denies history of suicidal attempts  Is the patient at risk to self? Yes.    Has the patient been a risk to self in the past 6 months? No.  Has the patient been a risk to self within the distant past? No.  Is the patient a risk to others? No.  Has the patient been a risk to others in the past 6 months? No.  Has the patient been a risk to others within the distant past? No.    Alcohol Screening: 1. How often do you have a drink containing alcohol?: Monthly or less 2. How many drinks containing alcohol do you have on a typical day when you are drinking?: 1 or 2 3. How often do you have six or more drinks on one occasion?: Never Preliminary Score: 0 9. Have you or someone else been injured as a result of your drinking?: No 10. Has a relative or friend or a  doctor or another health worker been concerned about your drinking or suggested you cut down?: No Alcohol Use Disorder Identification Test Final Score (AUDIT): 1 Brief Intervention: AUDIT score less than 7 or less-screening does not suggest unhealthy drinking-brief intervention not indicated  Past  Medical History: F/u by Dr. Allen Norris for chronic diarrhea. On viberzi 100 mg po bid Past Medical History:  Diagnosis Date  . Anginal pain ([place])   . Bipolar disorder ([place])   . Chronic diarrhea   . Depression   . Hypertension   . Hypothyroidism   . Obsessive compulsive disorder   . Sleep apnea    patient states it was "marginal", no CPAP    Past Surgical History:  Procedure Laterality Date  . COLONOSCOPY WITH PROPOFOL N/A 02/05/2016   Procedure: COLONOSCOPY WITH PROPOFOL;  Surgeon: [name], MD;  Location: [facility];  Service: Endoscopy;  Laterality: N/A;  . MOUTH SURGERY     orthodontic surgery for underbite  . MYRINGOTOMY WITH TUBE PLACEMENT    . NASAL SEPTUM SURGERY    . POLYPECTOMY N/A 02/05/2016   Procedure: POLYPECTOMY;  Surgeon: [name], MD;  Location: [facility];  Service: Endoscopy;  Laterality: N/A;   Family History:  Family History  Problem Relation Age of Onset  . Depression Mother   . Anxiety disorder Mother   . Congestive Heart Failure Father   . Prostate cancer Father   . Hypertension Father    Family Psychiatric  History: Patient reports that his mother suffered from anxiety. There is no history of substance abuse or suicide  Tobacco Screening: Have you used any form of tobacco in the last 30 days? (Cigarettes, Smokeless Tobacco, Cigars, and/or Pipes): No   Social History: Patient lives alone. He is single, never married doesn't have any children. He is working part-time in [employer] he has held this job for the last 3 years. History  Alcohol Use  . Yes    Comment: rarely     History  Drug Use No    Additional Social History: Marital status: Single Are you sexually active?: No What is your sexual orientation?: Heterosexual  Has your sexual activity been affected by drugs, alcohol, medication, or emotional stress?: No Does patient have children?: No     Allergies:   Allergies  Allergen Reactions  . Amoxicillin Hives  .  Penicillins   . Sulfa Antibiotics    Lab Results:  Results for orders placed or performed during the hospital encounter of 12/30/16 (from the past 48 hour(s))  Comprehensive metabolic panel     Status: Abnormal   Collection Time: 12/30/16  7:23 PM  Result Value Ref Range   Sodium 138 135 - 145 mmol/L   Potassium 2.8 (L) 3.5 - 5.1 mmol/L   Chloride 97 (L) 101 - 111 mmol/L   CO2 31 22 - 32 mmol/L   Glucose, Bld 90 65 - 99 mg/dL   BUN 17 6 - 20 mg/dL   Creatinine, Ser 1.18 0.61 - 1.24 mg/dL   Calcium 9.6 8.9 - 10.3 mg/dL   Total Protein 7.9 6.5 - 8.1 g/dL   Albumin 4.8 3.5 - 5.0 g/dL   AST 36 15 - 41 U/L   ALT 57 17 - 63 U/L   Alkaline Phosphatase 57 38 - 126 U/L   Total Bilirubin 1.2 0.3 - 1.2 mg/dL   GFR calc non Af Amer >60 >60 mL/min   GFR calc Af Amer >60 >60 mL/min    Comment: (  NOTE) The eGFR has been calculated using the CKD EPI equation. This calculation has not been validated in all clinical situations. eGFR's persistently <60 mL/min signify possible Chronic Kidney Disease.    Anion gap 10 5 - 15  Ethanol     Status: None   Collection Time: 12/30/16  7:23 PM  Result Value Ref Range   Alcohol, Ethyl (B) <5 <5 mg/dL    Comment:        LOWEST DETECTABLE LIMIT FOR SERUM ALCOHOL IS 5 mg/dL FOR MEDICAL PURPOSES ONLY   Salicylate level     Status: None   Collection Time: 12/30/16  7:23 PM  Result Value Ref Range   Salicylate Lvl <7.0 2.8 - 30.0 mg/dL  Acetaminophen level     Status: Abnormal   Collection Time: 12/30/16  7:23 PM  Result Value Ref Range   Acetaminophen (Tylenol), Serum <10 (L) 10 - 30 ug/mL    Comment:        THERAPEUTIC CONCENTRATIONS VARY SIGNIFICANTLY. A RANGE OF 10-30 ug/mL MAY BE AN EFFECTIVE CONCENTRATION FOR MANY PATIENTS. HOWEVER, SOME ARE BEST TREATED AT CONCENTRATIONS OUTSIDE THIS RANGE. ACETAMINOPHEN CONCENTRATIONS >150 ug/mL AT 4 HOURS AFTER INGESTION AND >50 ug/mL AT 12 HOURS AFTER INGESTION ARE OFTEN ASSOCIATED WITH  TOXIC REACTIONS.   cbc     Status: None   Collection Time: 12/30/16  7:23 PM  Result Value Ref Range   WBC 8.3 3.8 - 10.6 K/uL   RBC 5.33 4.40 - 5.90 MIL/uL   Hemoglobin 16.5 13.0 - 18.0 g/dL   HCT 40.3 35.3 - 31.7 %   MCV 87.9 80.0 - 100.0 fL   MCH 30.9 26.0 - 34.0 pg   MCHC 35.1 32.0 - 36.0 g/dL   RDW 40.9 92.7 - 80.0 %   Platelets 303 150 - 440 K/uL  Urine Drug Screen, Qualitative     Status: None   Collection Time: 12/30/16  7:23 PM  Result Value Ref Range   Tricyclic, Ur Screen NONE DETECTED NONE DETECTED   Amphetamines, Ur Screen NONE DETECTED NONE DETECTED   MDMA (Ecstasy)Ur Screen NONE DETECTED NONE DETECTED   Cocaine Metabolite,Ur [place] NONE DETECTED NONE DETECTED   Opiate, Ur Screen NONE DETECTED NONE DETECTED   Phencyclidine (PCP) Ur S NONE DETECTED NONE DETECTED   Cannabinoid 50 Ng, Ur [place] NONE DETECTED NONE DETECTED   Barbiturates, Ur Screen NONE DETECTED NONE DETECTED   Benzodiazepine, Ur Scrn NONE DETECTED NONE DETECTED   Methadone Scn, Ur NONE DETECTED NONE DETECTED    Comment: (NOTE) 100  Tricyclics, urine               Cutoff 1000 ng/mL 200  Amphetamines, urine             Cutoff 1000 ng/mL 300  MDMA (Ecstasy), urine           Cutoff 500 ng/mL 400  Cocaine Metabolite, urine       Cutoff 300 ng/mL 500  Opiate, urine                   Cutoff 300 ng/mL 600  Phencyclidine (PCP), urine      Cutoff 25 ng/mL 700  Cannabinoid, urine              Cutoff 50 ng/mL 800  Barbiturates, urine             Cutoff 200 ng/mL 900  Benzodiazepine, urine           Cutoff 200  ng/mL 1000 Methadone, urine                Cutoff 300 ng/mL 1100 1200 The urine drug screen provides only a preliminary, unconfirmed 1300 analytical test result and should not be used for non-medical 1400 purposes. Clinical consideration and professional judgment should 1500 be applied to any positive drug screen result due to possible 1600 interfering substances. A more specific alternate chemical  method 1700 must be used in order to obtain a confirmed analytical result.  1800 Gas chromato graphy / mass spectrometry (GC/MS) is the preferred 1900 confirmatory method.     Blood Alcohol level:  Lab Results  Component Value Date   ETH <5 12/30/2016   ETH <5 95/18/8416    Metabolic Disorder Labs:  Lab Results  Component Value Date   HGBA1C 5.1 11/14/2015   Lab Results  Component Value Date   PROLACTIN 6.8 11/14/2015   Lab Results  Component Value Date   CHOL 245 (H) 12/25/2015   TRIG 179 (H) 12/25/2015   HDL 43 12/25/2015   CHOLHDL 6.2 11/14/2015   VLDL 49 (H) 11/14/2015   LDLCALC 166 (H) 12/25/2015   LDLCALC 196 (H) 11/14/2015    Current Medications: Current Facility-Administered Medications  Medication Dose Route Frequency Provider Last Rate Last Dose  . acetaminophen (TYLENOL) tablet 650 mg  650 mg Oral Q6H PRN Hildred Priest, MD   650 mg at 01/01/17 0855  . alum & mag hydroxide-simeth (MAALOX/MYLANTA) 200-200-20 MG/5ML suspension 30 mL  30 mL Oral Q4H PRN Hildred Priest, MD      . aspirin EC tablet 81 mg  81 mg Oral Daily Hildred Priest, MD   81 mg at 01/01/17 0854  . hydrochlorothiazide (HYDRODIURIL) tablet 25 mg  25 mg Oral Daily Hildred Priest, MD   25 mg at 01/01/17 0853  . levothyroxine (SYNTHROID, LEVOTHROID) tablet 25 mcg  25 mcg Oral QAC breakfast Hildred Priest, MD   25 mcg at 01/01/17 [phone]  . loratadine (CLARITIN) tablet 10 mg  10 mg Oral Daily Hildred Priest, MD   10 mg at 01/01/17 0854  . magnesium hydroxide (MILK OF MAGNESIA) suspension 30 mL  30 mL Oral Daily PRN Hildred Priest, MD      . multivitamin with minerals tablet 1 tablet  1 tablet Oral Daily Hildred Priest, MD   1 tablet at 01/01/17 0854  . nortriptyline (PAMELOR) capsule 50 mg  50 mg Oral QHS Hernandez-Gonzalez, Seth Bake, MD      . potassium chloride SA (K-DUR,KLOR-CON) CR tablet 40 mEq  40 mEq  Oral Daily Hildred Priest, MD   40 mEq at 01/01/17 0853  . rosuvastatin (CRESTOR) tablet 10 mg  10 mg Oral Daily Hildred Priest, MD   10 mg at 01/01/17 0160   PTA Medications: Prescriptions Prior to Admission  Medication Sig Dispense Refill Last Dose  . acetaminophen (TYLENOL) 500 MG tablet Take 500 mg by mouth every 6 (six) hours as needed for mild pain or moderate pain.    PRN at PRN  . aspirin 81 MG tablet Take 81 mg by mouth daily.   unknown at unknown  . Eluxadoline (VIBERZI) 75 MG TABS Take 75 mg by mouth daily. 30 tablet 1 unknown at unknown  . fexofenadine (ALLEGRA) 180 MG tablet Take 180 mg by mouth daily.   unknown at unknown  . hydrochlorothiazide (HYDRODIURIL) 25 MG tablet Take 1 tablet (25 mg total) by mouth daily. 30 tablet 12 unknown at unknown  . levothyroxine (SYNTHROID, LEVOTHROID) 25  MCG tablet Take 1 tablet (25 mcg total) by mouth daily. 90 tablet 0 unknown at unknown  . Multiple Vitamin (MULTIVITAMIN) tablet Take 1 tablet by mouth daily.   unknown at unknown  . potassium chloride SA (KLOR-CON M20) 20 MEQ tablet Take 1 tablet (20 mEq total) by mouth 2 (two) times daily. 28 tablet 0 unknown at unknown  . rosuvastatin (CRESTOR) 10 MG tablet Take 1 tablet (10 mg total) by mouth daily. 30 tablet 11 unknown at unknown  . VIAGRA 50 MG tablet Take 50 mg by mouth as needed for erectile dysfunction.   1 PRN at PRN  . vitamin C (ASCORBIC ACID) 500 MG tablet Take 500 mg by mouth daily.   unknown at unknown    Musculoskeletal: Strength & Muscle Tone: within normal limits Gait & Station: normal Patient leans: N/A  Psychiatric Specialty Exam: Physical Exam  Constitutional: He is oriented to person, place, and time. He appears well-developed and well-nourished.  HENT:  Head: Normocephalic and atraumatic.  Eyes: Conjunctivae and EOM are normal.  Neck: Normal range of motion.  Musculoskeletal: Normal range of motion.  Neurological: He is alert and oriented  to person, place, and time.    Review of Systems  Constitutional: Negative.   HENT: Negative.   Eyes: Negative.   Respiratory: Negative.   Cardiovascular: Negative.   Gastrointestinal: Positive for diarrhea.  Genitourinary: Negative.   Musculoskeletal: Negative.   Skin: Negative.   Neurological: Negative.   Endo/Heme/Allergies: Negative.   Psychiatric/Behavioral: Positive for depression and suicidal ideas. Negative for hallucinations, memory loss and substance abuse. The patient is not nervous/anxious and does not have insomnia.     Blood pressure 136/90, pulse 66, temperature 98.1 F (36.7 C), temperature source Oral, resp. rate 18, height '5\' 7"'$  (1.702 m), weight 68 kg (150 lb), SpO2 100 %.Body mass index is 23.49 kg/m.  General Appearance: Well Groomed  Eye Contact:  Good  Speech:  Clear and Coherent  Volume:  Normal  Mood:  Anxious and Dysphoric  Affect:  Blunt  Thought Process:  Linear and Descriptions of Associations: Intact  Orientation:  Full (Time, Place, and Person)  Thought Content:  Hallucinations: None  Suicidal Thoughts:  No  Homicidal Thoughts:  No  Memory:  Immediate;   Good Recent;   Good Remote;   Good  Judgement:  Poor  Insight:  Fair  Psychomotor Activity:  Normal  Concentration:  Concentration: Fair and Attention Span: Fair  Recall:  Good  Fund of Knowledge:  Good  Language:  Good  Akathisia:  No  Handed:    AIMS (if indicated):     Assets:  Communication Skills Physical Health  ADL's:  Intact  Cognition:  WNL  Sleep:  Number of Hours: 6.75    Treatment Plan Summary:  56 year old single Caucasian male with major depressive disorder, OCD and severe IBS. Patient presented to our ER due to exacerbation of depression along with suicidal ideation. Appears that the symptoms have worsened after he had stopped medications due to side effects.  Major depressive disorder patient will be started on nortriptyline 50 mg by mouth daily at bedtime. This  medication was chosen as he has a little serotonin activity. Usually serotonin exacerbates diarrhea and causes nausea.  Hypothyroidism continue Synthroid 25 g daily  Hypertension continue hydrochlorothiazide 25 mg a day  Dyslipidemia continue Crestor 10 mg daily  Hypokalemia continue tater 40 mEq a day. Potassium upon admission was 2.8. We'll need to recheck K  IBS continue  by viberzi 100 mg twice a day  Labs I will order TSH and B12 and will check potassium  EKG will be ordered as patient will be started on a TCA  Precautions every 15 minute checks  Diet regular  Hospitalization status voluntary  Vital signs daily       Physician Treatment Plan for Primary Diagnosis: Severe recurrent major depression without psychotic features ([place]) Long Term Goal(s): Improvement in symptoms so as ready for discharge  Short Term Goals: Ability to identify changes in lifestyle to reduce recurrence of condition will improve, Ability to verbalize feelings will improve, Ability to demonstrate self-control will improve and Ability to identify and develop effective coping behaviors will improve  Physician Treatment Plan for Secondary Diagnosis: Principal Problem:   Severe recurrent major depression without psychotic features ([place]) Active Problems:   HLD (hyperlipidemia)   HTN (hypertension)   Adult hypothyroidism   Adaptive colitis   OCD (obsessive compulsive disorder)  Long Term Goal(s): Improvement in symptoms so as ready for discharge  Short Term Goals: Ability to identify triggers associated with substance abuse/mental health issues will improve  I certify that inpatient services furnished can reasonably be expected to improve the patient's condition.    Hildred Priest, MD 7/25/20181:19 PM

## 2017-01-01 NOTE — BHH Counselor (Signed)
Adult Comprehensive Assessment  Patient ID: William Coleman., male   DOB: Feb 28, 1961, 56 y.o.   MRN: [id number]  Information Source: Information source: Patient  Current Stressors:  Physical health (include injuries & life threatening diseases): health issues: Bowel issues "for years". Very stressful. Social relationships: Pt is lonely.  Broke up 4 years ago and has not found a new relationship.  Living/Environment/Situation:  Living Arrangements: Alone Living conditions (as described by patient or guardian): Goes OK How long has patient lived in current situation?: 3 months-new apartment What is atmosphere in current home: Comfortable  Family History:  Marital status: Single Are you sexually active?: No What is your sexual orientation?: Heterosexual  Has your sexual activity been affected by drugs, alcohol, medication, or emotional stress?: No Does patient have children?: No  Childhood History:  By whom was/is the patient raised?: Both parents Additional childhood history information: Good childhood, positive experiences in school.   Description of patient's relationship with caregiver when they were a child: Good.  a little bumpy at times, but good. Patient's description of current relationship with people who raised him/her: mother died 66, father died 09/09/2014 How were you disciplined when you got in trouble as a child/adolescent?: Mostly verbal Does patient have siblings?: Yes Number of Siblings: 1 Description of patient's current relationship with siblings: sister.  Pretty good relationship, she is in [place]. Did patient suffer any verbal/emotional/physical/sexual abuse as a child?: No Did patient suffer from severe childhood neglect?: No Has patient ever been sexually abused/assaulted/raped as an adolescent or adult?: No Was the patient ever a victim of a crime or a disaster?: No Witnessed domestic violence?: No Has patient been effected by domestic violence as an  adult?: No  Education:  Highest grade of school patient has completed: 2 years of college Currently a Ship broker?: No Learning disability?: No  Employment/Work Situation:   Employment situation: Employed Where is patient currently employed?: [employer] long has patient been employed?: 3 years Patient's job has been impacted by current illness: No What is the longest time patient has a held a job?: 4-5 years  Where was the patient employed at that time?: [employer]  Has patient ever been in the [employer]?: No Are There Guns or Other Weapons in [place]?: No  Financial Resources:   Financial resources: Income from employment (some IRA income from parents)  Alcohol/Substance Abuse:   What has been your use of drugs/alcohol within the last 12 months?: Alcohol: rarely: less than once a month.  Denies drugs. If attempted suicide, did drugs/alcohol play a role in this?: No Alcohol/Substance Abuse Treatment Hx: Denies past history Has alcohol/substance abuse ever caused legal problems?: No  Social Support System:   Pensions consultant Support System: Fair Astronomer System: sister, brother in law Type of faith/religion: no How does patient's faith help to cope with current illness?: na  Leisure/Recreation:   Leisure and Hobbies: movies, walks, online  Strengths/Needs:   What things does the patient do well?: I'm alive, decent health, relatives In what areas does patient struggle / problems for patient: lonely, lack of relationship  Discharge Plan:   Does patient have access to transportation?: Yes (own car at [employer] currently) Will patient be returning to same living situation after discharge?: Yes Currently receiving community mental health services: Yes (From Whom) ([place]) Does patient have financial barriers related to discharge medications?: No  Summary/Recommendations:   Summary and Recommendations (to be completed by the evaluator): Pt is 56 year old male  from [place]. [facility])  Pt diagnosed with major depressive disorder and admitted due to increased deparession and suicidal ideation.  Recommendations for pt include crisis stabilization, therapeutic milieu, attend and participate in groups, medication management, and development of comprehensive mental wellness plan.  Upon discharge, pt would like to return to outpt services at [facility].  Joanne Chars. 01/01/2017

## 2017-01-01 NOTE — B[facility] Suicide Risk Assessment (Signed)
[facility] INPATIENT:  Family/Significant Other Suicide Prevention Education  Suicide Prevention Education:  Education Completed;  Marciano Sequin, sister, [phone], has been identified by the patient as the family member/significant other with whom the patient will be residing, and identified as the person(s) who will aid the patient in the event of a mental health crisis (suicidal ideations/suicide attempt).  With written consent from the patient, the family member/significant other has been provided the following suicide prevention education, prior to the and/or following the discharge of the patient.  The suicide prevention education provided includes the following:  Suicide risk factors  Suicide prevention and interventions  National Suicide Hotline telephone number  [facility] assessment telephone number  [facility] Emergency Assistance [place] and/or Residential Mobile Crisis Unit telephone number  Request made of family/significant other to:  Remove weapons (e.g., guns, rifles, knives), all items previously/currently identified as safety concern.  Opal Sidles does not believe pt has any guns.  Remove drugs/medications (over-the-counter, prescriptions, illicit drugs), all items previously/currently identified as a safety concern. Opal Sidles unsure about his.  The family member/significant other verbalizes understanding of the suicide prevention education information provided.  The family member/significant other agrees to remove the items of safety concern listed above.  Opal Sidles is in [place].  Pt code number given to her at pt's request.  Jane's son was married last week and pt was present and stayed at [facility] home, definitely having the GI issues but she was not aware that he was depressed.  She believed OCD plays a role in his situation.  He has, in the past, stopped psych meds due to their impact on GI/side effects.  She wonders if a group home would be better due to the  lonliness.  Pt does have money to potentially private pay for group home.  Joanne Chars, LCSW 01/01/2017, 2:25 PM

## 2017-01-02 LAB — MAGNESIUM: MAGNESIUM: 1.9 mg/dL (ref 1.7–2.4)

## 2017-01-02 MED ORDER — ATENOLOL 25 MG PO TABS
25.0000 mg | ORAL_TABLET | Freq: Every day | ORAL | Status: DC
  Administered 2017-01-02 – 2017-01-07 (×6): 25 mg via ORAL
  Filled 2017-01-02 (×6): qty 1

## 2017-01-02 MED ORDER — POTASSIUM CHLORIDE CRYS ER 20 MEQ PO TBCR
40.0000 meq | EXTENDED_RELEASE_TABLET | Freq: Two times a day (BID) | ORAL | Status: DC
  Administered 2017-01-02 – 2017-01-03 (×2): 40 meq via ORAL
  Filled 2017-01-02 (×2): qty 2

## 2017-01-02 NOTE — [facility] Group Notes (Signed)
Goals Group Date/Time: 01/02/2017 9:00 AM Type of Therapy and Topic: Group Therapy: Goals Group: SMART Goals   Participation Level: Moderate  Description of Group:    The purpose of a daily goals group is to assist and guide patients in setting recovery/wellness-related goals. The objective is to set goals as they relate to the crisis in which they were admitted. Patients will be using SMART goal modalities to set measurable goals. Characteristics of realistic goals will be discussed and patients will be assisted in setting and processing how one will reach their goal. Facilitator will also assist patients in applying interventions and coping skills learned in psycho-education groups to the SMART goal and process how one will achieve defined goal.   Therapeutic Goals:   -Patients will develop and document one goal related to or their crisis in which brought them into treatment.  -Patients will be guided by LCSW using SMART goal setting modality in how to set a measurable, attainable, realistic and time sensitive goal.  -Patients will process barriers in reaching goal.  -Patients will process interventions in how to overcome and successful in reaching goal.   Patient's Goal:Pt goal is "to get control of my symptoms."  CSW helped him to specify down to working out one new coping skill today.   Therapeutic Modalities:  Motivational Interviewing  Art gallery manager  SMART goals setting   Lurline Idol, [place]

## 2017-01-02 NOTE — Progress Notes (Signed)
Flat affect.  Continues to endorse depression and anxiety.  Denies SI.  Somatic. At times noted to just stand and stare.  Speech is tangential.  Support offered.  Safety maintained.

## 2017-01-02 NOTE — Progress Notes (Signed)
[facility] MD Progress Note  01/02/2017 9:51 AM William Coleman.  MRN:  [id number] Subjective:  Patient is a 56 year old single Caucasian male with a history of depression, OCD, and IBS. Patient arrived to our emergency department on July 23 on the involuntary commitment from [place] due to suicidal ideation. IVC paperwork stated that pt has SI with plans to "step in front of moving vehicle or wreck vehicle".  Patient reports having a long history of depression and also GI problems. Patient is currently been follow-up by [facility] for depression and by GI for chronic diarrhea. The patient is states that he has been tried on multiple medications for depression but they've worsened his gastrointestinal issues. He has been without any medications for at least 2 months. At this time he is not taking any medications and has been referred for transcranial magnetic stimulation.   Patient says that he always has thoughts of suicide but they have worsened lately as his diarrhea has exacerbated. He also feels very lonely as he has not been in any romantic relationships are more than 4 years. Patient says that he has been trying dating websites but he has not had any success.  During assessment the patient was still depression, suicidality and hopelessness.  7/26 patient says that he feels about the same as he did yesterday, very anxious, focused on his somatic complaints, voices hopelessness. Today however he is not suicidal. He is slept well last night. Denies problems with appetite, energy or concentration. He says that his bowel movements have been normal. He had a large bowel movement this morning that he feels is secondary to the Crestor.  He eventually ended up terminating the assessment because he felt he needed to use the restroom  (BM)   Per nursing: D: Pt denies SI/HI/AVH. Pt is pleasant and cooperative. Pt appears less anxious, not ruminating on same things during a conservation and less intrusive. Patient is  interacting with peers and staff appropriately. Patient's thoughts are organized no bizarre behavior noted  A: Pt was offered support and encouragement. Pt was given scheduled medications. Pt was encouraged to attend groups. Q 15 minute checks were done for safety.  R:Pt attends groups and interacts well with peers and staff. Pt is taking medication. Pt has no complaints.Pt receptive to treatment and safety maintained on unit.   Principal Problem: Severe recurrent major depression without psychotic features ([place]) Diagnosis:   Patient Active Problem List   Diagnosis Date Noted  . OCD (obsessive compulsive disorder) [F42.9] 01/01/2017  . 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 Severe recurrent major depression without psychotic features ([place]) [F33.2] 11/10/2015  . HLD (hyperlipidemia) [E78.5] 11/10/2014  . HTN (hypertension) [I10] 11/10/2014  . Adult hypothyroidism [E03.9] 11/10/2014  . 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 He has been tried on a multitude of SSRIs along with clomipramine all cause an exacerbation of GI symptoms. Patient denies history of suicidal attempts  Past Medical History:  F/u by Dr. Allen Norris for chronic diarrhea. On viberzi 100 mg po bid Past Medical History:  Diagnosis Date  . Anginal pain ([place])   . Bipolar disorder ([place])   . Chronic diarrhea   . Depression   . Hypertension   . Hypothyroidism   . Obsessive compulsive disorder   . Sleep apnea    patient states it was "marginal", no  CPAP    Past Surgical History:  Procedure Laterality Date  . COLONOSCOPY WITH PROPOFOL N/A 02/05/2016   Procedure: COLONOSCOPY WITH PROPOFOL;  Surgeon: [name], MD;  Location: [facility];  Service: Endoscopy;  Laterality: N/A;  . MOUTH SURGERY     orthodontic surgery for underbite  . MYRINGOTOMY WITH  TUBE PLACEMENT    . NASAL SEPTUM SURGERY    . POLYPECTOMY N/A 02/05/2016   Procedure: POLYPECTOMY;  Surgeon: [name], MD;  Location: [facility];  Service: Endoscopy;  Laterality: N/A;   Family History:  Family History  Problem Relation Age of Onset  . Depression Mother   . Anxiety disorder Mother   . Congestive Heart Failure Father   . Prostate cancer Father   . Hypertension Father    Family Psychiatric  History: Patient reports that his mother suffered from anxiety. There is no history of substance abuse or suicide  Social History: Patient lives alone. He is single, never married doesn't have any children. He is working part-time in [employer] he has held this job for the last 3 years. History  Alcohol Use  . Yes    Comment: rarely     History  Drug Use No    Social History   Social History  . Marital status: Single    Spouse name: N/A  . Number of children: N/A  . Years of education: N/A   Social History Main Topics  . Smoking status: Never Smoker  . Smokeless tobacco: Never Used  . Alcohol use Yes     Comment: rarely  . Drug use: No  . Sexual activity: No   Other Topics Concern  . None   Social History Narrative  . None     Current Medications: Current Facility-Administered Medications  Medication Dose Route Frequency Provider Last Rate Last Dose  . acetaminophen (TYLENOL) tablet 650 mg  650 mg Oral Q6H PRN Hildred Priest, MD   650 mg at 01/01/17 0855  . alum & mag hydroxide-simeth (MAALOX/MYLANTA) 200-200-20 MG/5ML suspension 30 mL  30 mL Oral Q4H PRN Hildred Priest, MD      . aspirin EC tablet 81 mg  81 mg Oral Daily Hildred Priest, MD   81 mg at 01/02/17 0831  . hydrochlorothiazide (HYDRODIURIL) tablet 25 mg  25 mg Oral Daily Hildred Priest, MD   25 mg at 01/02/17 0831  . levothyroxine (SYNTHROID, LEVOTHROID) tablet 25 mcg  25 mcg Oral QAC breakfast Hildred Priest, MD   25 mcg at  01/02/17 0704  . loratadine (CLARITIN) tablet 10 mg  10 mg Oral Daily Hildred Priest, MD   10 mg at 01/02/17 5498  . magnesium hydroxide (MILK OF MAGNESIA) suspension 30 mL  30 mL Oral Daily PRN Hildred Priest, MD      . multivitamin with minerals tablet 1 tablet  1 tablet Oral Daily Hildred Priest, MD   1 tablet at 01/02/17 0831  . nortriptyline (PAMELOR) capsule 50 mg  50 mg Oral QHS Hildred Priest, MD   50 mg at 01/01/17 2152  . potassium chloride SA (K-DUR,KLOR-CON) CR tablet 40 mEq  40 mEq Oral BID Hildred Priest, MD      . rosuvastatin (CRESTOR) tablet 10 mg  10 mg Oral Daily Hildred Priest, MD   10 mg at 01/02/17 2641    Lab Results:  Results for orders placed or performed during the hospital encounter of 12/31/16 (from the past 48 hour(s))  TSH     Status: None  Collection Time: 01/01/17  1:27 PM  Result Value Ref Range   TSH 0.432 0.350 - 4.500 uIU/mL    Comment: Performed by a 3rd Generation assay with a functional sensitivity of <=0.01 uIU/mL.  Vitamin B12     Status: None   Collection Time: 01/01/17  1:27 PM  Result Value Ref Range   Vitamin B-12 790 180 - 914 pg/mL    Comment: (NOTE) This assay is not validated for testing neonatal or myeloproliferative syndrome specimens for Vitamin B12 levels. Performed at [facility], [place] [address] [place], [place] [address]   Basic metabolic panel     Status: Abnormal   Collection Time: 01/01/17  1:27 PM  Result Value Ref Range   Sodium 138 135 - 145 mmol/L   Potassium 3.0 (L) 3.5 - 5.1 mmol/L   Chloride 96 (L) 101 - 111 mmol/L   CO2 30 22 - 32 mmol/L   Glucose, Bld 135 (H) 65 - 99 mg/dL   BUN 17 6 - 20 mg/dL   Creatinine, Ser 1.01 0.61 - 1.24 mg/dL   Calcium 10.1 8.9 - 10.3 mg/dL   GFR calc non Af Amer >60 >60 mL/min   GFR calc Af Amer >60 >60 mL/min    Comment: (NOTE) The eGFR has been calculated using the CKD EPI equation. This calculation  has not been validated in all clinical situations. eGFR's persistently <60 mL/min signify possible Chronic Kidney Disease.    Anion gap 12 5 - 15    Blood Alcohol level:  Lab Results  Component Value Date   ETH <5 12/30/2016   ETH <5 38/75/6433    Metabolic Disorder Labs: Lab Results  Component Value Date   HGBA1C 5.1 11/14/2015   Lab Results  Component Value Date   PROLACTIN 6.8 11/14/2015   Lab Results  Component Value Date   CHOL 245 (H) 12/25/2015   TRIG 179 (H) 12/25/2015   HDL 43 12/25/2015   CHOLHDL 6.2 11/14/2015   VLDL 49 (H) 11/14/2015   LDLCALC 166 (H) 12/25/2015   LDLCALC 196 (H) 11/14/2015    Physical Findings: AIMS: Facial and Oral Movements Muscles of Facial Expression: None, normal Lips and Perioral Area: None, normal Jaw: None, normal Tongue: None, normal,Extremity Movements Upper (arms, wrists, hands, fingers): None, normal Lower (legs, knees, ankles, toes): None, normal, Trunk Movements Neck, shoulders, hips: None, normal, Overall Severity Severity of abnormal movements (highest score from questions above): None, normal Incapacitation due to abnormal movements: None, normal Patient's awareness of abnormal movements (rate only patient's report): No Awareness, Dental Status Current problems with teeth and/or dentures?: No Does patient usually wear dentures?: No  CIWA:    COWS:     Musculoskeletal: Strength & Muscle Tone: within normal limits Gait & Station: normal Patient leans: N/A  Psychiatric Specialty Exam: Physical Exam  Constitutional: He is oriented to person, place, and time. He appears well-developed and well-nourished.  HENT:  Head: Normocephalic and atraumatic.  Eyes: Conjunctivae and EOM are normal.  Neck: Normal range of motion.  Respiratory: Effort normal.  Musculoskeletal: Normal range of motion.  Neurological: He is alert and oriented to person, place, and time.    Review of Systems  Constitutional: Negative.    HENT: Negative.   Eyes: Negative.   Respiratory: Negative.   Cardiovascular: Negative.   Genitourinary: Negative.   Musculoskeletal: Negative.   Skin: Negative.   Neurological: Negative.   Endo/Heme/Allergies: Negative.   Psychiatric/Behavioral: Positive for depression. The patient is nervous/anxious.  Blood pressure (!) 147/98, pulse 82, temperature 98.1 F (36.7 C), temperature source Oral, resp. rate 18, height 5' 7"  (1.702 m), weight 68 kg (150 lb), SpO2 100 %.Body mass index is 23.49 kg/m.  General Appearance: Well Groomed  Eye Contact:  Good  Speech:  Clear and Coherent  Volume:  Normal  Mood:  Anxious and Dysphoric  Affect:  Congruent  Thought Process:  Linear and Descriptions of Associations: Circumstantial  Orientation:  Full (Time, Place, and Person)  Thought Content:  Hallucinations: None  Suicidal Thoughts:  No  Homicidal Thoughts:  No  Memory:  Immediate;   Good Recent;   Good Remote;   Good  Judgement:  Fair  Insight:  Fair  Psychomotor Activity:  Normal  Concentration:  Concentration: Fair and Attention Span: Fair  Recall:  Good  Fund of Knowledge:  Good  Language:  Good  Akathisia:  No  Handed:    AIMS (if indicated):     Assets:  Communication Skills Physical Health  ADL's:  Intact  Cognition:  WNL  Sleep:  Number of Hours: 6.75     Treatment Plan Summary: Daily contact with patient to assess and evaluate symptoms and progress in treatment and Medication management   56 year old single Caucasian male with major depressive disorder, OCD and severe IBS. Patient presented to our ER due to exacerbation of depression along with suicidal ideation. Appears that the symptoms have worsened after he had stopped medications due to side effects.  Major depressive disorder patient has been started on  nortriptyline 50 mg by mouth daily at bedtime. This medication was chosen as he has a little serotonin activity. Usually serotonin exacerbates diarrhea and  causes nausea.  Hypothyroidism continue Synthroid 25 g daily  Hypertension: will d/c HCTZ due to hypokalemia. Will try atenol 25 mg q day  Dyslipidemia: will d/c crestor as pt says it worsens his GI problems  Hypokalemia: K continues to be low will increase potassium to 40 meq bid  IBS: not on any meds at this time  Labs: TSH and B12 are wnl.  Will need to recheck K in 2 days  EKG: wnl  Precautions every 15 minute checks  Diet regular  Hospitalization status voluntary  Vital signs daily   Hildred Priest, MD 01/02/2017, 9:51 AM

## 2017-01-02 NOTE — [facility] Group Notes (Signed)
[facility] LCSW Group Therapy   01/02/2017 9:30am   Type of Therapy: Group Therapy   Participation Level: Active   Participation Quality: Attentive, Sharing and Supportive   Affect: Appropriate   Cognitive: Alert and Oriented   Insight: Developing/Improving and Engaged   Engagement in Therapy: Developing/Improving and Engaged   Modes of Intervention: Clarification, Confrontation, Discussion, Education, Exploration, Limit-setting, Orientation, Problem-solving, Rapport Building, Art therapist, Socialization and Support   Summary of Progress/Problems: The topic for group was balance in life. Today's group focused on defining balance in one's own words, identifying things that can knock one off balance, and exploring healthy ways to maintain balance in life. Group members were asked to provide an example of a time when they felt off balance, describe how they handled that situation, and process healthier ways to regain balance in the future. Group members were asked to share the most important tool for maintaining balance that they learned while at [facility] and how they plan to apply this method after discharge. Patient defined balance in the amount of balance that is in his life. Patient identified two areas in his life that are out of balance. Patient created a change plan to bring more balanced into those 2 areas. CSW provided support to this patient.  Glorious Peach, MSW, LCSW-A 01/02/2017, 2:24PM

## 2017-01-02 NOTE — Plan of Care (Signed)
Problem: Medication: Goal: Compliance with prescribed medication regimen will improve Outcome: Progressing Medication compliant   

## 2017-01-02 NOTE — Progress Notes (Signed)
D: Pt denies SI/HI/AVH. Pt is pleasant and cooperative. Pt appears less anxious, not ruminating on same things during a conservation and less intrusive. Patient is interacting with peers and staff appropriately. Patient's thoughts are organized no bizarre behavior noted  A: Pt was offered support and encouragement. Pt was given scheduled medications. Pt was encouraged to attend groups. Q 15 minute checks were done for safety.  R:Pt attends groups and interacts well with peers and staff. Pt is taking medication. Pt has no complaints.Pt receptive to treatment and safety maintained on unit.

## 2017-01-03 ENCOUNTER — Telehealth: Payer: Self-pay | Admitting: Family Medicine

## 2017-01-03 MED ORDER — LOPERAMIDE HCL 2 MG PO CAPS
2.0000 mg | ORAL_CAPSULE | ORAL | Status: DC | PRN
  Administered 2017-01-03 – 2017-01-04 (×2): 2 mg via ORAL
  Filled 2017-01-03 (×2): qty 1

## 2017-01-03 MED ORDER — POTASSIUM CHLORIDE CRYS ER 20 MEQ PO TBCR
40.0000 meq | EXTENDED_RELEASE_TABLET | Freq: Two times a day (BID) | ORAL | Status: DC
  Filled 2017-01-03: qty 2

## 2017-01-03 NOTE — Telephone Encounter (Signed)
[employer] faxed a request on the following medication. Thanks CC  rosuvastatin (CRESTOR) 10 MG tablet >Take 1 tablet by mouth daily.

## 2017-01-03 NOTE — Telephone Encounter (Signed)
Patient needs appointment, last visit and it was acute was in November 2017. Patient is currently admitted at the hospital, will wait to call patient-aa

## 2017-01-03 NOTE — [facility] Group Notes (Signed)
[facility] LCSW Group Therapy Note  Date/Time: 01/03/17, 0930  Type of Therapy and Topic:  Group Therapy:  Feelings around Relapse and Recovery  Participation Level:  Did Not Attend   Mood:  Description of Group:    Patients in this group will discuss emotions they experience before and after a relapse. They will process how experiencing these feelings, or avoidance of experiencing them, relates to having a relapse. Facilitator will guide patients to explore emotions they have related to recovery. Patients will be encouraged to process which emotions are more powerful. They will be guided to discuss the emotional reaction significant others in their lives may have to patients' relapse or recovery. Patients will be assisted in exploring ways to respond to the emotions of others without this contributing to a relapse.  Therapeutic Goals: 1. Patient will identify two or more emotions that lead to relapse for them:  2. Patient will identify two emotions that result when they relapse:  3. Patient will identify two emotions related to recovery:  4. Patient will demonstrate ability to communicate their needs through discussion and/or role plays.   Summary of Patient Progress:     Therapeutic Modalities:   Cognitive Behavioral Therapy Solution-Focused Therapy Assertiveness Training Relapse Prevention Therapy  Lurline Idol, LCSW

## 2017-01-03 NOTE — Progress Notes (Signed)
States that his depression has not changed and rates it as a 6/10. Preoccupied with his IBS and being lonely.  Verbalizes that no one wants a relationship.  Encouraged him to just enjoy the friendship and not try to rush things.  Medication compliant.  Support offered.  Safety maintained.

## 2017-01-03 NOTE — Progress Notes (Signed)
[facility] MD Progress Note  01/03/2017 10:35 AM William Coleman.  MRN:  [id number] Subjective:  Patient is a 56 year old single Caucasian male with a history of depression, OCD, and IBS. Patient arrived to our emergency department on July 23 on the involuntary commitment from [place] due to suicidal ideation. IVC paperwork stated that pt has SI with plans to "step in front of moving vehicle or wreck vehicle".  Patient reports having a long history of depression and also GI problems. Patient is currently been follow-up by [facility] for depression and by GI for chronic diarrhea. The patient is states that he has been tried on multiple medications for depression but they've worsened his gastrointestinal issues. He has been without any medications for at least 2 months. At this time he is not taking any medications and has been referred for transcranial magnetic stimulation.   Patient says that he always has thoughts of suicide but they have worsened lately as his diarrhea has exacerbated. He also feels very lonely as he has not been in any romantic relationships are more than 4 years. Patient says that he has been trying dating websites but he has not had any success.  During assessment the patient was still depression, suicidality and hopelessness.  7/26 patient says that he feels about the same as he did yesterday, very anxious, focused on his somatic complaints, voices hopelessness. Today however he is not suicidal. He is slept well last night. Denies problems with appetite, energy or concentration. He says that his bowel movements have been normal. He had a large bowel movement this morning that he feels is secondary to the Crestor.  He eventually ended up terminating the assessment because he felt he needed to use the restroom  (BM)  7/27 patient continues to be focused on somatic complaints involving GI tract and sinus. Patient talks at length about the BM he had this morning. Continues to as the same  questions he has been asking since admission. He repeats himself multiple times, constantly ruminating about the somatic issues. Says he feels about the same as he did when he came in. Feels that a lot of his problems are caused by the physical issues. He is frustrated as the physical issues have not improved even though he has seen multiple doctors and tried multiple different medications.    Per nursing: D: Pt denies SI/HI/AVH. Pt is pleasant and cooperative. Pt stated he was lonely since his break up 4 yrs ago.pt stated he was lonely and depressed.  A: Pt was offered support and encouragement. Pt was given scheduled medications. Pt was encourage to attend groups. Q 15 minute checks were done for safety.   R:Pt attends groups and interacts well with peers and staff. Pt is taking medication. Pt has no complaints.Pt receptive to treatment and safety maintained on unit..   Principal Problem: Severe recurrent major depression without psychotic features ([place]) Diagnosis:   Patient Active Problem List   Diagnosis Date Noted  . OCD (obsessive compulsive disorder) [F42.9] 01/01/2017  . Severe recurrent major depression without psychotic features ([place]) [F33.2] 11/10/2015  . HLD (hyperlipidemia) [E78.5] 11/10/2014  . HTN (hypertension) [I10] 11/10/2014  . Adult hypothyroidism [E03.9] 11/10/2014  . 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 He has been tried on a multitude of SSRIs along with clomipramine all cause an exacerbation of GI symptoms. Patient  denies history of suicidal attempts  Past Medical History:  F/u by Dr. Allen Norris for chronic diarrhea. On viberzi 100 mg po bid Past Medical History:  Diagnosis Date  . Anginal pain ([place])   . Bipolar disorder ([place])   . Chronic diarrhea   . Depression   . Hypertension   . Hypothyroidism    . Obsessive compulsive disorder   . Sleep apnea    patient states it was "marginal", no CPAP    Past Surgical History:  Procedure Laterality Date  . COLONOSCOPY WITH PROPOFOL N/A 02/05/2016   Procedure: COLONOSCOPY WITH PROPOFOL;  Surgeon: [name], MD;  Location: [place];  Service: Endoscopy;  Laterality: N/A;  . MOUTH SURGERY     orthodontic surgery for underbite  . MYRINGOTOMY WITH TUBE PLACEMENT    . NASAL SEPTUM SURGERY    . POLYPECTOMY N/A 02/05/2016   Procedure: POLYPECTOMY;  Surgeon: [name], MD;  Location: [place];  Service: Endoscopy;  Laterality: N/A;   Family History:  Family History  Problem Relation Age of Onset  . Depression Mother   . Anxiety disorder Mother   . Congestive Heart Failure Father   . Prostate cancer Father   . Hypertension Father    Family Psychiatric  History: Patient reports that his mother suffered from anxiety. There is no history of substance abuse or suicide  Social History: Patient lives alone. He is single, never married doesn't have any children. He is working part-time in [employer] he has held this job for the last 3 years. History  Alcohol Use  . Yes    Comment: rarely     History  Drug Use No    Social History   Social History  . Marital status: Single    Spouse name: N/A  . Number of children: N/A  . Years of education: N/A   Social History Main Topics  . Smoking status: Never Smoker  . Smokeless tobacco: Never Used  . Alcohol use Yes     Comment: rarely  . Drug use: No  . Sexual activity: No   Other Topics Concern  . None   Social History Narrative  . None     Current Medications: Current Facility-Administered Medications  Medication Dose Route Frequency Provider Last Rate Last Dose  . acetaminophen (TYLENOL) tablet 650 mg  650 mg Oral Q6H PRN Hildred Priest, MD   650 mg at 01/03/17 0825  . alum & mag hydroxide-simeth (MAALOX/MYLANTA) 200-200-20 MG/5ML suspension  30 mL  30 mL Oral Q4H PRN Hildred Priest, MD      . aspirin EC tablet 81 mg  81 mg Oral Daily Hildred Priest, MD   81 mg at 01/03/17 0825  . atenolol (TENORMIN) tablet 25 mg  25 mg Oral Daily Hildred Priest, MD   25 mg at 01/03/17 0825  . levothyroxine (SYNTHROID, LEVOTHROID) tablet 25 mcg  25 mcg Oral QAC breakfast Hildred Priest, MD   25 mcg at 01/03/17 [phone]  . loratadine (CLARITIN) tablet 10 mg  10 mg Oral Daily Hildred Priest, MD   10 mg at 01/03/17 0825  . magnesium hydroxide (MILK OF MAGNESIA) suspension 30 mL  30 mL Oral Daily PRN Hildred Priest, MD      . multivitamin with minerals tablet 1 tablet  1 tablet Oral Daily Hildred Priest, MD   1 tablet at 01/03/17 0825  . nortriptyline (PAMELOR) capsule 50 mg  50 mg Oral QHS Hildred Priest, MD   50 mg at  01/02/17 2133  . potassium chloride SA (K-DUR,KLOR-CON) CR tablet 40 mEq  40 mEq Oral BID Hildred Priest, MD        Lab Results:  Results for orders placed or performed during the hospital encounter of 12/31/16 (from the past 48 hour(s))  TSH     Status: None   Collection Time: 01/01/17  1:27 PM  Result Value Ref Range   TSH 0.432 0.350 - 4.500 uIU/mL    Comment: Performed by a 3rd Generation assay with a functional sensitivity of <=0.01 uIU/mL.  Vitamin B12     Status: None   Collection Time: 01/01/17  1:27 PM  Result Value Ref Range   Vitamin B-12 790 180 - 914 pg/mL    Comment: (NOTE) This assay is not validated for testing neonatal or myeloproliferative syndrome specimens for Vitamin B12 levels. Performed at [facility], [place] [address] [place], [place] [address]   Basic metabolic panel     Status: Abnormal   Collection Time: 01/01/17  1:27 PM  Result Value Ref Range   Sodium 138 135 - 145 mmol/L   Potassium 3.0 (L) 3.5 - 5.1 mmol/L   Chloride 96 (L) 101 - 111 mmol/L   CO2 30 22 - 32 mmol/L   Glucose, Bld 135  (H) 65 - 99 mg/dL   BUN 17 6 - 20 mg/dL   Creatinine, Ser 1.01 0.61 - 1.24 mg/dL   Calcium 10.1 8.9 - 10.3 mg/dL   GFR calc non Af Amer >60 >60 mL/min   GFR calc Af Amer >60 >60 mL/min    Comment: (NOTE) The eGFR has been calculated using the CKD EPI equation. This calculation has not been validated in all clinical situations. eGFR's persistently <60 mL/min signify possible Chronic Kidney Disease.    Anion gap 12 5 - 15  Magnesium     Status: None   Collection Time: 01/01/17  1:27 PM  Result Value Ref Range   Magnesium 1.9 1.7 - 2.4 mg/dL    Blood Alcohol level:  Lab Results  Component Value Date   ETH <5 12/30/2016   ETH <5 09/98/3382    Metabolic Disorder Labs: Lab Results  Component Value Date   HGBA1C 5.1 11/14/2015   Lab Results  Component Value Date   PROLACTIN 6.8 11/14/2015   Lab Results  Component Value Date   CHOL 245 (H) 12/25/2015   TRIG 179 (H) 12/25/2015   HDL 43 12/25/2015   CHOLHDL 6.2 11/14/2015   VLDL 49 (H) 11/14/2015   LDLCALC 166 (H) 12/25/2015   LDLCALC 196 (H) 11/14/2015    Physical Findings: AIMS: Facial and Oral Movements Muscles of Facial Expression: None, normal Lips and Perioral Area: None, normal Jaw: None, normal Tongue: None, normal,Extremity Movements Upper (arms, wrists, hands, fingers): None, normal Lower (legs, knees, ankles, toes): None, normal, Trunk Movements Neck, shoulders, hips: None, normal, Overall Severity Severity of abnormal movements (highest score from questions above): None, normal Incapacitation due to abnormal movements: None, normal Patient's awareness of abnormal movements (rate only patient's report): No Awareness, Dental Status Current problems with teeth and/or dentures?: No Does patient usually wear dentures?: No  CIWA:    COWS:     Musculoskeletal: Strength & Muscle Tone: within normal limits Gait & Station: normal Patient leans: N/A  Psychiatric Specialty Exam: Physical Exam   Constitutional: He is oriented to person, place, and time. He appears well-developed and well-nourished.  HENT:  Head: Normocephalic and atraumatic.  Eyes: Conjunctivae and EOM are  normal.  Neck: Normal range of motion.  Respiratory: Effort normal.  Musculoskeletal: Normal range of motion.  Neurological: He is alert and oriented to person, place, and time.    Review of Systems  Constitutional: Negative.   HENT: Negative.   Eyes: Negative.   Respiratory: Negative.   Cardiovascular: Negative.   Gastrointestinal:       Large BM  Genitourinary: Negative.   Musculoskeletal: Negative.   Skin: Negative.   Neurological: Negative.   Endo/Heme/Allergies: Negative.   Psychiatric/Behavioral: Positive for depression. Negative for hallucinations, memory loss, substance abuse and suicidal ideas. The patient is nervous/anxious. The patient does not have insomnia.     Blood pressure 119/81, pulse 68, temperature 97.6 F (36.4 C), temperature source Oral, resp. rate 18, height _0  (1.702 m), weight 68 kg (150 lb), SpO2 100 %.Body mass index is 23.49 kg/m.  General Appearance: Well Groomed  Eye Contact:  Good  Speech:  Clear and Coherent  Volume:  Normal  Mood:  Anxious and Dysphoric  Affect:  Congruent  Thought Process:  Linear and Descriptions of Associations: Circumstantial  Orientation:  Full (Time, Place, and Person)  Thought Content:  Hallucinations: None  Suicidal Thoughts:  No  Homicidal Thoughts:  No  Memory:  Immediate;   Good Recent;   Good Remote;   Good  Judgement:  Fair  Insight:  Fair  Psychomotor Activity:  Normal  Concentration:  Concentration: Fair and Attention Span: Fair  Recall:  Good  Fund of Knowledge:  Good  Language:  Good  Akathisia:  No  Handed:    AIMS (if indicated):     Assets:  Communication Skills Physical Health  ADL's:  Intact  Cognition:  WNL  Sleep:  Number of Hours: 6.45     Treatment Plan Summary: Daily contact with patient to assess  and evaluate symptoms and progress in treatment and Medication management   56 year old single Caucasian male with major depressive disorder, OCD and severe IBS. Patient presented to our ER due to exacerbation of depression along with suicidal ideation. Appears that the symptoms have worsened after he had stopped medications due to side effects.  Major depressive disorder patient has been started on  nortriptyline 50 mg by mouth daily at bedtime. This medication was chosen as he has a little serotonin activity. Usually serotonin exacerbates diarrhea and causes nausea.  OCD: Appears that part of his concern with the somatic issues is obsessive in nature. Likely part of obsessive-compulsive disorder. Pt does not have any compulsive behaviors that I can see. Seems consumed with preoccupation. Ruminates constantly and repeats same questions about his GI and sinus problems  Hypothyroidism continue Synthroid 25 g daily  Hypertension: now on atenolol 25 mg po q day BP wnl  Dyslipidemia: will d/c crestor as pt says it worsens his GI problems  Hypokalemia: K continues to be low will increase potassium to 40 meq bid--2 more doses  IBS: not on any meds at this time  Labs: TSH and B12 are wnl.   EKG: wnl  Precautions every 15 minute checks  Diet regular  Hospitalization status voluntary  Vital signs daily    Hildred Priest, MD 01/03/2017, 10:35 AM

## 2017-01-03 NOTE — Progress Notes (Signed)
D: Pt denies SI/HI/AVH. Pt is pleasant and cooperative. Pt stated he was lonely since his break up 4 yrs ago.pt stated he was lonely and depressed.  A: Pt was offered support and encouragement. Pt was given scheduled medications. Pt was encourage to attend groups. Q 15 minute checks were done for safety.   R:Pt attends groups and interacts well with peers and staff. Pt is taking medication. Pt has no complaints.Pt receptive to treatment and safety maintained on unit.Marland Kitchen

## 2017-01-04 MED ORDER — LOPERAMIDE HCL 2 MG PO CAPS: 2.0000 mg | ORAL_CAPSULE | Freq: Two times a day (BID) | ORAL | Status: DC | PRN

## 2017-01-04 MED ORDER — NORTRIPTYLINE HCL 25 MG PO CAPS
75.0000 mg | ORAL_CAPSULE | Freq: Every day | ORAL | Status: DC
  Administered 2017-01-04 – 2017-01-05 (×2): 75 mg via ORAL
  Filled 2017-01-04 (×3): qty 3

## 2017-01-04 NOTE — [facility] Group Notes (Signed)
[place] Group Notes:  (Nursing/MHT/Case Management/Adjunct)  Date:  01/04/2017  Time:  7:13 AM  Type of Therapy:  Psychoeducational Skills  Participation Level:  Active  Participation Quality:  Appropriate and Attentive  Affect:  Appropriate  Cognitive:  Appropriate  Insight:  Appropriate and Good  Engagement in Group:  Engaged  Modes of Intervention:  Discussion, Socialization and Support  Summary of Progress/Problems:  Reece Agar 01/04/2017, 7:13 AM

## 2017-01-04 NOTE — Plan of Care (Signed)
Problem: Coping: Goal: Ability to cope will improve Outcome: Progressing Patient expresses his loneliness and trying to understand how he can meet some one, but talking with staff is helping a little per patient. Will continue to monitor.

## 2017-01-04 NOTE — Plan of Care (Signed)
Problem: Medication: Goal: Compliance with prescribed medication regimen will improve Outcome: Progressing Patient compliant with medication

## 2017-01-04 NOTE — Progress Notes (Signed)
[facility] MD Progress Note  01/04/2017 11:52 AM William Coleman.  MRN:  [id number] Subjective:  Patient is a 56 year old single Caucasian male with a history of depression, OCD, and IBS. Patient arrived to our emergency department on July 23 on the involuntary commitment from [facility] due to suicidal ideation. IVC paperwork stated that pt has SI with plans to "step in front of moving vehicle or wreck vehicle".  Patient reports having a long history of depression and also GI problems. Patient is currently been follow-up by [facility] for depression and by GI for chronic diarrhea. The patient is states that he has been tried on multiple medications for depression but they've worsened his gastrointestinal issues. He has been without any medications for at least 2 months. At this time he is not taking any medications and has been referred for transcranial magnetic stimulation.   Patient says that he always has thoughts of suicide but they have worsened lately as his diarrhea has exacerbated. He also feels very lonely as he has not been in any romantic relationships are more than 4 years. Patient says that he has been trying dating websites but he has not had any success.  During assessment the patient was still depression, suicidality and hopelessness.  7/26 patient says that he feels about the same as he did yesterday, very anxious, focused on his somatic complaints, voices hopelessness. Today however he is not suicidal. He is slept well last night. Denies problems with appetite, energy or concentration. He says that his bowel movements have been normal. He had a large bowel movement this morning that he feels is secondary to the Crestor.  He eventually ended up terminating the assessment because he felt he needed to use the restroom  (BM)  7/27 patient continues to be focused on somatic complaints involving GI tract and sinus. Patient talks at length about the BM he had this morning. Continues to as the same  questions he has been asking since admission. He repeats himself multiple times, constantly ruminating about the somatic issues. Says he feels about the same as he did when he came in. Feels that a lot of his problems are caused by the physical issues. He is frustrated as the physical issues have not improved even though he has seen multiple doctors and tried multiple different medications.   7/28 patient reports that he spends a lot of time in the restroom  because he has to wipe himself. He says that his bowel movements are really messy and therefore he spends a lot of time in the bathroom. We talk about the obsessive nature of his thoughts and compulsive behaviors he is displaying. Patient says this is not OCD, he says his symptoms are real and they are the cause of his depression. Patient continues to ruminate about these things. He said he is not feeling much different. He says that he is spent hours in the afternoon the bathroom yesterday. He requested Imodium from the nurses. However nurses said that they would have to see that he is actually having diarrhea before giving it to him.  Once again very somatic, constantly asking about the side effects of medications, repeats the same questions and several statements that have been already reviewed and discussed with him in prior days.   Per nursing: D: Pt denies SI/HI/AVH. Pt is pleasant and cooperative, affect is flat and sad but brightens up upon approach. Patient denies pain or discomfort no distress noted. Pt appears less anxious and he is interacting  with peers and staff appropriately.  A: Pt was offered support and encouragement. Pt was given scheduled medications. Pt was encouraged to attend groups. Q 15 minute checks were done for safety.  R:Pt attends groups and interacts well with peers and staff. Pt is taking medication. Pt has no complaints.Pt receptive to treatment and safety maintained on unit.  Principal Problem: Severe recurrent major  depression without psychotic features ([place]) Diagnosis:   Patient Active Problem List   Diagnosis Date Noted  . OCD (obsessive compulsive disorder) [F42.9] 01/01/2017  . Severe recurrent major depression without psychotic features ([place]) [F33.2] 11/10/2015  . HLD (hyperlipidemia) [E78.5] 11/10/2014  . HTN (hypertension) [I10] 11/10/2014  . Adult hypothyroidism [E03.9] 11/10/2014  . 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 He has been tried on a multitude of SSRIs along with clomipramine all cause an exacerbation of GI symptoms. Patient denies history of suicidal attempts  Past Medical History:  F/u by Dr. Allen Norris for chronic diarrhea. On viberzi 100 mg po bid Past Medical History:  Diagnosis Date  . Anginal pain ([place])   . Bipolar disorder ([place])   . Chronic diarrhea   . Depression   . Hypertension   . Hypothyroidism   . Obsessive compulsive disorder   . Sleep apnea    patient states it was "marginal", no CPAP    Past Surgical History:  Procedure Laterality Date  . COLONOSCOPY WITH PROPOFOL N/A 02/05/2016   Procedure: COLONOSCOPY WITH PROPOFOL;  Surgeon: [name], MD;  Location: [facility];  Service: Endoscopy;  Laterality: N/A;  . MOUTH SURGERY     orthodontic surgery for underbite  . MYRINGOTOMY WITH TUBE PLACEMENT    . NASAL SEPTUM SURGERY    . POLYPECTOMY N/A 02/05/2016   Procedure: POLYPECTOMY;  Surgeon: [name], MD;  Location: [facility];  Service: Endoscopy;  Laterality: N/A;   Family History:  Family History  Problem Relation Age of Onset  . Depression Mother   . Anxiety disorder Mother   . Congestive Heart Failure Father   . Prostate cancer Father   . Hypertension Father    Family Psychiatric  History: Patient reports that his mother suffered from anxiety. There is no history of  substance abuse or suicide  Social History: Patient lives alone. He is single, never married doesn't have any children. He is working part-time in [employer] he has held this job for the last 3 years. History  Alcohol Use  . Yes    Comment: rarely     History  Drug Use No    Social History   Social History  . Marital status: Single    Spouse name: N/A  . Number of children: N/A  . Years of education: N/A   Social History Main Topics  . Smoking status: Never Smoker  . Smokeless tobacco: Never Used  . Alcohol use Yes     Comment: rarely  . Drug use: No  . Sexual activity: No   Other Topics Concern  . None   Social History Narrative  . None     Current Medications: Current Facility-Administered Medications  Medication Dose Route Frequency Provider Last Rate Last Dose  . acetaminophen (TYLENOL) tablet 650 mg  650 mg Oral Q6H PRN Hildred Priest, MD   650 mg at 01/03/17 0825  . alum & mag hydroxide-simeth (MAALOX/MYLANTA) 200-200-20  MG/5ML suspension 30 mL  30 mL Oral Q4H PRN Hildred Priest, MD      . aspirin EC tablet 81 mg  81 mg Oral Daily Hildred Priest, MD   81 mg at 01/04/17 0829  . atenolol (TENORMIN) tablet 25 mg  25 mg Oral Daily Hildred Priest, MD   25 mg at 01/04/17 0829  . levothyroxine (SYNTHROID, LEVOTHROID) tablet 25 mcg  25 mcg Oral QAC breakfast Hildred Priest, MD   25 mcg at 01/04/17 [phone]  . loperamide (IMODIUM) capsule 2 mg  2 mg Oral PRN Hildred Priest, MD   2 mg at 01/04/17 1142  . loratadine (CLARITIN) tablet 10 mg  10 mg Oral Daily Hildred Priest, MD   10 mg at 01/04/17 0829  . magnesium hydroxide (MILK OF MAGNESIA) suspension 30 mL  30 mL Oral Daily PRN Hildred Priest, MD      . multivitamin with minerals tablet 1 tablet  1 tablet Oral Daily Hildred Priest, MD   1 tablet at 01/04/17 0829  . nortriptyline (PAMELOR) capsule 50 mg  50 mg Oral  QHS Hildred Priest, MD   50 mg at 01/03/17 2125  . potassium chloride SA (K-DUR,KLOR-CON) CR tablet 40 mEq  40 mEq Oral BID Hildred Priest, MD        Lab Results:  No results found for this or any previous visit (from the past 48 hour(s)).  Blood Alcohol level:  Lab Results  Component Value Date   [facility] <5 12/30/2016   ETH <5 22/29/7989    Metabolic Disorder Labs: Lab Results  Component Value Date   HGBA1C 5.1 11/14/2015   Lab Results  Component Value Date   PROLACTIN 6.8 11/14/2015   Lab Results  Component Value Date   CHOL 245 (H) 12/25/2015   TRIG 179 (H) 12/25/2015   HDL 43 12/25/2015   CHOLHDL 6.2 11/14/2015   VLDL 49 (H) 11/14/2015   LDLCALC 166 (H) 12/25/2015   LDLCALC 196 (H) 11/14/2015    Physical Findings: AIMS: Facial and Oral Movements Muscles of Facial Expression: None, normal Lips and Perioral Area: None, normal Jaw: None, normal Tongue: None, normal,Extremity Movements Upper (arms, wrists, hands, fingers): None, normal Lower (legs, knees, ankles, toes): None, normal, Trunk Movements Neck, shoulders, hips: None, normal, Overall Severity Severity of abnormal movements (highest score from questions above): None, normal Incapacitation due to abnormal movements: None, normal Patient's awareness of abnormal movements (rate only patient's report): No Awareness, Dental Status Current problems with teeth and/or dentures?: No Does patient usually wear dentures?: No  CIWA:    COWS:     Musculoskeletal: Strength & Muscle Tone: within normal limits Gait & Station: normal Patient leans: N/A  Psychiatric Specialty Exam: Physical Exam  Constitutional: He is oriented to person, place, and time. He appears well-developed and well-nourished.  HENT:  Head: Normocephalic and atraumatic.  Eyes: Conjunctivae and EOM are normal.  Neck: Normal range of motion.  Respiratory: Effort normal.  Musculoskeletal: Normal range of motion.   Neurological: He is alert and oriented to person, place, and time.    Review of Systems  Constitutional: Negative.   HENT: Negative.   Eyes: Negative.   Respiratory: Negative.   Cardiovascular: Negative.   Gastrointestinal: Positive for diarrhea.       Large BM  Genitourinary: Negative.   Musculoskeletal: Negative.   Skin: Negative.   Neurological: Negative.   Endo/Heme/Allergies: Negative.   Psychiatric/Behavioral: Positive for depression. Negative for hallucinations, memory loss, substance abuse and suicidal ideas. The patient  is nervous/anxious. The patient does not have insomnia.     Blood pressure 126/89, pulse 67, temperature 97.7 F (36.5 C), temperature source Oral, resp. rate 18, height 5\' 7"  (1.702 m), weight 68 kg (150 lb), SpO2 100 %.Body mass index is 23.49 kg/m.  General Appearance: Well Groomed  Eye Contact:  Good  Speech:  Clear and Coherent  Volume:  Normal  Mood:  Anxious and Dysphoric  Affect:  Congruent  Thought Process:  Linear and Descriptions of Associations: Circumstantial  Orientation:  Full (Time, Place, and Person)  Thought Content:  Hallucinations: None  Suicidal Thoughts:  No  Homicidal Thoughts:  No  Memory:  Immediate;   Good Recent;   Good Remote;   Good  Judgement:  Fair  Insight:  Fair  Psychomotor Activity:  Normal  Concentration:  Concentration: Fair and Attention Span: Fair  Recall:  Good  Fund of Knowledge:  Good  Language:  Good  Akathisia:  No  Handed:    AIMS (if indicated):     Assets:  Communication Skills Physical Health  ADL's:  Intact  Cognition:  WNL  Sleep:  Number of Hours: 6.25     Treatment Plan Summary: Daily contact with patient to assess and evaluate symptoms and progress in treatment and Medication management   56 year old single Caucasian male with major depressive disorder, OCD and severe IBS. Patient presented to our ER due to exacerbation of depression along with suicidal ideation. Appears that the  symptoms have worsened after he had stopped medications due to side effects.  Major depressive disorder patient has been started on  Nortriptyline. I we will increase the dose to 75 mg tonight.  OCD: Appears that part of his concern with the somatic issues is obsessive in nature. Likely part of obsessive-compulsive disorder. Pt does not have any compulsive behaviors that I can see. Seems consumed with preoccupation. Ruminates constantly and repeats same questions about his GI and sinus problems  Hypothyroidism continue Synthroid 25 g daily  Hypertension:  continue atenolol 25 mg po q day BP wnl  Dyslipidemia: will d/c crestor as pt says it worsens his GI problems  Hypokalemia: We will recheck potassium in a.m.  IBS: Has orders for Imodium when necessary that he has to show stool to nurses  Labs: TSH and B12 are wnl.   EKG: wnl  Precautions every 15 minute checks  Diet regular  Hospitalization status voluntary  Vital signs daily    Hildred Priest, MD 01/04/2017, 11:52 AM

## 2017-01-04 NOTE — Progress Notes (Signed)
D: Pt denies SI/HI/AVH. Pt is pleasant and cooperative, affect is flat and sad but brightens up upon approach. Patient denies pain or discomfort no distress noted. Pt appears less anxious and he is interacting with peers and staff appropriately.  A: Pt was offered support and encouragement. Pt was given scheduled medications. Pt was encouraged to attend groups. Q 15 minute checks were done for safety.  R:Pt attends groups and interacts well with peers and staff. Pt is taking medication. Pt has no complaints.Pt receptive to treatment and safety maintained on unit.

## 2017-01-04 NOTE — Progress Notes (Signed)
Patient was visible on the unit sitting in dayroom watching TV and interacting with select peers and staff. Patient reports that he is lonely and wants a girlfriend. Patient continue to complain of stomach issues and reported one episode of diarrhea. RN talked with patient that it is important to let the nurse see before flushing. The doctor also put an order in later this afternoon that nursing staff should look at stools before administering any Imodium. Patient verbalized understanding and will let nursing staff know if it happens again. Patient is alert and oriented x 4, breathing unlabored, and extremities x 4 within normal limits. Patient is calm and cooperative. Patient did not display any disruptive behavior.  Patient continues to be monitored on 15 minute safety checks. Will continue to monitor patient and notify MD of any changes.

## 2017-01-04 NOTE — [facility] Group Notes (Signed)
[place] LCSW Group Therapy 01/04/2017  1:00pm Mood: Good  Group Topic: Cognitive Distortions Pt response: Did not attend  August Saucer, LCSW 01/04/2017, 4:12 PM

## 2017-01-05 LAB — POTASSIUM: Potassium: 4.2 mmol/L (ref 3.5–5.1)

## 2017-01-05 NOTE — Progress Notes (Signed)
Pt visible on the periphery of the unit.  Upon approach focused on his bowels and finding a girl friend.  Discussed hi increase in Pamelor then asked staff if he can take Viagra with it.  Pt was encouraged to speak with MD about this.  Pt denies s/i, h/i or hallucinations.  Maintained safety while monitored on 15 minute safety checks.

## 2017-01-05 NOTE — Progress Notes (Signed)
[facility] MD Progress Note  01/05/2017 9:52 AM William Coleman.  MRN:  [id number] Subjective:  Patient is a 56 year old single Caucasian male with a history of depression, OCD, and IBS. Patient arrived to our emergency department on July 23 on the involuntary commitment from [place] due to suicidal ideation. IVC paperwork stated that pt has SI with plans to "step in front of moving vehicle or wreck vehicle".  Patient reports having a long history of depression and also GI problems. Patient is currently been follow-up by [facility] for depression and by GI for chronic diarrhea. The patient is states that he has been tried on multiple medications for depression but they've worsened his gastrointestinal issues. He has been without any medications for at least 2 months. At this time he is not taking any medications and has been referred for transcranial magnetic stimulation.   Patient says that he always has thoughts of suicide but they have worsened lately as his diarrhea has exacerbated. He also feels very lonely as he has not been in any romantic relationships are more than 4 years. Patient says that he has been trying dating websites but he has not had any success.  During assessment the patient was still depression, suicidality and hopelessness.  7/26 patient says that he feels about the same as he did yesterday, very anxious, focused on his somatic complaints, voices hopelessness. Today however he is not suicidal. He is slept well last night. Denies problems with appetite, energy or concentration. He says that his bowel movements have been normal. He had a large bowel movement this morning that he feels is secondary to the Crestor.  He eventually ended up terminating the assessment because he felt he needed to use the restroom  (BM)  7/27 patient continues to be focused on somatic complaints involving GI tract and sinus. Patient talks at length about the BM he had this morning. Continues to as the same  questions he has been asking since admission. He repeats himself multiple times, constantly ruminating about the somatic issues. Says he feels about the same as he did when he came in. Feels that a lot of his problems are caused by the physical issues. He is frustrated as the physical issues have not improved even though he has seen multiple doctors and tried multiple different medications.   7/28 patient reports that he spends a lot of time in the restroom  because he has to wipe himself. He says that his bowel movements are really messy and therefore he spends a lot of time in the bathroom. We talk about the obsessive nature of his thoughts and compulsive behaviors he is displaying. Patient says this is not OCD, he says his symptoms are real and they are the cause of his depression. Patient continues to ruminate about these things. He said he is not feeling much different. He says that he is spent hours in the afternoon the bathroom yesterday. He requested Imodium from the nurses. However nurses said that they would have to see that he is actually having diarrhea before giving it to him.  Once again very somatic, constantly asking about the side effects of medications, repeats the same questions and several statements that have been already reviewed and discussed with him in prior days.  7/29 patient denies any side effects from increased dose of nortriptyline. He has been is sleeping and eating well. Her mood is is still depressed but he is not as hopeless. He really hopes that nortriptyline will help  along with the [place]. For the last 2 days he has not had any diarrhea. Denies side effects from medications. Denies suicidality, homicidality or hallucinations. Was a slightly less somatic today   Per nursing: Pt visible on the periphery of the unit.  Upon approach focused on his bowels and finding a girl friend.  Discussed hi increase in Pamelor then asked staff if he can take Viagra with it.  Pt was  encouraged to speak with MD about this.  Pt denies s/i, h/i or hallucinations.  Maintained safety while monitored on 15 minute safety checks.  Principal Problem: Severe recurrent major depression without psychotic features ([place]) Diagnosis:   Patient Active Problem List   Diagnosis Date Noted  . OCD (obsessive compulsive disorder) [F42.9] 01/01/2017  . Severe recurrent major depression without psychotic features ([place]) [F33.2] 11/10/2015  . HLD (hyperlipidemia) [E78.5] 11/10/2014  . HTN (hypertension) [I10] 11/10/2014  . Adult hypothyroidism [E03.9] 11/10/2014  . 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 He has been tried on a multitude of SSRIs along with clomipramine all cause an exacerbation of GI symptoms. Patient denies history of suicidal attempts  Past Medical History:  F/u by Dr. Allen Norris for chronic diarrhea. On viberzi 100 mg po bid Past Medical History:  Diagnosis Date  . Anginal pain ([place])   . Bipolar disorder ([place])   . Chronic diarrhea   . Depression   . Hypertension   . Hypothyroidism   . Obsessive compulsive disorder   . Sleep apnea    patient states it was "marginal", no CPAP    Past Surgical History:  Procedure Laterality Date  . COLONOSCOPY WITH PROPOFOL N/A 02/05/2016   Procedure: COLONOSCOPY WITH PROPOFOL;  Surgeon: [name], MD;  Location: [place] [facility];  Service: Endoscopy;  Laterality: N/A;  . MOUTH SURGERY     orthodontic surgery for underbite  . MYRINGOTOMY WITH TUBE PLACEMENT    . NASAL SEPTUM SURGERY    . POLYPECTOMY N/A 02/05/2016   Procedure: POLYPECTOMY;  Surgeon: [name], MD;  Location: [place];  Service: Endoscopy;  Laterality: N/A;   Family History:  Family History  Problem Relation Age of Onset  . Depression Mother   . Anxiety disorder Mother   . Congestive  Heart Failure Father   . Prostate cancer Father   . Hypertension Father    Family Psychiatric  History: Patient reports that his mother suffered from anxiety. There is no history of substance abuse or suicide  Social History: Patient lives alone. He is single, never married doesn't have any children. He is working part-time in [employer] he has held this job for the last 3 years. History  Alcohol Use  . Yes    Comment: rarely     History  Drug Use No    Social History   Social History  . Marital status: Single    Spouse name: N/A  . Number of children: N/A  . Years of education: N/A   Social History Main Topics  . Smoking status: Never Smoker  . Smokeless tobacco: Never Used  . Alcohol use Yes     Comment: rarely  . Drug use: No  . Sexual activity: No   Other Topics Concern  . None   Social History Narrative  . None     Current Medications: Current Facility-Administered Medications  Medication  Dose Route Frequency Provider Last Rate Last Dose  . acetaminophen (TYLENOL) tablet 650 mg  650 mg Oral Q6H PRN Hildred Priest, MD   650 mg at 01/03/17 0825  . alum & mag hydroxide-simeth (MAALOX/MYLANTA) 200-200-20 MG/5ML suspension 30 mL  30 mL Oral Q4H PRN Hildred Priest, MD      . aspirin EC tablet 81 mg  81 mg Oral Daily Hildred Priest, MD   81 mg at 01/05/17 0816  . atenolol (TENORMIN) tablet 25 mg  25 mg Oral Daily Hildred Priest, MD   25 mg at 01/05/17 0816  . levothyroxine (SYNTHROID, LEVOTHROID) tablet 25 mcg  25 mcg Oral QAC breakfast Hildred Priest, MD   25 mcg at 01/05/17 [phone]  . loperamide (IMODIUM) capsule 2 mg  2 mg Oral BID PRN Hildred Priest, MD      . loratadine (CLARITIN) tablet 10 mg  10 mg Oral Daily Hildred Priest, MD   10 mg at 01/05/17 0816  . magnesium hydroxide (MILK OF MAGNESIA) suspension 30 mL  30 mL Oral Daily PRN Hildred Priest, MD      .  multivitamin with minerals tablet 1 tablet  1 tablet Oral Daily Hildred Priest, MD   1 tablet at 01/05/17 0816  . nortriptyline (PAMELOR) capsule 75 mg  75 mg Oral QHS Hildred Priest, MD   75 mg at 01/04/17 2132    Lab Results:  No results found for this or any previous visit (from the past 48 hour(s)).  Blood Alcohol level:  Lab Results  Component Value Date   [facility] <5 12/30/2016   ETH <5 33/54/5625    Metabolic Disorder Labs: Lab Results  Component Value Date   HGBA1C 5.1 11/14/2015   Lab Results  Component Value Date   PROLACTIN 6.8 11/14/2015   Lab Results  Component Value Date   CHOL 245 (H) 12/25/2015   TRIG 179 (H) 12/25/2015   HDL 43 12/25/2015   CHOLHDL 6.2 11/14/2015   VLDL 49 (H) 11/14/2015   LDLCALC 166 (H) 12/25/2015   LDLCALC 196 (H) 11/14/2015    Physical Findings: AIMS: Facial and Oral Movements Muscles of Facial Expression: None, normal Lips and Perioral Area: None, normal Jaw: None, normal Tongue: None, normal,Extremity Movements Upper (arms, wrists, hands, fingers): None, normal Lower (legs, knees, ankles, toes): None, normal, Trunk Movements Neck, shoulders, hips: None, normal, Overall Severity Severity of abnormal movements (highest score from questions above): None, normal Incapacitation due to abnormal movements: None, normal Patient's awareness of abnormal movements (rate only patient's report): No Awareness, Dental Status Current problems with teeth and/or dentures?: No Does patient usually wear dentures?: No  CIWA:    COWS:     Musculoskeletal: Strength & Muscle Tone: within normal limits Gait & Station: normal Patient leans: N/A  Psychiatric Specialty Exam: Physical Exam  Constitutional: He is oriented to person, place, and time. He appears well-developed and well-nourished.  HENT:  Head: Normocephalic and atraumatic.  Eyes: Conjunctivae and EOM are normal.  Neck: Normal range of motion.  Respiratory:  Effort normal.  Musculoskeletal: Normal range of motion.  Neurological: He is alert and oriented to person, place, and time.    Review of Systems  Constitutional: Negative.   HENT: Negative.   Eyes: Negative.   Respiratory: Negative.   Cardiovascular: Negative.   Gastrointestinal: Positive for diarrhea.       Large BM  Genitourinary: Negative.   Musculoskeletal: Negative.   Skin: Negative.   Neurological: Negative.   Endo/Heme/Allergies: Negative.   Psychiatric/Behavioral:  Positive for depression. Negative for hallucinations, memory loss, substance abuse and suicidal ideas. The patient is nervous/anxious. The patient does not have insomnia.     Blood pressure (!) 124/92, pulse 68, temperature 97.9 F (36.6 C), temperature source Oral, resp. rate 18, height 5\' 7"  (1.702 m), weight 68 kg (150 lb), SpO2 100 %.Body mass index is 23.49 kg/m.  General Appearance: Well Groomed  Eye Contact:  Good  Speech:  Clear and Coherent  Volume:  Normal  Mood:  Anxious and Dysphoric  Affect:  Congruent  Thought Process:  Linear and Descriptions of Associations: Circumstantial  Orientation:  Full (Time, Place, and Person)  Thought Content:  Hallucinations: None  Suicidal Thoughts:  No  Homicidal Thoughts:  No  Memory:  Immediate;   Good Recent;   Good Remote;   Good  Judgement:  Fair  Insight:  Fair  Psychomotor Activity:  Normal  Concentration:  Concentration: Fair and Attention Span: Fair  Recall:  Good  Fund of Knowledge:  Good  Language:  Good  Akathisia:  No  Handed:    AIMS (if indicated):     Assets:  Communication Skills Physical Health  ADL's:  Intact  Cognition:  WNL  Sleep:  Number of Hours: 6.25     Treatment Plan Summary: Daily contact with patient to assess and evaluate symptoms and progress in treatment and Medication management   56 year old single Caucasian male with major depressive disorder, OCD and severe IBS. Patient presented to our ER due to exacerbation of  depression along with suicidal ideation. Appears that the symptoms have worsened after he had stopped medications due to side effects.  Major depressive disorder patient has been started on  Nortriptyline. Dose of nortriptyline has been titrated up to 75 mg at bedtime  OCD: Appears that part of his concern with the somatic issues is obsessive in nature. Likely part of obsessive-compulsive disorder. Pt does does have some compulsive behaviors (excessive wiping). Seems consumed with preoccupation. Ruminates constantly and repeats same questions about his GI and sinus problems  Hypothyroidism continue Synthroid 25 g daily  Hypertension:  continue atenolol 25 mg po q day BP wnl  Dyslipidemia: will d/c crestor as pt says it worsens his GI problems  Hypokalemia: We will recheck potassium in a.m. Pending  IBS: Has orders for Imodium when necessary that he has to show stool to nurses  Labs: TSH and B12 are wnl.   EKG: wnl  Precautions every 15 minute checks  Diet regular  Hospitalization status voluntary  Vital signs daily  Possible d/c in the next 48 h  Hildred Priest, MD 01/05/2017, 9:52 AM

## 2017-01-05 NOTE — Progress Notes (Signed)
Verbalizes that depression is a little better.  Continues to be preoccupied with finding a girlfriend and his bowels.  Medication and group compliant.  Visible in the milieu.  Support offered.  Safety checks maintained.

## 2017-01-05 NOTE — [facility] Group Notes (Signed)
  [facility] LCSW Group Therapy  01/05/2017  1:00pm Group Topic:Thoughts and Feelings Therapeutic Goals: Utilize CBT and Affirmation cards to challenge cognitive distortions.  Pt's will be able to identify types of distortions and the feelings that they create for them.  Also demonstrate understanding of Affirmations and how they can be used to encourage more positive thinking style. Methods utilized: CBT, Affirmation cards Patient response: Pt participated minimally but able to meet therapeutic goals listed above   August Saucer, LCSW 01/05/2017, 4:07 PM

## 2017-01-05 NOTE — [facility] Group Notes (Signed)
[facility] Group Notes:  (Nursing/MHT/Case Management/Adjunct)  Date:  01/05/2017  Time:  7:12 AM  Type of Therapy:  Psychoeducational Skills  Participation Level:  Active  Participation Quality:  Appropriate and Attentive  Affect:  Appropriate  Cognitive:  Appropriate  Insight:  Appropriate and Good  Engagement in Group:  Engaged  Modes of Intervention:  Discussion, Socialization and Support  Summary of Progress/Problems:  Reece Agar 01/05/2017, 7:12 AM

## 2017-01-05 NOTE — Plan of Care (Signed)
Problem: Medication: Goal: Compliance with prescribed medication regimen will improve Outcome: Progressing Medication compliant   

## 2017-01-06 MED ORDER — NORTRIPTYLINE HCL 50 MG PO CAPS: 100.0000 mg | ORAL_CAPSULE | Freq: Every day | ORAL | 0 refills | Status: DC

## 2017-01-06 MED ORDER — NORTRIPTYLINE HCL 25 MG PO CAPS: 100.0000 mg | ORAL_CAPSULE | Freq: Every day | ORAL | 0 refills | Status: DC

## 2017-01-06 MED ORDER — LORATADINE 10 MG PO TABS: 10.0000 mg | ORAL_TABLET | Freq: Every day | ORAL | Status: DC

## 2017-01-06 MED ORDER — NORTRIPTYLINE HCL 25 MG PO CAPS
75.0000 mg | ORAL_CAPSULE | Freq: Every day | ORAL | Status: DC
  Administered 2017-01-06: 75 mg via ORAL
  Filled 2017-01-06 (×2): qty 3

## 2017-01-06 MED ORDER — ATENOLOL 25 MG PO TABS: 25.0000 mg | ORAL_TABLET | Freq: Every day | ORAL | 0 refills | Status: DC

## 2017-01-06 NOTE — Progress Notes (Signed)
Recreation Therapy Notes  Date: 07.30.18 Time: 1:00 pm Location: [facility]  Group Topic: Wellness  Goal Area(s) Addresses:  Patient will identify at least one item per dimension of health. Patient will examine areas they are deficient in.  Behavioral Response: Attentive, Interactive  Intervention: 6 Dimensions of Health  Activity: Patients were given a definition sheet defining each dimension of health. Patients were given a worksheet with each dimension listed and were instructed to write at least one item they were currently doing in each dimension.  Education: LRT educated patient on ways to improve each dimension.  Education Outcome: Acknowledges education/In group clarification offered  Clinical Observations/Feedback: Patient wrote at least one item in each dimension. Patient contributed to group discussion by stating what area he was not giving enough attention to, what area he was giving enough attention to, how to improve certain dimensions, how this activity relates to his discharge, and what would change for him if he was more aware of his wellness.  Leonette Monarch, LRT/CTRS 01/06/2017 1:37 PM

## 2017-01-06 NOTE — Progress Notes (Addendum)
Dictation #1 [id number]  [id number] [facility] MD Progress Note  01/06/2017 2:01 PM William Coleman.  MRN:  [id number] Subjective:  Patient is a 56 year old single Caucasian male with a history of depression, OCD, and IBS. Patient arrived to our emergency department on July 23 on the involuntary commitment from [place] due to suicidal ideation. IVC paperwork stated that pt has SI with plans to "step in front of moving vehicle or wreck vehicle".  Patient reports having a long history of depression and also GI problems. Patient is currently been follow-up by [facility] for depression and by GI for chronic diarrhea. The patient states that he has been tried on multiple medications for depression but they've worsened his gastrointestinal issues. He has been without any medications for at least 2 months. At this time he is not taking any medications and has been referred for transcranial magnetic stimulation.   Patient says that he always has thoughts of suicide but they have worsened lately as his diarrhea has exacerbated. He also feels very lonely as he has not been in any romantic relationships for more than 4 years. Patient says that he has been trying dating websites but he has not had any success.  During assessment the patient was still depression, suicidality, and hopelessness.  7/26 patient says that he feels about the same as he did yesterday, very anxious, focused on his somatic complaints, voices hopelessness. Today however he is not suicidal. He slept well last night. Denies problems with appetite, energy or concentration. He says that his bowel movements have been normal. He had a large bowel movement this morning that he feels is secondary to the Crestor.  He eventually ended up terminating the assessment because he felt he needed to use the restroom  (BM)  7/27 patient continues to be focused on somatic complaints involving GI tract and sinus. Patient talks at length about the BM he had  this morning. Continues to ask the same questions he has been asking since admission. He repeats himself multiple times, constantly ruminating about the somatic issues. Says he feels about the same as he did when he came in. Feels that a lot of his problems are caused by the physical issues. He is frustrated as the physical issues have not improved even though he has seen multiple doctors and tried multiple different medications.   7/28 patient reports that he spends a lot of time in the restroom  because he has to wipe himself. He says that his bowel movements are really messy and therefore he spends a lot of time in the bathroom. We talk about the obsessive nature of his thoughts and compulsive behaviors he is displaying. Patient says this is not OCD, he says his symptoms are real and they are the cause of his depression. Patient continues to ruminate about these things. He said he is not feeling much different. He says that he is spent hours in the afternoon the bathroom yesterday. He requested Imodium from the nurses. However nurses said that they would have to see that he is actually having diarrhea before giving it to him.  Once again very somatic, constantly asking about the side effects of medications, repeats the same questions and several statements that have been already reviewed and discussed with him in prior days.  7/29 patient denies any side effects from increased dose of nortriptyline. He has been sleeping and eating well. His mood is still depressed but he is not as hopeless. He really hopes that nortriptyline will  help along with the [place]. For the last 2 days he has not had any diarrhea. Denies side effects from medications. Denies suicidality, homicidality or hallucinations. Was a slightly less somatic today  7/30 Patient reports increased drowsiness/sedation, which he believes is secondary to the Nortriptyline. He felt bloated after lunch and attempted to have a bowel movement without  success. Patient also reports that his sinuses continue to bother him. He is concerned about transportation following discharge tomorrow. Patient is anxious about the potential for rain and weather tomorrow, as he does not want to walk from a bus stop to his car in the rain. Denies suicidal/homicidal ideations, and hallucinations.   Per nursing: [name] that depression is a little better.  Continues to be preoccupied with finding a girlfriend and his bowels.  Medication and group compliant.  Visible in the milieu.  Support offered.  Safety checks maintained.  Principal Problem: Severe recurrent major depression without psychotic features ([place]) Diagnosis:   Patient Active Problem List   Diagnosis Date Noted  . OCD (obsessive compulsive disorder) [F42.9] 01/01/2017  . Severe recurrent major depression without psychotic features ([place]) [F33.2] 11/10/2015  . HLD (hyperlipidemia) [E78.5] 11/10/2014  . HTN (hypertension) [I10] 11/10/2014  . Adult hypothyroidism [E03.9] 11/10/2014  . 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 He has been tried on a multitude of SSRIs along with clomipramine all cause an exacerbation of GI symptoms. Patient denies history of suicidal attempts  Past Medical History:  F/u by Dr. Allen Norris for chronic diarrhea. On viberzi 100 mg po bid Past Medical History:  Diagnosis Date  . Anginal pain ([place])   . Bipolar disorder ([place])   . Chronic diarrhea   . Depression   . Hypertension   . Hypothyroidism   . Obsessive compulsive disorder   . Sleep apnea    patient states it was "marginal", no CPAP    Past Surgical History:  Procedure Laterality Date  . COLONOSCOPY WITH PROPOFOL N/A 02/05/2016   Procedure: COLONOSCOPY WITH PROPOFOL;  Surgeon: [name], MD;  Location: [place];  Service: Endoscopy;   Laterality: N/A;  . MOUTH SURGERY     orthodontic surgery for underbite  . MYRINGOTOMY WITH TUBE PLACEMENT    . NASAL SEPTUM SURGERY    . POLYPECTOMY N/A 02/05/2016   Procedure: POLYPECTOMY;  Surgeon: [name], MD;  Location: [facility];  Service: Endoscopy;  Laterality: N/A;   Family History:  Family History  Problem Relation Age of Onset  . Depression Mother   . Anxiety disorder Mother   . Congestive Heart Failure Father   . Prostate cancer Father   . Hypertension Father    Family Psychiatric  History: Patient reports that his mother suffered from anxiety. There is no history of substance abuse or suicide  Social History: Patient lives alone. He is single, never married doesn't have any children. He is working part-time in [employer] he has held this job for the last 3 years. History  Alcohol Use  . Yes    Comment: rarely     History  Drug Use No    Social History   Social History  . Marital status: Single    Spouse name: N/A  . Number of children: N/A  . Years of education: N/A   Social History Main Topics  . Smoking status: Never Smoker  .  Smokeless tobacco: Never Used  . Alcohol use Yes     Comment: rarely  . Drug use: No  . Sexual activity: No   Other Topics Concern  . None   Social History Narrative  . None     Current Medications: Current Facility-Administered Medications  Medication Dose Route Frequency Provider Last Rate Last Dose  . acetaminophen (TYLENOL) tablet 650 mg  650 mg Oral Q6H PRN Hildred Priest, MD   650 mg at 01/03/17 0825  . alum & mag hydroxide-simeth (MAALOX/MYLANTA) 200-200-20 MG/5ML suspension 30 mL  30 mL Oral Q4H PRN Hildred Priest, MD      . aspirin EC tablet 81 mg  81 mg Oral Daily Hildred Priest, MD   81 mg at 01/06/17 0844  . atenolol (TENORMIN) tablet 25 mg  25 mg Oral Daily Hildred Priest, MD   25 mg at 01/06/17 0844  . levothyroxine (SYNTHROID, LEVOTHROID)  tablet 25 mcg  25 mcg Oral QAC breakfast Hildred Priest, MD   25 mcg at 01/06/17 [phone]  . loperamide (IMODIUM) capsule 2 mg  2 mg Oral BID PRN Hildred Priest, MD      . loratadine (CLARITIN) tablet 10 mg  10 mg Oral Daily Hildred Priest, MD   10 mg at 01/06/17 0844  . magnesium hydroxide (MILK OF MAGNESIA) suspension 30 mL  30 mL Oral Daily PRN Hildred Priest, MD      . multivitamin with minerals tablet 1 tablet  1 tablet Oral Daily Hildred Priest, MD   1 tablet at 01/06/17 0844  . nortriptyline (PAMELOR) capsule 75 mg  75 mg Oral QHS Hildred Priest, MD        Lab Results:  Results for orders placed or performed during the hospital encounter of 12/31/16 (from the past 48 hour(s))  Potassium     Status: None   Collection Time: 01/05/17 11:21 AM  Result Value Ref Range   Potassium 4.2 3.5 - 5.1 mmol/L    Blood Alcohol level:  Lab Results  Component Value Date   [place] <5 12/30/2016   ETH <5 73/71/0626    Metabolic Disorder Labs: Lab Results  Component Value Date   HGBA1C 5.1 11/14/2015   Lab Results  Component Value Date   PROLACTIN 6.8 11/14/2015   Lab Results  Component Value Date   CHOL 245 (H) 12/25/2015   TRIG 179 (H) 12/25/2015   HDL 43 12/25/2015   CHOLHDL 6.2 11/14/2015   VLDL 49 (H) 11/14/2015   LDLCALC 166 (H) 12/25/2015   LDLCALC 196 (H) 11/14/2015    Physical Findings: AIMS: Facial and Oral Movements Muscles of Facial Expression: None, normal Lips and Perioral Area: None, normal Jaw: None, normal Tongue: None, normal,Extremity Movements Upper (arms, wrists, hands, fingers): None, normal Lower (legs, knees, ankles, toes): None, normal, Trunk Movements Neck, shoulders, hips: None, normal, Overall Severity Severity of abnormal movements (highest score from questions above): None, normal Incapacitation due to abnormal movements: None, normal Patient's awareness of abnormal movements (rate  only patient's report): No Awareness, Dental Status Current problems with teeth and/or dentures?: No Does patient usually wear dentures?: No  CIWA:    COWS:     Musculoskeletal: Strength & Muscle Tone: within normal limits Gait & Station: normal Patient leans: N/A  Psychiatric Specialty Exam: Physical Exam  Constitutional: He is oriented to person, place, and time. He appears well-developed and well-nourished.  HENT:  Head: Normocephalic and atraumatic.  Eyes: Conjunctivae and EOM are normal.  Neck: Normal range of motion.  Respiratory: Effort normal.  Musculoskeletal: Normal range of motion.  Neurological: He is alert and oriented to person, place, and time.    Review of Systems  Constitutional: Negative.   HENT: Positive for congestion.   Eyes: Negative.   Respiratory: Negative.   Cardiovascular: Negative.   Gastrointestinal:       Feeling bloated  Genitourinary: Negative.   Musculoskeletal: Negative.   Skin: Negative.   Neurological: Negative.   Endo/Heme/Allergies: Negative.   Psychiatric/Behavioral: Positive for depression. Negative for hallucinations, memory loss, substance abuse and suicidal ideas. The patient is nervous/anxious. The patient does not have insomnia.     Blood pressure 129/88, pulse 67, temperature 97.6 F (36.4 C), temperature source Oral, resp. rate 18, height 5\' 7"  (1.702 m), weight 68 kg (150 lb), SpO2 100 %.Body mass index is 23.49 kg/m.  General Appearance: Well Groomed  Eye Contact:  Good  Speech:  Clear and Coherent  Volume:  Normal  Mood:  Anxious and Dysphoric  Affect:  Congruent  Thought Process:  Linear and Descriptions of Associations: Circumstantial  Orientation:  Full (Time, Place, and Person)  Thought Content:  Hallucinations: None  Suicidal Thoughts:  No  Homicidal Thoughts:  No  Memory:  Immediate;   Good Recent;   Good Remote;   Good  Judgement:  Fair  Insight:  Fair  Psychomotor Activity:  Normal  Concentration:   Concentration: Fair and Attention Span: Fair  Recall:  Good  Fund of Knowledge:  Good  Language:  Good  Akathisia:  No  Handed:    AIMS (if indicated):     Assets:  Communication Skills Physical Health  ADL's:  Intact  Cognition:  WNL  Sleep:  Number of Hours: 4     Treatment Plan Summary: Daily contact with patient to assess and evaluate symptoms and progress in treatment and Medication management   56 year old single Caucasian male with major depressive disorder, OCD, and severe IBS. Patient presented to the ER due to exacerbation of depression along with suicidal ideation. Appears that the symptoms have worsened after he had stopped medications due to side effects.  Major depressive disorder: patient has been started on Nortriptyline. Dose of nortriptyline has been titrated up to 75 mg at bedtime IP and will be d/c with 25mg  capsules, x4 capsules at bedtime. Sedation is likley due to poor sleep and not the medication---per nursing staff he only slept 4 h  OCD: Appears that part of his concern with the somatic issues is obsessive in nature. Likely part of obsessive-compulsive disorder. Pt does does have some compulsive behaviors (excessive wiping). Seems consumed with preoccupation. Ruminates constantly and repeats same questions about his GI and sinus problems  Hypothyroidism: TSH wnl 01/01/17 (0.432), will continue Synthroid 25 g daily  Hypertension: BP wnl (129/88), will continue atenolol 25 mg po q day  Dyslipidemia: Will d/c crestor as pt says it worsens his GI problems  Hypokalemia: Potassium wnl (4.2) on 01/05/17  IBS: Has orders for Imodium when necessary that he has to show stool to nurses  Congestion: Continue with Loratadine 10mg  tablet for discharge home  Labs: TSH and B12 are wnl.   EKG: wnl  Precautions every 15 minute checks  Diet regular  Hospitalization status voluntary  Vital signs daily  D/c in the next 24 h  Hildred Priest, MD 01/06/2017, 2:01 PM

## 2017-01-06 NOTE — Tx Team (Signed)
Interdisciplinary Treatment and Diagnostic Plan Update  01/06/2017 Time of Session: [address] [place]. MRN: [id number]  Principal Diagnosis: Severe recurrent major depression without psychotic features ([place])  Secondary Diagnoses: Principal Problem:   Severe recurrent major depression without psychotic features ([place]) Active Problems:   HLD (hyperlipidemia)   HTN (hypertension)   Adult hypothyroidism   Adaptive colitis   OCD (obsessive compulsive disorder)   Current Medications:  Current Facility-Administered Medications  Medication Dose Route Frequency Provider Last Rate Last Dose  . acetaminophen (TYLENOL) tablet 650 mg  650 mg Oral Q6H PRN Hildred Priest, MD   650 mg at 01/03/17 0825  . alum & mag hydroxide-simeth (MAALOX/MYLANTA) 200-200-20 MG/5ML suspension 30 mL  30 mL Oral Q4H PRN Hildred Priest, MD      . aspirin EC tablet 81 mg  81 mg Oral Daily Hildred Priest, MD   81 mg at 01/06/17 0844  . atenolol (TENORMIN) tablet 25 mg  25 mg Oral Daily Hildred Priest, MD   25 mg at 01/06/17 0844  . levothyroxine (SYNTHROID, LEVOTHROID) tablet 25 mcg  25 mcg Oral QAC breakfast Hildred Priest, MD   25 mcg at 01/06/17 [phone]  . loperamide (IMODIUM) capsule 2 mg  2 mg Oral BID PRN Hildred Priest, MD      . loratadine (CLARITIN) tablet 10 mg  10 mg Oral Daily Hildred Priest, MD   10 mg at 01/06/17 0844  . magnesium hydroxide (MILK OF MAGNESIA) suspension 30 mL  30 mL Oral Daily PRN Hildred Priest, MD      . multivitamin with minerals tablet 1 tablet  1 tablet Oral Daily Hildred Priest, MD   1 tablet at 01/06/17 0844  . nortriptyline (PAMELOR) capsule 75 mg  75 mg Oral QHS Hildred Priest, MD   75 mg at 01/05/17 2111   PTA Medications: Prescriptions Prior to Admission  Medication Sig Dispense Refill Last Dose  . acetaminophen (TYLENOL) 500 MG tablet Take 500 mg by  mouth every 6 (six) hours as needed for mild pain or moderate pain.    PRN at PRN  . aspirin 81 MG tablet Take 81 mg by mouth daily.   unknown at unknown  . Eluxadoline (VIBERZI) 75 MG TABS Take 75 mg by mouth daily. 30 tablet 1 unknown at unknown  . fexofenadine (ALLEGRA) 180 MG tablet Take 180 mg by mouth daily.   unknown at unknown  . hydrochlorothiazide (HYDRODIURIL) 25 MG tablet Take 1 tablet (25 mg total) by mouth daily. 30 tablet 12 unknown at unknown  . levothyroxine (SYNTHROID, LEVOTHROID) 25 MCG tablet Take 1 tablet (25 mcg total) by mouth daily. 90 tablet 0 unknown at unknown  . Multiple Vitamin (MULTIVITAMIN) tablet Take 1 tablet by mouth daily.   unknown at unknown  . potassium chloride SA (KLOR-CON M20) 20 MEQ tablet Take 1 tablet (20 mEq total) by mouth 2 (two) times daily. 28 tablet 0 unknown at unknown  . rosuvastatin (CRESTOR) 10 MG tablet Take 1 tablet (10 mg total) by mouth daily. 30 tablet 11 unknown at unknown  . VIAGRA 50 MG tablet Take 50 mg by mouth as needed for erectile dysfunction.   1 PRN at PRN  . vitamin C (ASCORBIC ACID) 500 MG tablet Take 500 mg by mouth daily.   unknown at unknown    Patient Stressors: Health problems Other: loniliness  Patient Strengths: Ability for insight Curator fund of knowledge Motivation for treatment/growth  Treatment Modalities: Medication Management, Group therapy, Case management,  1 to 1 session with clinician, Psychoeducation, Recreational therapy.   Physician Treatment Plan for Primary Diagnosis: Severe recurrent major depression without psychotic features ([place]) Long Term Goal(s): Improvement in symptoms so as ready for discharge Improvement in symptoms so as ready for discharge   Short Term Goals: Ability to identify changes in lifestyle to reduce recurrence of condition will improve Ability to verbalize feelings will improve Ability to demonstrate self-control will improve Ability to identify and  develop effective coping behaviors will improve Ability to identify triggers associated with substance abuse/mental health issues will improve  Medication Management: Evaluate patient's response, side effects, and tolerance of medication regimen.  Therapeutic Interventions: 1 to 1 sessions, Unit Group sessions and Medication administration.  Evaluation of Outcomes: Progressing  Physician Treatment Plan for Secondary Diagnosis: Principal Problem:   Severe recurrent major depression without psychotic features ([place]) Active Problems:   HLD (hyperlipidemia)   HTN (hypertension)   Adult hypothyroidism   Adaptive colitis   OCD (obsessive compulsive disorder)  Long Term Goal(s): Improvement in symptoms so as ready for discharge Improvement in symptoms so as ready for discharge   Short Term Goals: Ability to identify changes in lifestyle to reduce recurrence of condition will improve Ability to verbalize feelings will improve Ability to demonstrate self-control will improve Ability to identify and develop effective coping behaviors will improve Ability to identify triggers associated with substance abuse/mental health issues will improve     Medication Management: Evaluate patient's response, side effects, and tolerance of medication regimen.  Therapeutic Interventions: 1 to 1 sessions, Unit Group sessions and Medication administration.  Evaluation of Outcomes: Progressing   RN Treatment Plan for Primary Diagnosis: Severe recurrent major depression without psychotic features ([place]) Long Term Goal(s): Knowledge of disease and therapeutic regimen to maintain health will improve  Short Term Goals: Ability to verbalize feelings will improve, Ability to disclose and discuss suicidal ideas, Ability to identify and develop effective coping behaviors will improve and Compliance with prescribed medications will improve  Medication Management: RN will administer medications as ordered by provider,  will assess and evaluate patient's response and provide education to patient for prescribed medication. RN will report any adverse and/or side effects to prescribing provider.  Therapeutic Interventions: 1 on 1 counseling sessions, Psychoeducation, Medication administration, Evaluate responses to treatment, Monitor vital signs and CBGs as ordered, Perform/monitor CIWA, COWS, AIMS and Fall Risk screenings as ordered, Perform wound care treatments as ordered.  Evaluation of Outcomes: Progressing   LCSW Treatment Plan for Primary Diagnosis: Severe recurrent major depression without psychotic features ([place]) Long Term Goal(s): Safe transition to appropriate next level of care at discharge, Engage patient in therapeutic group addressing interpersonal concerns.  Short Term Goals: Engage patient in aftercare planning with referrals and resources, Increase social support and Increase skills for wellness and recovery  Therapeutic Interventions: Assess for all discharge needs, 1 to 1 time with Social worker, Explore available resources and support systems, Assess for adequacy in community support network, Educate family and significant other(s) on suicide prevention, Complete Psychosocial Assessment, Interpersonal group therapy.  Evaluation of Outcomes: Progressing   Progress in Treatment: Attending groups: Yes. Participating in groups: Yes. Taking medication as prescribed: Yes. Toleration medication: Yes. Family/Significant other contact made: Yes, individual(s) contacted:  sister Patient understands diagnosis: Yes. Discussing patient identified problems/goals with staff: Yes. Medical problems stabilized or resolved: Yes. Denies suicidal/homicidal ideation: Yes. Issues/concerns per patient self-inventory: No. Other: none  New problem(s) identified: No, Describe:  none  New Short Term/Long Term Goal(s): Pt  goal is "can you make these problems go away?"  Discharge Plan or Barriers: Pt will  return to outpt services at [facility].  Reason for Continuation of Hospitalization: Depression Medication stabilization  Estimated Length of Stay: 1 day.  Attendees: Patient: 01/06/2017   Physician: Dr. Jerilee Hoh, MD 01/06/2017   Nursing: Elige Radon, RN 01/06/2017   RN Care Manager: 01/06/2017   Social Worker: Lurline Idol, LCSW 01/06/2017   Recreational Therapist: Everitt Amber, LRT/CTRS  01/06/2017   Other:  01/06/2017   Other:  01/06/2017   Other: 01/06/2017            Scribe for Treatment Team: Joanne Chars, LCSW 01/06/2017 11:54 AM

## 2017-01-06 NOTE — Plan of Care (Signed)
Problem: Medication: Goal: Compliance with prescribed medication regimen will improve Outcome: Progressing Medication compliant   

## 2017-01-06 NOTE — Progress Notes (Signed)
Rates depression as 5/10.  Denies SI/HI/AVH.  Verbalizes that he feels tired and sleepy and believes that his nortryptalline is to much.  Further states that he will not be able to work if he feels drowsy like this once he is discharged and he is suppose to be discharged tomorrow.  Tangential in speech, intrusive at times.  Support offered.  Safety maintained.

## 2017-01-06 NOTE — [facility] Group Notes (Signed)
[facility] LCSW Group Therapy   01/06/2017 9:30 am Type of Therapy: Group Therapy   Participation Level: Patient invited but did not attend.   Glorious Peach, MSW, LCSW-A 01/06/2017, 11:51 AM

## 2017-01-06 NOTE — [facility] Group Notes (Signed)
[facility] Group Notes:  (Nursing/MHT/Case Management/Adjunct)  Date:  01/06/2017  Time:  8:59 PM  Type of Therapy:  Group Therapy  Participation Level:  Active  Participation Quality:  Appropriate  Affect:  Appropriate  Cognitive:  Appropriate  Insight:  Appropriate  Engagement in Group:  Engaged  Modes of Intervention:  Activity  Summary of Progress/Problems:  William Coleman 01/06/2017, 8:59 PM

## 2017-01-07 NOTE — Progress Notes (Signed)
Pt denies SI, HI, a/v hallucinations. Pt commits to safety for discharge. Follow up appointments, discharge medications, and treatment reviewed. Pt valuables (money) $148 returned. Pt verbalized understanding of discharge instructions. All belongings returned to pt upon discharge. Pt safely d/c via cab home.

## 2017-01-07 NOTE — BHH Suicide Risk Assessment (Signed)
[facility] Discharge Suicide Risk Assessment   Principal Problem: Severe recurrent major depression without psychotic features [facility] Discharge Diagnoses:  Patient Active Problem List   Diagnosis Date Noted  . OCD (obsessive compulsive disorder) [F42.9] 01/01/2017  . Severe recurrent major depression without psychotic features ([place]) [F33.2] 11/10/2015  . HLD (hyperlipidemia) [E78.5] 11/10/2014  . HTN (hypertension) [I10] 11/10/2014  . Adult hypothyroidism [E03.9] 11/10/2014  . Adaptive colitis [K58.9] 11/10/2014     Psychiatric Specialty Exam: ROS  Blood pressure 126/80, pulse 70, temperature 98 F (36.7 C), temperature source Oral, resp. rate 18, height 5\' 7"  (1.702 m), weight 68 kg (150 lb), SpO2 100 %.Body mass index is 23.49 kg/m.                                                       Mental Status Per Nursing Assessment::   On Admission:     Demographic Factors:  Male and Caucasian  Loss Factors: Decline in physical health  Historical Factors: Impulsivity  Risk Reduction Factors:   Sense of responsibility to family, Employed and Positive social support  Continued Clinical Symptoms:  Depression:   Severe More than one psychiatric diagnosis Previous Psychiatric Diagnoses and Treatments  Cognitive Features That Contribute To Risk:  None    Suicide Risk:  Minimal: No identifiable suicidal ideation.  Patients presenting with no risk factors but with morbid ruminations; may be classified as minimal risk based on the severity of the depressive symptoms  Follow-up Information    [name], MD. Schedule an appointment as soon as possible for a visit.   Specialty:  Gastroenterology Why:  gantroenterologist  Contact information: [place]  [place] [address] [phone]        Jerrol Banana., MD. Schedule an appointment as soon as possible for a visit.   Specialty:  Family Medicine Why:  f/u with Primary care Contact  information: [address] [name] [address] [place] on 01/09/2017.   Why:  Please attend your follow up appointment at [facility] on Thursday, 01/09/17, at 12:30pm.  Please bring a copy of your hospital discharge paperwork. Contact information: [place] [address] [phone]             Hildred Priest, MD 01/07/2017, 9:54 AM

## 2017-01-07 NOTE — Progress Notes (Signed)
Recreation Therapy Notes  Date: 07.31.18 Time: 9:30 am Location: Craft Room  Group Topic: Coping Skills  Goal Area(s) Addresses:  Patient will verbalize importance of recognizing emotions. Patient will identify at least one emotion. Patient will identify healthy coping skills for emotions listed.  Behavioral Response: Did not attend  Intervention: Emotion Wheel  Activity: Patients were given an Emotion Wheel worksheet and were instructed as a group to come up with 8 emotions they feel in the process of recovery. After they had identified 8 emotions, patients were instructed to write healthy coping skills to help them cope with that emotion.  Education: LRT educated patients on healthy coping skills.  Education Outcome: Patient did not attend group.   Clinical Observations/Feedback: Patient did not attend group.  Leonette Monarch, LRT/CTRS 01/07/2017 10:03 AM

## 2017-01-07 NOTE — Progress Notes (Signed)
  [facility] Adult Case Management Discharge Plan :  Will you be returning to the same living situation after discharge:  Yes,  own home At discharge, do you have transportation home?: No. Bus pass provided. Do you have the ability to pay for your medications: Yes,  BCBS  Release of information consent forms completed and in the chart;  Patient's signature needed at discharge.  Patient to Follow up at: Follow-up Information    [name], MD. Schedule an appointment as soon as possible for a visit.   Specialty:  Gastroenterology Why:  gantroenterologist  Contact information: [place]  [place] [address] [phone]        Jerrol Banana., MD. Schedule an appointment as soon as possible for a visit.   Specialty:  Family Medicine Why:  f/u with Primary care Contact information: [address] [place] [address] [address] on 01/09/2017.   Why:  Please attend your follow up appointment at [facility] on Thursday, 01/09/17, at 12:30pm.  Please bring a copy of your hospital discharge paperwork. Contact information: [place] [address] [phone]           Next level of care provider has access to [place] and Suicide Prevention discussed: Yes,  with sister  Have you used any form of tobacco in the last 30 days? (Cigarettes, Smokeless Tobacco, Cigars, and/or Pipes): No  Has patient been referred to the Quitline?: N/A patient is not a smoker  Patient has been referred for addiction treatment: Yes  Joanne Chars, LCSW 01/07/2017, 9:45 AM

## 2017-01-07 NOTE — Progress Notes (Signed)
  January 07, 2017 Patient:  William Coleman. is a 56 y.o., male MRN:  [id number] DOB:  1960/07/21 Patient phone:  [phone] (home)          Patient address:   [address] [place] [address],       To whom it may concern:    This patient was hospitalized at [facility] from 12/30/16 to 01/07/17. This patient has been medically cleared and can return to work without restrictions on 08/01. If more information is needed about this case, please do not hesitate to contact me at [phone].       Merlyn Albert MD [facility] [place], [place],  [address]

## 2017-01-07 NOTE — Progress Notes (Signed)
D: Pt denies SI/HI/AVH. Pt is pleasant and cooperative, affect is brighter he appears less anxious and he is interacting with peers and staff appropriately. Patient thoughts are organized, he stated he feels better, he was less intrusive or preoccupied with his feelings of loneliness  A: Pt was offered support and encouragement. Pt was given scheduled medications. Pt was encouraged to attend groups. Q 15 minute checks were done for safety.  R:Pt attends groups and interacts well with peers and staff. Pt is taking medication. Pt receptive to treatment and safety maintained on unit.

## 2017-01-07 NOTE — Plan of Care (Signed)
Problem: Coping: Goal: Ability to cope will improve Outcome: Progressing Patient is coping on the unit.

## 2017-01-07 NOTE — Discharge Summary (Signed)
Physician Discharge Summary Note  Patient:  William Coleman. is an 56 y.o., male MRN:  [id number] DOB:  01/28/1961 Patient phone:  [phone] (home)  Patient address:   [address] [place] [address],  Total Time spent with patient: 30 minutes  Date of Admission:  12/31/2016 Date of Discharge: 7//31/18  Reason for Admission:  SI  Principal Problem: Severe recurrent major depression without psychotic features [facility]) Discharge Diagnoses: Patient Active Problem List   Diagnosis Date Noted  . OCD (obsessive compulsive disorder) [F42.9] 01/01/2017  . Severe recurrent major depression without psychotic features ([place]) [F33.2] 11/10/2015  . HLD (hyperlipidemia) [E78.5] 11/10/2014  . HTN (hypertension) [I10] 11/10/2014  . Adult hypothyroidism [E03.9] 11/10/2014  . Adaptive colitis [K58.9] 11/10/2014    History of Present Illness:   Patient is a 56 year old single Caucasian male with a history of depression, OCD, and IBS. Patient arrived to our emergency department on July 23 on the involuntary commitment from [place] due to suicidal ideation. IVC paperwork stated that pt has SI with plans to "step in front of moving vehicle or wreck vehicle".  Patient reports having a long history of depression and also GI problems. Patient is currently been follow-up by [facility] for depression and by GI for chronic diarrhea. The patient is states that he has been tried on multiple medications for depression but they've worsened his gastrointestinal issues. He has been without any medications for at least 2 months. At this time he is not taking any medications and has been referred for transcranial magnetic stimulation.   Patient says that he always has thoughts of suicide but they have worsened lately as his diarrhea has exacerbated. He also feels very lonely as he has not been in any romantic relationships are more than 4 years. Patient says that he has been trying dating websites but he  has not had any success.  During assessment the patient was still depression, suicidality and hopelessness.  Patient denies history of prior suicidal attempts. He was hospitalized here about a year ago due to depression and suicidality. He has been diagnosed with OCD. He says that the OCD symptoms have decreased over the years. He reported some compulsive checking.  Trauma history denies  Substance abuse history denies  Associated Signs/Symptoms: Depression Symptoms:  depressed mood, hypersomnia, fatigue, feelings of worthlessness/guilt, recurrent thoughts of death, (Hypo) Manic Symptoms:  Distractibility, Anxiety Symptoms:  Excessive Worry, Psychotic Symptoms:  denies PTSD Symptoms: Had a traumatic exposure:  denies Total Time spent with patient: 1 hour  Past Psychiatric History: One prior psychiatric hospitalization in our facility a year ago for depression and suicidality. He has been tried on a multitude of SSRIs along with clomipramine all cause an exacerbation of GI symptoms. Patient denies history of suicidal attempts  Past Medical History:  Past Medical History:  Diagnosis Date  . Anginal pain ([place])   . Bipolar disorder ([place])   . Chronic diarrhea   . Depression   . Hypertension   . Hypothyroidism   . Obsessive compulsive disorder   . Sleep apnea    patient states it was "marginal", no CPAP    Past Surgical History:  Procedure Laterality Date  . COLONOSCOPY WITH PROPOFOL N/A 02/05/2016   Procedure: COLONOSCOPY WITH PROPOFOL;  Surgeon: [name], MD;  Location: [facility];  Service: Endoscopy;  Laterality: N/A;  . MOUTH SURGERY     orthodontic surgery for underbite  . MYRINGOTOMY WITH TUBE PLACEMENT    . NASAL  SEPTUM SURGERY    . POLYPECTOMY N/A 02/05/2016   Procedure: POLYPECTOMY;  Surgeon: [name], MD;  Location: [facility];  Service: Endoscopy;  Laterality: N/A;   Family History:  Family History  Problem Relation Age of Onset  .  Depression Mother   . Anxiety disorder Mother   . Congestive Heart Failure Father   . Prostate cancer Father   . Hypertension Father    Family Psychiatric  History: Patient reports that his mother suffered from anxiety. There is no history of substance abuse or suicide  Social History: Patient lives alone. He is single, never married doesn't have any children. He is working part-time in [employer] he has held this job for the last 3 years. History  Alcohol Use  . Yes    Comment: rarely     History  Drug Use No    Social History   Social History  . Marital status: Single    Spouse name: N/A  . Number of children: N/A  . Years of education: N/A   Social History Main Topics  . Smoking status: Never Smoker  . Smokeless tobacco: Never Used  . Alcohol use Yes     Comment: rarely  . Drug use: No  . Sexual activity: No   Other Topics Concern  . None   Social History Narrative  . None    Hospital Course:    56 year old single Caucasian male with major depressive disorder, OCD and severe IBS. Patient presented to our ER due to exacerbation of depression along with suicidal ideation. Appears that the symptoms have worsened after he had stopped medications due to side effects.  Major depressive disorder patient has been started on  Nortriptyline. Dose of nortriptyline has been titrated up to 75 mg at bedtime  OCD: Appears that part of his concern with the somatic issues is obsessive in nature. Likely part of obsessive-compulsive disorder. Pt does does have some compulsive behaviors (excessive wiping). Seems consumed with preoccupation. Ruminates constantly and repeats same questions about his GI and sinus problems  Hypothyroidism continue Synthroid 25 g daily  Hypertension:  continue atenolol 25 mg po q day BP wnl  Dyslipidemia: will d/c crestor as pt says it worsens his GI problems  Hypokalemia: We will recheck potassium in a.m. Pending  IBS: Has orders for  Imodium when necessary that he has to show stool to nurses  Labs: TSH and B12 are wnl.   EKG: wnl  Throughout the hospitalization and he was overly concerned about bowel movements. He was very somatic. He repeated himself multiple times as similar questions several times.  Today he is not longer reporting severe depression or suicidal ideation. He denies having any hopelessness or helplessness. He denies major problems with sleep, appetite, energy or concentration. He is tolerating medications well. His only concern is some mild sedation however for the last 2 nights the patient has not slept that well. He denies any physical complaints.  He participated actively in groups. Staff working with the patient feels he is a stable and ready for discharge. He did not voice any concern about his safety and the safety of others.  Patient has denied having any access to guns  Physical Findings: AIMS: Facial and Oral Movements Muscles of Facial Expression: None, normal Lips and Perioral Area: None, normal Jaw: None, normal Tongue: None, normal,Extremity Movements Upper (arms, wrists, hands, fingers): None, normal Lower (legs, knees, ankles, toes): None, normal, Trunk Movements Neck, shoulders, hips: None,  normal, Overall Severity Severity of abnormal movements (highest score from questions above): None, normal Incapacitation due to abnormal movements: None, normal Patient's awareness of abnormal movements (rate only patient's report): No Awareness, Dental Status Current problems with teeth and/or dentures?: No Does patient usually wear dentures?: No  CIWA:    COWS:     Musculoskeletal: Strength & Muscle Tone: within normal limits Gait & Station: normal Patient leans: N/A  Psychiatric Specialty Exam: Physical Exam  ROS  Blood pressure 126/80, pulse 70, temperature 98 F (36.7 C), temperature source Oral, resp. rate 18, height 5\' 7"  (1.702 m), weight 68 kg (150 lb), SpO2 100 %.Body mass  index is 23.49 kg/m.  General Appearance: Well Groomed  Eye Contact:  Good  Speech:  Clear and Coherent  Volume:  Normal  Mood:  Anxious  Affect:  Appropriate and Congruent  Thought Process:  Linear and Descriptions of Associations: Intact  Orientation:  Full (Time, Place, and Person)  Thought Content:  Hallucinations: None  Suicidal Thoughts:  No  Homicidal Thoughts:  No  Memory:  Immediate;   Good Recent;   Good Remote;   Good  Judgement:  Fair  Insight:  Fair  Psychomotor Activity:  Normal  Concentration:  Concentration: Fair and Attention Span: Fair  Recall:  Good  Fund of Knowledge:  Good  Language:  Good  Akathisia:  No  Handed:    AIMS (if indicated):     Assets:  Communication Skills Social Support  ADL's:  Intact  Cognition:  WNL  Sleep:  Number of Hours: 6.75     Have you used any form of tobacco in the last 30 days? (Cigarettes, Smokeless Tobacco, Cigars, and/or Pipes): No  Has this patient used any form of tobacco in the last 30 days? (Cigarettes, Smokeless Tobacco, Cigars, and/or Pipes) Yes, No  Blood Alcohol level:  Lab Results  Component Value Date   ETH <5 12/30/2016   ETH <5 37/85/8850    Metabolic Disorder Labs:  Lab Results  Component Value Date   HGBA1C 5.1 11/14/2015   Lab Results  Component Value Date   PROLACTIN 6.8 11/14/2015   Lab Results  Component Value Date   CHOL 245 (H) 12/25/2015   TRIG 179 (H) 12/25/2015   HDL 43 12/25/2015   CHOLHDL 6.2 11/14/2015   VLDL 49 (H) 11/14/2015   LDLCALC 166 (H) 12/25/2015   LDLCALC 196 (H) 11/14/2015   Results for KENNIS, WISSMANN (MRN [id number]) as of 01/07/2017 16:56  Ref. Range 12/30/2016 19:23 01/01/2017 13:27 01/01/2017 18:05 01/01/2017 18:05 01/05/2017 67:67  BASIC METABOLIC PANEL Unknown  Rpt (A)     COMPREHENSIVE METABOLIC PANEL Unknown Rpt (A)      Sodium Latest Ref Range: 135 - 145 mmol/L 138 138     Potassium Latest Ref Range: 3.5 - 5.1 mmol/L 2.8 (L) 3.0 (L)   4.2   Chloride Latest Ref Range: 101 - 111 mmol/L 97 (L) 96 (L)     CO2 Latest Ref Range: 22 - 32 mmol/L 31 30     Glucose Latest Ref Range: 65 - 99 mg/dL 90 135 (H)     BUN Latest Ref Range: 6 - 20 mg/dL 17 17     Creatinine Latest Ref Range: 0.61 - 1.24 mg/dL 1.18 1.01     Calcium Latest Ref Range: 8.9 - 10.3 mg/dL 9.6 10.1     Anion gap Latest Ref Range: 5 - 15  10 12      Magnesium Latest Ref Range:  1.7 - 2.4 mg/dL  1.9     Alkaline Phosphatase Latest Ref Range: 38 - 126 U/L 57      Albumin Latest Ref Range: 3.5 - 5.0 g/dL 4.8      AST Latest Ref Range: 15 - 41 U/L 36      ALT Latest Ref Range: 17 - 63 U/L 57      Total Protein Latest Ref Range: 6.5 - 8.1 g/dL 7.9      Total Bilirubin Latest Ref Range: 0.3 - 1.2 mg/dL 1.2      GFR, Est African American Latest Ref Range: >60 mL/min >60 >60     GFR, Est Non African American Latest Ref Range: >60 mL/min >60 >60     Vitamin B12 Latest Ref Range: 180 - 914 pg/mL  790     WBC Latest Ref Range: 3.8 - 10.6 K/uL 8.3      RBC Latest Ref Range: 4.40 - 5.90 MIL/uL 5.33      Hemoglobin Latest Ref Range: 13.0 - 18.0 g/dL 16.5      HCT Latest Ref Range: 40.0 - 52.0 % 46.9      MCV Latest Ref Range: 80.0 - 100.0 fL 87.9      MCH Latest Ref Range: 26.0 - 34.0 pg 30.9      MCHC Latest Ref Range: 32.0 - 36.0 g/dL 35.1      RDW Latest Ref Range: 11.5 - 14.5 % 13.7      Platelets Latest Ref Range: 150 - 440 K/uL 303      Acetaminophen (Tylenol), S Latest Ref Range: 10 - 30 ug/mL <32 (L)      Salicylate Lvl Latest Ref Range: 2.8 - 30.0 mg/dL <7.0      TSH Latest Ref Range: 0.350 - 4.500 uIU/mL  0.432     Alcohol, Ethyl (B) Latest Ref Range: <5 mg/dL <5      Amphetamines, Ur Screen Latest Ref Range: NONE DETECTED  NONE DETECTED      Barbiturates, Ur Screen Latest Ref Range: NONE DETECTED  NONE DETECTED      Benzodiazepine, Ur Scrn Latest Ref Range: NONE DETECTED  NONE DETECTED      Cocaine Metabolite,Ur [place] Latest Ref Range: NONE DETECTED  NONE DETECTED       Methadone Scn, Ur Latest Ref Range: NONE DETECTED  NONE DETECTED      MDMA (Ecstasy)Ur Screen Latest Ref Range: NONE DETECTED  NONE DETECTED      Cannabinoid 50 Ng, Ur [place] Latest Ref Range: NONE DETECTED  NONE DETECTED      Opiate, Ur Screen Latest Ref Range: NONE DETECTED  NONE DETECTED      Phencyclidine (PCP) Ur S Latest Ref Range: NONE DETECTED  NONE DETECTED      Tricyclic, Ur Screen Latest Ref Range: NONE DETECTED  NONE DETECTED      EKG 12-LEAD Unknown   Rpt Rpt    See Psychiatric Specialty Exam and Suicide Risk Assessment completed by Attending Physician prior to discharge.  Discharge destination:  Home  Is patient on multiple antipsychotic therapies at discharge:  No   Has Patient had three or more failed trials of antipsychotic monotherapy by history:  No  Recommended Plan for Multiple Antipsychotic Therapies: NA   Allergies as of 01/07/2017      Reactions   Amoxicillin Hives   Penicillins    Sulfa Antibiotics       Medication List    STOP taking these medications  acetaminophen 500 MG tablet Commonly known as:  TYLENOL   Eluxadoline 75 MG Tabs Commonly known as:  VIBERZI   fexofenadine 180 MG tablet Commonly known as:  ALLEGRA Replaced by:  loratadine 10 MG tablet   hydrochlorothiazide 25 MG tablet Commonly known as:  HYDRODIURIL   multivitamin tablet   potassium chloride SA 20 MEQ tablet Commonly known as:  KLOR-CON M20   rosuvastatin 10 MG tablet Commonly known as:  CRESTOR   vitamin C 500 MG tablet Commonly known as:  ASCORBIC ACID     TAKE these medications     Indication  aspirin 81 MG tablet Take 81 mg by mouth daily.  Indication:  cardiovascular health   atenolol 25 MG tablet Commonly known as:  TENORMIN Take 1 tablet (25 mg total) by mouth daily.  Indication:  High Blood Pressure Disorder   levothyroxine 25 MCG tablet Commonly known as:  SYNTHROID, LEVOTHROID Take 1 tablet (25 mcg total) by mouth daily.  Indication:  Underactive  Thyroid   loratadine 10 MG tablet Commonly known as:  CLARITIN Take 1 tablet (10 mg total) by mouth daily. Replaces:  fexofenadine 180 MG tablet  Indication:  Hayfever   nortriptyline 25 MG capsule Commonly known as:  PAMELOR Take 4 capsules (100 mg total) by mouth at bedtime.  Indication:  MDD   VIAGRA 50 MG tablet Generic drug:  sildenafil Take 50 mg by mouth as needed for erectile dysfunction.  Indication:  Erectile Dysfunction      Follow-up Information    [name], MD. Schedule an appointment as soon as possible for a visit.   Specialty:  Gastroenterology Why:  gantroenterologist  Contact information: [place]  [place] [address] [phone]        Jerrol Banana., MD. Schedule an appointment as soon as possible for a visit.   Specialty:  Family Medicine Why:  f/u with Primary care Contact information: [address] [name] [address] [place] on 01/09/2017.   Why:  Please attend your follow up appointment at [facility] on Thursday, 01/09/17, at 12:30pm.  Please bring a copy of your hospital discharge paperwork. Contact information: [place] [address] [phone]          >30 minutes. >50 % of the time was pent in coordination of care  Signed: Hildred Priest, MD 01/07/2017, 11:32 AM

## 2017-01-09 DIAGNOSIS — F332 Major depressive disorder, recurrent severe without psychotic features: Secondary | ICD-10-CM | POA: Diagnosis not present

## 2017-01-10 DIAGNOSIS — F332 Major depressive disorder, recurrent severe without psychotic features: Secondary | ICD-10-CM | POA: Diagnosis not present

## 2017-01-10 NOTE — Telephone Encounter (Signed)
Spoke with patient and advised as below. He was at the hospital and his b/p medication was changed also. Advised patient it has been over a year for routine check up and we need to follow up on all of this. He had lab work done at the hospital but not Lipid level. Patient states he needs to look at schedule with work and treatments he is doing for magnesium in [place] and then will make appointment. Patient advised we need to see him before he runs out of new b/p medication so we can follow up first. -aa awaiting patients call abck

## 2017-01-10 NOTE — Telephone Encounter (Signed)
Lmtcb, needs appointment-aa

## 2017-01-13 DIAGNOSIS — F332 Major depressive disorder, recurrent severe without psychotic features: Secondary | ICD-10-CM | POA: Diagnosis not present

## 2017-01-14 DIAGNOSIS — F332 Major depressive disorder, recurrent severe without psychotic features: Secondary | ICD-10-CM | POA: Diagnosis not present

## 2017-01-15 DIAGNOSIS — F332 Major depressive disorder, recurrent severe without psychotic features: Secondary | ICD-10-CM | POA: Diagnosis not present

## 2017-01-15 NOTE — Telephone Encounter (Signed)
Patient is scheduled for tomorrow to see [place].  Thanks,  -Sufian Ravi

## 2017-01-16 ENCOUNTER — Encounter: Payer: Self-pay | Admitting: Family Medicine

## 2017-01-16 ENCOUNTER — Ambulatory Visit (INDEPENDENT_AMBULATORY_CARE_PROVIDER_SITE_OTHER): Payer: BLUE CROSS/BLUE SHIELD | Admitting: Family Medicine

## 2017-01-16 VITALS — BP 112/80 | HR 53 | Temp 97.6°F | Resp 16 | Wt 154.0 lb

## 2017-01-16 DIAGNOSIS — F332 Major depressive disorder, recurrent severe without psychotic features: Secondary | ICD-10-CM | POA: Diagnosis not present

## 2017-01-16 DIAGNOSIS — I10 Essential (primary) hypertension: Secondary | ICD-10-CM

## 2017-01-16 DIAGNOSIS — E782 Mixed hyperlipidemia: Secondary | ICD-10-CM | POA: Diagnosis not present

## 2017-01-16 MED ORDER — ROSUVASTATIN CALCIUM 10 MG PO TABS: 10.0000 mg | ORAL_TABLET | Freq: Every day | ORAL | 3 refills | Status: DC

## 2017-01-16 MED ORDER — AMLODIPINE BESYLATE 5 MG PO TABS: 5.0000 mg | ORAL_TABLET | Freq: Every day | ORAL | 0 refills | Status: DC

## 2017-01-16 NOTE — Patient Instructions (Signed)
We will call you with the lab results. Let us know if you are not tolerating medication.

## 2017-01-16 NOTE — Progress Notes (Signed)
Subjective:     Patient ID: William Coleman., male   DOB: January 16, 1961, 56 y.o.   MRN: [id number]  HPI  Chief Complaint  Patient presents with  . Hypertension    Patient returns to office toda Patient patient states tha bp medication was changed by another MD do to his abnormal labs. Patient was started on Atenolol 25mg  qd and reports good compliance and tolerance on medication.   . Hyperlipidemia    Follow up from 12/04/15, patient reports good compliance and tolerance on rosuvastatin 10 mg.  Recent [facility] admission for depression and suicidal ideation. He has since followed up with his psychiatrist at [facility]. HCTZ d/c'ed during his hospitalization due to hypokalemia. He will be returning to work tomorrow.    Review of Systems     Objective:   Physical Exam  Constitutional: He appears well-developed and well-nourished. No distress.  Cardiovascular: Regular rhythm.   bradycardic  Pulmonary/Chest: Breath sounds normal.  Musculoskeletal: He exhibits no edema (of lower extremities).  Psychiatric: He has a normal mood and affect. His behavior is normal.       Assessment:    1. Essential hypertension: d/c Tenormin due to potential for depression and poor outcome data. - amLODipine (NORVASC) 5 MG tablet; Take 1 tablet (5 mg total) by mouth daily.  Dispense: 90 tablet; Refill: 0  2. Mixed hyperlipidemia - Lipid panel - rosuvastatin (CRESTOR) 10 MG tablet; Take 1 tablet (10 mg total) by mouth daily.  Dispense: 90 tablet; Refill: 3  3. Severe recurrent major depression without psychotic features [facility]): stable per psychiatry    Plan:    Further f/u pending lab results.

## 2017-01-17 ENCOUNTER — Telehealth: Payer: Self-pay

## 2017-01-17 DIAGNOSIS — F332 Major depressive disorder, recurrent severe without psychotic features: Secondary | ICD-10-CM | POA: Diagnosis not present

## 2017-01-17 LAB — LIPID PANEL
CHOLESTEROL TOTAL: 185 mg/dL (ref 100–199)
Chol/HDL Ratio: 4.9 ratio (ref 0.0–5.0)
HDL: 38 mg/dL — AB (ref >39)
LDL CALC: 117 mg/dL — AB (ref 0–99)
TRIGLYCERIDES: 149 mg/dL (ref 0–149)
VLDL CHOLESTEROL CAL: 30 mg/dL (ref 5–40)

## 2017-01-17 NOTE — Telephone Encounter (Signed)
Advised patient of results. Patient wanted to know if the Lisinopril you just prescribed was the medication that caused him to have a cough in the past? Please advise. Thanks!

## 2017-01-17 NOTE — Telephone Encounter (Signed)
[id number] 

## 2017-01-17 NOTE — Telephone Encounter (Signed)
Patient advised, KW 

## 2017-01-17 NOTE — Telephone Encounter (Signed)
-----   Message from Carmon Ginsberg, [place] sent at 01/17/2017  7:32 AM EDT ----- Mildly elevated LDL cholesterol. If labs were non-fasting probably ok. If they sere done fasting would go up on his cholesterol medication.

## 2017-01-17 NOTE — Telephone Encounter (Signed)
He was prescribed amlodipine, unrelated to lisinopril

## 2017-01-20 ENCOUNTER — Telehealth: Payer: Self-pay | Admitting: Family Medicine

## 2017-01-20 DIAGNOSIS — F332 Major depressive disorder, recurrent severe without psychotic features: Secondary | ICD-10-CM | POA: Diagnosis not present

## 2017-01-20 NOTE — Telephone Encounter (Signed)
Error/MW °

## 2017-01-21 DIAGNOSIS — F332 Major depressive disorder, recurrent severe without psychotic features: Secondary | ICD-10-CM | POA: Diagnosis not present

## 2017-01-22 DIAGNOSIS — F332 Major depressive disorder, recurrent severe without psychotic features: Secondary | ICD-10-CM | POA: Diagnosis not present

## 2017-01-23 DIAGNOSIS — F332 Major depressive disorder, recurrent severe without psychotic features: Secondary | ICD-10-CM | POA: Diagnosis not present

## 2017-01-27 DIAGNOSIS — F332 Major depressive disorder, recurrent severe without psychotic features: Secondary | ICD-10-CM | POA: Diagnosis not present

## 2017-01-28 DIAGNOSIS — F332 Major depressive disorder, recurrent severe without psychotic features: Secondary | ICD-10-CM | POA: Diagnosis not present

## 2017-01-29 DIAGNOSIS — F332 Major depressive disorder, recurrent severe without psychotic features: Secondary | ICD-10-CM | POA: Diagnosis not present

## 2017-01-30 DIAGNOSIS — F332 Major depressive disorder, recurrent severe without psychotic features: Secondary | ICD-10-CM | POA: Diagnosis not present

## 2017-01-31 DIAGNOSIS — F332 Major depressive disorder, recurrent severe without psychotic features: Secondary | ICD-10-CM | POA: Diagnosis not present

## 2017-02-03 DIAGNOSIS — F332 Major depressive disorder, recurrent severe without psychotic features: Secondary | ICD-10-CM | POA: Diagnosis not present

## 2017-02-04 DIAGNOSIS — F332 Major depressive disorder, recurrent severe without psychotic features: Secondary | ICD-10-CM | POA: Diagnosis not present

## 2017-02-04 DIAGNOSIS — F331 Major depressive disorder, recurrent, moderate: Secondary | ICD-10-CM | POA: Diagnosis not present

## 2017-02-05 ENCOUNTER — Other Ambulatory Visit: Payer: Self-pay | Admitting: Family Medicine

## 2017-02-05 DIAGNOSIS — F332 Major depressive disorder, recurrent severe without psychotic features: Secondary | ICD-10-CM | POA: Diagnosis not present

## 2017-02-05 DIAGNOSIS — I10 Essential (primary) hypertension: Secondary | ICD-10-CM

## 2017-02-06 DIAGNOSIS — F331 Major depressive disorder, recurrent, moderate: Secondary | ICD-10-CM | POA: Diagnosis not present

## 2017-02-06 DIAGNOSIS — F332 Major depressive disorder, recurrent severe without psychotic features: Secondary | ICD-10-CM | POA: Diagnosis not present

## 2017-02-07 DIAGNOSIS — F332 Major depressive disorder, recurrent severe without psychotic features: Secondary | ICD-10-CM | POA: Diagnosis not present

## 2017-02-11 DIAGNOSIS — F332 Major depressive disorder, recurrent severe without psychotic features: Secondary | ICD-10-CM | POA: Diagnosis not present

## 2017-02-13 DIAGNOSIS — F332 Major depressive disorder, recurrent severe without psychotic features: Secondary | ICD-10-CM | POA: Diagnosis not present

## 2017-02-14 DIAGNOSIS — F332 Major depressive disorder, recurrent severe without psychotic features: Secondary | ICD-10-CM | POA: Diagnosis not present

## 2017-02-18 DIAGNOSIS — F332 Major depressive disorder, recurrent severe without psychotic features: Secondary | ICD-10-CM | POA: Diagnosis not present

## 2017-02-19 DIAGNOSIS — F332 Major depressive disorder, recurrent severe without psychotic features: Secondary | ICD-10-CM | POA: Diagnosis not present

## 2017-02-20 DIAGNOSIS — F332 Major depressive disorder, recurrent severe without psychotic features: Secondary | ICD-10-CM | POA: Diagnosis not present

## 2017-02-21 DIAGNOSIS — F332 Major depressive disorder, recurrent severe without psychotic features: Secondary | ICD-10-CM | POA: Diagnosis not present

## 2017-02-24 ENCOUNTER — Telehealth: Payer: Self-pay | Admitting: Pulmonary Disease

## 2017-02-24 DIAGNOSIS — F332 Major depressive disorder, recurrent severe without psychotic features: Secondary | ICD-10-CM | POA: Diagnosis not present

## 2017-02-24 NOTE — Telephone Encounter (Signed)
appt scheduled.  Nothing further needed.  

## 2017-02-24 NOTE — Telephone Encounter (Signed)
LMOM for pt informing him that we need to schedule him an OV with [facility] before we can proceed with scheduling a bronch.

## 2017-02-24 NOTE — Telephone Encounter (Signed)
Patient calling to talk about plan to do a procedure where he has a tube inserted in his nasal passage to check for reason behind chronic cough   Patient is ready to schedule this please call

## 2017-02-25 DIAGNOSIS — F332 Major depressive disorder, recurrent severe without psychotic features: Secondary | ICD-10-CM | POA: Diagnosis not present

## 2017-02-25 NOTE — Telephone Encounter (Signed)
Patient has questions about insurance and the upcoming procedure   Patient was also told he could go to an ent to do the same test   He has questions about this referral

## 2017-02-25 NOTE — Telephone Encounter (Signed)
Pt has scheduled appointment on 10/10 with Dr. Alva Garnet. He has not follow up with ENT yet but plans to schedule soon. Nothing further needed.

## 2017-02-27 DIAGNOSIS — F332 Major depressive disorder, recurrent severe without psychotic features: Secondary | ICD-10-CM | POA: Diagnosis not present

## 2017-03-04 DIAGNOSIS — F332 Major depressive disorder, recurrent severe without psychotic features: Secondary | ICD-10-CM | POA: Diagnosis not present

## 2017-03-06 DIAGNOSIS — F332 Major depressive disorder, recurrent severe without psychotic features: Secondary | ICD-10-CM | POA: Diagnosis not present

## 2017-03-07 DIAGNOSIS — F332 Major depressive disorder, recurrent severe without psychotic features: Secondary | ICD-10-CM | POA: Diagnosis not present

## 2017-03-10 ENCOUNTER — Ambulatory Visit (INDEPENDENT_AMBULATORY_CARE_PROVIDER_SITE_OTHER): Payer: BLUE CROSS/BLUE SHIELD | Admitting: Family Medicine

## 2017-03-10 VITALS — BP 140/82 | HR 84 | Temp 97.5°F | Resp 16 | Wt 156.0 lb

## 2017-03-10 DIAGNOSIS — E039 Hypothyroidism, unspecified: Secondary | ICD-10-CM

## 2017-03-10 DIAGNOSIS — F429 Obsessive-compulsive disorder, unspecified: Secondary | ICD-10-CM

## 2017-03-10 DIAGNOSIS — E782 Mixed hyperlipidemia: Secondary | ICD-10-CM | POA: Diagnosis not present

## 2017-03-10 DIAGNOSIS — I10 Essential (primary) hypertension: Secondary | ICD-10-CM | POA: Diagnosis not present

## 2017-03-10 DIAGNOSIS — Z23 Encounter for immunization: Secondary | ICD-10-CM | POA: Diagnosis not present

## 2017-03-10 DIAGNOSIS — F3175 Bipolar disorder, in partial remission, most recent episode depressed: Secondary | ICD-10-CM

## 2017-03-10 MED ORDER — AMLODIPINE BESYLATE 5 MG PO TABS: 5.0000 mg | ORAL_TABLET | Freq: Every day | ORAL | 3 refills | Status: DC

## 2017-03-10 MED ORDER — ROSUVASTATIN CALCIUM 10 MG PO TABS: 10.0000 mg | ORAL_TABLET | Freq: Every day | ORAL | 3 refills | Status: DC

## 2017-03-10 NOTE — Progress Notes (Signed)
William Coleman.  MRN: [id number] DOB: [birth year]  Subjective:  HPI   The patient is a 56 year old male who presents for follow up of hypertension. He was last seen by The office PA on 01/16/17.  Hypertension- On his last visit to the office the patient was instructed to discontinue his Tenormin and start Amlodipine due to the possible side effect of depression with use of Tenormin.    The patient needs to have his Crestor and Amlodipine refilled Patient continues to have significant OCD issues since the death of his parents.  Patient Active Problem List   Diagnosis Date Noted  . OCD (obsessive compulsive disorder) 01/01/2017  . Severe recurrent major depression without psychotic features ([place]) 11/10/2015  . HLD (hyperlipidemia) 11/10/2014  . HTN (hypertension) 11/10/2014  . Adult hypothyroidism 11/10/2014  . 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/10/2014    Past Medical History:  Diagnosis Date  . Anginal pain ([place])   . Bipolar disorder ([place])   . Chronic diarrhea   . Depression   . Hypertension   . Hypothyroidism   . Obsessive compulsive disorder   . Sleep apnea    patient states it was "marginal", no CPAP    Social History   Social History  . Marital status: Single    Spouse name: N/A  . Number of children: N/A  . Years of education: N/A   Occupational History  . Not on file.   Social History Main Topics  . Smoking status: Never Smoker  . Smokeless tobacco: Never Used  . Alcohol use Yes     Comment: rarely  . Drug use: No  . Sexual activity: No   Other Topics Concern  . Not on file   Social History Narrative  . No narrative on file    Outpatient Encounter Prescriptions as of 03/10/2017  Medication Sig  . amLODipine (NORVASC) 5 MG tablet Take 1 tablet (5 mg total) by mouth daily.  Marland Kitchen aspirin 81 MG tablet Take 81 mg by mouth daily.  Marland Kitchen levothyroxine (SYNTHROID, LEVOTHROID) 25 MCG tablet Take 1 tablet (25 mcg total) by mouth daily.  . rosuvastatin (CRESTOR) 10 MG  tablet Take 1 tablet (10 mg total) by mouth daily.  Marland Kitchen VIAGRA 50 MG tablet Take 50 mg by mouth as needed for erectile dysfunction.   Marland Kitchen VIBERZI 100 MG TABS   . [DISCONTINUED] loratadine (CLARITIN) 10 MG tablet Take 1 tablet (10 mg total) by mouth daily.  . [DISCONTINUED] nortriptyline (PAMELOR) 25 MG capsule Take 4 capsules (100 mg total) by mouth at bedtime.   No facility-administered encounter medications on file as of 03/10/2017.     Allergies  Allergen Reactions  . Amoxicillin Hives  . Penicillins   . Sulfa Antibiotics     Review of Systems  Constitutional: Negative for fever and malaise/fatigue.  HENT: Negative.   Respiratory: Negative for cough and shortness of breath.   Cardiovascular: Negative for chest pain, palpitations and leg swelling (ankle swelling).  Genitourinary: Negative.   Skin: Negative.   Neurological: Negative.  Negative for weakness.  Endo/Heme/Allergies: Negative.     Objective:  BP 140/82 (BP Location: Right Arm, Patient Position: Sitting, Cuff Size: Normal)   Pulse 84   Temp (!) 97.5 F (36.4 C) (Oral)   Resp 16   Wt 156 lb (70.8 kg)   BMI 24.43 kg/m   Physical Exam  Constitutional: He is oriented to person, place, and time and well-developed, well-nourished, and in no distress.  HENT:  Head:  Normocephalic and atraumatic.  Eyes: Conjunctivae are normal. No scleral icterus.  Neck: No thyromegaly present.  Cardiovascular: Normal rate, regular rhythm and normal heart sounds.   Pulmonary/Chest: Effort normal and breath sounds normal.  Abdominal: Soft.  Neurological: He is alert and oriented to person, place, and time. Gait normal. GCS score is 15.  Skin: Skin is warm and dry.  Psychiatric: Mood, memory and affect normal.    Assessment and Plan :  HTN MDD OCD Hypothyroid  I have done the exam and reviewed the chart and it is accurate to the best of my knowledge. Development worker, community has been used and  any errors in dictation or transcription are  unintentional. Miguel Aschoff M.D. [facility]

## 2017-03-11 DIAGNOSIS — R05 Cough: Secondary | ICD-10-CM | POA: Diagnosis not present

## 2017-03-11 DIAGNOSIS — J309 Allergic rhinitis, unspecified: Secondary | ICD-10-CM | POA: Diagnosis not present

## 2017-03-12 DIAGNOSIS — F332 Major depressive disorder, recurrent severe without psychotic features: Secondary | ICD-10-CM | POA: Diagnosis not present

## 2017-03-13 ENCOUNTER — Telehealth: Payer: Self-pay

## 2017-03-13 DIAGNOSIS — F332 Major depressive disorder, recurrent severe without psychotic features: Secondary | ICD-10-CM | POA: Diagnosis not present

## 2017-03-13 NOTE — Telephone Encounter (Signed)
Spoke with pt regarding his Viberzi medication. Pt advised me he had been taking 800mg  total of this medication. I advised him the was only suppose to be taking 200mg  total daily. I advised him his was well above max daily dose of this medicine. He stated he was not having any constipation issues. He was having issues with anal sphincter spasms. He has also been adding imodium while taking this medication. Pt has asked about side effects from taking too much of this medication. I have advised him that he would have experienced effects right away such as dizziness, constipation, bowel obstruction, nausea. Liver issues will need to be discussed at his office appt on 03/24/17 and labs ordered.

## 2017-03-19 ENCOUNTER — Ambulatory Visit (INDEPENDENT_AMBULATORY_CARE_PROVIDER_SITE_OTHER): Payer: BLUE CROSS/BLUE SHIELD | Admitting: Pulmonary Disease

## 2017-03-19 ENCOUNTER — Encounter: Payer: Self-pay | Admitting: Pulmonary Disease

## 2017-03-19 ENCOUNTER — Telehealth: Payer: Self-pay | Admitting: *Deleted

## 2017-03-19 VITALS — BP 154/100 | HR 81 | Resp 16 | Ht 67.0 in | Wt 152.0 lb

## 2017-03-19 DIAGNOSIS — R053 Chronic cough: Secondary | ICD-10-CM

## 2017-03-19 DIAGNOSIS — R05 Cough: Secondary | ICD-10-CM

## 2017-03-19 DIAGNOSIS — J310 Chronic rhinitis: Secondary | ICD-10-CM

## 2017-03-19 MED ORDER — FEXOFENADINE HCL 180 MG PO TABS: 180.0000 mg | ORAL_TABLET | Freq: Every day | ORAL | 5 refills | Status: DC

## 2017-03-19 MED ORDER — AZELASTINE-FLUTICASONE 137-50 MCG/ACT NA SUSP: 2.0000 | Freq: Every day | NASAL | 10 refills | Status: DC

## 2017-03-19 NOTE — Patient Instructions (Addendum)
Nasal rinses as recommended by Dr. Tami Ribas Dymista nasal spray - prescription has been entered Allegra 180 mg daily - prescription has been entered Delsym as needed for cough suppression  Follow-up in 6 weeks with chest x-ray and lung function tests (PFTs) prior

## 2017-03-19 NOTE — Telephone Encounter (Signed)
Dymista PA initiated via covermymeds today. Key: [id number]  Pending decision.

## 2017-03-21 NOTE — Telephone Encounter (Signed)
Dymista has been approved 03/19/18-06/09/38

## 2017-03-24 ENCOUNTER — Encounter: Payer: Self-pay | Admitting: Gastroenterology

## 2017-03-24 ENCOUNTER — Ambulatory Visit (INDEPENDENT_AMBULATORY_CARE_PROVIDER_SITE_OTHER): Payer: BLUE CROSS/BLUE SHIELD | Admitting: Gastroenterology

## 2017-03-24 ENCOUNTER — Telehealth: Payer: Self-pay | Admitting: Gastroenterology

## 2017-03-24 VITALS — BP 140/95 | HR 75 | Temp 98.8°F | Ht 67.0 in | Wt 154.5 lb

## 2017-03-24 DIAGNOSIS — K580 Irritable bowel syndrome with diarrhea: Secondary | ICD-10-CM | POA: Diagnosis not present

## 2017-03-24 MED ORDER — DIPHENOXYLATE-ATROPINE 2.5-0.025 MG PO TABS: 1.0000 | ORAL_TABLET | Freq: Four times a day (QID) | ORAL | 5 refills | Status: DC | PRN

## 2017-03-24 NOTE — Telephone Encounter (Signed)
Patient called and is still waiting for his RX to be called in.

## 2017-03-24 NOTE — Progress Notes (Signed)
Primary Care Physician: Jerrol Banana., MD  Primary Gastroenterologist:  Dr. [name]  No chief complaint on file.   HPI: William Coleman. is a 56 y.o. male here for follow-up of loose stools. The patient was then told to take Imodium as needed and to add a probiotic, VSL#3. The patient had reported that his bowel movements were peanut butter like. The patient had a colonoscopy without any source of his symptoms being found. The patient reports that he has one soft bowel movement a day.  He is taking between 10 and 12 mg of Imodium a day.  He also reports that he takes his Vibrozi but still has loose bowel movements.  He reports that he thinks it has something to do with his anal sphincter because after going to the bathroom he states that he will then go to the shower and then have leakage of stool.  He denies any further diarrhea throughout the entire day.  He also takes fiber daily.  The patient has had a colonoscopy in the past without any source of his diarrhea found.  The patient was unhappy with seeing Dr. Vicente Males in the past because Dr. Vicente Males had told him to try and see if avoiding sugar may help with his diarrhea.                  Current Outpatient Prescriptions  Medication Sig Dispense Refill  . amLODipine (NORVASC) 5 MG tablet Take 1 tablet (5 mg total) by mouth daily. 90 tablet 3  . aspirin 81 MG tablet Take 81 mg by mouth daily.    . Azelastine-Fluticasone (DYMISTA) 137-50 MCG/ACT SUSP Place 2 sprays into the nose daily. 1 Bottle 10  . fexofenadine (ALLEGRA ALLERGY) 180 MG tablet Take 1 tablet (180 mg total) by mouth daily. 30 tablet 5  . levothyroxine (SYNTHROID, LEVOTHROID) 25 MCG tablet Take 1 tablet (25 mcg total) by mouth daily. 90 tablet 0  . rosuvastatin (CRESTOR) 10 MG tablet Take 1 tablet (10 mg total) by mouth daily. 90 tablet 3  . VIAGRA 50 MG tablet Take 50 mg by mouth as needed for erectile dysfunction.   1  . VIBERZI 100 MG TABS Take 100 mg by  mouth 2 (two) times daily.   4   No current facility-administered medications for this visit.     Allergies as of 03/24/2017 - Review Complete 03/19/2017  Allergen Reaction Noted  . Amoxicillin Hives 11/10/2014  . Penicillins  11/10/2014  . Sulfa antibiotics  11/10/2014    ROS:  General: Negative for anorexia, weight loss, fever, chills, fatigue, weakness. ENT: Negative for hoarseness, difficulty swallowing , nasal congestion. CV: Negative for chest pain, angina, palpitations, dyspnea on exertion, peripheral edema.  Respiratory: Negative for dyspnea at rest, dyspnea on exertion, cough, sputum, wheezing.  GI: See history of present illness. GU:  Negative for dysuria, hematuria, urinary incontinence, urinary frequency, nocturnal urination.  Endo: Negative for unusual weight change.    Physical Examination:   There were no vitals taken for this visit.  General: Well-nourished, well-developed in no acute distress.  Eyes: No icterus. Conjunctivae pink. Mouth: Oropharyngeal mucosa moist and pink , no lesions erythema or exudate. Lungs: Clear to auscultation bilaterally. Non-labored. Heart: Regular rate and rhythm, no murmurs rubs or gallops.  Abdomen: Bowel sounds are normal, nontender, nondistended, no hepatosplenomegaly or masses, no abdominal bruits or hernia , no rebound or guarding.   Extremities: No lower extremity edema. No clubbing or deformities.  Neuro: Alert and oriented x 3.  Grossly intact. Skin: Warm and dry, no jaundice.   Psych: Alert and cooperative, normal mood and affect.  Labs:    Imaging Studies: No results found.  Assessment and Plan:   William Coleman. is a 56 y.o. y/o male With diarrhea predominant irritable bowel syndrome who states that he has one soft bowel movement a day but states he cannot go on living like this because he feels that his anal sphincter spasms and relaxes after he has a bowel movement thereby about letting him leave the  house due to the fear of having more bowel movements.  The patient is on fiber and Imodium in addition to taking Vibrozi.  He tried to double up in triple up on his Vibrozi in the past without any solidification of his stools.  The patient will be started on Lomotil every morning before he has his loose bowel movements to see if hardening up his stools may help him.  The patient was explained the plan multiple times but does not appear to comprehend the plan.  We have written down for him that he should not stop any of his other medications but showed cutdown on the Imodium while he is started on the Lomotil.  The patient will contact us if his symptoms do not improve.    [name], MD. [name]   Note: This dictation was prepared with Dragon dictation along with smaller phrase technology. Any transcriptional errors that result from this process are unintentional.

## 2017-03-24 NOTE — Patient Instructions (Signed)
We are adding Lomotil to your medication regimen. You may take this up to 4 times daily AS NEEDED.  If you start to experience constipation reduce Lomotil by 1 pill each day until stools are back to normal.   If you have any questions, please contact our office.

## 2017-03-25 NOTE — Telephone Encounter (Signed)
Rx for Lomotil called into [facility].

## 2017-03-28 NOTE — Progress Notes (Signed)
PULMONARY OFFICE FOLLOW UP NOTE  Requesting MD/Service: Miguel Aschoff, MD Date of initial consultation: 05/14/16 Reason for consultation: Subacute cough  PT PROFILE: 56 y.o. M never smoker referred for evaluation of cough of > 1 month's duration. Initial impression: subacute cough, recurrent sinusitis. Initial plan:  Continue Ranitidine and decongestants. Add Flonase nasal inhaler - 2 sprays per nostril daily. Change Advair to 250/50 strength. Doxycycline 100 mg BID X 7 days. ROV 4-6 weeks  DATA: CT sinuses 04/16/16: Mucosal thickening along the floor and anterior wall of the left maxillary sinus, probably secondary to dental root incursion. No layering sinus fluid. The remainder the sinuses are clear  SUBJ: Initially seen by me in December 2017. Has been seen by multiple other pulmonary providers since that time. At one point, there was a plan for bronchoscopy. However, he called to cancel that procedure.Now, returns for further evaluation of chronic cough. He underwent laryngoscopy by Dr. Tami Ribas last week. He continues to have chronic daily cough but does not believe it is as bad as it was last winter. He denies nocturnal cough. His cough is nonproductive. He denies hemoptysis, dyspnea, chest pain. He thinks his cough is due to posterior nasal drainage.  Vitals:   03/19/17 1032 03/19/17 1036  BP:  (!) 154/100  Pulse:  81  Resp: 16   SpO2:  97%  Weight: 68.9 kg (152 lb)   Height: 5\' 7"  (1.702 m)     EXAM:  Gen: NAD, no cough during encounter HEENT: NCAT, bilateral rhinitis R>L Neck: Supple without LAN, thyromegaly, JVD Lungs: BS slightly coarse without wheezes Cardiovascular: RRR, no murmurs noted Abdomen: Soft, nontender, normal BS Ext: without clubbing, cyanosis, edema Neuro: CNs grossly intact, motor and sensory intact Skin: Limited exam, no lesions noted  DATA:   BMP Latest Ref Rng & Units 01/05/2017 01/01/2017 12/30/2016  Glucose 65 - 99 mg/dL - 135(H) 90  BUN 6 - 20  mg/dL - 17 17  Creatinine 0.61 - 1.24 mg/dL - 1.01 1.18  BUN/Creat Ratio 9 - 20 - - -  Sodium 135 - 145 mmol/L - 138 138  Potassium 3.5 - 5.1 mmol/L 4.2 3.0(L) 2.8(L)  Chloride 101 - 111 mmol/L - 96(L) 97(L)  CO2 22 - 32 mmol/L - 30 31  Calcium 8.9 - 10.3 mg/dL - 10.1 9.6    CBC Latest Ref Rng & Units 12/30/2016 06/24/2016 12/25/2015  WBC 3.8 - 10.6 K/uL 8.3 5.8 6.4  Hemoglobin 13.0 - 18.0 g/dL 16.5 15.4 14.8  Hematocrit 40.0 - 52.0 % 46.9 44.7 42.9  Platelets 150 - 440 K/uL 303 366.0 306    CXR (06/24/16): [facility]    IMPRESSION:     ICD-10-CM   1. Chronic cough R05 Pulmonary Function Test [facility] Only    DG Chest 2 View  2. Chronic rhinitis J31.0     PLAN:  Nasal rinses as recommended by Dr. Tami Ribas Dymista nasal spray - prescription has been entered Allegra 180 mg daily - prescription has been entered Delsym as needed for cough suppression  Follow-up in 6 weeks with chest x-ray and lung function tests (PFTs) prior   Merton Border, MD PCCM service Mobile [phone] Pager [phone] 03/28/2017

## 2017-03-31 ENCOUNTER — Telehealth: Payer: Self-pay | Admitting: Pulmonary Disease

## 2017-03-31 NOTE — Telephone Encounter (Signed)
Answered pt's questions about the PFT he has scheduled. Nothing further needed.

## 2017-03-31 NOTE — Telephone Encounter (Signed)
Pt would like a call regarding his PFT, he has some questions.

## 2017-04-02 DIAGNOSIS — F332 Major depressive disorder, recurrent severe without psychotic features: Secondary | ICD-10-CM | POA: Diagnosis not present

## 2017-04-07 ENCOUNTER — Telehealth: Payer: Self-pay | Admitting: Gastroenterology

## 2017-04-07 NOTE — Telephone Encounter (Signed)
Pt will stop by the office to pick up work note on Tuesday.

## 2017-04-07 NOTE — Telephone Encounter (Signed)
Please call patient. He needs a dr note. Please call him today.

## 2017-04-08 ENCOUNTER — Other Ambulatory Visit: Payer: Self-pay

## 2017-04-21 DIAGNOSIS — F429 Obsessive-compulsive disorder, unspecified: Secondary | ICD-10-CM | POA: Diagnosis not present

## 2017-04-22 ENCOUNTER — Ambulatory Visit: Payer: BLUE CROSS/BLUE SHIELD | Attending: Pulmonary Disease

## 2017-04-22 DIAGNOSIS — R05 Cough: Secondary | ICD-10-CM | POA: Insufficient documentation

## 2017-04-22 DIAGNOSIS — R053 Chronic cough: Secondary | ICD-10-CM

## 2017-04-24 DIAGNOSIS — R159 Full incontinence of feces: Secondary | ICD-10-CM | POA: Diagnosis not present

## 2017-04-29 ENCOUNTER — Other Ambulatory Visit: Payer: Self-pay

## 2017-04-29 ENCOUNTER — Telehealth: Payer: Self-pay | Admitting: Gastroenterology

## 2017-04-29 DIAGNOSIS — R159 Full incontinence of feces: Secondary | ICD-10-CM

## 2017-04-29 NOTE — Telephone Encounter (Signed)
Please call patient today about paperwork. He said it needed to be submitted today.

## 2017-04-29 NOTE — Telephone Encounter (Signed)
Contacted pt and advised his paperwork is not due until 05/01/17 but I have completed and faxed today.

## 2017-04-30 ENCOUNTER — Telehealth: Payer: Self-pay | Admitting: Gastroenterology

## 2017-04-30 NOTE — Telephone Encounter (Signed)
LVM for pt to return my call.

## 2017-04-30 NOTE — Telephone Encounter (Signed)
Spoke with pt regarding is FMLA and how we wrote him out for work, Also, discussed the referral to the rehabilitation.

## 2017-04-30 NOTE — Telephone Encounter (Signed)
Patient called and stated he really needs to talk to you today. Please call

## 2017-05-05 ENCOUNTER — Other Ambulatory Visit: Payer: Self-pay | Admitting: Gastroenterology

## 2017-05-05 NOTE — Telephone Encounter (Signed)
Patient states he would [facility] a [facility]

## 2017-05-06 DIAGNOSIS — F429 Obsessive-compulsive disorder, unspecified: Secondary | ICD-10-CM | POA: Diagnosis not present

## 2017-05-07 ENCOUNTER — Telehealth: Payer: Self-pay | Admitting: Gastroenterology

## 2017-05-07 NOTE — Telephone Encounter (Signed)
Patient would like for you to call him regarding the paperwork he gave you. I told him that I gave it to you and you were working on it. Please call.

## 2017-05-07 NOTE — Telephone Encounter (Signed)
Advised pt his insurance will not cover early refills. Also, advised pt he needs to take as directed as we will not authorization early refills unless there is an emergency. Pt verbalized understanding.

## 2017-05-09 ENCOUNTER — Ambulatory Visit (INDEPENDENT_AMBULATORY_CARE_PROVIDER_SITE_OTHER): Payer: BLUE CROSS/BLUE SHIELD | Admitting: Pulmonary Disease

## 2017-05-09 ENCOUNTER — Ambulatory Visit
Admission: RE | Admit: 2017-05-09 | Discharge: 2017-05-09 | Disposition: A | Discharge status: Home / Self Care | Payer: BLUE CROSS/BLUE SHIELD | Source: Ambulatory Visit | Attending: Pulmonary Disease | Admitting: Pulmonary Disease

## 2017-05-09 ENCOUNTER — Encounter: Payer: Self-pay | Admitting: Pulmonary Disease

## 2017-05-09 VITALS — BP 138/100 | HR 82 | Ht 67.0 in | Wt 147.0 lb

## 2017-05-09 DIAGNOSIS — R05 Cough: Secondary | ICD-10-CM | POA: Insufficient documentation

## 2017-05-09 DIAGNOSIS — R053 Chronic cough: Secondary | ICD-10-CM

## 2017-05-09 DIAGNOSIS — J329 Chronic sinusitis, unspecified: Secondary | ICD-10-CM | POA: Diagnosis not present

## 2017-05-09 NOTE — Progress Notes (Signed)
PULMONARY OFFICE FOLLOW UP NOTE  Requesting MD/Service: Miguel Aschoff, MD Date of initial consultation: 05/14/16 Reason for consultation: Subacute cough  PT PROFILE: 56 y.o. M never smoker referred for evaluation of cough of > 1 month's duration. Initial impression: subacute cough, recurrent sinusitis. Initial plan:  Continue Ranitidine and decongestants. Add Flonase nasal inhaler - 2 sprays per nostril daily. Change Advair to 250/50 strength. Doxycycline 100 mg BID X 7 days. ROV 4-6 weeks  DATA: CT sinuses 04/16/16: Mucosal thickening along the floor and anterior wall of the left maxillary sinus, probably secondary to dental root incursion. No layering sinus fluid. The remainder the sinuses are clear Spirometry 06/24/16:  Borderline obstruction PFTs 04/22/17: Normal spirometry, normal lung volumes, normal DLCO  SUBJ: Little change overall.  Still with chronic daily cough, sinus and nasal congestion, posterior nasal drainage.  The thing that helps his cough the most is Allegra-D. Denies CP, fever, purulent sputum, hemoptysis, LE edema and calf tenderness   Vitals:   05/09/17 1512 05/09/17 1515  BP:  (!) 138/100  Pulse:  82  SpO2:  98%  Weight: 66.7 kg (147 lb)   Height: 5\' 7"  (1.702 m)     EXAM:  Gen: NAD HEENT: NCAT, mild rhinitis Lungs: Clear to auscultation and percussion Cardiovascular: RRR, no murmurs noted Abdomen: Soft, nontender, normal BS Ext: without clubbing, cyanosis, edema Neuro: No focal deficits  DATA:   BMP Latest Ref Rng & Units 01/05/2017 01/01/2017 12/30/2016  Glucose 65 - 99 mg/dL - 135(H) 90  BUN 6 - 20 mg/dL - 17 17  Creatinine 0.61 - 1.24 mg/dL - 1.01 1.18  BUN/Creat Ratio 9 - 20 - - -  Sodium 135 - 145 mmol/L - 138 138  Potassium 3.5 - 5.1 mmol/L 4.2 3.0(L) 2.8(L)  Chloride 101 - 111 mmol/L - 96(L) 97(L)  CO2 22 - 32 mmol/L - 30 31  Calcium 8.9 - 10.3 mg/dL - 10.1 9.6    CBC Latest Ref Rng & Units 12/30/2016 06/24/2016 12/25/2015  WBC 3.8 - 10.6  K/uL 8.3 5.8 6.4  Hemoglobin 13.0 - 18.0 g/dL 16.5 15.4 14.8  Hematocrit 40.0 - 52.0 % 46.9 44.7 42.9  Platelets 150 - 440 K/uL 303 366.0 306    CXR (05/09/17): [facility]    IMPRESSION:     ICD-10-CM   1. Chronic cough R05   2. Chronic sinusitis J32.9    There does not appear to be any pulmonary pathology to account for his chronic cough.  All of his symptoms seem to be focused mostly the nasal passages and sinuses with posterior nasal drainage.  The only medication that has provided significant relief is Allegra-D.  PLAN:  Continue nasal rinses as recommended by Dr. Tami Ribas Continue Dymista nasal spray - prescription has been entered Continue Allegra or Allegra-D as needed Continue Delsym as needed for cough suppression  Follow-up as needed   Merton Border, MD PCCM service Mobile [phone] Pager [phone] 05/13/2017

## 2017-05-09 NOTE — Patient Instructions (Signed)
Your chest x-ray and lung function tests are entirely normal I do not believe that there is a pulmonary or lung cause for your cough I suspect the cough is due to the chronic inflammation in your nasal passages and sinuses Continue previous therapies as outlined by Dr. Tami Ribas Follow-up as needed

## 2017-05-12 ENCOUNTER — Telehealth: Payer: Self-pay | Admitting: Gastroenterology

## 2017-05-12 NOTE — Telephone Encounter (Signed)
Patient would like for you to call him. °

## 2017-05-12 NOTE — Telephone Encounter (Signed)
Pt wanted to know if we had received his paperwork or any notification from [facility].

## 2017-05-20 ENCOUNTER — Telehealth: Payer: Self-pay | Admitting: Gastroenterology

## 2017-05-20 NOTE — Telephone Encounter (Signed)
Patient LVM for you to call him. He has paperwork that needs to be faxed in by Friday. Please call

## 2017-05-21 NOTE — Telephone Encounter (Signed)
Pt dropped his FMLA paperwork off to be sent off.

## 2017-05-26 ENCOUNTER — Other Ambulatory Visit: Payer: Self-pay

## 2017-05-26 ENCOUNTER — Ambulatory Visit: Payer: BLUE CROSS/BLUE SHIELD | Attending: Gastroenterology | Admitting: Physical Therapy

## 2017-05-26 DIAGNOSIS — M533 Sacrococcygeal disorders, not elsewhere classified: Secondary | ICD-10-CM | POA: Diagnosis not present

## 2017-05-26 DIAGNOSIS — R278 Other lack of coordination: Secondary | ICD-10-CM

## 2017-05-26 DIAGNOSIS — M6208 Separation of muscle (nontraumatic), other site: Secondary | ICD-10-CM | POA: Diagnosis not present

## 2017-05-26 NOTE — Patient Instructions (Addendum)
Increase water gradually from 8 fl oz to 2 ( 8 fl oz) this week , decrease sodas by 2 servings  Gradually water increase  4 ( 8 fl oz) per day, 1 soda per day,  1 tea per day   To increase water ration to acidic liquids   Take medications with room temp  water instead of soda.    ______  Do not push with abdominal muscles to have bowel movements   Breathe less into the chest and more in the ribcage to access pelvic floor relaxation     --  Sleep with pillow between knees   _____________  Open book ( handout)

## 2017-05-27 NOTE — Therapy (Signed)
[place] MAIN [facility] SERVICES [address] [place], [place], [address] Phone: [phone]   Fax:  [phone]  Physical Therapy Evaluation  Patient Details  Name: William Coleman. MRN: [id number] Date of Birth: 10-10-1960 Referring Provider: Allen Norris    Encounter Date: 05/26/2017  PT End of Session - 05/27/17 1427    Visit Number  1    Number of Visits  12    Date for PT Re-Evaluation  08/19/17    PT Start Time  1500    PT Stop Time  1605    PT Time Calculation (min)  65 min       Past Medical History:  Diagnosis Date  . Anancastic neurosis   . Anginal pain ([place])   . Anxiety, generalized   . Bipolar disorder ([place])   . Chronic diarrhea   . Depression   . Hypertension   . Hypothyroidism   . Obsessive compulsive disorder   . OCD (obsessive compulsive disorder)   . Sleep apnea    patient states it was "marginal", no CPAP    Past Surgical History:  Procedure Laterality Date  . COLONOSCOPY WITH PROPOFOL N/A 02/05/2016   Procedure: COLONOSCOPY WITH PROPOFOL;  Surgeon: [name], MD;  Location: [place];  Service: Endoscopy;  Laterality: N/A;  . MOUTH SURGERY     orthodontic surgery for underbite  . MYRINGOTOMY WITH TUBE PLACEMENT    . NASAL SEPTUM SURGERY    . POLYPECTOMY N/A 02/05/2016   Procedure: POLYPECTOMY;  Surgeon: [name], MD;  Location: [place];  Service: Endoscopy;  Laterality: N/A;    There were no vitals filed for this visit.   Subjective Assessment - 05/26/17 1510    Subjective Pt reports his fecal incontinence started in 2017.  Pt started having to bowel movements while showering or while in the bedroom even after having had a bowel movement.  Pt reports he has always had messy loose bowels 1-2 x day.  Pt noticed that he felt the sense to go again to the toilet even after having daily bowel movement Denied wearing pads. This has caused him to call out from work. This Sx affects his  ability to live a normal life on a daily basis.     Denied constipation, blood in stool. Colonoscopy showed benign polyps.  Pt performed heavy lifting at a job for 5 years in the past. Denied pain associated with bowel mvoements , urination, nor sexual intercourse.    Daily water intake: "Probably not enough". 8 oz glass, 2-3 (8 fl oz ) tea,  1-2 cups of coffee,  1-3 glasses of soda and takes medications with soda.  Pt eats alot of vegetables and some breads, and not a whole lot of fruit.  There is more stress from his Sx and sinus problems than from anything else. Pt has no stress from relationships nor work.  Pt does not eat breakfast nor lunch. Pt eats only one regular full meal a day.      Pertinent History  Anal manometry showed dysynergia .  Sprained ankle L 2017 but received no Tx. Athlete's foot on both feet and the L foot swells more than R     Patient Stated Goals  go to work on a regular basis, exercise regularly, live a normal life          [facility] PT Assessment - 05/26/17 1538      Assessment   Medical Diagnosis  fecal incontinence  Referring Provider  Wohl       Precautions   Precautions  None      Restrictions   Weight Bearing Restrictions  No      Balance Screen   Has the patient fallen in the past 6 months  No      Observation/Other Assessments   Observations  ankles under chair       Coordination   Gross Motor Movements are Fluid and Coordinated  -- abdominal strain w/ cue for bowel movements      Palpation   Spinal mobility  increased thoracic hypomobility     SI assessment   Standing: L PSIS more posterior and iliac crest higher than R .  L with limited SIJ mobility         Bed Mobility   Bed Mobility  -- crunch sit up       Ambulation/Gait   Gait Comments  decreased L stance phase, R trunk lean               Objective measurements completed on examination: See above findings.    Pelvic Floor Special Questions - 05/26/17 1556    Diastasis  Recti  3 fingers along linea alba     External Perineal Exam  no pelvic floor mm tensions (posterior) . tested through pants        [facility] Adult PT Treatment/Exercise - 05/26/17 1538      Neuro Re-ed    Neuro Re-ed Details   see pt instructions       Manual Therapy   Manual therapy comments  AP mob L hip ( FADDIR)  , rotational mob/ MWM  with hip abd                   PT Long Term Goals - 05/27/17 1413      PT LONG TERM GOAL #1   Title  Pt will decrease his COREFO score from 22% to < 17% in order improve GI function and return to community events    Time  12    Period  Weeks    Status  New    Target Date  08/19/17      PT LONG TERM GOAL #2   Title  Pt will demo decreased abdominal separation from 3 fingers width along linea alba to < 2 fingers in order to improve intraabdominal pressure for motility, postural stability,  and pelvic floor mobility    Time  6    Period  Weeks    Status  New    Target Date  07/08/17      PT LONG TERM GOAL #3   Title  Pt will demo less R trunk lean and equal stance phase B in gait in order to walk with minimized risk for pelvic floor and spinal deficits     Time  4    Period  Weeks    Status  New    Target Date  06/24/17      PT LONG TERM GOAL #4   Title  Pt will demo no pelvic obliquities across 2 visits in order to progress with deep core strengthening exercises     Time  2    Period  Weeks    Status  New    Target Date  06/10/17             Plan - 05/27/17 1428    Clinical Impression Statement  Pt is a 56 yo male who  complains of fecal incontinence and urgency which impact his ability to work and participate in community activities. Pt's clinical presentations include limited spinal mobility, pelvic obliquities, diastasis recti, dyscoordination and strength of pelvic floor mm, poor body mechanics which places strain on the abdominal/pelvic floor mm. These are deficits that indicate an ineffective intraabdominal pressure  system associated with increased risk for leakage.Pt used to work in a job that required gravity-loaded tasks at work that may have also placed load on his pelvic floor system as well. Pt also showed gait deviations and further assessment for leg length discrepancy/ hip strength will be made at next session.  Following Tx today, pt showed a more equally aligned pelvic girdle with improved R hip mobility and demo'd IND with coordination of deep core mm.     Clinical Presentation  Evolving    Clinical Decision Making  Moderate    Rehab Potential  Good    PT Frequency  1x / week    PT Duration  12 weeks    PT Treatment/Interventions  Gait training;Neuromuscular re-education;Functional mobility training;Electrical Stimulation;Therapeutic activities;Stair training;Patient/family education;Therapeutic exercise;Moist Heat;Energy conservation    Consulted and Agree with Plan of Care  Patient       Patient will benefit from skilled therapeutic intervention in order to improve the following deficits and impairments:  Hypomobility, Decreased endurance, Pain, Decreased strength, Difficulty walking, Decreased mobility, Decreased balance, Decreased range of motion, Decreased coordination, Postural dysfunction, Improper body mechanics  Visit Diagnosis: Other lack of coordination  Diastasis recti  Sacrococcygeal disorders, not elsewhere classified     Problem List Patient Active Problem List   Diagnosis Date Noted  . OCD (obsessive compulsive disorder) 01/01/2017  . Severe recurrent major depression without psychotic features ([place]) 11/10/2015  . HLD (hyperlipidemia) 11/10/2014  . HTN (hypertension) 11/10/2014  . Adult hypothyroidism 11/10/2014  . 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/10/2014    Jerl Mina  ,PT, DPT, E-RYT  05/27/2017, 2:29 PM  [place] MAIN [facility] SERVICES [address] [place], [place], [address] Phone: [phone]   Fax:  [phone]  Name:  William Coleman. MRN: [id number] Date of Birth: [birth year]

## 2017-05-28 ENCOUNTER — Ambulatory Visit: Payer: BLUE CROSS/BLUE SHIELD | Admitting: Physical Therapy

## 2017-06-16 ENCOUNTER — Ambulatory Visit: Payer: BLUE CROSS/BLUE SHIELD | Attending: Gastroenterology | Admitting: Physical Therapy

## 2017-06-16 DIAGNOSIS — M533 Sacrococcygeal disorders, not elsewhere classified: Secondary | ICD-10-CM | POA: Insufficient documentation

## 2017-06-16 DIAGNOSIS — M6208 Separation of muscle (nontraumatic), other site: Secondary | ICD-10-CM

## 2017-06-16 DIAGNOSIS — R278 Other lack of coordination: Secondary | ICD-10-CM

## 2017-06-18 NOTE — Therapy (Signed)
[place] MAIN [facility] SERVICES [address] [name], [place], [address] Phone: [phone]   Fax:  [phone]  Physical Therapy Treatment  Patient Details  Name: William Coleman. MRN: [id number] Date of Birth: 1960/12/05 Referring Provider: Allen Norris    Encounter Date: 06/16/2017  PT End of Session - 06/18/17 0100    Visit Number  2    Number of Visits  12    Date for PT Re-Evaluation  08/19/17    PT Start Time  9924    PT Stop Time  1600    PT Time Calculation (min)  45 min       Past Medical History:  Diagnosis Date  . Anancastic neurosis   . Anginal pain ([place])   . Anxiety, generalized   . Bipolar disorder ([place])   . Chronic diarrhea   . Depression   . Hypertension   . Hypothyroidism   . Obsessive compulsive disorder   . OCD (obsessive compulsive disorder)   . Sleep apnea    patient states it was "marginal", no CPAP    Past Surgical History:  Procedure Laterality Date  . COLONOSCOPY WITH PROPOFOL N/A 02/05/2016   Procedure: COLONOSCOPY WITH PROPOFOL;  Surgeon: [name], MD;  Location: [place];  Service: Endoscopy;  Laterality: N/A;  . MOUTH SURGERY     orthodontic surgery for underbite  . MYRINGOTOMY WITH TUBE PLACEMENT    . NASAL SEPTUM SURGERY    . POLYPECTOMY N/A 02/05/2016   Procedure: POLYPECTOMY;  Surgeon: [name], MD;  Location: [place];  Service: Endoscopy;  Laterality: N/A;    There were no vitals filed for this visit.  Subjective Assessment - 06/18/17 0114    Subjective  Pt reported he is not noticed any changes to his Sx while he has been doing his exercises but pt agreed to continue with PT because the medications he has tried have not helped.      Pertinent History  Anal manometry showed dysynergia .  Sprained ankle L 2017 but received no Tx. Athlete's foot on both feet and the L foot swells more than R     Patient Stated Goals  go to work on a regular basis, exercise  regularly, live a normal life                    Pelvic Floor Special Questions - 06/18/17 0059    Diastasis Recti 3 fingers pre Tx,   1.5 fingers above umbilicus, 2 fingers below umbilicus post Tx     Pelvic Floor Internal Exam  -- pt consented verbally after being explained details of exam     Exam Type  Rectal    Palpation  increased tightness at 3 olcock and 6 oclock at puborectalis. and EAS , initally limited lengthening. Post Tx with cues for proper diaphragamtic excursion, pt demo'd improved pelvic floor lengthening.         [place] [facility] [place] Adult PT Treatment/Exercise - 06/18/17 0059      Ambulation/Gait   Gait Comments  decreased L stance phase, R trunk lean        Neuro Re-ed    Neuro Re-ed Details   see pt instructions       Manual Therapy  quadriped position: fascial release , trunk rotation/ UE flexion    Internal Pelvic Floor  intrarectal: STM at probelm areas indicated in assessment  and coordination training                     PT Long Term Goals - 06/18/17 0108      PT LONG TERM GOAL #1   Title  Pt will decrease his COREFO score from 22% to < 17% in order improve GI function and return to community events    Time  12    Period  Weeks    Status  On-going      PT LONG TERM GOAL #2   Title  Pt will demo decreased abdominal separation from 3 fingers width along linea alba to < 2 fingers in order to improve intraabdominal pressure for motility, postural stability,  and pelvic floor mobility    Time  6    Period  Weeks    Status  On-going      PT LONG TERM GOAL #3   Title  Pt will demo less R trunk lean and equal stance phase B in gait in order to walk with minimized risk for pelvic floor and spinal deficits     Time  4    Period  Weeks    Status  On-going      PT LONG TERM GOAL #4   Title  Pt will demo no pelvic obliquities across 2 visits in order to progress with deep core  strengthening exercises     Time  2    Period  Weeks    Status  Achieved      PT LONG TERM GOAL #5   Title  Pt will demo no puborectalis mm tensions and increased ability to lengthen pelvic floor mm immediately following contraction in order minimize mm spasms and promote improved bowel function    Time  10    Period  Weeks    Status  New    Target Date  08/27/17            Plan - 06/18/17 0100    Clinical Impression Statement  Pt showed increased tightness at puborectalis and external anal sphincter mm with delayed ability to length/relax pelvic floor following contraction. Following intrarectal manual Tx, pt demo'd decreased pelvic floor tightness and improved pelvic floor lengthening. Following manual Tx to address his diastasis recti, pt demo'd proper diaphragmatic and pevic floor and deep abdominal mm coordination. Pt tolerated Tx without complaints.  Pt continues to benefit from skilled PT.    Rehab Potential  Good    PT Frequency  1x / week    PT Duration  12 weeks    PT Treatment/Interventions  Gait training;Neuromuscular re-education;Functional mobility training;Electrical Stimulation;Therapeutic activities;Stair training;Patient/family education;Therapeutic exercise;Moist Heat;Energy conservation    Consulted and Agree with Plan of Care  Patient       Patient will benefit from skilled therapeutic intervention in order to improve the following deficits and impairments:  Hypomobility, Decreased endurance, Pain, Decreased strength, Difficulty walking, Decreased mobility, Decreased balance, Decreased range of motion, Decreased coordination, Postural dysfunction, Improper body mechanics  Visit Diagnosis: Other lack of coordination  Diastasis recti  Sacrococcygeal disorders, not elsewhere classified     Problem List Patient Active Problem List   Diagnosis Date Noted  . OCD (obsessive compulsive disorder) 01/01/2017  . Severe recurrent major depression without  psychotic features ([place]) 11/10/2015  . HLD (hyperlipidemia) 11/10/2014  . HTN (hypertension)  11/10/2014  . Adult hypothyroidism 11/10/2014  . 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/10/2014    Jerl Mina ,PT, DPT, E-RYT  06/18/2017, 1:23 AM  [place] MAIN [facility] SERVICES [address] [place], [place], [address] Phone: [phone]   Fax:  [phone]  Name: William Coleman. MRN: [id number] Date of Birth: Oct 06, 1960

## 2017-06-23 ENCOUNTER — Ambulatory Visit: Payer: BLUE CROSS/BLUE SHIELD | Admitting: Physical Therapy

## 2017-06-23 DIAGNOSIS — R278 Other lack of coordination: Secondary | ICD-10-CM | POA: Diagnosis not present

## 2017-06-23 DIAGNOSIS — M533 Sacrococcygeal disorders, not elsewhere classified: Secondary | ICD-10-CM | POA: Diagnosis not present

## 2017-06-23 DIAGNOSIS — M6208 Separation of muscle (nontraumatic), other site: Secondary | ICD-10-CM

## 2017-06-23 NOTE — Patient Instructions (Addendum)
To promote getting exercises into daily routine.  _deep core level 1 and 2    X 2 day   Morning and night    __________   standing marches 2 min during commericals x 2

## 2017-06-23 NOTE — Therapy (Signed)
[place] MAIN [facility] SERVICES [address] [place], [place], [address] Phone: [phone]   Fax:  [phone]  Physical Therapy Treatment  Patient Details  Name: William Coleman. MRN: [id number] Date of Birth: May 14, 1961 Referring Provider: Allen Norris    Encounter Date: 06/23/2017  PT End of Session - 06/23/17 1601    Visit Number  3    Number of Visits  12    Date for PT Re-Evaluation  08/19/17    PT Start Time  9528    PT Stop Time  1600    PT Time Calculation (min)  45 min    Activity Tolerance  No increased pain;Patient tolerated treatment well    Behavior During Therapy  [facility] for tasks assessed/performed       Past Medical History:  Diagnosis Date  . Anancastic neurosis   . Anginal pain ([place])   . Anxiety, generalized   . Bipolar disorder ([place])   . Chronic diarrhea   . Depression   . Hypertension   . Hypothyroidism   . Obsessive compulsive disorder   . OCD (obsessive compulsive disorder)   . Sleep apnea    patient states it was "marginal", no CPAP    Past Surgical History:  Procedure Laterality Date  . COLONOSCOPY WITH PROPOFOL N/A 02/05/2016   Procedure: COLONOSCOPY WITH PROPOFOL;  Surgeon: [name], MD;  Location: [place];  Service: Endoscopy;  Laterality: N/A;  . MOUTH SURGERY     orthodontic surgery for underbite  . MYRINGOTOMY WITH TUBE PLACEMENT    . NASAL SEPTUM SURGERY    . POLYPECTOMY N/A 02/05/2016   Procedure: POLYPECTOMY;  Surgeon: [name], MD;  Location: [place];  Service: Endoscopy;  Laterality: N/A;    There were no vitals filed for this visit.  Subjective Assessment - 06/23/17 1515    Subjective  Pt reports he is not seeing any changes to his Sx. Pt states he has not found the time to do his exercises.  Pt almost passed leakage onto his carpet or during the shower even after having a bowel movement.     Pertinent History  Anal manometry showed dysynergia .  Sprained  ankle L 2017 but received no Tx. Athlete's foot on both feet and the L foot swells more than R     Patient Stated Goals  go to work on a regular basis, exercise regularly, live a normal life          [facility] PT Assessment - 06/23/17 1544      Observation/Other Assessments   Observations  requried cues for deep core level  2               Pelvic Floor Special Questions - 06/23/17 1542    Diastasis Recti  1.5 fingers above umbilicus, 2 fingers below umbilicus post Tx         [facility] Adult PT Treatment/Exercise - 06/23/17 1545      Neuro Re-ed    Neuro Re-ed Details   see pt instructions  plus PNF D1 flex 20 rep, red band B to increase oblique activation seated. Standing marches 2 min x 2 reps . This exercise not placed into HEP.  Maintaining previous HEP to promote compliance               PT Education - 06/23/17 1546    Education provided  Yes    Education Details  HEP    Person(s) Educated  Patient  Methods  Explanation;Demonstration;Tactile cues;Verbal cues    Comprehension  Returned demonstration;Verbalized understanding          PT Long Term Goals - 06/18/17 0108      PT LONG TERM GOAL #1   Title  Pt will decrease his COREFO score from 22% to < 17% in order improve GI function and return to community events    Time  12    Period  Weeks    Status  On-going      PT LONG TERM GOAL #2   Title  Pt will demo decreased abdominal separation from 3 fingers width along linea alba to < 2 fingers in order to improve intraabdominal pressure for motility, postural stability,  and pelvic floor mobility    Time  6    Period  Weeks    Status  On-going      PT LONG TERM GOAL #3   Title  Pt will demo less R trunk lean and equal stance phase B in gait in order to walk with minimized risk for pelvic floor and spinal deficits     Time  4    Period  Weeks    Status  On-going      PT LONG TERM GOAL #4   Title  Pt will demo no pelvic obliquities across 2 visits in  order to progress with deep core strengthening exercises     Time  2    Period  Weeks    Status  Achieved      PT LONG TERM GOAL #5   Title  Pt will demo no puborectalis mm tensions and increased ability to lengthen pelvic floor mm immediately following contraction in order minimize mm spasms and promote improved bowel function    Time  10    Period  Weeks    Status  New    Target Date  08/27/17            Plan - 06/23/17 1547    Clinical Impression Statement  Pt requried review of HEP and motivational interviewing to promote compliance to HEP.  Pt was educated on completing food diary. Pt was educated on the importance of compliance to exercise for better outcomes. Added to Tx today  PNF D1 flex 20 rep, red band B to increase oblique activation seated. This exercise not placed into HEP.  Kept with previous HEP to promote compliance and added standing marching 2 min x 2 reps . Pt continues to show improved labdominal separation and will benefit from skilled PT.     Rehab Potential  Good    PT Frequency  1x / week    PT Duration  12 weeks    PT Treatment/Interventions  Gait training;Neuromuscular re-education;Functional mobility training;Electrical Stimulation;Therapeutic activities;Stair training;Patient/family education;Therapeutic exercise;Moist Heat;Energy conservation    Consulted and Agree with Plan of Care  Patient       Patient will benefit from skilled therapeutic intervention in order to improve the following deficits and impairments:  Hypomobility, Decreased endurance, Pain, Decreased strength, Difficulty walking, Decreased mobility, Decreased balance, Decreased range of motion, Decreased coordination, Postural dysfunction, Improper body mechanics  Visit Diagnosis: Other lack of coordination  Diastasis recti  Sacrococcygeal disorders, not elsewhere classified     Problem List Patient Active Problem List   Diagnosis Date Noted  . OCD (obsessive compulsive  disorder) 01/01/2017  . Severe recurrent major depression without psychotic features ([place]) 11/10/2015  . HLD (hyperlipidemia) 11/10/2014  . HTN (hypertension) 11/10/2014  . Adult  hypothyroidism 11/10/2014  . 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/10/2014    Jerl Mina ,PT, DPT, E-RYT  06/23/2017, 4:05 PM  [place] MAIN [facility] SERVICES [address] [place], [place], [address] Phone: [phone]   Fax:  [phone]  Name: William Coleman. MRN: [id number] Date of Birth: May 17, 1961

## 2017-06-26 ENCOUNTER — Telehealth: Payer: Self-pay | Admitting: Gastroenterology

## 2017-06-26 NOTE — Telephone Encounter (Signed)
Dr. Janee Morn PT left a voice message that the patient wants to do PT but is afraid he will mess his pants. Please call her (she didn't leave her name) to discuss phyco therpy [phone]

## 2017-06-27 ENCOUNTER — Telehealth: Payer: Self-pay | Admitting: Physical Therapy

## 2017-06-27 NOTE — Telephone Encounter (Signed)
Returned PT's call but she was not available this afternoon. I will call back Monday morning.

## 2017-06-27 NOTE — Telephone Encounter (Signed)
Pt informed PT on the phone the morning of 06/26/17 that his bowel Sx are "so bad" and he is not able to do his exercises because he is afraid he will mess up his pants. Pt expressed that he is willing to keep trying PT and knows the exercises from PT are simple to do. Pt has tried to call Dr. Allen Norris to inform him that his Sx are not improving. PT reiterated that PT exercises take time to have positive effects on h is Sx. PT voiced to pt that PT will communicate with Dr. Allen Norris about his situation. Pt had to end the conversation due to needing to go to the bathroom.  Pt voiced he plans to come to his next PT session.   PT left a message at Dr. Dorothey Baseman office (06/26/17@12pm ) stating details above. PT suggested pt may benefit from working with a psychotherapist in addition to PT in order to receive support for anxiety issues related to his bowel Sx and Hx of OCD.

## 2017-06-30 ENCOUNTER — Ambulatory Visit: Payer: BLUE CROSS/BLUE SHIELD | Admitting: Physical Therapy

## 2017-06-30 DIAGNOSIS — M533 Sacrococcygeal disorders, not elsewhere classified: Secondary | ICD-10-CM | POA: Diagnosis not present

## 2017-06-30 DIAGNOSIS — M6208 Separation of muscle (nontraumatic), other site: Secondary | ICD-10-CM | POA: Diagnosis not present

## 2017-06-30 DIAGNOSIS — R278 Other lack of coordination: Secondary | ICD-10-CM

## 2017-06-30 NOTE — Patient Instructions (Signed)
Nutritional services at [facility] to address ....  Medical Nutrition Therapy Address: [address] [place], [place], [place] [address]  [phone]   [email or web address] [facility] Location  [phone] [address] [place], [place] [address] Get Directions

## 2017-06-30 NOTE — Telephone Encounter (Signed)
Left vm with PT for a return call.

## 2017-07-01 NOTE — Telephone Encounter (Signed)
Left vm again today for PT to return my call.

## 2017-07-02 ENCOUNTER — Encounter: Payer: Self-pay | Admitting: Family Medicine

## 2017-07-02 ENCOUNTER — Ambulatory Visit (INDEPENDENT_AMBULATORY_CARE_PROVIDER_SITE_OTHER): Payer: BLUE CROSS/BLUE SHIELD | Admitting: Family Medicine

## 2017-07-02 VITALS — BP 120/74 | HR 70 | Temp 97.9°F | Resp 16 | Ht 67.0 in | Wt 149.0 lb

## 2017-07-02 DIAGNOSIS — Z125 Encounter for screening for malignant neoplasm of prostate: Secondary | ICD-10-CM

## 2017-07-02 DIAGNOSIS — Z0000 Encounter for general adult medical examination without abnormal findings: Secondary | ICD-10-CM

## 2017-07-02 DIAGNOSIS — Z23 Encounter for immunization: Secondary | ICD-10-CM

## 2017-07-02 NOTE — Therapy (Signed)
[place] MAIN [facility] SERVICES [address] [place], [place], [address] Phone: [phone]   Fax:  [phone]  Physical Therapy Treatment  Patient Details  Name: William Coleman. MRN: [id number] Date of Birth: 24-Aug-1960 Referring Provider: Allen Norris    Encounter Date: 06/30/2017  PT End of Session - 07/02/17 1010    Visit Number  4    Number of Visits  12    Date for PT Re-Evaluation  08/19/17    PT Start Time  2505    PT Stop Time  1535    PT Time Calculation (min)  20 min    Activity Tolerance  No increased pain;Patient tolerated treatment well    Behavior During Therapy  [facility] for tasks assessed/performed       Past Medical History:  Diagnosis Date  . Anancastic neurosis   . Anginal pain ([place])   . Anxiety, generalized   . Bipolar disorder ([place])   . Chronic diarrhea   . Depression   . Hypertension   . Hypothyroidism   . Obsessive compulsive disorder   . OCD (obsessive compulsive disorder)   . Sleep apnea    patient states it was "marginal", no CPAP    Past Surgical History:  Procedure Laterality Date  . COLONOSCOPY WITH PROPOFOL N/A 02/05/2016   Procedure: COLONOSCOPY WITH PROPOFOL;  Surgeon: [name], MD;  Location: [place];  Service: Endoscopy;  Laterality: N/A;  . MOUTH SURGERY     orthodontic surgery for underbite  . MYRINGOTOMY WITH TUBE PLACEMENT    . NASAL SEPTUM SURGERY    . POLYPECTOMY N/A 02/05/2016   Procedure: POLYPECTOMY;  Surgeon: [name], MD;  Location: [place];  Service: Endoscopy;  Laterality: N/A;    There were no vitals filed for this visit.  Subjective Assessment - 07/02/17 1017    Subjective  Pt reports his Sx are bad that he can not do his PT exercises. Pt felt like he will soil his pants. Pt brought back his food diary. Pt stated he eats out alot     Pertinent History  Anal manometry showed dysynergia .  Sprained ankle L 2017 but received no Tx. Athlete's foot on  both feet and the L foot swells more than R     Patient Stated Goals  go to work on a regular basis, exercise regularly, live a normal life          [facility] PT Assessment - 07/02/17 1014      Observation/Other Assessments   Observations  less slumped posture. Food diary returned with indication of minimal fiber, skipping breakfast daily                Pelvic Floor Special Questions - 07/02/17 1012    Diastasis Recti  increased closure of rectus mm below umbilicus, slight bluge below sternum         [facility] Adult PT Treatment/Exercise - 07/02/17 1015      Therapeutic Activites    Therapeutic Activities  -- discussed referral to nutritionist, recommended general info                  PT Long Term Goals - 06/18/17 0108      PT LONG TERM GOAL #1   Title  Pt will decrease his COREFO score from 22% to < 17% in order improve GI function and return to community events    Time  12    Period  Weeks  Status  On-going      PT LONG TERM GOAL #2   Title  Pt will demo decreased abdominal separation from 3 fingers width along linea alba to < 2 fingers in order to improve intraabdominal pressure for motility, postural stability,  and pelvic floor mobility    Time  6    Period  Weeks    Status  On-going      PT LONG TERM GOAL #3   Title  Pt will demo less R trunk lean and equal stance phase B in gait in order to walk with minimized risk for pelvic floor and spinal deficits     Time  4    Period  Weeks    Status  On-going      PT LONG TERM GOAL #4   Title  Pt will demo no pelvic obliquities across 2 visits in order to progress with deep core strengthening exercises     Time  2    Period  Weeks    Status  Achieved      PT LONG TERM GOAL #5   Title  Pt will demo no puborectalis mm tensions and increased ability to lengthen pelvic floor mm immediately following contraction in order minimize mm spasms and promote improved bowel function    Time  10    Period  Weeks     Status  New    [place] Date  08/27/17            Plan - 07/02/17 1012    Clinical Impression Statement  Pt voiced he is unable to perform his exercises because he is afraid of soiling his pants. Provided motivational interviewing to further undestand barriers. Pt returned his food diary which showed minimal intake of fiber in addition to skipping breakfast. Pt was provided general information about healthy eating ( increase fiber with salads, fruit more consistently and to have breakfast with oatmeal/ yogurt/ fruit). Info on specific amounts was deferred nutritionist. Pt was provided contact info for two nutritionists. Pt voiced agreement to work with a nutritionist. Pt will follow up with insurance coverage. Pt showed less abdominal separation and improved upright posture today. Pt's GI doctor was notified for his barriers to PT.  Plan is to provide pt more time to make behavorial changes through healthier eating as he works with a nutritionist and then progress pt 's PT exercises.  Pt declined psychotherapy services.  Pt continues to benefit from skilled PT but will require biopsychosocial approaches to yield better outcomes.     Rehab Potential  Good    PT Frequency  1x / week    PT Duration  12 weeks    PT Treatment/Interventions  Gait training;Neuromuscular re-education;Functional mobility training;Electrical Stimulation;Therapeutic activities;Stair training;Patient/family education;Therapeutic exercise;Moist Heat;Energy conservation    Consulted and Agree with Plan of Care  Patient       Patient will benefit from skilled therapeutic intervention in order to improve the following deficits and impairments:  Hypomobility, Decreased endurance, Pain, Decreased strength, Difficulty walking, Decreased mobility, Decreased balance, Decreased range of motion, Decreased coordination, Postural dysfunction, Improper body mechanics  Visit Diagnosis: Other lack of coordination  Diastasis  recti  Sacrococcygeal disorders, not elsewhere classified     Problem List Patient Active Problem List   Diagnosis Date Noted  . OCD (obsessive compulsive disorder) 01/01/2017  . Severe recurrent major depression without psychotic features ([place]) 11/10/2015  . HLD (hyperlipidemia) 11/10/2014  . HTN (hypertension) 11/10/2014  . Adult hypothyroidism 11/10/2014  .  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/10/2014    Jerl Mina ,PT, DPT, E-RYT  07/02/2017, 10:43 AM  [place] MAIN [facility] SERVICES [address] [place], [place], [address] Phone: [phone]   Fax:  [phone]  Name: Lamone Ferrelli. MRN: [id number] Date of Birth: 1961-04-13

## 2017-07-02 NOTE — Progress Notes (Signed)
Patient: William Steinberger., Male    DOB: [birth year], 57 y.o.   MRN: [id number] Visit Date: 07/02/2017  Today's Provider: Wilhemena Durie, MD   Chief Complaint  Patient presents with  . Annual Exam   Subjective:    Annual physical exam William Brawn. is a 57 y.o. male who presents today for health maintenance and complete physical. He feels well. He reports exercising, some with PT for his pelvic floor weakness. He reports he is sleeping well.  ----------------------------------------------------------------- Colonoscopy- 02/05/16 hyperplastic polyps Dr. Allen Norris, hemorrhoids repeat 2027   Review of Systems  Constitutional: Positive for fatigue.  HENT: Positive for congestion, hearing loss, postnasal drip and rhinorrhea.   Eyes: Negative.   Respiratory: Positive for cough.   Cardiovascular: Negative.   Gastrointestinal: Positive for diarrhea.  Endocrine: Negative.   Genitourinary: Negative.   Musculoskeletal: Negative.   Skin: Negative.   Allergic/Immunologic: Positive for environmental allergies.  Neurological: Negative.   Hematological: Negative.   Psychiatric/Behavioral: Negative.     Social History      He  reports that  has never smoked. he has never used smokeless tobacco. He reports that he drinks alcohol. He reports that he does not use drugs.       Social History   Socioeconomic History  . Marital status: Single    Spouse name: None  . Number of children: None  . Years of education: None  . Highest education level: None  Social Needs  . Financial resource strain: None  . Food insecurity - worry: None  . Food insecurity - inability: None  . Transportation needs - medical: None  . Transportation needs - non-medical: None  Occupational History  . None  Tobacco Use  . Smoking status: Never Smoker  . Smokeless tobacco: Never Used  Substance and Sexual Activity  . Alcohol use: Yes    Comment: rarely  . Drug use: No  . Sexual  activity: No    Birth control/protection: Abstinence  Other Topics Concern  . None  Social History Narrative  . None    Past Medical History:  Diagnosis Date  . Anancastic neurosis   . Anginal pain ([place])   . Anxiety, generalized   . Bipolar disorder ([place])   . Chronic diarrhea   . Depression   . Hypertension   . Hypothyroidism   . Obsessive compulsive disorder   . OCD (obsessive compulsive disorder)   . Sleep apnea    patient states it was "marginal", no CPAP     Patient Active Problem List   Diagnosis Date Noted  . OCD (obsessive compulsive disorder) 01/01/2017  . Severe recurrent major depression without psychotic features ([place]) 11/10/2015  . HLD (hyperlipidemia) 11/10/2014  . HTN (hypertension) 11/10/2014  . Adult hypothyroidism 11/10/2014  . 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/10/2014    Past Surgical History:  Procedure Laterality Date  . COLONOSCOPY WITH PROPOFOL N/A 02/05/2016   Procedure: COLONOSCOPY WITH PROPOFOL;  Surgeon: [name], MD;  Location: [place];  Service: Endoscopy;  Laterality: N/A;  . MOUTH SURGERY     orthodontic surgery for underbite  . MYRINGOTOMY WITH TUBE PLACEMENT    . NASAL SEPTUM SURGERY    . POLYPECTOMY N/A 02/05/2016   Procedure: POLYPECTOMY;  Surgeon: [name], MD;  Location: [place];  Service: Endoscopy;  Laterality: N/A;    Family History        Family Status  Relation Name Status  . Mother  Deceased  .  Father  Deceased  . Sister  Alive        His family history includes Anxiety disorder in his mother; Congestive Heart Failure in his father; Depression in his mother; Hypertension in his father; Prostate cancer in his father.     Allergies  Allergen Reactions  . Amoxicillin Hives  . Penicillins   . Sulfa Antibiotics      Current Outpatient Medications:  .  amLODipine (NORVASC) 5 MG tablet, Take 1 tablet (5 mg total) by mouth daily., Disp: 90 tablet, Rfl: 3 .  aspirin 81 MG tablet, Take 81 mg by mouth  daily., Disp: , Rfl:  .  diphenoxylate-atropine (LOMOTIL) 2.5-0.025 MG tablet, Take 1 tablet by mouth 4 (four) times daily as needed for diarrhea or loose stools., Disp: 90 tablet, Rfl: 5 .  fexofenadine (ALLEGRA ALLERGY) 180 MG tablet, Take 1 tablet (180 mg total) by mouth daily., Disp: 30 tablet, Rfl: 5 .  levothyroxine (SYNTHROID, LEVOTHROID) 25 MCG tablet, , Disp: , Rfl: 1 .  Multiple Vitamin (MULTIVITAMIN WITH MINERALS) TABS tablet, Take 1 tablet by mouth daily., Disp: , Rfl:  .  rosuvastatin (CRESTOR) 10 MG tablet, Take 1 tablet (10 mg total) by mouth daily., Disp: 90 tablet, Rfl: 3 .  VIAGRA 50 MG tablet, Take 50 mg by mouth as needed for erectile dysfunction. , Disp: , Rfl: 1 .  VIBERZI 100 MG TABS, Take 100 mg by mouth 2 (two) times daily. , Disp: , Rfl: 4 .  Azelastine-Fluticasone (DYMISTA) 137-50 MCG/ACT SUSP, Place 2 sprays into the nose daily. (Patient not taking: Reported on 05/26/2017), Disp: 1 Bottle, Rfl: 10 .  levothyroxine (SYNTHROID, LEVOTHROID) 25 MCG tablet, Take 1 tablet (25 mcg total) by mouth daily., Disp: 90 tablet, Rfl: 0   Patient Care Team: Jerrol Banana., MD as PCP - General (Family Medicine)      Objective:   Vitals: BP 120/74 (BP Location: Left Arm, Patient Position: Sitting, Cuff Size: Normal)   Pulse 70   Temp 97.9 F (36.6 C) (Oral)   Resp 16   Ht 5\' 7"  (1.702 m)   Wt 149 lb (67.6 kg)   BMI 23.34 kg/m    Vitals:   07/02/17 0950  BP: 120/74  Pulse: 70  Resp: 16  Temp: 97.9 F (36.6 C)  TempSrc: Oral  Weight: 149 lb (67.6 kg)  Height: 5\' 7"  (1.702 m)     Physical Exam  Constitutional: He is oriented to person, place, and time. He appears well-developed and well-nourished.  HENT:  Head: Normocephalic and atraumatic.  Right Ear: External ear normal.  Left Ear: External ear normal.  Nose: Nose normal.  Mouth/Throat: Oropharynx is clear and moist.  Eyes: Conjunctivae are normal. No scleral icterus.  Neck: No thyromegaly present.    Cardiovascular: Normal rate, regular rhythm, normal heart sounds and intact distal pulses.  Pulmonary/Chest: Effort normal and breath sounds normal.  Abdominal: Soft.  Musculoskeletal: Normal range of motion.  Lymphadenopathy:    He has no cervical adenopathy.  Neurological: He is alert and oriented to person, place, and time.  Skin: Skin is warm and dry.  Psychiatric: He has a normal mood and affect. His behavior is normal. Judgment and thought content normal.     Depression Screen PHQ 2/9 Scores 07/02/2017 01/16/2017  PHQ - 2 Score 3 6  PHQ- 9 Score 9 12      Assessment & Plan:     Routine Health Maintenance and Physical Exam  Exercise Activities and  Dietary recommendations Goals    None      Immunization History  Administered Date(s) Administered  . Influenza Split 03/23/2010, 02/26/2016  . Influenza,inj,Quad PF,6+ Mos 03/07/2014, 03/10/2017  . Tdap 07/24/2007    Health Maintenance  Topic Date Due  . Hepatitis C Screening  1960/09/13  . TETANUS/TDAP  07/23/2017  . COLONOSCOPY  02/04/2026  . INFLUENZA VACCINE  Completed  . HIV Screening  Completed     Discussed health benefits of physical activity, and encouraged him to engage in regular exercise appropriate for his age and condition.  Atypical Nevi Refer to derm--Dr Darrick Huntsman. MDD wih OCD AR    --------------------------------------------------------------------  I have done the exam and reviewed the above chart and it is accurate to the best of my knowledge. Development worker, community has been used in this note in any air is in the dictation or transcription are unintentional.   Wilhemena Durie, MD  [place]

## 2017-07-03 ENCOUNTER — Telehealth: Payer: Self-pay

## 2017-07-03 ENCOUNTER — Other Ambulatory Visit: Payer: Self-pay | Admitting: Family Medicine

## 2017-07-03 DIAGNOSIS — E039 Hypothyroidism, unspecified: Secondary | ICD-10-CM

## 2017-07-03 LAB — CBC WITH DIFFERENTIAL/PLATELET
Basophils Absolute: 0.1 10*3/uL (ref 0.0–0.2)
Basos: 1 %
EOS (ABSOLUTE): 0.4 10*3/uL (ref 0.0–0.4)
EOS: 4 %
HEMATOCRIT: 42.6 % (ref 37.5–51.0)
HEMOGLOBIN: 14.9 g/dL (ref 13.0–17.7)
IMMATURE GRANS (ABS): 0 10*3/uL (ref 0.0–0.1)
Immature Granulocytes: 0 %
LYMPHS ABS: 1.5 10*3/uL (ref 0.7–3.1)
LYMPHS: 17 %
MCH: 31.4 pg (ref 26.6–33.0)
MCHC: 35 g/dL (ref 31.5–35.7)
MCV: 90 fL (ref 79–97)
MONOCYTES: 8 %
Monocytes Absolute: 0.7 10*3/uL (ref 0.1–0.9)
NEUTROS ABS: 6 10*3/uL (ref 1.4–7.0)
Neutrophils: 70 %
Platelets: 287 10*3/uL (ref 150–379)
RBC: 4.75 x10E6/uL (ref 4.14–5.80)
RDW: 13.4 % (ref 12.3–15.4)
WBC: 8.5 10*3/uL (ref 3.4–10.8)

## 2017-07-03 LAB — COMPREHENSIVE METABOLIC PANEL
A/G RATIO: 1.8 (ref 1.2–2.2)
ALBUMIN: 4.8 g/dL (ref 3.5–5.5)
ALK PHOS: 64 IU/L (ref 39–117)
ALT: 38 IU/L (ref 0–44)
AST: 35 IU/L (ref 0–40)
BILIRUBIN TOTAL: 0.6 mg/dL (ref 0.0–1.2)
BUN / CREAT RATIO: 19 (ref 9–20)
BUN: 19 mg/dL (ref 6–24)
CO2: 26 mmol/L (ref 20–29)
Calcium: 9.7 mg/dL (ref 8.7–10.2)
Chloride: 101 mmol/L (ref 96–106)
Creatinine, Ser: 1 mg/dL (ref 0.76–1.27)
GFR calc non Af Amer: 84 mL/min/{1.73_m2} (ref >59)
GFR, EST AFRICAN AMERICAN: 98 mL/min/{1.73_m2} (ref >59)
Globulin, Total: 2.6 g/dL (ref 1.5–4.5)
Glucose: 95 mg/dL (ref 65–99)
POTASSIUM: 3.3 mmol/L — AB (ref 3.5–5.2)
SODIUM: 145 mmol/L — AB (ref 134–144)
TOTAL PROTEIN: 7.4 g/dL (ref 6.0–8.5)

## 2017-07-03 LAB — LIPID PANEL
CHOL/HDL RATIO: 2.5 ratio (ref 0.0–5.0)
CHOLESTEROL TOTAL: 134 mg/dL (ref 100–199)
HDL: 53 mg/dL (ref >39)
LDL CALC: 66 mg/dL (ref 0–99)
TRIGLYCERIDES: 76 mg/dL (ref 0–149)
VLDL CHOLESTEROL CAL: 15 mg/dL (ref 5–40)

## 2017-07-03 LAB — TSH: TSH: 0.655 u[IU]/mL (ref 0.450–4.500)

## 2017-07-03 LAB — PSA: Prostate Specific Ag, Serum: 1.1 ng/mL (ref 0.0–4.0)

## 2017-07-03 MED ORDER — LEVOTHYROXINE SODIUM 25 MCG PO TABS: 25.0000 ug | ORAL_TABLET | Freq: Every day | ORAL | 3 refills | Status: DC

## 2017-07-03 NOTE — Telephone Encounter (Signed)
[facility] faxed a refill request for the following medication. Thanks CC  levothyroxine (SYNTHROID, LEVOTHROID) 25 MCG tablet

## 2017-07-03 NOTE — Telephone Encounter (Signed)
-----   Message from Jerrol Banana., MD sent at 07/03/2017 11:00 AM EST ----- Labs stable--K slightly low--eat foods high in potassium.

## 2017-07-03 NOTE — Telephone Encounter (Signed)
[facility]  ED   ----- Message from Jerrol Banana., MD sent at 07/03/2017 11:00 AM EST ----- Labs stable--K slightly low--eat foods high in potassium.

## 2017-07-03 NOTE — Telephone Encounter (Signed)
Advised  ED 

## 2017-07-07 ENCOUNTER — Telehealth: Payer: Self-pay | Admitting: Physical Therapy

## 2017-07-07 ENCOUNTER — Ambulatory Visit: Payer: BLUE CROSS/BLUE SHIELD | Admitting: Physical Therapy

## 2017-07-07 NOTE — Telephone Encounter (Signed)
PT called pt and educated pt on coordinating pelvic floor mm contraction during coughing in order to decrease strain/weakness on the pelvic floor muscles. Pt stated he has had coughing and sinus issues all of his life. Pt was able to voice back that his understanding to activate pelvic floor opposite to releasing bowels. PT explained that more will be covered at his upcoming session regarding this coordination technique because it may be helpful in addressing his fecal leakage.

## 2017-07-07 NOTE — Telephone Encounter (Signed)
PT received a voicemail message from pt on 07/07/17 requesting PT contact Dr. Dorothey Baseman office to report that his Sx are not improving. Pt also reported that it has been difficult to perform his exercises and to have breakfast in the morning due to his Sx. Pt expressed frustration about his lack of progress. Pt also reported his fecal Sx and sinus issues are bothering him. Pt coughed excessively throughout the message.   PT phoned Dr. Dorothey Baseman office and spoke with Ginger 07/07/17  @ 11am who was made aware of the details above. PT requested pt be contacted. Ginger voiced agreement.   PT phoned pt at 1:30pm and pt was explained Dr.Wohl's office has been notified. Discussed with pt: 1) that he can try to perform his exercises in the afternoon instead of the morning given his difficulty.  2) Reemphasized with pt to increase fiber through food sources moderately and to follow up with a nutritionist. Pt voiced he will call his insurance about nutrition coverage.  3) Follow up with doctor about sinus/ coughing issues.   Pt voiced understanding and voiced back the three action items above.   Pt agreed to come to his next appointment on 07/21/17

## 2017-07-10 IMAGING — CR DG CHEST 2V
1 series · 2 of 2 positions shown · non-contrast
Comparison: None.

CLINICAL DATA: Cough, congestion, short of breath

EXAM:
CHEST  2 VIEW

[Series 1: dg chest 2 view · 0.14mm/px · 2 of 2 slices shown]
[im 1/2]
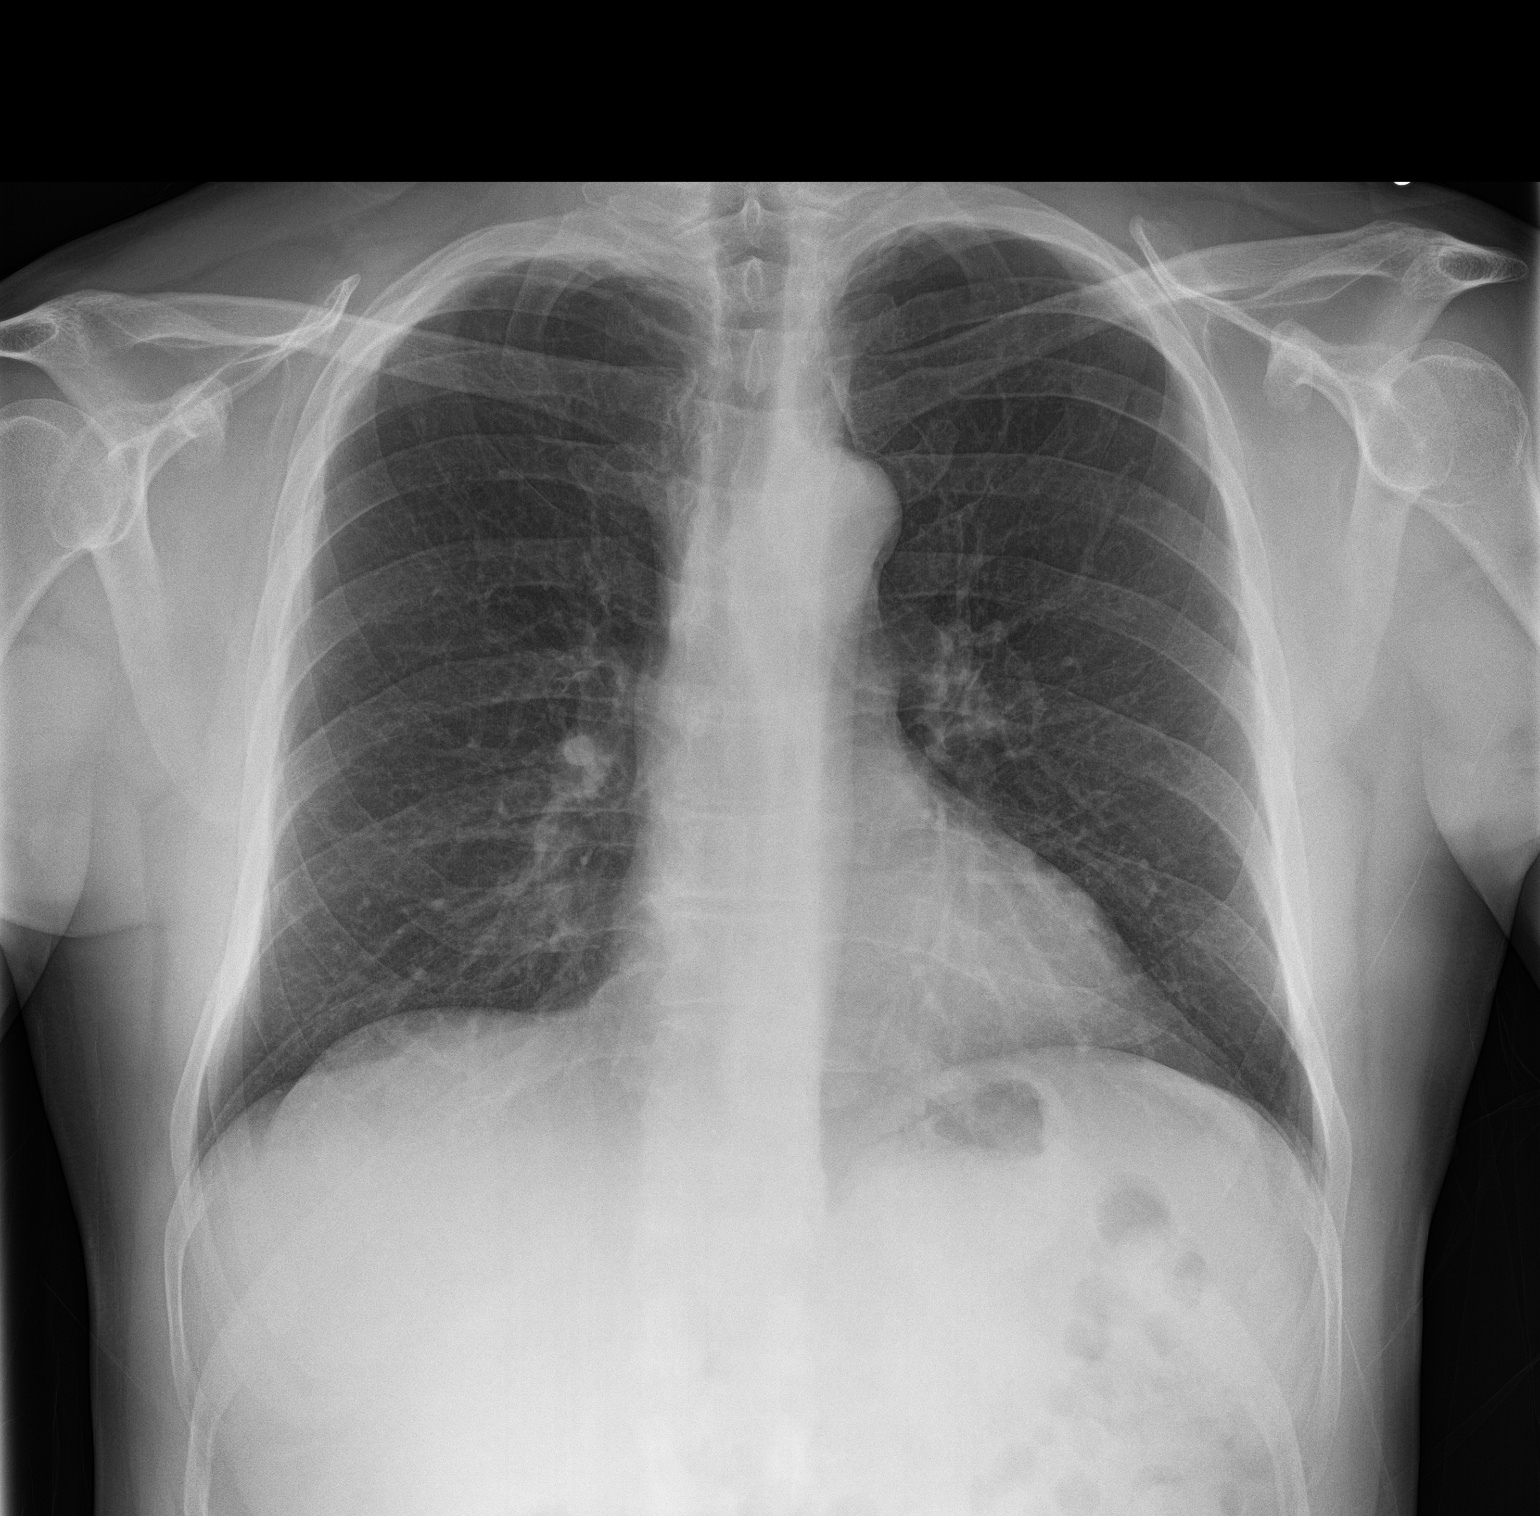
[im 2/2]
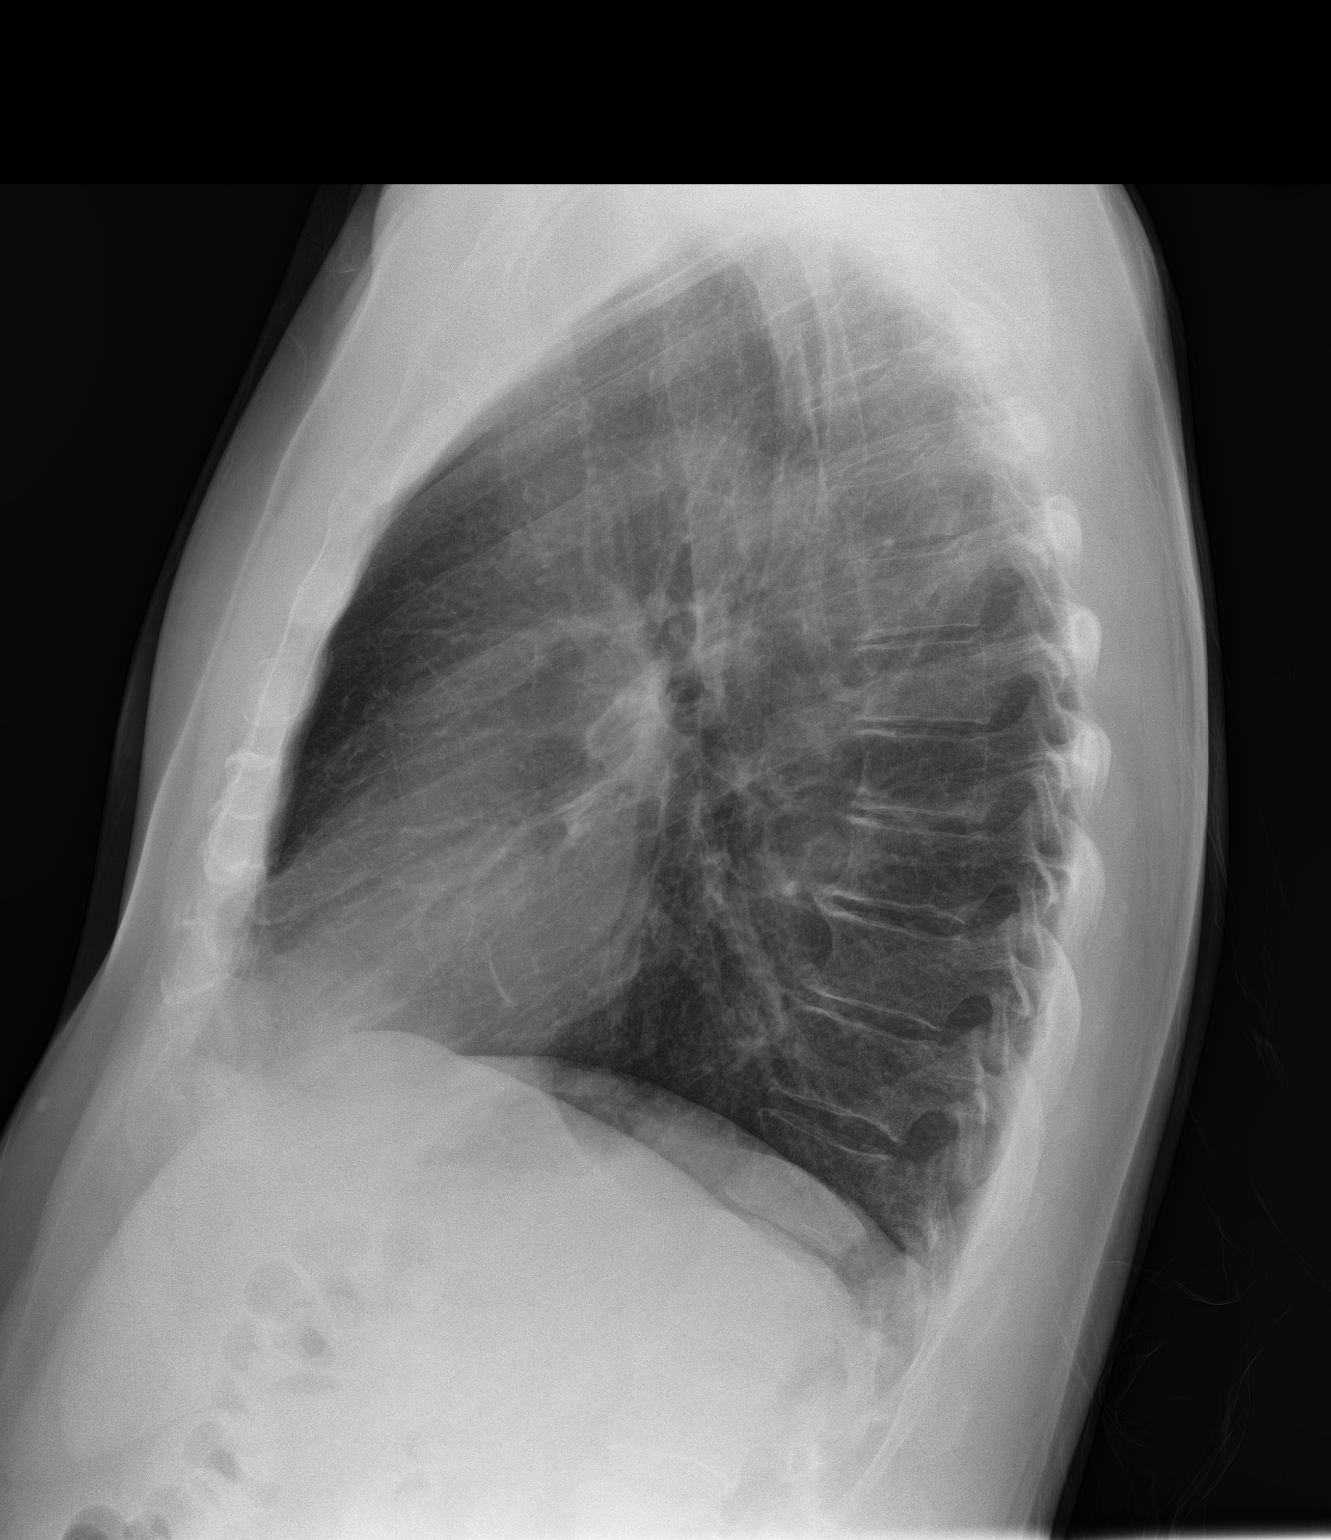

[2 of 2 positions shown; findings below may reference images not displayed]

FINDINGS: Normal heart size. Lungs clear. No pneumothorax. No pleural
effusion.
IMPRESSION: No active cardiopulmonary disease.

## 2017-07-15 ENCOUNTER — Other Ambulatory Visit: Payer: Self-pay

## 2017-07-15 ENCOUNTER — Telehealth: Payer: Self-pay

## 2017-07-15 DIAGNOSIS — M6289 Other specified disorders of muscle: Secondary | ICD-10-CM

## 2017-07-15 DIAGNOSIS — D225 Melanocytic nevi of trunk: Secondary | ICD-10-CM | POA: Diagnosis not present

## 2017-07-15 NOTE — Telephone Encounter (Signed)
Pt called requesting a referral to [facility]. Referral has been sent.

## 2017-07-17 ENCOUNTER — Telehealth: Payer: Self-pay | Admitting: Gastroenterology

## 2017-07-17 NOTE — Telephone Encounter (Signed)
Patient called and wanted to make an appt with Dr. Allen Norris. Said symptoms are worse. Did we refer him out?

## 2017-07-17 NOTE — Telephone Encounter (Signed)
Yes, but Dr. Allen Norris said to go ahead and schedule a follow up appt.

## 2017-07-18 ENCOUNTER — Telehealth: Payer: Self-pay | Admitting: Gastroenterology

## 2017-07-18 NOTE — Telephone Encounter (Signed)
Left voice message for patient to call and schedule a follow up with Dr. Allen Norris. Ginger said March was fine

## 2017-07-21 ENCOUNTER — Ambulatory Visit: Payer: BLUE CROSS/BLUE SHIELD | Attending: Gastroenterology | Admitting: Physical Therapy

## 2017-07-21 DIAGNOSIS — M6208 Separation of muscle (nontraumatic), other site: Secondary | ICD-10-CM

## 2017-07-21 DIAGNOSIS — R278 Other lack of coordination: Secondary | ICD-10-CM | POA: Insufficient documentation

## 2017-07-21 DIAGNOSIS — M533 Sacrococcygeal disorders, not elsewhere classified: Secondary | ICD-10-CM | POA: Diagnosis not present

## 2017-07-21 NOTE — Patient Instructions (Addendum)
Consider sleep study to screen for sleep apnea Research articles below given to you  To read on the association of sinus issues and sleep apnea    Https://erj.ersjournals.com/content/40/Suppl_56/P3830  Pite et al. Allergic rhinitis' impact on obstructive sleep apnoea (OSA). European Respiratory Journal 2012 40: E3154.    _____________  [email or web address]  Curr Allergy Asthma Rep. 2004 May;4(3):193-9.  Rhinitis and sleep apnea. Staevska MT1, Mandajieva MA, Dimitrov VD.   ____________  PELVIC FLOOR / KEGEL EXERCISES   Pelvic floor/ Kegel exercises are used to strengthen the muscles in the base of your pelvis that are responsible for supporting your pelvic organs and preventing urine/feces leakage. Based on your therapist's recommendations, they can be performed while standing, sitting, or lying down.  Make yourself aware of this muscle group by using these cues:  Imagine you are in a crowded room and you feel the need to pass gas. Your response is to pull up and in at the rectum.  Close the rectum. Pull the muscles up inside your body,feeling your scrotum lifting as well . Feel the pelvic floor muscles lift as if you were walking into a cold lake.  Place your hand on top of your pubic bone. Tighten and draw in the muscles around the anal muscles without squeezing the buttock muscles.  Common Errors:  Breath holding: If you are holding your breath, you may be bearing down against your bladder instead of pulling it up. If you belly bulges up while you are squeezing, you are holding your breath. Be sure to breathe gently in and out while exercising. Counting out loud may help you avoid holding your breath.  Accessory muscle use: You should not see or feel other muscle movement when performing pelvic floor exercises. When done properly, no one can tell that you are performing the exercises. Keep the buttocks, belly and inner thighs relaxed.  Overdoing it:  Your muscles can fatigue and stop working for you if you over-exercise. You may actually leak more or feel soreness at the lower abdomen or rectum.  YOUR HOME EXERCISE PROGRAM   SHORT HOLDS: Position: on back, sitting   Inhale and then exhale. Then squeeze the muscle.  (Be sure to let belly sink in with exhales and not push outward)  Perform 5 repetitions, 5  Times/day  **ALSO SQUEEZE BEFORE YOUR  COUGH to decrease downward pressure   ___________  Keep up with deep core level 2 ( knee out) move knee out 10 deg without wobbling of hips

## 2017-07-24 ENCOUNTER — Other Ambulatory Visit: Payer: Self-pay

## 2017-07-24 MED ORDER — DIPHENOXYLATE-ATROPINE 2.5-0.025 MG PO TABS: 1.0000 | ORAL_TABLET | Freq: Four times a day (QID) | ORAL | 5 refills | Status: DC | PRN

## 2017-07-29 DIAGNOSIS — F429 Obsessive-compulsive disorder, unspecified: Secondary | ICD-10-CM | POA: Diagnosis not present

## 2017-07-29 NOTE — Therapy (Addendum)
[place] MAIN [facility] SERVICES [address] [place], [place], [address] Phone: [phone]   Fax:  [phone]  Physical Therapy Treatment  Patient Details  Name: William Coleman. MRN: [id number] Date of Birth: [birth year] Referring Provider: Allen Norris    Encounter Date: 07/21/2017  PT End of Session - 07/29/17 1348    Visit Number  4    Number of Visits  12    Date for PT Re-Evaluation  08/19/17    PT Start Time  1500    PT Stop Time  1600    PT Time Calculation (min)  60 min    Activity Tolerance  No increased pain;Patient tolerated treatment well    Behavior During Therapy  [facility] for tasks assessed/performed       Past Medical History:  Diagnosis Date  . Anancastic neurosis   . Anginal pain ([place])   . Anxiety, generalized   . Bipolar disorder ([place])   . Chronic diarrhea   . Depression   . Hypertension   . Hypothyroidism   . Obsessive compulsive disorder   . OCD (obsessive compulsive disorder)   . Sleep apnea    patient states it was "marginal", no CPAP    Past Surgical History:  Procedure Laterality Date  . COLONOSCOPY WITH PROPOFOL N/A 02/05/2016   Procedure: COLONOSCOPY WITH PROPOFOL;  Surgeon: [name], MD;  Location: [place];  Service: Endoscopy;  Laterality: N/A;  . MOUTH SURGERY     orthodontic surgery for underbite  . MYRINGOTOMY WITH TUBE PLACEMENT    . NASAL SEPTUM SURGERY    . POLYPECTOMY N/A 02/05/2016   Procedure: POLYPECTOMY;  Surgeon: [name], MD;  Location: [place];  Service: Endoscopy;  Laterality: N/A;    There were no vitals filed for this visit.  Subjective Assessment - 07/29/17 1343    Subjective  Pt reports his fecal leakage and sinus issues have gotten worst. Pt has been able to so his PT exercise, very  little bit. Pt has an appt with his ENT doctor next week and with Dr. Allen Norris ( GI referring MD) in March.  Pt is trying to return a call with the Dietitian.  Pt reported  he has not had an updated sleep study. His girlfriend states he snores and noticed sinus drainage while he was sleeping.                      Pelvic Floor Special Questions - 07/29/17 1346    Diastasis Recti  closure of linea alba     External Perineal Exam  through clothing, abdominal straining with cue for pelvic floor contraction during simulated cough . post Tx: demo'd correct technique in supine and sitting posture with coordination of deep core and pelvic floor         [facility] Adult PT Treatment/Exercise - 07/29/17 1347      Neuro Re-ed    Neuro Re-ed Details   see pt instructions, discussion on recommendation on sleep study to address respiratory issues, which in term may minimize his coughing spells to help minimize straining his pelvic floor related to leakage. Provided research on respiratory issues as risks for OSA                   PT Long Term Goals - 06/18/17 0108      PT LONG TERM GOAL #1   Title  Pt will decrease his COREFO score from 22% to <  17% in order improve GI function and return to community events    Time  12    Period  Weeks    Status  On-going      PT LONG TERM GOAL #2   Title  Pt will demo decreased abdominal separation from 3 fingers width along linea alba to < 2 fingers in order to improve intraabdominal pressure for motility, postural stability,  and pelvic floor mobility    Time  6    Period  Weeks    Status  On-going      PT LONG TERM GOAL #3   Title  Pt will demo less R trunk lean and equal stance phase B in gait in order to walk with minimized risk for pelvic floor and spinal deficits     Time  4    Period  Weeks    Status Achieved     PT LONG TERM GOAL #4   Title  Pt will demo no pelvic obliquities across 2 visits in order to progress with deep core strengthening exercises     Time  2    Period  Weeks    Status  Achieved      PT LONG TERM GOAL #5   Title  Pt will demo no puborectalis mm tensions and increased ability  to lengthen pelvic floor mm immediately following contraction in order minimize mm spasms and promote improved bowel function    Time  10    Period  Weeks    Status  New    Target Date  08/27/17            Plan - 07/29/17 1349    Clinical Impression Statement Pt has achieved 2 goals with improved pelvic girdle alignment and resolved diastasis recti which indicate an improved intraabdominal system for better prognosis with incontinence. However, pt demo'd dyscoordination in which he strains his abdominal and pelvic floor mm during simulated coughing. Pt has demonstrated coughing episodes over the phone and during PT sessions. Pt reports that for many years, he has had sinus issues which is associated with his coughing spells and treatments have not helped. Pt feels his sinus and fecal leakage Sx are getting worse and he feels very frustrated.  Pt demo'd proper coordination technique with coughing after training and was explained the importance of continuing with his PT HEP to yield greater musculoskeletal outcomes. Pt has also started to include more fiber as he was provided with general recommendations and referral to a nutritionist for overall GI health.   One barrier to his prognosis is his sinus issues. PT noticed OSA was part of his medical Hx. Pt has the multiple risk factors associated with OSA. His girlfriend has informed pt of his snoring. Pt also notices sinus drainage while sleeping. In combination with his Hx of HPN, nasal septum surgery, and his sinus issues, pt demonstrates increased risks for OSA.  Discussed w/ pt PT plans to communicate with his MD on a referral for an updated sleep study to address his respiratory issues, which in turn, may minimize his coughing spells to further minimize straining his pelvic floor related to leakage.  Pt reports he has had sleep studies in the past and was not provided a conclusive Dx nor f/u with whether he needed a CPAP.  PT has phoned his ENT  doctor: Dr.McQueen with this suggestion as pt is due for an appointment end of the month.   Research relevant to upper airways and OSA.  Levin Bacon al. Annals of Allergy, Asthma & Immunology  2015.   115:2 (96-102)    Pt continues to benefit form skilled PT.     Rehab Potential  Good    PT Frequency  1x / week    PT Duration  12 weeks    PT Treatment/Interventions  Gait training;Neuromuscular re-education;Functional mobility training;Electrical Stimulation;Therapeutic activities;Stair training;Patient/family education;Therapeutic exercise;Moist Heat;Energy conservation    Consulted and Agree with Plan of Care  Patient       Patient will benefit from skilled therapeutic intervention in order to improve the following deficits and impairments:  Hypomobility, Decreased endurance, Pain, Decreased strength, Difficulty walking, Decreased mobility, Decreased balance, Decreased range of motion, Decreased coordination, Postural dysfunction, Improper body mechanics  Visit Diagnosis: Other lack of coordination  Diastasis recti  Sacrococcygeal disorders, not elsewhere classified     Problem List Patient Active Problem List   Diagnosis Date Noted  . OCD (obsessive compulsive disorder) 01/01/2017  . Severe recurrent major depression without psychotic features ([place]) 11/10/2015  . HLD (hyperlipidemia) 11/10/2014  . HTN (hypertension) 11/10/2014  . Adult hypothyroidism 11/10/2014  . 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/10/2014    Jerl Mina ,PT, DPT, E-RYT  07/29/2017, 1:54 PM  [place] MAIN [facility] SERVICES [address] [place], [place], [address] Phone: [phone]   Fax:  [phone]  Name: William Coleman. MRN: [id number] Date of Birth: [birth year]

## 2017-08-04 ENCOUNTER — Ambulatory Visit: Payer: BLUE CROSS/BLUE SHIELD | Admitting: Physical Therapy

## 2017-08-04 DIAGNOSIS — R278 Other lack of coordination: Secondary | ICD-10-CM

## 2017-08-04 DIAGNOSIS — M6208 Separation of muscle (nontraumatic), other site: Secondary | ICD-10-CM

## 2017-08-04 DIAGNOSIS — M533 Sacrococcygeal disorders, not elsewhere classified: Secondary | ICD-10-CM

## 2017-08-06 ENCOUNTER — Other Ambulatory Visit: Payer: Self-pay | Admitting: Unknown Physician Specialty

## 2017-08-06 DIAGNOSIS — J329 Chronic sinusitis, unspecified: Secondary | ICD-10-CM

## 2017-08-06 DIAGNOSIS — G4733 Obstructive sleep apnea (adult) (pediatric): Secondary | ICD-10-CM | POA: Diagnosis not present

## 2017-08-06 DIAGNOSIS — R0982 Postnasal drip: Secondary | ICD-10-CM | POA: Diagnosis not present

## 2017-08-06 DIAGNOSIS — R05 Cough: Secondary | ICD-10-CM | POA: Diagnosis not present

## 2017-08-07 NOTE — Therapy (Addendum)
[place] MAIN [facility] SERVICES [address] [place], [place], [address] Phone: [phone]   Fax:  [phone]  Physical Therapy Discharge Summary   Patient Details  Name: William Coleman. MRN: [id number] Date of Birth: 05-15-1961 Referring Provider: Allen Norris    Encounter Date: 08/04/2017    Past Medical History:  Diagnosis Date  . Anancastic neurosis   . Anginal pain ([place])   . Anxiety, generalized   . Bipolar disorder ([place])   . Chronic diarrhea   . Depression   . Hypertension   . Hypothyroidism   . Obsessive compulsive disorder   . OCD (obsessive compulsive disorder)   . Sleep apnea    patient states it was "marginal", no CPAP    Past Surgical History:  Procedure Laterality Date  . COLONOSCOPY WITH PROPOFOL N/A 02/05/2016   Procedure: COLONOSCOPY WITH PROPOFOL;  Surgeon: [name], MD;  Location: [place];  Service: Endoscopy;  Laterality: N/A;  . MOUTH SURGERY     orthodontic surgery for underbite  . MYRINGOTOMY WITH TUBE PLACEMENT    . NASAL SEPTUM SURGERY    . POLYPECTOMY N/A 02/05/2016   Procedure: POLYPECTOMY;  Surgeon: [name], MD;  Location: [place];  Service: Endoscopy;  Laterality: N/A;     Pt has completed 5 sessions of PT.  Pt reports his fecal leakage Sx have not improved. Pt expresses frustration and is concerned about how his Sx impact his ability to work.  Pt has achieved 5/6 goals with musculoskeletal improvements.  Pt has been educated on the proper coordination of pelvic floor mm with exertional activities such as coughing sneezing, and against gravity motions to minimize straining of pelvic floor mm. Pt showed resolved diastasis recti with complete abdominal mm closure which indicates a better intra-abdominal pressure system for support of organs and muscles and motility.  Pt has also demo'd decreased pelvic floor tightness, restored alignment of pelvic girdle, and improved gait  without R trunk lean. Pt has also been educated about improving GI health with increased fiber and water intake and was provided information for nutritional consult.   One possible barrier to his prognosis is his sinus issues which leads to excessive coughing and clear of his throat and in turn, places load/strain on his abdominal and pelvic floor mm. Pt has also been educated on the importance of undergoing sleep study to screen for OSA given his increased risk factors ( snoring, Hx of nasal septum surgery, and chronic sinus issues). PT has communicated with his ENT MD Dr. Tami Ribas re: recommendation for sleep study. PT educated pt on the importance of minimizing strain on his pelvic floor mm by coordinating his deep core mm during coughing/ clearing of his sinuses. Pt demo'd correctly. Pt has reported his sinus issues also have not gotten any better.  PT suspects if his sinus issues are associated with OSA, and both issues get treated appropriately, then pt may experience less loading on his pelvic floor with less coughing when attempting to clear his sinus issues.    Pt has received motivational interviewing and has been provided encouragement and explanation of physiology and anatomy related to his issues. Pt has been educated that his referring provider will be made aware of how he has tried PT and still is having no changes to his fecal incontinence.    Pt is being d/c at this time.  PT Long Term Goals - 07/29/17 1353      PT LONG TERM GOAL #1   Title  Pt will decrease his COREFO score from 22% to < 17% in order improve GI function and return to community events    Time  12    Period  Weeks    Status  On-going      PT LONG TERM GOAL #2   Title  Pt will demo decreased abdominal separation from 3 fingers width along linea alba to < 2 fingers in order to improve intraabdominal pressure for motility, postural stability,  and pelvic  floor mobility    Time  6    Period  Weeks    Status  Achieved      PT LONG TERM GOAL #3   Title  Pt will demo less R trunk lean and equal stance phase B in gait in order to walk with minimized risk for pelvic floor and spinal deficits     Time  4    Period  Weeks    Status  Achieved     PT LONG TERM GOAL #4   Title  Pt will demo no pelvic obliquities across 2 visits in order to progress with deep core strengthening exercises     Time  2    Period  Weeks    Status  Achieved      PT LONG TERM GOAL #5   Title  Pt will demo no puborectalis mm tensions and increased ability to lengthen pelvic floor mm immediately following contraction in order minimize mm spasms and promote improved bowel function    Time  10    Period  Weeks    Status Achieved     Additional Long Term Goals   Additional Long Term Goals  Yes      PT LONG TERM GOAL #6   Title  Pt will demo coordination of pelvic floor and TrA contraction with simulated cough IND across 3 coughs in order to minimzie straining/ weakening abdominal and pelvic floor mm to improve fecal leakage    Time  8    Period  Weeks    Status  Achieved   Target Date  09/23/17              Patient will benefit from skilled therapeutic intervention in order to improve the following deficits and impairments:     Visit Diagnosis: Diastasis recti  Other lack of coordination  Sacrococcygeal disorders, not elsewhere classified     Problem List Patient Active Problem List   Diagnosis Date Noted  . OCD (obsessive compulsive disorder) 01/01/2017  . Severe recurrent major depression without psychotic features ([place]) 11/10/2015  . HLD (hyperlipidemia) 11/10/2014  . HTN (hypertension) 11/10/2014  . Adult hypothyroidism 11/10/2014  . 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/10/2014    Jerl Mina ,PT, DPT, E-RYT  08/07/2017, 9:09 AM  [place] MAIN [facility] SERVICES [address] [place], [place], [address] Phone:  [phone]   Fax:  [phone]  Name: William Coleman. MRN: [id number] Date of Birth: 12-21-1960

## 2017-08-13 DIAGNOSIS — R51 Headache: Secondary | ICD-10-CM | POA: Diagnosis not present

## 2017-08-15 ENCOUNTER — Ambulatory Visit: Payer: BLUE CROSS/BLUE SHIELD

## 2017-08-18 ENCOUNTER — Ambulatory Visit: Payer: BLUE CROSS/BLUE SHIELD | Admitting: Physical Therapy

## 2017-08-25 ENCOUNTER — Ambulatory Visit (INDEPENDENT_AMBULATORY_CARE_PROVIDER_SITE_OTHER): Payer: BLUE CROSS/BLUE SHIELD | Admitting: Gastroenterology

## 2017-08-25 ENCOUNTER — Encounter: Payer: Self-pay | Admitting: Gastroenterology

## 2017-08-25 VITALS — BP 128/85 | HR 80 | Ht 67.0 in | Wt 148.0 lb

## 2017-08-25 DIAGNOSIS — R150 Incomplete defecation: Secondary | ICD-10-CM | POA: Diagnosis not present

## 2017-08-25 NOTE — Progress Notes (Signed)
Primary Care Physician: Jerrol Banana., MD  Primary Gastroenterologist:  Dr. Lucilla Coleman  No chief complaint on file.   HPI: William Zeman. is a 57 y.o. male here to need incontinence and a history of pelvic floor dysfunction.  The patient was sent for physical therapy of his pelvic floor but reports that he continues to have incontinence.  The patient also was identified by physical therapy to have a contributing factor of his incontinence to his sinus drainage and continuous coughing in the morning.  Patient had a colonoscopy in 2017 with a hyperplastic polyp found at that time but no other cause for his symptoms seen.  The patient continues to have symptoms despite being through physical therapy and taking Lomotil up to 6 pills a day and Vibrozi  Current Outpatient Medications  Medication Sig Dispense Refill  . amLODipine (NORVASC) 5 MG tablet Take 1 tablet (5 mg total) by mouth daily. 90 tablet 3  . aspirin 81 MG tablet Take 81 mg by mouth daily.    . Azelastine-Fluticasone (DYMISTA) 137-50 MCG/ACT SUSP Place 2 sprays into the nose daily. (Patient not taking: Reported on 05/26/2017) 1 Bottle 10  . diphenoxylate-atropine (LOMOTIL) 2.5-0.025 MG tablet Take 1 tablet by mouth 4 (four) times daily as needed for diarrhea or loose stools. 90 tablet 5  . fexofenadine (ALLEGRA ALLERGY) 180 MG tablet Take 1 tablet (180 mg total) by mouth daily. 30 tablet 5  . levothyroxine (SYNTHROID, LEVOTHROID) 25 MCG tablet Take 1 tablet (25 mcg total) by mouth daily. 90 tablet 3  . Multiple Vitamin (MULTIVITAMIN WITH MINERALS) TABS tablet Take 1 tablet by mouth daily.    . rosuvastatin (CRESTOR) 10 MG tablet Take 1 tablet (10 mg total) by mouth daily. 90 tablet 3  . VIAGRA 50 MG tablet Take 50 mg by mouth as needed for erectile dysfunction.   1  . VIBERZI 100 MG TABS Take 100 mg by mouth 2 (two) times daily.   4   No current facility-administered medications for this visit.      Allergies as of 08/25/2017 - Review Complete 05/09/2017  Allergen Reaction Noted  . Amoxicillin Hives 11/10/2014  . Penicillins  11/10/2014  . Sulfa antibiotics  11/10/2014    ROS:  General: Negative for anorexia, weight loss, fever, chills, fatigue, weakness. ENT: Negative for hoarseness, difficulty swallowing , nasal congestion. CV: Negative for chest pain, angina, palpitations, dyspnea on exertion, peripheral edema.  Respiratory: Negative for dyspnea at rest, dyspnea on exertion, cough, sputum, wheezing.  GI: See history of present illness. GU:  Negative for dysuria, hematuria, urinary incontinence, urinary frequency, nocturnal urination.  Endo: Negative for unusual weight change.    Physical Examination:   There were no vitals taken for this visit.  General: Well-nourished, well-developed in no acute distress.  Eyes: No icterus. Conjunctivae pink. Extremities: No lower extremity edema. No clubbing or deformities. Neuro: Alert and oriented x 3.  Grossly intact. Skin: Warm and dry, no jaundice.   Psych: Alert and cooperative, normal mood and affect.  Labs:    Imaging Studies: No results found.  Assessment and Plan:   William Erker. is a 57 y.o. y/o male incontinence and a history of pelvic floor dysfunction.  The patient has completed physical therapy and continues to have incontinence.  The patient has been recommended to be sent to a tertiary care facility such as [facility] or [place].  The patient will be sent to [facility] for further evaluation and  treatment.   William Lame, MD. [name]   Note: This dictation was prepared with Dragon dictation along with smaller phrase technology. Any transcriptional errors that result from this process are unintentional.

## 2017-08-26 ENCOUNTER — Other Ambulatory Visit: Payer: Self-pay

## 2017-08-26 ENCOUNTER — Telehealth: Payer: Self-pay | Admitting: Family Medicine

## 2017-08-26 NOTE — Telephone Encounter (Signed)
Patient states that he saw on the news last night that aspirin 81 MG tablet it is not recommended for patients that do not have cardiovascular disease.  He is wanting to know if you still want him to take this medication. He states that you can leave a message if he does not answer.

## 2017-08-26 NOTE — Telephone Encounter (Signed)
Patient advised he can discontinue aspirin.

## 2017-09-03 ENCOUNTER — Telehealth: Payer: Self-pay | Admitting: Gastroenterology

## 2017-09-03 NOTE — Telephone Encounter (Signed)
Pt is calling to check on [facility] referral he states he is getting worse and he has up his  Dosages and is taking modium md

## 2017-09-08 ENCOUNTER — Telehealth: Payer: Self-pay | Admitting: Gastroenterology

## 2017-09-08 NOTE — Telephone Encounter (Signed)
Patient would like to talk to you before you leave today. He was wondering you Dr. Allen Norris could adjust his medication since his stymptoms are worse. I explained to him you would have to wait until Dr. Allen Norris gets back with you but he just wants you to call him.

## 2017-09-08 NOTE — Telephone Encounter (Signed)
Pt notified he is currently taking max dosage of Viberzi daily. Advised pt he can do Lomotil 1 - 2 tablets four times daily but cannot exceed this dosage. Advised pt to try and not to take this much if at all possible as he is at an increased risk of severe constipation. Pt verbalized understanding.  He stated he is taking:   Viberzi 100mg  BID  Lomotil 2.5mg -0.025mg  - 6 tablets daily   Imodium OTC- sometimes 2 daily   I have expressed to him this may not be a fecal incontinence issue. Most likely this is due to his pelvic floor dysfunction.

## 2017-09-15 ENCOUNTER — Other Ambulatory Visit: Payer: Self-pay

## 2017-09-15 ENCOUNTER — Telehealth: Payer: Self-pay

## 2017-09-15 MED ORDER — DIPHENOXYLATE-ATROPINE 2.5-0.025 MG PO TABS: 2.0000 | ORAL_TABLET | Freq: Four times a day (QID) | ORAL | 5 refills | Status: AC | PRN

## 2017-09-15 NOTE — Telephone Encounter (Signed)
Pt would like to increase his Lomotil to taking 2 tablets 4 times daily. Please advise if this is okay.  He is also currently taking Viberzi 100mg  BID.

## 2017-09-15 NOTE — Telephone Encounter (Signed)
That's ok.

## 2017-09-16 IMAGING — DX DG CHEST 2V
2 series · 2 of 2 positions shown · non-contrast
Comparison: 04/17/2016

CLINICAL DATA: Cough for 3 months

EXAM:
CHEST  2 VIEW

[chest pa]
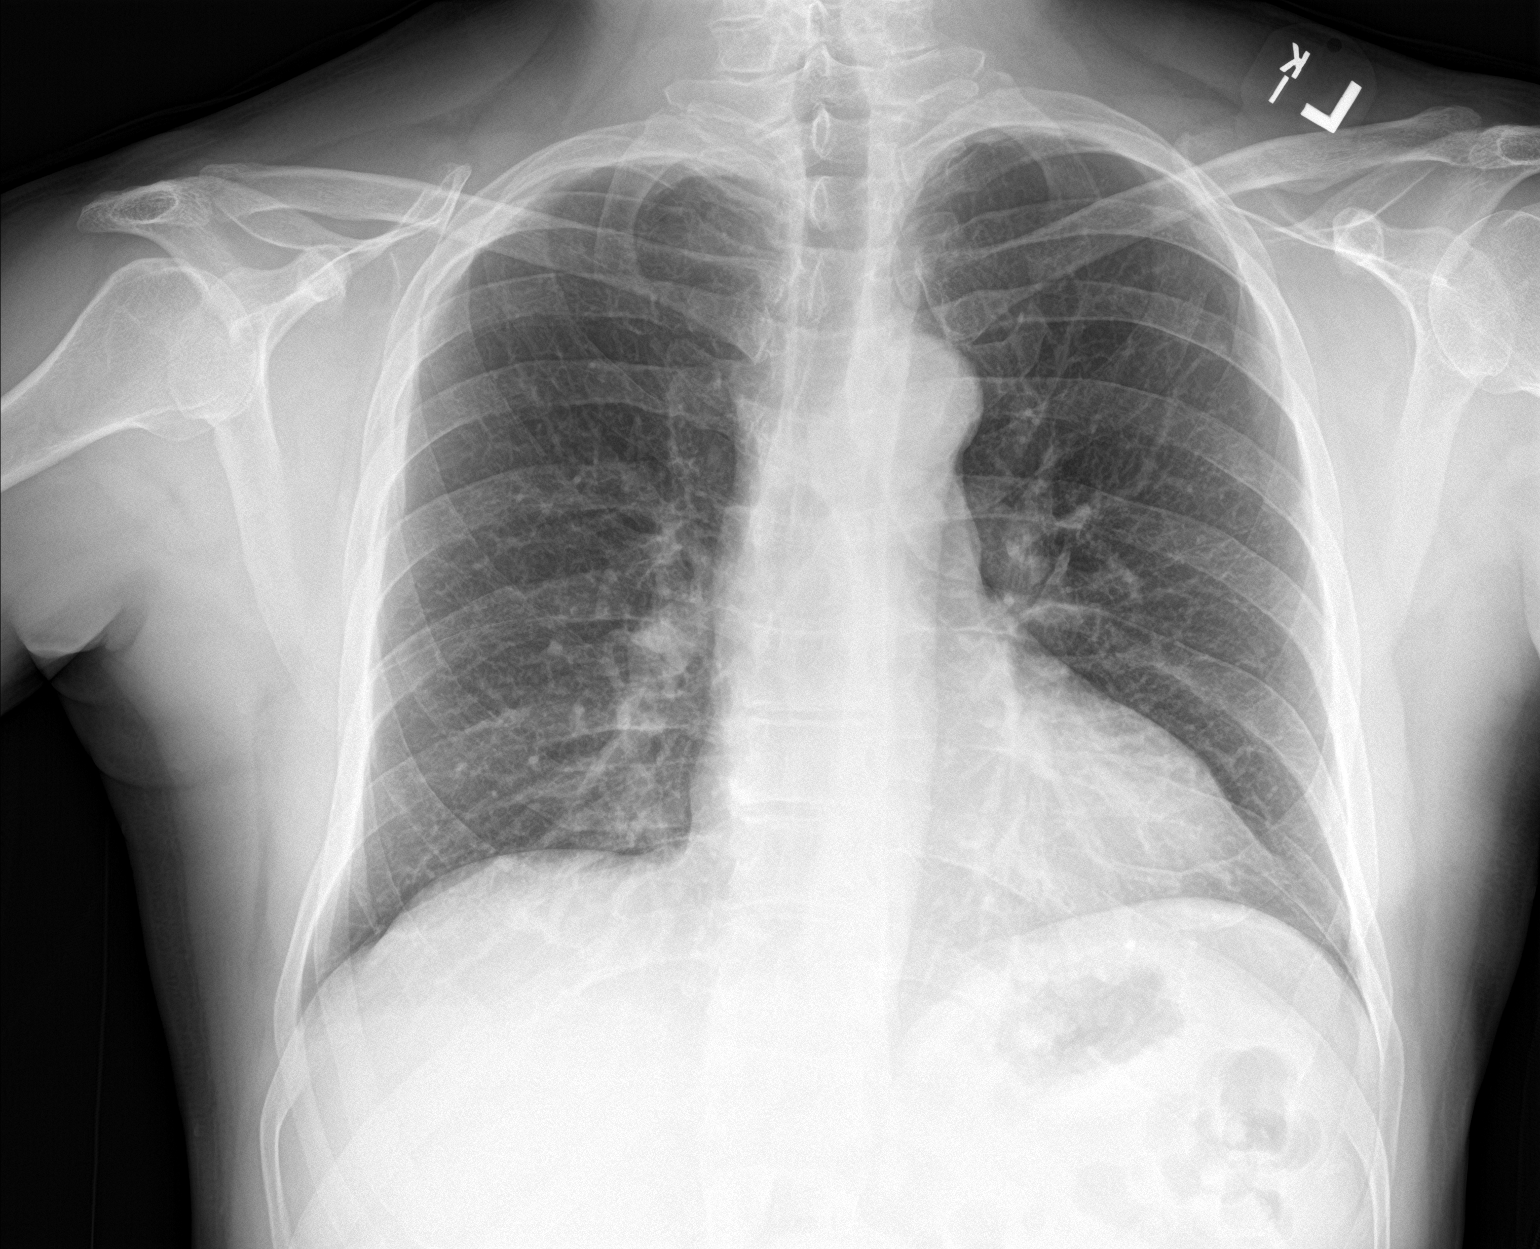

[chest lat]
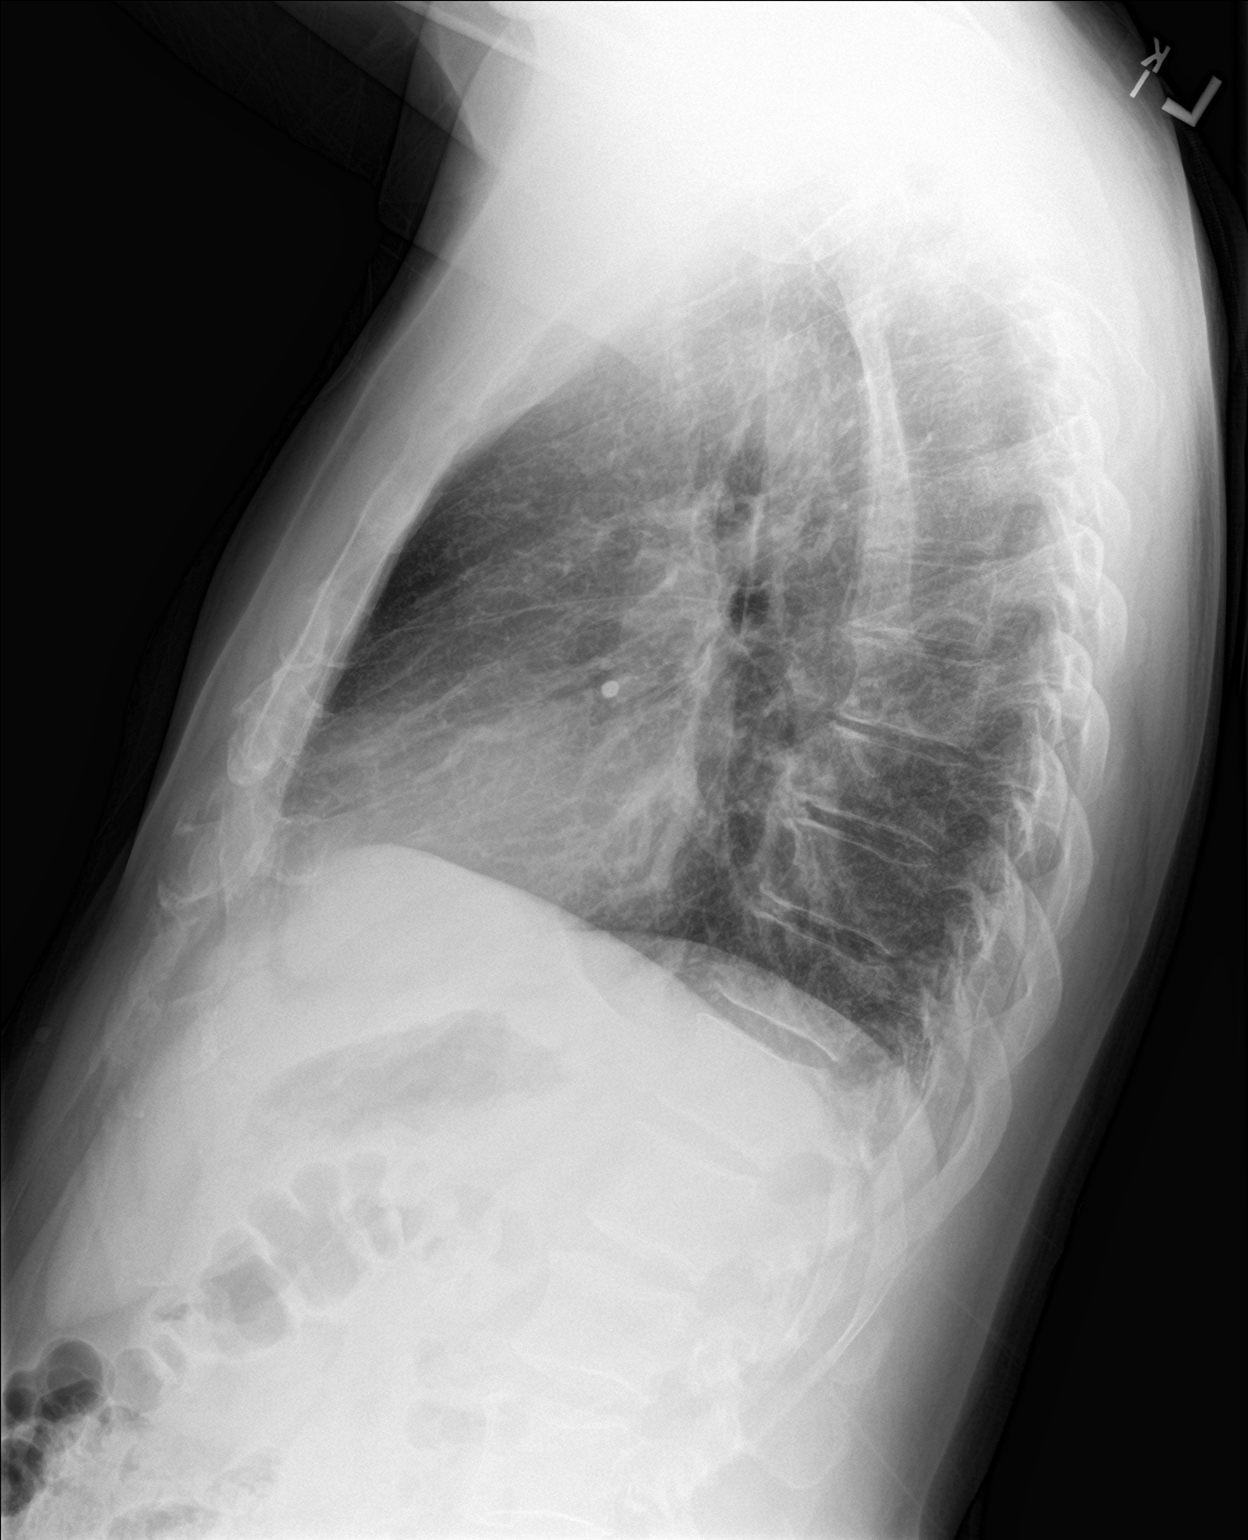

[2 of 2 positions shown; findings below may reference images not displayed]

FINDINGS: The heart size and mediastinal contours are within normal limits.
Both lungs are clear. The visualized skeletal structures are
unremarkable.
IMPRESSION: No active cardiopulmonary disease.

## 2017-09-16 NOTE — Telephone Encounter (Signed)
Prescription refill called into [facility] per pt request. Left vm letting him know this was done.

## 2017-09-17 ENCOUNTER — Telehealth: Payer: Self-pay | Admitting: Gastroenterology

## 2017-09-17 NOTE — Telephone Encounter (Signed)
Please call patient regarding medication  

## 2017-09-17 NOTE — Telephone Encounter (Signed)
LVM for pt to return my call.

## 2017-09-17 NOTE — Telephone Encounter (Signed)
Pt was calling to make sure I had sent his rx for Lomotil to the pharmacy.

## 2017-09-22 ENCOUNTER — Telehealth: Payer: Self-pay | Admitting: Gastroenterology

## 2017-09-22 NOTE — Telephone Encounter (Signed)
Pt would like a call from Ginger his rx is not working and his symtoms are remaining the same

## 2017-09-23 NOTE — Telephone Encounter (Signed)
The patient now we have nothing further to offer him as far as any medications and that is why we are sending him to [facility] for evaluation.

## 2017-09-24 NOTE — Telephone Encounter (Signed)
Pt.notified

## 2017-10-09 ENCOUNTER — Telehealth: Payer: Self-pay | Admitting: Gastroenterology

## 2017-10-09 NOTE — Telephone Encounter (Signed)
Pt would like a call from Ginger regarding a question he was touching base with her for a referral from [facility] for rectale specialist the first apt they have is sept 9th he states Ginger put in a referral for [facility] which is even farther out . He was wondering if Ginger had checked with [facility]

## 2017-10-10 NOTE — Telephone Encounter (Signed)
Left vm letting pt know [facility] can possibly schedule him in July. Information refaxed to [phone]. They will contact pt to schedule appt.

## 2017-10-22 ENCOUNTER — Telehealth: Payer: Self-pay | Admitting: Pulmonary Disease

## 2017-10-22 ENCOUNTER — Telehealth: Payer: Self-pay | Admitting: Gastroenterology

## 2017-10-22 NOTE — Telephone Encounter (Signed)
Left detailed message that office spirometry and PFT were performed only. If any further question please contact office.

## 2017-10-22 NOTE — Telephone Encounter (Signed)
Pt was requesting appt dates for paperwork he is filling out for [facility]. Was requesting colonoscopy date and date of Anorectal manometry done at [facility]. No further questions.

## 2017-10-22 NOTE — Telephone Encounter (Signed)
PT WOULD LIKE A CALL FROM GINGER HE HAS APT WITH [facility] AND THEY  WANT HIM TO FILL OUT PAPER WORK PRIOR   TO APT  HE HAD SOME TEST DONE IN NOV OR October  HE NEEDS TO KNOW SOME DATES  CB [phone] PLEASE CALL

## 2017-10-22 NOTE — Telephone Encounter (Signed)
Pt calling stating he is having rectal procedure at [facility]. He is filling out paperwork for that, and on it there are some questions about some tests.  He is calling to see since we are his Pulmonary providers to see if he's done these test And would like a call back  He needs to know if he has done  1.esophageal minophoty test 2.hydrogen breath test 3.esophageal ph study  He is asking stating he did do some tests with [place] last year but he is not sure if what is being asked is the same as what we ordered on him  Please call back

## 2017-10-29 DIAGNOSIS — R0982 Postnasal drip: Secondary | ICD-10-CM | POA: Diagnosis not present

## 2017-11-10 DIAGNOSIS — K219 Gastro-esophageal reflux disease without esophagitis: Secondary | ICD-10-CM | POA: Diagnosis not present

## 2017-11-10 DIAGNOSIS — R159 Full incontinence of feces: Secondary | ICD-10-CM | POA: Diagnosis not present

## 2017-11-10 DIAGNOSIS — K594 Anal spasm: Secondary | ICD-10-CM | POA: Diagnosis not present

## 2017-12-02 DIAGNOSIS — M79672 Pain in left foot: Secondary | ICD-10-CM | POA: Diagnosis not present

## 2017-12-02 DIAGNOSIS — M109 Gout, unspecified: Secondary | ICD-10-CM | POA: Diagnosis not present

## 2017-12-08 ENCOUNTER — Other Ambulatory Visit: Payer: Self-pay | Admitting: Gastroenterology

## 2017-12-08 DIAGNOSIS — M10072 Idiopathic gout, left ankle and foot: Secondary | ICD-10-CM | POA: Diagnosis not present

## 2017-12-09 DIAGNOSIS — R140 Abdominal distension (gaseous): Secondary | ICD-10-CM | POA: Diagnosis not present

## 2017-12-09 DIAGNOSIS — R197 Diarrhea, unspecified: Secondary | ICD-10-CM | POA: Diagnosis not present

## 2017-12-09 DIAGNOSIS — Z8669 Personal history of other diseases of the nervous system and sense organs: Secondary | ICD-10-CM | POA: Diagnosis not present

## 2017-12-09 DIAGNOSIS — K6389 Other specified diseases of intestine: Secondary | ICD-10-CM | POA: Diagnosis not present

## 2017-12-23 ENCOUNTER — Other Ambulatory Visit: Payer: Self-pay | Admitting: Gastroenterology

## 2017-12-29 DIAGNOSIS — K580 Irritable bowel syndrome with diarrhea: Secondary | ICD-10-CM | POA: Diagnosis not present

## 2017-12-29 DIAGNOSIS — K6289 Other specified diseases of anus and rectum: Secondary | ICD-10-CM | POA: Diagnosis not present

## 2017-12-29 DIAGNOSIS — R159 Full incontinence of feces: Secondary | ICD-10-CM | POA: Diagnosis not present

## 2017-12-29 DIAGNOSIS — K594 Anal spasm: Secondary | ICD-10-CM | POA: Diagnosis not present

## 2017-12-30 ENCOUNTER — Ambulatory Visit: Payer: Self-pay | Admitting: Family Medicine

## 2017-12-30 ENCOUNTER — Other Ambulatory Visit: Payer: Self-pay | Admitting: Gastroenterology

## 2017-12-31 ENCOUNTER — Other Ambulatory Visit: Payer: Self-pay

## 2017-12-31 MED ORDER — ELUXADOLINE 100 MG PO TABS: 100.0000 mg | ORAL_TABLET | Freq: Two times a day (BID) | ORAL | 5 refills | Status: DC

## 2017-12-31 MED ORDER — DIPHENOXYLATE-ATROPINE 2.5-0.025 MG PO TABS: 2.0000 | ORAL_TABLET | Freq: Four times a day (QID) | ORAL | 5 refills | Status: DC | PRN

## 2017-12-31 NOTE — Telephone Encounter (Signed)
Pt is calling to get refill on rx vibersy 100 mg 2 times a day send to [facility] in [place]

## 2018-01-01 DIAGNOSIS — J342 Deviated nasal septum: Secondary | ICD-10-CM | POA: Diagnosis not present

## 2018-01-01 DIAGNOSIS — J3089 Other allergic rhinitis: Secondary | ICD-10-CM | POA: Insufficient documentation

## 2018-01-01 DIAGNOSIS — K219 Gastro-esophageal reflux disease without esophagitis: Secondary | ICD-10-CM | POA: Diagnosis not present

## 2018-01-01 DIAGNOSIS — J309 Allergic rhinitis, unspecified: Secondary | ICD-10-CM | POA: Diagnosis not present

## 2018-01-01 DIAGNOSIS — J328 Other chronic sinusitis: Secondary | ICD-10-CM | POA: Diagnosis not present

## 2018-01-01 DIAGNOSIS — R0982 Postnasal drip: Secondary | ICD-10-CM | POA: Diagnosis not present

## 2018-01-01 DIAGNOSIS — R05 Cough: Secondary | ICD-10-CM | POA: Diagnosis not present

## 2018-01-01 DIAGNOSIS — J329 Chronic sinusitis, unspecified: Secondary | ICD-10-CM | POA: Diagnosis not present

## 2018-01-01 HISTORY — DX: Gastro-esophageal reflux disease without esophagitis: K21.9

## 2018-01-21 DIAGNOSIS — K209 Esophagitis, unspecified: Secondary | ICD-10-CM | POA: Diagnosis not present

## 2018-01-21 DIAGNOSIS — K219 Gastro-esophageal reflux disease without esophagitis: Secondary | ICD-10-CM | POA: Diagnosis not present

## 2018-01-21 DIAGNOSIS — K920 Hematemesis: Secondary | ICD-10-CM | POA: Diagnosis not present

## 2018-01-21 DIAGNOSIS — K2270 Barrett's esophagus without dysplasia: Secondary | ICD-10-CM | POA: Diagnosis not present

## 2018-01-21 DIAGNOSIS — K3189 Other diseases of stomach and duodenum: Secondary | ICD-10-CM | POA: Diagnosis not present

## 2018-01-21 DIAGNOSIS — K228 Other specified diseases of esophagus: Secondary | ICD-10-CM | POA: Diagnosis not present

## 2018-01-21 DIAGNOSIS — K449 Diaphragmatic hernia without obstruction or gangrene: Secondary | ICD-10-CM | POA: Diagnosis not present

## 2018-01-27 DIAGNOSIS — F331 Major depressive disorder, recurrent, moderate: Secondary | ICD-10-CM | POA: Diagnosis not present

## 2018-01-29 DIAGNOSIS — J328 Other chronic sinusitis: Secondary | ICD-10-CM | POA: Diagnosis not present

## 2018-01-29 DIAGNOSIS — Z880 Allergy status to penicillin: Secondary | ICD-10-CM | POA: Diagnosis not present

## 2018-01-29 DIAGNOSIS — J3089 Other allergic rhinitis: Secondary | ICD-10-CM | POA: Diagnosis not present

## 2018-01-29 DIAGNOSIS — K219 Gastro-esophageal reflux disease without esophagitis: Secondary | ICD-10-CM | POA: Diagnosis not present

## 2018-01-29 DIAGNOSIS — Z882 Allergy status to sulfonamides status: Secondary | ICD-10-CM | POA: Diagnosis not present

## 2018-01-29 DIAGNOSIS — Z79899 Other long term (current) drug therapy: Secondary | ICD-10-CM | POA: Diagnosis not present

## 2018-01-29 DIAGNOSIS — Z7951 Long term (current) use of inhaled steroids: Secondary | ICD-10-CM | POA: Diagnosis not present

## 2018-01-29 DIAGNOSIS — J342 Deviated nasal septum: Secondary | ICD-10-CM | POA: Diagnosis not present

## 2018-01-29 DIAGNOSIS — R0982 Postnasal drip: Secondary | ICD-10-CM | POA: Diagnosis not present

## 2018-01-29 DIAGNOSIS — J329 Chronic sinusitis, unspecified: Secondary | ICD-10-CM | POA: Diagnosis not present

## 2018-02-03 DIAGNOSIS — R159 Full incontinence of feces: Secondary | ICD-10-CM | POA: Diagnosis not present

## 2018-02-24 ENCOUNTER — Telehealth: Payer: Self-pay | Admitting: Gastroenterology

## 2018-02-24 NOTE — Telephone Encounter (Signed)
Pt left vm he had been referred to [facility]  The Doctor he has been seen  Has not been helping him much he would like a referral to [facility] he had an apt with them but cancelled it. He would like an another referral to [facility] for 2nd opinion

## 2018-02-24 NOTE — Telephone Encounter (Signed)
Referral has been sent to [facility] for 2nd opinion.

## 2018-03-05 DIAGNOSIS — R0982 Postnasal drip: Secondary | ICD-10-CM | POA: Diagnosis not present

## 2018-03-05 DIAGNOSIS — J342 Deviated nasal septum: Secondary | ICD-10-CM | POA: Diagnosis not present

## 2018-03-05 DIAGNOSIS — K219 Gastro-esophageal reflux disease without esophagitis: Secondary | ICD-10-CM | POA: Diagnosis not present

## 2018-03-05 DIAGNOSIS — Z880 Allergy status to penicillin: Secondary | ICD-10-CM | POA: Diagnosis not present

## 2018-03-05 DIAGNOSIS — J329 Chronic sinusitis, unspecified: Secondary | ICD-10-CM | POA: Diagnosis not present

## 2018-03-05 DIAGNOSIS — J320 Chronic maxillary sinusitis: Secondary | ICD-10-CM | POA: Diagnosis not present

## 2018-03-05 DIAGNOSIS — J309 Allergic rhinitis, unspecified: Secondary | ICD-10-CM | POA: Diagnosis not present

## 2018-03-18 DIAGNOSIS — R05 Cough: Secondary | ICD-10-CM | POA: Diagnosis not present

## 2018-03-18 DIAGNOSIS — R6889 Other general symptoms and signs: Secondary | ICD-10-CM | POA: Diagnosis not present

## 2018-03-18 DIAGNOSIS — K449 Diaphragmatic hernia without obstruction or gangrene: Secondary | ICD-10-CM | POA: Diagnosis not present

## 2018-03-18 DIAGNOSIS — K2270 Barrett's esophagus without dysplasia: Secondary | ICD-10-CM | POA: Diagnosis not present

## 2018-03-18 DIAGNOSIS — R490 Dysphonia: Secondary | ICD-10-CM | POA: Diagnosis not present

## 2018-03-18 DIAGNOSIS — J384 Edema of larynx: Secondary | ICD-10-CM | POA: Diagnosis not present

## 2018-03-18 DIAGNOSIS — K219 Gastro-esophageal reflux disease without esophagitis: Secondary | ICD-10-CM | POA: Diagnosis not present

## 2018-03-18 DIAGNOSIS — Z79899 Other long term (current) drug therapy: Secondary | ICD-10-CM | POA: Diagnosis not present

## 2018-03-18 DIAGNOSIS — J387 Other diseases of larynx: Secondary | ICD-10-CM | POA: Diagnosis not present

## 2018-03-25 DIAGNOSIS — K6289 Other specified diseases of anus and rectum: Secondary | ICD-10-CM | POA: Diagnosis not present

## 2018-03-25 DIAGNOSIS — M6289 Other specified disorders of muscle: Secondary | ICD-10-CM | POA: Diagnosis not present

## 2018-03-25 DIAGNOSIS — G894 Chronic pain syndrome: Secondary | ICD-10-CM | POA: Diagnosis not present

## 2018-03-25 DIAGNOSIS — M62838 Other muscle spasm: Secondary | ICD-10-CM | POA: Diagnosis not present

## 2018-03-26 ENCOUNTER — Other Ambulatory Visit: Payer: Self-pay | Admitting: Family Medicine

## 2018-03-26 DIAGNOSIS — I10 Essential (primary) hypertension: Secondary | ICD-10-CM

## 2018-03-26 DIAGNOSIS — E782 Mixed hyperlipidemia: Secondary | ICD-10-CM

## 2018-03-26 MED ORDER — ROSUVASTATIN CALCIUM 10 MG PO TABS: 10.0000 mg | ORAL_TABLET | Freq: Every day | ORAL | 3 refills | Status: DC

## 2018-03-26 MED ORDER — AMLODIPINE BESYLATE 5 MG PO TABS: 5.0000 mg | ORAL_TABLET | Freq: Every day | ORAL | 3 refills | Status: DC

## 2018-03-26 NOTE — Telephone Encounter (Signed)
[employer] faxed refill request for the following medications:  rosuvastatin (CRESTOR) 10 MG tablet  Qty: 90 Last filled: 12/26/2017  amLODipine (NORVASC) 5 MG tablet Qty: 90  Last filled: 12/26/2017  Please advise.

## 2018-04-16 DIAGNOSIS — F451 Undifferentiated somatoform disorder: Secondary | ICD-10-CM | POA: Diagnosis not present

## 2018-04-16 DIAGNOSIS — F332 Major depressive disorder, recurrent severe without psychotic features: Secondary | ICD-10-CM | POA: Diagnosis not present

## 2018-04-21 DIAGNOSIS — R159 Full incontinence of feces: Secondary | ICD-10-CM | POA: Diagnosis not present

## 2018-04-22 DIAGNOSIS — K594 Anal spasm: Secondary | ICD-10-CM | POA: Diagnosis not present

## 2018-04-29 DIAGNOSIS — R05 Cough: Secondary | ICD-10-CM | POA: Diagnosis not present

## 2018-04-29 DIAGNOSIS — J387 Other diseases of larynx: Secondary | ICD-10-CM | POA: Diagnosis not present

## 2018-04-29 DIAGNOSIS — R6889 Other general symptoms and signs: Secondary | ICD-10-CM | POA: Diagnosis not present

## 2018-05-14 DIAGNOSIS — K594 Anal spasm: Secondary | ICD-10-CM | POA: Diagnosis not present

## 2018-05-14 DIAGNOSIS — K591 Functional diarrhea: Secondary | ICD-10-CM | POA: Diagnosis not present

## 2018-05-15 ENCOUNTER — Ambulatory Visit: Payer: BLUE CROSS/BLUE SHIELD | Attending: Pain Medicine | Admitting: Physical Therapy

## 2018-05-15 DIAGNOSIS — K6289 Other specified diseases of anus and rectum: Secondary | ICD-10-CM | POA: Insufficient documentation

## 2018-05-15 DIAGNOSIS — R278 Other lack of coordination: Secondary | ICD-10-CM | POA: Insufficient documentation

## 2018-05-15 NOTE — Therapy (Signed)
[place] MAIN [facility] SERVICES [address] [place], [place], [address] Phone: [phone]   Fax:  [phone]  Patient Details  Name: William Coleman. MRN: [id number] Date of Birth: [birth year] Referring Provider:  Jodi Geralds,*  Encounter Date: 05/15/2018   Pt arrived for evaluation for rectal pain but declined PT at this time due to financial restraints as he is paying off his medial bills.  Pt reported he is still having rectal spasms. Pt was recommended by GI doctor to try pelvic PT. Nothing has changed since he tried pelvic PT. Pt has undergone multiple tests. "There were no rips or tears in muscles". Pt has tried PNE implant but it did not work and "it made the spasms worse when he went up one notch" on the machine.   Pt was educated and encouraged to continue with previously prescribed pelvic PT exercises to relax pelvic floor mm. Pt voiced understanding.  Encouraged pt that pt appeared with improvements with upright posture. No assessment was performed and no charges where billed due to pt declining PT today.     Jerl Mina ,PT, DPT, E-RYT  05/15/2018, 4:37 PM  [place] MAIN [facility] SERVICES [address] [place], [place], [address] Phone: [phone]   Fax:  [phone]

## 2018-05-22 ENCOUNTER — Encounter: Payer: BLUE CROSS/BLUE SHIELD | Admitting: Physical Therapy

## 2018-05-28 DIAGNOSIS — F332 Major depressive disorder, recurrent severe without psychotic features: Secondary | ICD-10-CM | POA: Diagnosis not present

## 2018-05-28 DIAGNOSIS — F451 Undifferentiated somatoform disorder: Secondary | ICD-10-CM | POA: Diagnosis not present

## 2018-05-29 ENCOUNTER — Encounter: Payer: BLUE CROSS/BLUE SHIELD | Admitting: Physical Therapy

## 2018-05-29 DIAGNOSIS — J310 Chronic rhinitis: Secondary | ICD-10-CM | POA: Diagnosis not present

## 2018-05-29 DIAGNOSIS — R0982 Postnasal drip: Secondary | ICD-10-CM | POA: Diagnosis not present

## 2018-06-08 ENCOUNTER — Encounter: Payer: Self-pay | Admitting: Physical Therapy

## 2018-06-11 ENCOUNTER — Encounter: Payer: Self-pay | Admitting: Family Medicine

## 2018-06-11 ENCOUNTER — Ambulatory Visit (INDEPENDENT_AMBULATORY_CARE_PROVIDER_SITE_OTHER): Payer: BLUE CROSS/BLUE SHIELD | Admitting: Family Medicine

## 2018-06-11 VITALS — BP 132/84 | HR 74 | Temp 98.4°F | Resp 16 | Wt 140.4 lb

## 2018-06-11 DIAGNOSIS — R634 Abnormal weight loss: Secondary | ICD-10-CM

## 2018-06-11 DIAGNOSIS — K589 Irritable bowel syndrome without diarrhea: Secondary | ICD-10-CM | POA: Diagnosis not present

## 2018-06-11 DIAGNOSIS — K580 Irritable bowel syndrome with diarrhea: Secondary | ICD-10-CM | POA: Diagnosis not present

## 2018-06-11 DIAGNOSIS — F429 Obsessive-compulsive disorder, unspecified: Secondary | ICD-10-CM

## 2018-06-11 DIAGNOSIS — F332 Major depressive disorder, recurrent severe without psychotic features: Secondary | ICD-10-CM

## 2018-06-11 DIAGNOSIS — E782 Mixed hyperlipidemia: Secondary | ICD-10-CM | POA: Diagnosis not present

## 2018-06-11 MED ORDER — CHOLESTYRAMINE LIGHT 4 G PO PACK: PACK | ORAL | 0 refills | Status: DC

## 2018-06-11 NOTE — Progress Notes (Deleted)
       Patient: William Coleman. Male    DOB: 03-27-1961   58 y.o.   MRN: [id number] Visit Date: 06/11/2018  Today's Provider: Wilhemena Durie, MD   No chief complaint on file.  Subjective:     HPI  Allergies  Allergen Reactions  . Amoxicillin Hives  . Penicillins   . Sulfa Antibiotics      Current Outpatient Medications:  .  amLODipine (NORVASC) 5 MG tablet, Take 1 tablet (5 mg total) by mouth daily., Disp: 90 tablet, Rfl: 3 .  Azelastine-Fluticasone (DYMISTA) 137-50 MCG/ACT SUSP, Place 2 sprays into the nose daily. (Patient not taking: Reported on 05/26/2017), Disp: 1 Bottle, Rfl: 10 .  diphenoxylate-atropine (LOMOTIL) 2.5-0.025 MG tablet, Take 2 tablets by mouth 4 (four) times daily as needed for diarrhea or loose stools., Disp: 240 tablet, Rfl: 5 .  Eluxadoline (VIBERZI) 100 MG TABS, Take 1 tablet (100 mg total) by mouth 2 (two) times daily., Disp: 60 tablet, Rfl: 5 .  fexofenadine (ALLEGRA ALLERGY) 180 MG tablet, Take 1 tablet (180 mg total) by mouth daily. (Patient not taking: Reported on 08/25/2017), Disp: 30 tablet, Rfl: 5 .  levothyroxine (SYNTHROID, LEVOTHROID) 25 MCG tablet, Take 1 tablet (25 mcg total) by mouth daily., Disp: 90 tablet, Rfl: 3 .  Multiple Vitamin (MULTIVITAMIN WITH MINERALS) TABS tablet, Take 1 tablet by mouth daily., Disp: , Rfl:  .  rosuvastatin (CRESTOR) 10 MG tablet, Take 1 tablet (10 mg total) by mouth daily., Disp: 90 tablet, Rfl: 3 .  VIAGRA 50 MG tablet, Take 50 mg by mouth as needed for erectile dysfunction. , Disp: , Rfl: 1  Review of Systems  Social History   Tobacco Use  . Smoking status: Never Smoker  . Smokeless tobacco: Never Used  Substance Use Topics  . Alcohol use: Yes    Comment: rarely      Objective:   There were no vitals taken for this visit. There were no vitals filed for this visit.   Physical Exam      Assessment & Plan        Wilhemena Durie, MD  [facility]

## 2018-06-11 NOTE — Progress Notes (Signed)
Patient: William Coleman. Male    DOB: 11-Aug-1960   58 y.o.   MRN: [id number] Visit Date: 06/11/2018  Today's Provider: Wilhemena Durie, MD   Chief Complaint  Patient presents with  . Weight Loss   Subjective:     HPI Patient comes in office today to address weight loss over the past six months. Patient states that he has had no changes in his diet or daily activity. Patient reports that on average he weighs 150lbs or more and has dropped 10lbs. Patient reports that he does has a history of IBS but does not believe this is cause of unusual weight loss.     Allergies  Allergen Reactions  . Penicillins Hives and Rash  . Sulfa Antibiotics Hives  . Amoxicillin Hives     Current Outpatient Medications:  .  amLODipine (NORVASC) 5 MG tablet, Take 1 tablet (5 mg total) by mouth daily., Disp: 90 tablet, Rfl: 3 .  Ascorbic Acid (VITAMIN C) 100 MG tablet, Take 100 mg by mouth daily., Disp: , Rfl:  .  diphenoxylate-atropine (LOMOTIL) 2.5-0.025 MG tablet, Take 2 tablets by mouth 4 (four) times daily as needed for diarrhea or loose stools., Disp: 240 tablet, Rfl: 5 .  Eluxadoline (VIBERZI) 100 MG TABS, Take 1 tablet (100 mg total) by mouth 2 (two) times daily., Disp: 60 tablet, Rfl: 5 .  hyoscyamine (ANASPAZ) 0.125 MG TBDP disintergrating tablet, Place 0.125 mg under the tongue every 4 (four) hours as needed., Disp: , Rfl:  .  loperamide (IMODIUM) 1 MG/5ML solution, Take by mouth as needed for diarrhea or loose stools., Disp: , Rfl:  .  Multiple Vitamin (MULTIVITAMIN WITH MINERALS) TABS tablet, Take 1 tablet by mouth daily., Disp: , Rfl:  .  omeprazole (PRILOSEC) 20 MG capsule, Take 20 mg by mouth daily., Disp: , Rfl:  .  rosuvastatin (CRESTOR) 10 MG tablet, Take 1 tablet (10 mg total) by mouth daily., Disp: 90 tablet, Rfl: 3 .  simethicone (MYLICON) 80 MG chewable tablet, Chew 80 mg by mouth every 6 (six) hours as needed for flatulence., Disp: , Rfl:  .  VIAGRA 50 MG  tablet, Take 50 mg by mouth as needed for erectile dysfunction. , Disp: , Rfl: 1 .  Vit B6-Vit B12-Omega 3 Acids (VITAMIN B PLUS+ PO), Take by mouth., Disp: , Rfl:  .  Azelastine-Fluticasone (DYMISTA) 137-50 MCG/ACT SUSP, Place 2 sprays into the nose daily. (Patient not taking: Reported on 05/26/2017), Disp: 1 Bottle, Rfl: 10 .  fexofenadine (ALLEGRA ALLERGY) 180 MG tablet, Take 1 tablet (180 mg total) by mouth daily. (Patient not taking: Reported on 08/25/2017), Disp: 30 tablet, Rfl: 5 .  levothyroxine (SYNTHROID, LEVOTHROID) 25 MCG tablet, Take 1 tablet (25 mcg total) by mouth daily., Disp: 90 tablet, Rfl: 3  Review of Systems  Constitutional: Negative.   HENT: Positive for rhinorrhea and sinus pain.   Eyes: Negative.   Respiratory: Positive for cough.   Gastrointestinal: Positive for diarrhea. Negative for abdominal distention, abdominal pain, anal bleeding, blood in stool, constipation, nausea, rectal pain and vomiting.  Endocrine: Negative.   Genitourinary: Negative.   Skin: Negative.   Neurological: Negative.   Psychiatric/Behavioral:       He denies any psychiatric issues.  He thinks that his psychiatric issues are due to the physical complaints.    Social History   Tobacco Use  . Smoking status: Never Smoker  . Smokeless tobacco: Never Used  Substance Use Topics  .  Alcohol use: Yes    Comment: rarely      Objective:   BP 132/84   Pulse 74   Temp 98.4 F (36.9 C) (Oral)   Resp 16   Wt 140 lb 6.4 oz (63.7 kg)   SpO2 100%   BMI 21.99 kg/m  Vitals:   06/11/18 0845  BP: 132/84  Pulse: 74  Resp: 16  Temp: 98.4 F (36.9 C)  TempSrc: Oral  SpO2: 100%  Weight: 140 lb 6.4 oz (63.7 kg)     Physical Exam Constitutional:      Appearance: He is well-developed.  HENT:     Head: Normocephalic and atraumatic.     Right Ear: External ear normal.     Left Ear: External ear normal.     Nose: Nose normal.  Eyes:     General: No scleral icterus.    Conjunctiva/sclera:  Conjunctivae normal.  Neck:     Thyroid: No thyromegaly.  Cardiovascular:     Rate and Rhythm: Normal rate and regular rhythm.     Heart sounds: Normal heart sounds.  Pulmonary:     Effort: Pulmonary effort is normal.     Breath sounds: Normal breath sounds.  Abdominal:     Palpations: Abdomen is soft.  Musculoskeletal: Normal range of motion.  Lymphadenopathy:     Cervical: No cervical adenopathy.  Skin:    General: Skin is warm and dry.  Neurological:     Mental Status: He is alert and oriented to person, place, and time.  Psychiatric:        Behavior: Behavior normal.        Thought Content: Thought content normal.        Judgment: Judgment normal.         Assessment & Plan    1. Weight loss He has lost approximately 9 pounds in the past year.  This is all due to his psychiatric issues.  Obtain lab data.  Recheck in 1 to 2 months.  If he continues to lose weight will begin work-up for this.  He has seen GI and ENT extensively for his complaints for today.  More than 35 minutes is spent in counseling and coordination of care today. - CBC with Differential/Platelet - Comprehensive metabolic panel - TSH  2. Irritable bowel syndrome with diarrhea He is getting physical therapy and sees GI regularly.  He complains of "messy" stool.  3. Mixed hyperlipidemia  - Lipid panel  4. Adaptive colitis   5. Severe recurrent major depression without psychotic features [facility]) Per psychiatry.  He is not suicidal or homicidal.  6. Obsessive-compulsive disorder, unspecified type 7.  Runny nose He is actually having some sort of procedure next week at [facility] for this.     Dezhane Staten Cranford Mon, MD  [facility]

## 2018-06-12 LAB — LIPID PANEL
Chol/HDL Ratio: 2.5 ratio (ref 0.0–5.0)
Cholesterol, Total: 154 mg/dL (ref 100–199)
HDL: 62 mg/dL (ref >39)
LDL Calculated: 75 mg/dL (ref 0–99)
Triglycerides: 84 mg/dL (ref 0–149)
VLDL Cholesterol Cal: 17 mg/dL (ref 5–40)

## 2018-06-12 LAB — COMPREHENSIVE METABOLIC PANEL
ALT: 23 IU/L (ref 0–44)
AST: 26 IU/L (ref 0–40)
Albumin/Globulin Ratio: 2.4 — ABNORMAL HIGH (ref 1.2–2.2)
Albumin: 5 g/dL (ref 3.5–5.5)
Alkaline Phosphatase: 68 IU/L (ref 39–117)
BUN/Creatinine Ratio: 22 — ABNORMAL HIGH (ref 9–20)
BUN: 21 mg/dL (ref 6–24)
Bilirubin Total: 0.5 mg/dL (ref 0.0–1.2)
CO2: 26 mmol/L (ref 20–29)
Calcium: 9.9 mg/dL (ref 8.7–10.2)
Chloride: 99 mmol/L (ref 96–106)
Creatinine, Ser: 0.96 mg/dL (ref 0.76–1.27)
GFR calc Af Amer: 102 mL/min/{1.73_m2} (ref >59)
GFR calc non Af Amer: 88 mL/min/{1.73_m2} (ref >59)
Globulin, Total: 2.1 g/dL (ref 1.5–4.5)
Glucose: 105 mg/dL — ABNORMAL HIGH (ref 65–99)
Potassium: 3.9 mmol/L (ref 3.5–5.2)
Sodium: 141 mmol/L (ref 134–144)
Total Protein: 7.1 g/dL (ref 6.0–8.5)

## 2018-06-12 LAB — TSH: TSH: 1.02 u[IU]/mL (ref 0.450–4.500)

## 2018-06-12 LAB — CBC WITH DIFFERENTIAL/PLATELET
BASOS: 1 %
Basophils Absolute: 0.1 10*3/uL (ref 0.0–0.2)
EOS (ABSOLUTE): 0.4 10*3/uL (ref 0.0–0.4)
Eos: 4 %
Hematocrit: 44.1 % (ref 37.5–51.0)
Hemoglobin: 15.1 g/dL (ref 13.0–17.7)
Immature Grans (Abs): 0 10*3/uL (ref 0.0–0.1)
Immature Granulocytes: 0 %
LYMPHS ABS: 1.3 10*3/uL (ref 0.7–3.1)
Lymphs: 15 %
MCH: 30.7 pg (ref 26.6–33.0)
MCHC: 34.2 g/dL (ref 31.5–35.7)
MCV: 90 fL (ref 79–97)
Monocytes Absolute: 0.7 10*3/uL (ref 0.1–0.9)
Monocytes: 8 %
NEUTROS ABS: 6.2 10*3/uL (ref 1.4–7.0)
Neutrophils: 72 %
Platelets: 297 10*3/uL (ref 150–450)
RBC: 4.92 x10E6/uL (ref 4.14–5.80)
RDW: 12.2 % — ABNORMAL LOW (ref 12.3–15.4)
WBC: 8.7 10*3/uL (ref 3.4–10.8)

## 2018-06-15 ENCOUNTER — Telehealth: Payer: Self-pay

## 2018-06-15 NOTE — Telephone Encounter (Signed)
Patient is calling office requesting that we call him back with recent lab results. KW

## 2018-06-15 NOTE — Telephone Encounter (Signed)
Please sign off on his labs and I will call him Thanks

## 2018-06-16 ENCOUNTER — Telehealth: Payer: Self-pay

## 2018-06-16 NOTE — Telephone Encounter (Signed)
-----   Message from Jerrol Banana., MD sent at 06/15/2018  3:01 PM EST ----- Labs WNL.

## 2018-06-16 NOTE — Telephone Encounter (Signed)
LVMTRC 

## 2018-06-17 DIAGNOSIS — J310 Chronic rhinitis: Secondary | ICD-10-CM | POA: Diagnosis not present

## 2018-06-17 DIAGNOSIS — R0982 Postnasal drip: Secondary | ICD-10-CM | POA: Diagnosis not present

## 2018-06-17 NOTE — Telephone Encounter (Signed)
Advised patient of results.  

## 2018-06-18 ENCOUNTER — Ambulatory Visit: Payer: Self-pay | Admitting: Family Medicine

## 2018-06-19 ENCOUNTER — Encounter: Payer: Self-pay | Admitting: Physical Therapy

## 2018-06-26 ENCOUNTER — Encounter: Payer: Self-pay | Admitting: Physical Therapy

## 2018-07-03 ENCOUNTER — Encounter: Payer: Self-pay | Admitting: Physical Therapy

## 2018-07-04 DIAGNOSIS — S0093XA Contusion of unspecified part of head, initial encounter: Secondary | ICD-10-CM | POA: Diagnosis not present

## 2018-07-08 DIAGNOSIS — F331 Major depressive disorder, recurrent, moderate: Secondary | ICD-10-CM | POA: Diagnosis not present

## 2018-07-09 ENCOUNTER — Telehealth: Payer: Self-pay | Admitting: Gastroenterology

## 2018-07-09 NOTE — Telephone Encounter (Signed)
Patient would like to get a second opinion for his GI issues. Dr Allen Norris  Referred him to [facility] because he could be seen sooner than [facility].  Patient has seen Dr Bary Castilla at [facility] and doesn't seem to get any results. He would like for [place] to refer him out . He has [place] through [facility]

## 2018-07-09 NOTE — Telephone Encounter (Signed)
His GI care was transferred to [place]. Either [facility] or his PCP will need to do this referral. Please let him know. Thank you!

## 2018-07-10 ENCOUNTER — Telehealth: Payer: Self-pay | Admitting: Gastroenterology

## 2018-07-10 NOTE — Telephone Encounter (Signed)
Patient called back & is aware he needs to contact his primary care for the referral .

## 2018-07-10 NOTE — Telephone Encounter (Signed)
I called patient & l/m that he would need to obtain a referral from [facility] or his PCP. We released his care & aren't able to help with the referral.I left our number if he has any question's

## 2018-07-10 NOTE — Telephone Encounter (Signed)
Patient called back &  is aware he needs to  Contact his PCP for the referral.

## 2018-07-14 DIAGNOSIS — D2272 Melanocytic nevi of left lower limb, including hip: Secondary | ICD-10-CM | POA: Diagnosis not present

## 2018-07-14 DIAGNOSIS — D225 Melanocytic nevi of trunk: Secondary | ICD-10-CM | POA: Diagnosis not present

## 2018-07-14 DIAGNOSIS — D2261 Melanocytic nevi of right upper limb, including shoulder: Secondary | ICD-10-CM | POA: Diagnosis not present

## 2018-07-14 DIAGNOSIS — D485 Neoplasm of uncertain behavior of skin: Secondary | ICD-10-CM | POA: Diagnosis not present

## 2018-07-14 DIAGNOSIS — D2262 Melanocytic nevi of left upper limb, including shoulder: Secondary | ICD-10-CM | POA: Diagnosis not present

## 2018-07-23 ENCOUNTER — Telehealth: Payer: Self-pay

## 2018-07-23 DIAGNOSIS — Z1211 Encounter for screening for malignant neoplasm of colon: Secondary | ICD-10-CM

## 2018-07-23 NOTE — Telephone Encounter (Signed)
Please review. Thanks!  

## 2018-07-23 NOTE — Telephone Encounter (Signed)
Patient is asking to be referred to [facility] GI department.  He has seen Dr Allen Norris and [facility] GI department but they are not in his insurance network.

## 2018-07-28 ENCOUNTER — Other Ambulatory Visit: Payer: Self-pay | Admitting: Gastroenterology

## 2018-07-28 DIAGNOSIS — F331 Major depressive disorder, recurrent, moderate: Secondary | ICD-10-CM | POA: Diagnosis not present

## 2018-07-28 NOTE — Telephone Encounter (Signed)
See message below for patient's preferences. Thank you!

## 2018-07-28 NOTE — Addendum Note (Signed)
Addended by: Julieta Bellini on: 07/28/2018 11:10 AM   Modules accepted: Orders

## 2018-07-28 NOTE — Telephone Encounter (Signed)
ok 

## 2018-07-29 DIAGNOSIS — J310 Chronic rhinitis: Secondary | ICD-10-CM | POA: Diagnosis not present

## 2018-07-30 ENCOUNTER — Other Ambulatory Visit: Payer: Self-pay

## 2018-07-30 MED ORDER — ELUXADOLINE 100 MG PO TABS: 100.0000 mg | ORAL_TABLET | Freq: Two times a day (BID) | ORAL | 5 refills | Status: DC

## 2018-07-30 MED ORDER — DIPHENOXYLATE-ATROPINE 2.5-0.025 MG PO TABS: 2.0000 | ORAL_TABLET | Freq: Four times a day (QID) | ORAL | 5 refills | Status: AC | PRN

## 2018-08-01 IMAGING — CR DG CHEST 2V
1 series · 2 of 2 positions shown · non-contrast
Comparison: 06/24/2016

CLINICAL DATA: Chronic cough for couple of weeks, nonproductive.

EXAM:
CHEST  2 VIEW

[Series 1: dg chest 2 view · 0.14mm/px · 2 of 2 slices shown]
[im 1/2]
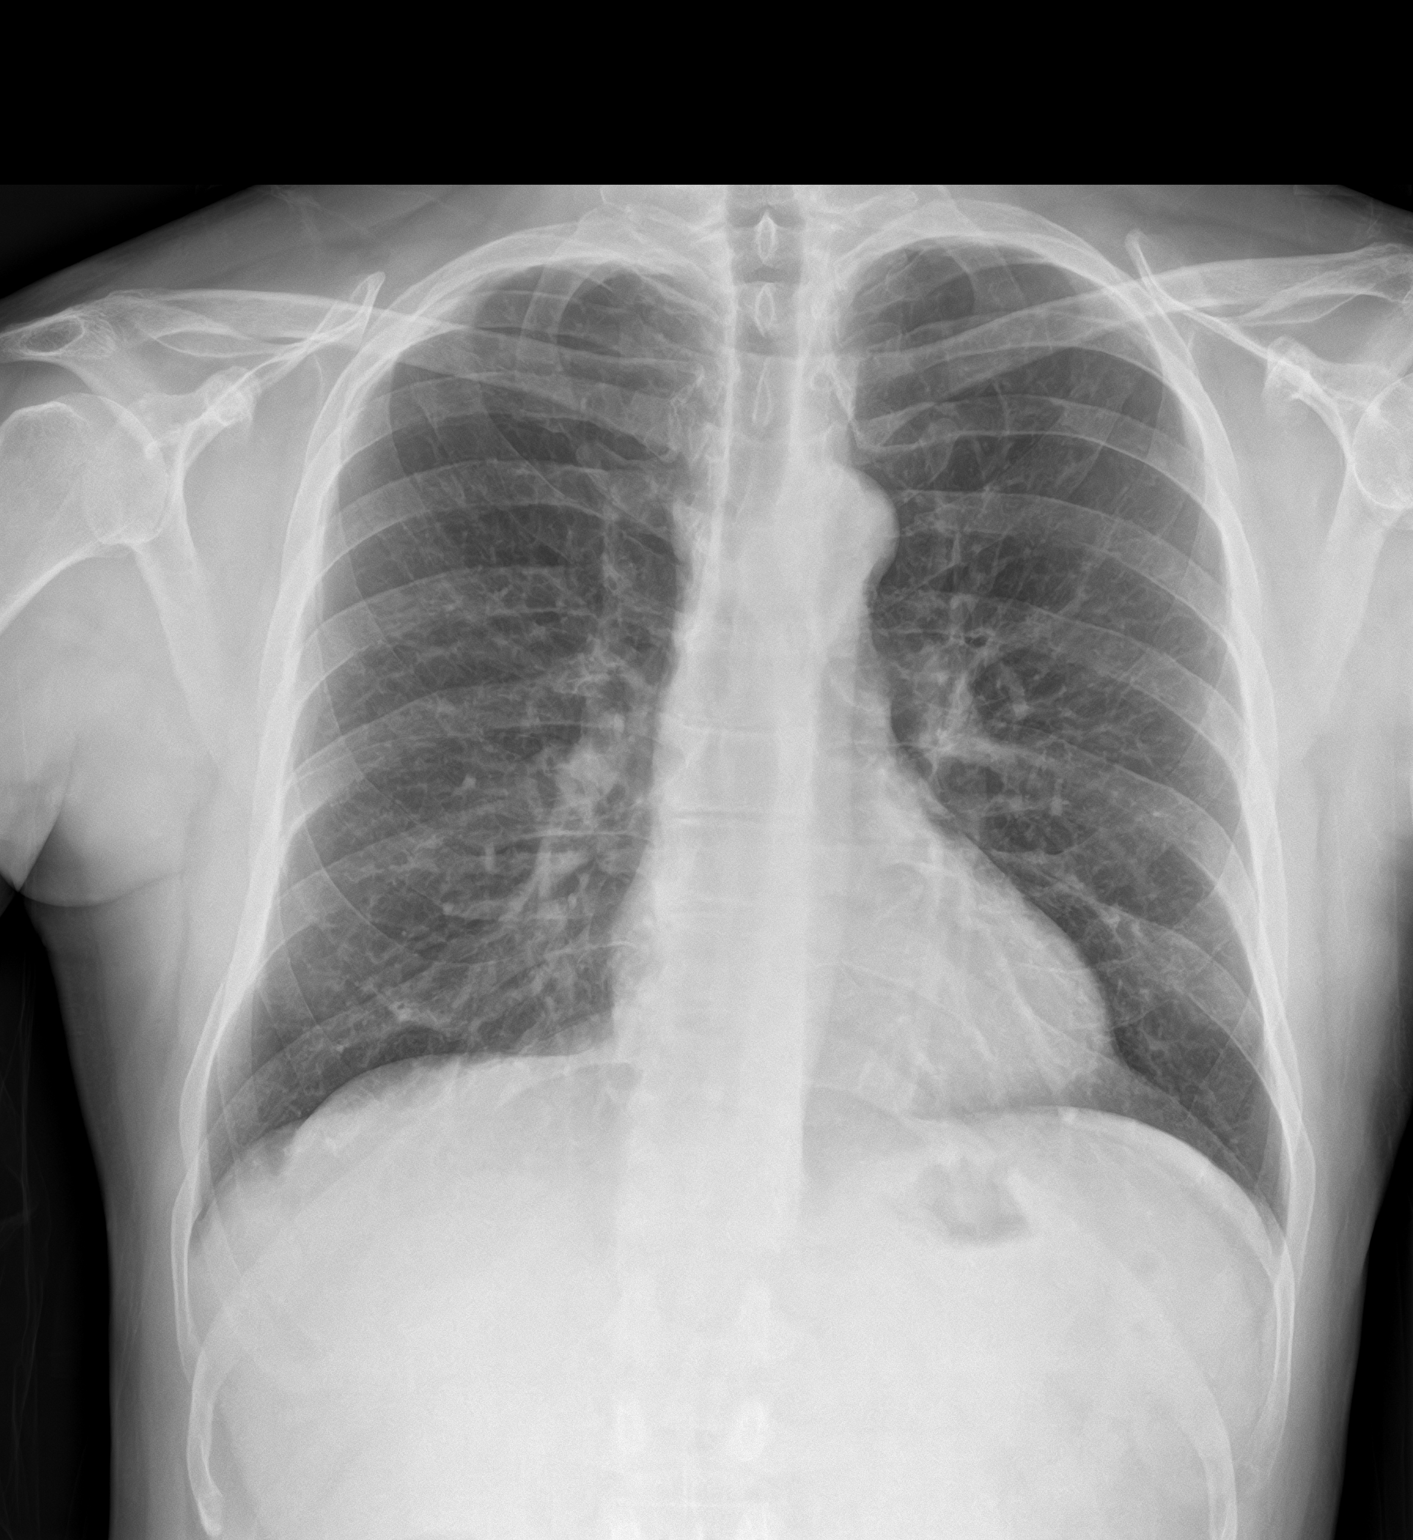
[im 2/2]
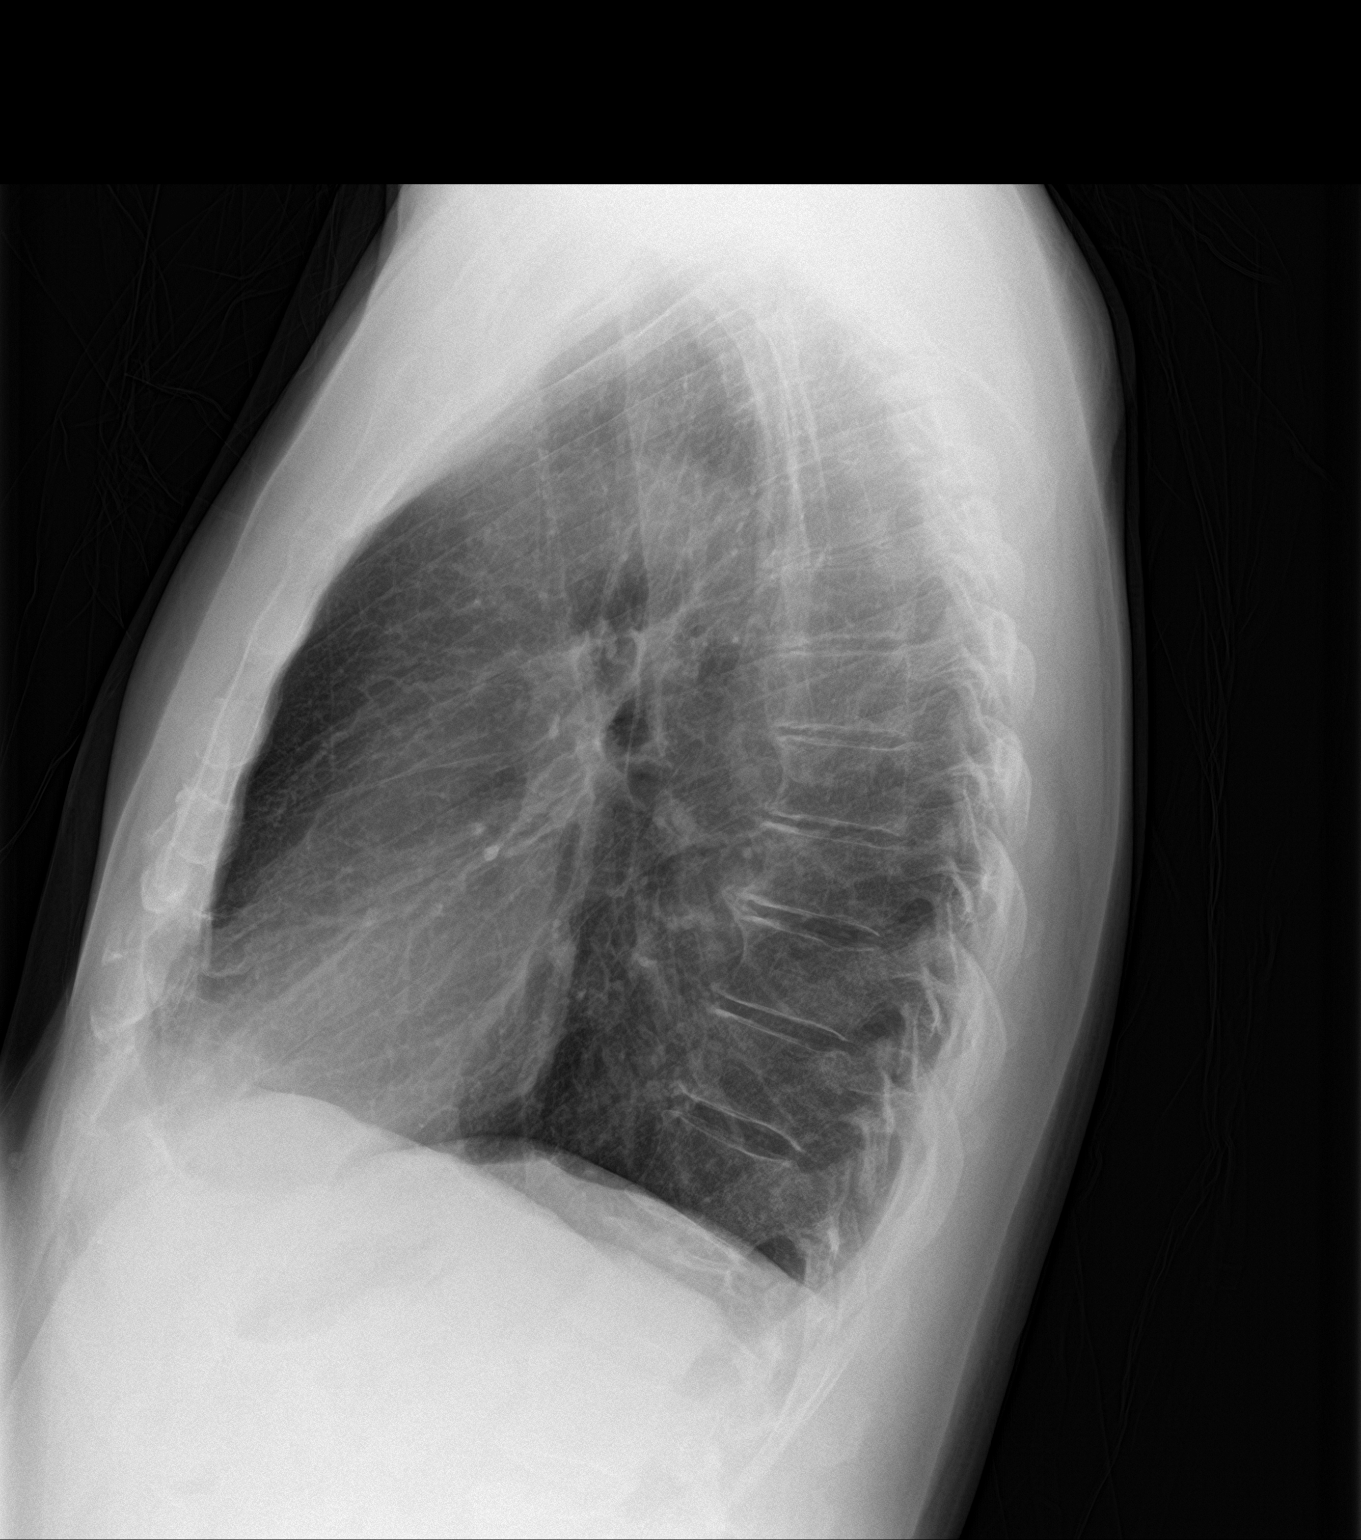

[2 of 2 positions shown; findings below may reference images not displayed]

FINDINGS: The cardiomediastinal silhouette is within normal limits. Slight
interstitial coarsening is unchanged. No confluent airspace opacity,
edema, pleural effusion, pneumothorax is identified. No acute
osseous abnormality is seen.
IMPRESSION: No active cardiopulmonary disease.

## 2018-08-07 ENCOUNTER — Telehealth: Payer: Self-pay | Admitting: Family Medicine

## 2018-08-07 NOTE — Telephone Encounter (Signed)
Pt's called saying his GI doctor Dr. Roselyn Reef is out of his network and they want to know if Dr. Rosanna Randy will take over prescribing his GI meds.  CB# [phone]  Thanks Con Memos

## 2018-08-07 NOTE — Telephone Encounter (Signed)
ok 

## 2018-08-07 NOTE — Telephone Encounter (Signed)
Please review. Thanks!  

## 2018-08-10 ENCOUNTER — Other Ambulatory Visit: Payer: Self-pay

## 2018-08-10 DIAGNOSIS — K529 Noninfective gastroenteritis and colitis, unspecified: Secondary | ICD-10-CM

## 2018-08-10 DIAGNOSIS — R159 Full incontinence of feces: Secondary | ICD-10-CM

## 2018-08-10 NOTE — Telephone Encounter (Signed)
LMTCB ED 

## 2018-08-10 NOTE — Telephone Encounter (Signed)
Patient advised.cbe

## 2018-09-08 DIAGNOSIS — F331 Major depressive disorder, recurrent, moderate: Secondary | ICD-10-CM | POA: Diagnosis not present

## 2018-09-16 DIAGNOSIS — R05 Cough: Secondary | ICD-10-CM | POA: Diagnosis not present

## 2018-09-16 DIAGNOSIS — J309 Allergic rhinitis, unspecified: Secondary | ICD-10-CM | POA: Diagnosis not present

## 2018-09-24 DIAGNOSIS — F331 Major depressive disorder, recurrent, moderate: Secondary | ICD-10-CM | POA: Diagnosis not present

## 2018-09-25 ENCOUNTER — Other Ambulatory Visit: Payer: Self-pay | Admitting: Family Medicine

## 2018-10-01 MED ORDER — HYOSCYAMINE SULFATE 0.125 MG PO TBDP: 0.1250 mg | ORAL_TABLET | ORAL | 1 refills | Status: DC | PRN

## 2018-10-01 NOTE — Telephone Encounter (Signed)
Pt picked up the Hyoscyamine 0.125 but it was only for 10 days   He needs this sent in for longer than 10 days.  He said he usually gets 3 months at a time.  Please advise  [place]  Thanks teri

## 2018-10-08 ENCOUNTER — Other Ambulatory Visit: Payer: Self-pay

## 2018-10-08 NOTE — Telephone Encounter (Signed)
Please review. Thanks!  

## 2018-10-08 NOTE — Addendum Note (Signed)
Addended by: Wilder Glade on: 10/08/2018 04:21 PM   Modules accepted: Orders

## 2018-10-08 NOTE — Telephone Encounter (Signed)
Patient called saying that we only sent in a 10 day supply of the medication. He is requesting that we send in more. Please review. Thanks!

## 2018-10-09 MED ORDER — HYOSCYAMINE SULFATE 0.125 MG PO TBDP: 0.1250 mg | ORAL_TABLET | ORAL | 1 refills | Status: AC | PRN

## 2018-10-13 ENCOUNTER — Telehealth: Payer: Self-pay

## 2018-10-13 DIAGNOSIS — J329 Chronic sinusitis, unspecified: Secondary | ICD-10-CM

## 2018-10-13 DIAGNOSIS — F331 Major depressive disorder, recurrent, moderate: Secondary | ICD-10-CM | POA: Diagnosis not present

## 2018-10-13 DIAGNOSIS — J3089 Other allergic rhinitis: Secondary | ICD-10-CM

## 2018-10-13 NOTE — Telephone Encounter (Signed)
Patient would like a referral to [facility] ENT specialist for chronic cough and chronic sinus drainage. Patient says he has been seen by [facility] and [facility] in the past and they weren't  able to do anything for him. He wasn't satisfied with their evaluation.  He wants a second opinion.

## 2018-10-14 NOTE — Telephone Encounter (Signed)
Order placed

## 2018-10-14 NOTE — Telephone Encounter (Signed)
You can put in an order but this is not a priority in the wake of the coronavirus pandemic.  It is not necessary but the patient will not be able to let it go.

## 2018-10-21 DIAGNOSIS — R197 Diarrhea, unspecified: Secondary | ICD-10-CM | POA: Diagnosis not present

## 2018-10-21 DIAGNOSIS — M6289 Other specified disorders of muscle: Secondary | ICD-10-CM | POA: Diagnosis not present

## 2018-10-27 DIAGNOSIS — Z6822 Body mass index (BMI) 22.0-22.9, adult: Secondary | ICD-10-CM | POA: Diagnosis not present

## 2018-10-27 DIAGNOSIS — J310 Chronic rhinitis: Secondary | ICD-10-CM | POA: Diagnosis not present

## 2018-10-27 DIAGNOSIS — J329 Chronic sinusitis, unspecified: Secondary | ICD-10-CM | POA: Diagnosis not present

## 2018-10-29 DIAGNOSIS — F331 Major depressive disorder, recurrent, moderate: Secondary | ICD-10-CM | POA: Diagnosis not present

## 2018-11-12 DIAGNOSIS — F331 Major depressive disorder, recurrent, moderate: Secondary | ICD-10-CM | POA: Diagnosis not present

## 2018-11-17 ENCOUNTER — Emergency Department
Admission: EM | Admit: 2018-11-17 | Discharge: 2018-11-18 | Disposition: A | Discharge status: Other Acute Inpatient Hospital | Payer: BLUE CROSS/BLUE SHIELD | Attending: Emergency Medicine | Admitting: Emergency Medicine

## 2018-11-17 ENCOUNTER — Other Ambulatory Visit: Payer: Self-pay

## 2018-11-17 ENCOUNTER — Encounter: Payer: Self-pay | Admitting: Emergency Medicine

## 2018-11-17 DIAGNOSIS — Z880 Allergy status to penicillin: Secondary | ICD-10-CM | POA: Diagnosis not present

## 2018-11-17 DIAGNOSIS — I10 Essential (primary) hypertension: Secondary | ICD-10-CM | POA: Diagnosis not present

## 2018-11-17 DIAGNOSIS — Z79899 Other long term (current) drug therapy: Secondary | ICD-10-CM | POA: Insufficient documentation

## 2018-11-17 DIAGNOSIS — F329 Major depressive disorder, single episode, unspecified: Secondary | ICD-10-CM | POA: Diagnosis not present

## 2018-11-17 DIAGNOSIS — E039 Hypothyroidism, unspecified: Secondary | ICD-10-CM | POA: Diagnosis not present

## 2018-11-17 DIAGNOSIS — R45851 Suicidal ideations: Secondary | ICD-10-CM | POA: Diagnosis not present

## 2018-11-17 DIAGNOSIS — Z046 Encounter for general psychiatric examination, requested by authority: Secondary | ICD-10-CM | POA: Diagnosis not present

## 2018-11-17 DIAGNOSIS — E876 Hypokalemia: Secondary | ICD-10-CM | POA: Diagnosis not present

## 2018-11-17 DIAGNOSIS — Z20828 Contact with and (suspected) exposure to other viral communicable diseases: Secondary | ICD-10-CM | POA: Diagnosis not present

## 2018-11-17 DIAGNOSIS — F32A Depression, unspecified: Secondary | ICD-10-CM

## 2018-11-17 LAB — CBC
HCT: 43.8 % (ref 39.0–52.0)
Hemoglobin: 15.7 g/dL (ref 13.0–17.0)
MCH: 30.8 pg (ref 26.0–34.0)
MCHC: 35.8 g/dL (ref 30.0–36.0)
MCV: 86.1 fL (ref 80.0–100.0)
Platelets: 280 10*3/uL (ref 150–400)
RBC: 5.09 MIL/uL (ref 4.22–5.81)
RDW: 12.4 % (ref 11.5–15.5)
WBC: 9.2 10*3/uL (ref 4.0–10.5)
nRBC: 0 % (ref 0.0–0.2)

## 2018-11-17 LAB — ETHANOL: Alcohol, Ethyl (B): <10 mg/dL (ref <10)

## 2018-11-17 LAB — COMPREHENSIVE METABOLIC PANEL
ALT: 22 U/L (ref 0–44)
AST: 28 U/L (ref 15–41)
Albumin: 5 g/dL (ref 3.5–5.0)
Alkaline Phosphatase: 75 U/L (ref 38–126)
Anion gap: 14 (ref 5–15)
BUN: 22 mg/dL — ABNORMAL HIGH (ref 6–20)
CO2: 25 mmol/L (ref 22–32)
Calcium: 9.5 mg/dL (ref 8.9–10.3)
Chloride: 98 mmol/L (ref 98–111)
Creatinine, Ser: 1.17 mg/dL (ref 0.61–1.24)
GFR calc Af Amer: >60 mL/min (ref >60)
GFR calc non Af Amer: >60 mL/min (ref >60)
Glucose, Bld: 81 mg/dL (ref 70–99)
Potassium: 3.1 mmol/L — ABNORMAL LOW (ref 3.5–5.1)
Sodium: 137 mmol/L (ref 135–145)
Total Bilirubin: 1.3 mg/dL — ABNORMAL HIGH (ref 0.3–1.2)
Total Protein: 7.8 g/dL (ref 6.5–8.1)

## 2018-11-17 LAB — ACETAMINOPHEN LEVEL: Acetaminophen (Tylenol), Serum: <10 ug/mL — ABNORMAL LOW (ref 10–30)

## 2018-11-17 LAB — SALICYLATE LEVEL: Salicylate Lvl: <7 mg/dL (ref 2.8–30.0)

## 2018-11-17 NOTE — ED Notes (Signed)
With male officer and this nurse present, pt removed green short-sleeve shirt, jeans, black tennis shoes, black belt, white underwear, set of keys, brown wallet, cell phone & charger, and white socks--all placed in labeled pt belonging bag to be secured on nursing unit and pt changed into paper burgandy scrubs

## 2018-11-17 NOTE — ED Notes (Signed)
Pt. Transferred from Triage to room 23 after dressing out and screening for contraband. Pt. Oriented to [place] including Q15 minute rounds as well as Rover and Officer for their protection. Patient is alert and oriented, warm and dry in no acute distress. Patient denies SI, HI, and AVH. Pt. Encouraged to let me know if needs arise.  

## 2018-11-17 NOTE — ED Notes (Signed)
Hourly rounding reveals patient in room. No complaints, stable, in no acute distress. Q15 minute rounds and monitoring via Rover and Officer to continue.   

## 2018-11-17 NOTE — ED Triage Notes (Signed)
Patient ambulatory to triage with steady gait, without difficulty or distress noted, mask in place, in custody of [place] [employer] for IVC; pt reports here for suicidal thoughts with plan

## 2018-11-18 ENCOUNTER — Other Ambulatory Visit: Payer: Self-pay | Admitting: Behavioral Health

## 2018-11-18 ENCOUNTER — Inpatient Hospital Stay (HOSPITAL_COMMUNITY)
Admission: AD | Admit: 2018-11-18 | Discharge: 2018-11-25 | DRG: 885 | Disposition: A | Discharge status: Home / Self Care | Payer: BLUE CROSS/BLUE SHIELD | Source: Intra-hospital | Attending: Psychiatry | Admitting: Psychiatry

## 2018-11-18 ENCOUNTER — Encounter (HOSPITAL_COMMUNITY): Payer: Self-pay

## 2018-11-18 DIAGNOSIS — R4689 Other symptoms and signs involving appearance and behavior: Secondary | ICD-10-CM | POA: Diagnosis not present

## 2018-11-18 DIAGNOSIS — F332 Major depressive disorder, recurrent severe without psychotic features: Secondary | ICD-10-CM | POA: Diagnosis not present

## 2018-11-18 DIAGNOSIS — F429 Obsessive-compulsive disorder, unspecified: Secondary | ICD-10-CM | POA: Diagnosis not present

## 2018-11-18 DIAGNOSIS — E785 Hyperlipidemia, unspecified: Secondary | ICD-10-CM | POA: Diagnosis present

## 2018-11-18 DIAGNOSIS — F329 Major depressive disorder, single episode, unspecified: Secondary | ICD-10-CM

## 2018-11-18 DIAGNOSIS — I10 Essential (primary) hypertension: Secondary | ICD-10-CM | POA: Diagnosis present

## 2018-11-18 DIAGNOSIS — Z8249 Family history of ischemic heart disease and other diseases of the circulatory system: Secondary | ICD-10-CM | POA: Diagnosis not present

## 2018-11-18 DIAGNOSIS — K580 Irritable bowel syndrome with diarrhea: Secondary | ICD-10-CM | POA: Diagnosis present

## 2018-11-18 DIAGNOSIS — E7800 Pure hypercholesterolemia, unspecified: Secondary | ICD-10-CM | POA: Diagnosis not present

## 2018-11-18 DIAGNOSIS — Z79899 Other long term (current) drug therapy: Secondary | ICD-10-CM | POA: Diagnosis not present

## 2018-11-18 DIAGNOSIS — Z8042 Family history of malignant neoplasm of prostate: Secondary | ICD-10-CM | POA: Diagnosis not present

## 2018-11-18 DIAGNOSIS — G8929 Other chronic pain: Secondary | ICD-10-CM | POA: Diagnosis present

## 2018-11-18 DIAGNOSIS — K219 Gastro-esophageal reflux disease without esophagitis: Secondary | ICD-10-CM | POA: Diagnosis not present

## 2018-11-18 DIAGNOSIS — J329 Chronic sinusitis, unspecified: Secondary | ICD-10-CM | POA: Diagnosis not present

## 2018-11-18 DIAGNOSIS — Z1159 Encounter for screening for other viral diseases: Secondary | ICD-10-CM | POA: Diagnosis not present

## 2018-11-18 DIAGNOSIS — Z880 Allergy status to penicillin: Secondary | ICD-10-CM | POA: Diagnosis not present

## 2018-11-18 DIAGNOSIS — M549 Dorsalgia, unspecified: Secondary | ICD-10-CM | POA: Diagnosis present

## 2018-11-18 DIAGNOSIS — F319 Bipolar disorder, unspecified: Secondary | ICD-10-CM | POA: Diagnosis not present

## 2018-11-18 DIAGNOSIS — Z046 Encounter for general psychiatric examination, requested by authority: Secondary | ICD-10-CM | POA: Diagnosis not present

## 2018-11-18 DIAGNOSIS — Z818 Family history of other mental and behavioral disorders: Secondary | ICD-10-CM

## 2018-11-18 DIAGNOSIS — Z20828 Contact with and (suspected) exposure to other viral communicable diseases: Secondary | ICD-10-CM | POA: Diagnosis not present

## 2018-11-18 DIAGNOSIS — J309 Allergic rhinitis, unspecified: Secondary | ICD-10-CM | POA: Diagnosis not present

## 2018-11-18 DIAGNOSIS — F411 Generalized anxiety disorder: Secondary | ICD-10-CM | POA: Diagnosis present

## 2018-11-18 DIAGNOSIS — K2270 Barrett's esophagus without dysplasia: Secondary | ICD-10-CM | POA: Diagnosis present

## 2018-11-18 DIAGNOSIS — R45851 Suicidal ideations: Secondary | ICD-10-CM | POA: Diagnosis present

## 2018-11-18 DIAGNOSIS — E039 Hypothyroidism, unspecified: Secondary | ICD-10-CM | POA: Diagnosis not present

## 2018-11-18 LAB — URINE DRUG SCREEN, QUALITATIVE (ARMC ONLY)
Amphetamines, Ur Screen: NOT DETECTED
Barbiturates, Ur Screen: NOT DETECTED
Benzodiazepine, Ur Scrn: NOT DETECTED
Cannabinoid 50 Ng, Ur ~~LOC~~: NOT DETECTED
Cocaine Metabolite,Ur ~~LOC~~: NOT DETECTED
MDMA (Ecstasy)Ur Screen: NOT DETECTED
Methadone Scn, Ur: NOT DETECTED
Opiate, Ur Screen: NOT DETECTED
Phencyclidine (PCP) Ur S: NOT DETECTED
Tricyclic, Ur Screen: NOT DETECTED

## 2018-11-18 MED ORDER — ACETAMINOPHEN 325 MG PO TABS: 650.0000 mg | ORAL_TABLET | Freq: Four times a day (QID) | ORAL | Status: DC | PRN

## 2018-11-18 MED ORDER — ROSUVASTATIN CALCIUM 5 MG PO TABS
10.0000 mg | ORAL_TABLET | Freq: Every day | ORAL | Status: DC
  Administered 2018-11-19 – 2018-11-25 (×7): 10 mg via ORAL
  Filled 2018-11-18: qty 1
  Filled 2018-11-18 (×7): qty 2
  Filled 2018-11-18 (×2): qty 1

## 2018-11-18 MED ORDER — POTASSIUM CHLORIDE CRYS ER 20 MEQ PO TBCR
40.0000 meq | EXTENDED_RELEASE_TABLET | Freq: Once | ORAL | Status: AC
  Administered 2018-11-18: 40 meq via ORAL

## 2018-11-18 MED ORDER — DIPHENOXYLATE-ATROPINE 2.5-0.025 MG PO TABS: 2.0000 | ORAL_TABLET | Freq: Four times a day (QID) | ORAL | Status: DC | PRN

## 2018-11-18 MED ORDER — FLUTICASONE PROPIONATE 50 MCG/ACT NA SUSP
1.0000 | Freq: Every day | NASAL | Status: DC
  Administered 2018-11-19 – 2018-11-25 (×4): 1 via NASAL
  Filled 2018-11-18 (×2): qty 16

## 2018-11-18 MED ORDER — HYOSCYAMINE SULFATE 0.125 MG PO TBDP
0.1250 mg | ORAL_TABLET | ORAL | Status: DC | PRN
  Filled 2018-11-18 (×2): qty 1

## 2018-11-18 MED ORDER — TRAZODONE HCL 50 MG PO TABS
50.0000 mg | ORAL_TABLET | Freq: Every evening | ORAL | Status: DC | PRN
  Administered 2018-11-20 – 2018-11-23 (×4): 50 mg via ORAL
  Filled 2018-11-18 (×4): qty 1

## 2018-11-18 MED ORDER — LORAZEPAM 1 MG PO TABS
1.0000 mg | ORAL_TABLET | ORAL | Status: DC | PRN
  Filled 2018-11-18: qty 1

## 2018-11-18 MED ORDER — ADULT MULTIVITAMIN W/MINERALS CH
1.0000 | ORAL_TABLET | Freq: Every day | ORAL | Status: DC
  Administered 2018-11-19 – 2018-11-25 (×7): 1 via ORAL
  Filled 2018-11-18 (×9): qty 1

## 2018-11-18 MED ORDER — MAGNESIUM HYDROXIDE 400 MG/5ML PO SUSP: 30.0000 mL | Freq: Every day | ORAL | Status: DC | PRN

## 2018-11-18 MED ORDER — ELUXADOLINE 100 MG PO TABS: 100.0000 mg | ORAL_TABLET | Freq: Two times a day (BID) | ORAL | Status: DC

## 2018-11-18 MED ORDER — AMLODIPINE BESYLATE 5 MG PO TABS
5.0000 mg | ORAL_TABLET | Freq: Every day | ORAL | Status: DC
  Administered 2018-11-19 – 2018-11-25 (×7): 5 mg via ORAL
  Filled 2018-11-18 (×9): qty 1

## 2018-11-18 MED ORDER — RISPERIDONE 2 MG PO TBDP: 2.0000 mg | ORAL_TABLET | Freq: Three times a day (TID) | ORAL | Status: DC | PRN

## 2018-11-18 MED ORDER — HYDROXYZINE HCL 25 MG PO TABS: 25.0000 mg | ORAL_TABLET | Freq: Four times a day (QID) | ORAL | Status: DC | PRN

## 2018-11-18 MED ORDER — ZIPRASIDONE MESYLATE 20 MG IM SOLR: 20.0000 mg | INTRAMUSCULAR | Status: DC | PRN

## 2018-11-18 MED ORDER — VITAMIN C 250 MG PO TABS
250.0000 mg | ORAL_TABLET | Freq: Every day | ORAL | Status: DC
  Administered 2018-11-19 – 2018-11-25 (×7): 250 mg via ORAL
  Filled 2018-11-18 (×9): qty 1

## 2018-11-18 MED ORDER — PANTOPRAZOLE SODIUM 40 MG PO TBEC
40.0000 mg | DELAYED_RELEASE_TABLET | Freq: Every day | ORAL | Status: DC
  Administered 2018-11-19 – 2018-11-25 (×7): 40 mg via ORAL
  Filled 2018-11-18 (×9): qty 1

## 2018-11-18 MED ORDER — ALUM & MAG HYDROXIDE-SIMETH 200-200-20 MG/5ML PO SUSP: 30.0000 mL | ORAL | Status: DC | PRN

## 2018-11-18 NOTE — ED Provider Notes (Signed)
-----------------------------------------   10:21 AM on 11/18/2018 -----------------------------------------  Patient has been seen and evaluated by psychiatry they will be admitting to their service once a bed becomes available.   Harvest Dark, MD 11/18/18 1021

## 2018-11-18 NOTE — ED Notes (Signed)
BEHAVIORAL HEALTH ROUNDING Patient sleeping: No. Patient alert and oriented: yes Behavior appropriate: Yes.  ; If no, describe:  Nutrition and fluids offered: yes Toileting and hygiene offered: Yes  Sitter present: q15 minute observations and security camera monitoring Law enforcement present: Yes  ODS  

## 2018-11-18 NOTE — BH Assessment (Signed)
Assessment Note  William Coleman. is an 58 y.o. male who presents to ED reporting increased depression and suicidal thoughts under IVC. Per review of pt's chart,  The patient states he is extremely depressed and suicidal with a plan to run into traffic and have a car run over him.  This is based on him having multiples physical health problems and a recent break-up with his girlfriend of 2 years.  He states, his relationship was the only happiness in his life and now it is over.  He states there is no point in trying to live for life and leave it like this. The patient was seen face-to-face by this provider; chart reviewed and consulted with Dr. Karma Greaser on 11/18/2018 due to the care of the patient.It was discussed with the provider that the patient does meet criteria to be admitted to the inpatient psychiatric unit once a bed becomes available. On evaluation the patient is alert and oriented x4, calm and cooperative, and mood-congruent with affect.   Diagnosis: Major Depressive Disorder  Past Medical History:  Past Medical History:  Diagnosis Date  . Anancastic neurosis   . Anginal pain ([place])   . Anxiety, generalized   . Bipolar disorder ([place])   . Chronic diarrhea   . Depression   . Hypertension   . Hypothyroidism   . Obsessive compulsive disorder   . OCD (obsessive compulsive disorder)   . Sleep apnea    patient states it was "marginal", no CPAP    Past Surgical History:  Procedure Laterality Date  . COLONOSCOPY WITH PROPOFOL N/A 02/05/2016   Procedure: COLONOSCOPY WITH PROPOFOL;  Surgeon: [name], MD;  Location: [facility];  Service: Endoscopy;  Laterality: N/A;  . MOUTH SURGERY     orthodontic surgery for underbite  . MYRINGOTOMY WITH TUBE PLACEMENT    . NASAL SEPTUM SURGERY    . POLYPECTOMY N/A 02/05/2016   Procedure: POLYPECTOMY;  Surgeon: [name], MD;  Location: [facility];  Service: Endoscopy;  Laterality: N/A;    Family History:  Family  History  Problem Relation Age of Onset  . Depression Mother   . Anxiety disorder Mother   . Congestive Heart Failure Father   . Prostate cancer Father   . Hypertension Father     Social History:  reports that he has never smoked. He has never used smokeless tobacco. He reports current alcohol use. He reports that he does not use drugs.  Additional Social History:  Alcohol / Drug Use Pain Medications: See PTA Prescriptions: See PTA Over the Counter: See PTA History of alcohol / drug use?: No history of alcohol / drug abuse Longest period of sobriety (when/how long): Denies past and current use Negative Consequences of Use: (Denies past and current) Withdrawal Symptoms: (Pt denied)  CIWA: CIWA-Ar BP: 118/88 Pulse Rate: 72 COWS:    Allergies:  Allergies  Allergen Reactions  . Penicillins Hives and Rash  . Sulfa Antibiotics Hives  . Amoxicillin Hives    Home Medications:  Medications Prior to Admission  Medication Sig Dispense Refill  . amLODipine (NORVASC) 5 MG tablet Take 1 tablet (5 mg total) by mouth daily. 90 tablet 3  . Ascorbic Acid (VITAMIN C) 100 MG tablet Take 100 mg by mouth daily.    . diphenoxylate-atropine (LOMOTIL) 2.5-0.025 MG tablet Take 2 tablets by mouth 4 (four) times daily as needed for diarrhea or loose stools. 240 tablet 5  . Eluxadoline (VIBERZI) 100 MG TABS Take 1 tablet (100  mg total) by mouth 2 (two) times daily. 60 tablet 5  . fluticasone (FLONASE) 50 MCG/ACT nasal spray 2 sprays by Each Nare route two (2) times a day.    . hyoscyamine (ANASPAZ) 0.125 MG TBDP disintergrating tablet Place 1 tablet (0.125 mg total) under the tongue every 4 (four) hours as needed. 180 tablet 1  . Multiple Vitamin (MULTIVITAMIN WITH MINERALS) TABS tablet Take 1 tablet by mouth daily.    Marland Kitchen omeprazole (PRILOSEC) 20 MG capsule Take 20 mg by mouth daily.    . rosuvastatin (CRESTOR) 10 MG tablet Take 1 tablet (10 mg total) by mouth daily. 90 tablet 3    OB/GYN Status:  No  LMP for male patient.  General Assessment Data Location of Assessment: [facility] ED TTS Assessment: In system Is this a Tele or Face-to-Face Assessment?: Face-to-Face Is this an Initial Assessment or a Re-assessment for this encounter?: Initial Assessment Patient Accompanied by:: N/A Language Other than English: No Living Arrangements: Other (Comment)(Private Residence) What gender do you identify as?: Male Marital status: Single Maiden name: N/A Living Arrangements: Alone Can pt return to current living arrangement?: Yes Admission Status: Involuntary Petitioner: Police Is patient capable of signing voluntary admission?: No Referral Source: Other(Police) Insurance type: Insurance risk surveyor Exam ([place]) Medical Exam completed: Yes  Crisis Care Plan Living Arrangements: Alone Legal Guardian: Other:(Self) Name of Psychiatrist: [place] Name of Therapist: [facility]  Education Status Is patient currently in school?: No Is the patient employed, unemployed or receiving disability?: Employed  Risk to self with the past 6 months Suicidal Ideation: Yes-Currently Present Has patient been a risk to self within the past 6 months prior to admission? : Yes Suicidal Intent: Yes-Currently Present Has patient had any suicidal intent within the past 6 months prior to admission? : Yes Is patient at risk for suicide?: Yes Suicidal Plan?: Yes-Currently Present Has patient had any suicidal plan within the past 6 months prior to admission? : Yes Specify Current Suicidal Plan: To run into traffic and have a car run over him Access to Means: Yes Specify Access to Suicidal Means: Access to a road What has been your use of drugs/alcohol within the last 12 months?: None Previous Attempts/Gestures: No How many times?: 0 Other Self Harm Risks: [facility] Triggers for Past Attempts: Unknown Intentional Self Injurious Behavior: None Family Suicide History: Unknown Recent stressful life event(s): Loss  (Comment)(Recent break-up) Persecutory voices/beliefs?: No Depression: Yes Depression Symptoms: Despondent, Insomnia, Tearfulness, Isolating, Fatigue, Loss of interest in usual pleasures, Guilt, Feeling worthless/self pity Substance abuse history and/or treatment for substance abuse?: No Suicide prevention information given to non-admitted patients: Not applicable  Risk to Others within the past 6 months Homicidal Ideation: No Does patient have any lifetime risk of violence toward others beyond the six months prior to admission? : No Thoughts of Harm to Others: No Current Homicidal Intent: No Current Homicidal Plan: No Access to Homicidal Means: No Identified Victim: None History of harm to others?: No Assessment of Violence: None Noted Violent Behavior Description: None Does patient have access to weapons?: No Criminal Charges Pending?: No Does patient have a court date: No Is patient on probation?: No  Psychosis Hallucinations: None noted Delusions: None noted  Mental Status Report Appearance/Hygiene: Disheveled Eye Contact: Unable to Assess Motor Activity: Unable to assess Speech: Unable to assess Level of Consciousness: Unable to assess Mood: (UTA) Affect: Unable to Assess Anxiety Level: Minimal Thought Processes: Coherent, Relevant Judgement: Impaired Orientation: Person, Place, Time, Situation Obsessive Compulsive Thoughts/Behaviors:  None  Cognitive Functioning Concentration: Normal Memory: Recent Intact, Remote Intact Is patient IDD: No Insight: Poor Impulse Control: Poor Appetite: (UTA) Have you had any weight changes? : (UTA) Sleep: Unable to Assess Total Hours of Sleep: [facility]) Vegetative Symptoms: None  ADLScreening [facility] Assessment Services) Patient's cognitive ability adequate to safely complete daily activities?: Yes Patient able to express need for assistance with ADLs?: Yes Independently performs ADLs?: Yes (appropriate for developmental  age)  Prior Inpatient Therapy Prior Inpatient Therapy: [facility])  Prior Outpatient Therapy Prior Outpatient Therapy: [facility])  ADL Screening (condition at time of admission) Patient's cognitive ability adequate to safely complete daily activities?: Yes Is the patient deaf or have difficulty hearing?: No Does the patient have difficulty seeing, even when wearing glasses/contacts?: No Does the patient have difficulty concentrating, remembering, or making decisions?: No Patient able to express need for assistance with ADLs?: Yes Does the patient have difficulty dressing or bathing?: No Independently performs ADLs?: Yes (appropriate for developmental age) Does the patient have difficulty walking or climbing stairs?: No Weakness of Legs: None Weakness of Arms/Hands: None  Home Assistive Devices/Equipment Home Assistive Devices/Equipment: None  Therapy Consults (therapy consults require a physician order) PT Evaluation Needed: No OT Evalulation Needed: No SLP Evaluation Needed: No Abuse/Neglect Assessment (Assessment to be complete while patient is alone) Abuse/Neglect Assessment Can Be Completed: Yes Physical Abuse: Denies Verbal Abuse: Denies Sexual Abuse: Denies Exploitation of patient/patient's resources: Denies Self-Neglect: Denies Values / Beliefs Cultural Requests During Hospitalization: None Spiritual Requests During Hospitalization: None Consults Spiritual Care Consult Needed: No Social Work Consult Needed: No Regulatory affairs officer (For Healthcare) Does Patient Have a Medical Advance Directive?: No Would patient like information on creating a medical advance directive?: No - Patient declined Nutrition Screen- [facility] Adult/WL/AP Patient's home diet: Regular Has the patient recently lost weight without trying?: No Has the patient been eating poorly because of a decreased appetite?: No Malnutrition Screening Tool Score: 0     Child/Adolescent Assessment Running Away Risk:  (Patient is an adult)  Disposition:  Disposition Initial Assessment Completed for this Encounter: Yes Disposition of Patient: Admit Type of inpatient treatment program: Adult Patient refused recommended treatment: No Mode of transportation if patient is discharged/movement?: N/A Patient referred to: Other (Comment)([facility])  On Site Evaluation by:   Reviewed with Physician:    Frederich Cha 11/18/2018 9:32 PM

## 2018-11-18 NOTE — ED Provider Notes (Signed)
[facility] Emergency Department Provider Note  ____________________________________________   First MD Initiated Contact with Patient 11/17/18 2301     (approximate)  I have reviewed the triage vital signs and the nursing notes.   HISTORY  Chief Complaint Mental Health Problem    HPI William Coleman William Coleman. is a 58 y.o. male with medical history as listed below who presents for evaluation of depression and suicidal ideation.  He presents under involuntary commitment.  He admits to a psychiatric history that includes depression and prior hospitalization for suicidal ideation.  He says he has been going through a rough time recently including a recent break-up with his girlfriend and he has gradually been getting worse in terms of his depression to where it is now severe and he is having thoughts of suicide.  He is frustrated over his chronic gastrointestinal issues and chronic sinus issues and says that he has been to numerous doctors and multiple medical centers and they have not been able to help him.  He has no new or acute medical complaints at this time, just persistent issues that have been present for years that include occasional nausea, vomiting, abdominal pain, and diarrhea.  He denies fever/chills, sore throat, chest pain, shortness of breath, and dysuria.  He says that he does not take his psychiatric medication because it makes him feel exhausted and he does not like the side effects.         Past Medical History:  Diagnosis Date  . Anancastic neurosis   . Anginal pain ([place])   . Anxiety, generalized   . Bipolar disorder ([place])   . Chronic diarrhea   . Depression   . Hypertension   . Hypothyroidism   . Obsessive compulsive disorder   . OCD (obsessive compulsive disorder)   . Sleep apnea    patient states it was "marginal", no CPAP    Patient Active Problem List   Diagnosis Date Noted  . Laryngopharyngeal reflux (LPR) 01/01/2018  .  Deviated nasal septum 01/01/2018  . Non-seasonal allergic rhinitis 01/01/2018  . Incontinence of feces 11/10/2017  . OCD (obsessive compulsive disorder) 01/01/2017  . Severe recurrent major depression without psychotic features ([place]) 11/10/2015  . HLD (hyperlipidemia) 11/10/2014  . HTN (hypertension) 11/10/2014  . Adult hypothyroidism 11/10/2014  . 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/10/2014    Past Surgical History:  Procedure Laterality Date  . COLONOSCOPY WITH PROPOFOL N/A 02/05/2016   Procedure: COLONOSCOPY WITH PROPOFOL;  Surgeon: [name], MD;  Location: [facility];  Service: Endoscopy;  Laterality: N/A;  . MOUTH SURGERY     orthodontic surgery for underbite  . MYRINGOTOMY WITH TUBE PLACEMENT    . NASAL SEPTUM SURGERY    . POLYPECTOMY N/A 02/05/2016   Procedure: POLYPECTOMY;  Surgeon: [name], MD;  Location: [facility];  Service: Endoscopy;  Laterality: N/A;    Prior to Admission medications   Medication Sig Start Date End Date Taking? Authorizing Provider  amLODipine (NORVASC) 5 MG tablet Take 1 tablet (5 mg total) by mouth daily. 03/26/18   Jerrol Banana., MD  Ascorbic Acid (VITAMIN C) 100 MG tablet Take 100 mg by mouth daily.    [provider]  Azelastine-Fluticasone (DYMISTA) 137-50 MCG/ACT SUSP Place 2 sprays into the nose daily. Patient not taking: Reported on 05/26/2017 03/19/17   Wilhelmina Mcardle, MD  cholestyramine light (PREVALITE) 4 g packet One scoop daily 06/11/18   Jerrol Banana., MD  diphenoxylate-atropine (LOMOTIL) 2.5-0.025 MG  tablet Take 2 tablets by mouth 4 (four) times daily as needed for diarrhea or loose stools. 07/30/18   [name], MD  Eluxadoline (VIBERZI) 100 MG TABS Take 1 tablet (100 mg total) by mouth 2 (two) times daily. 07/30/18   [name], MD  fexofenadine [facility] ALLERGY) 180 MG tablet Take 1 tablet (180 mg total) by mouth daily. Patient not taking: Reported on 08/25/2017 03/19/17   Wilhelmina Mcardle, MD   hyoscyamine (ANASPAZ) 0.125 MG TBDP disintergrating tablet Place 1 tablet (0.125 mg total) under the tongue every 4 (four) hours as needed. 10/09/18   Jerrol Banana., MD  levothyroxine (SYNTHROID, LEVOTHROID) 25 MCG tablet Take 1 tablet (25 mcg total) by mouth daily. 07/03/17 10/01/17  Jerrol Banana., MD  loperamide (IMODIUM) 1 MG/5ML solution Take by mouth as needed for diarrhea or loose stools.    [provider]  Multiple Vitamin (MULTIVITAMIN WITH MINERALS) TABS tablet Take 1 tablet by mouth daily.    [provider]  omeprazole (PRILOSEC) 20 MG capsule Take 20 mg by mouth daily.    [provider]  rosuvastatin (CRESTOR) 10 MG tablet Take 1 tablet (10 mg total) by mouth daily. 03/26/18   Jerrol Banana., MD  simethicone (MYLICON) 80 MG chewable tablet Chew 80 mg by mouth every 6 (six) hours as needed for flatulence.    [provider]  VIAGRA 50 MG tablet Take 50 mg by mouth as needed for erectile dysfunction.     [provider]  Vit B6-Vit B12-Omega 3 Acids (VITAMIN B PLUS+ PO) Take by mouth.    [provider]    Allergies Penicillins; Sulfa antibiotics; and Amoxicillin  Family History  Problem Relation Age of Onset  . Depression Mother   . Anxiety disorder Mother   . Congestive Heart Failure Father   . Prostate cancer Father   . Hypertension Father     Social History Social History   Tobacco Use  . Smoking status: Never Smoker  . Smokeless tobacco: Never Used  Substance Use Topics  . Alcohol use: Yes    Comment: rarely  . Drug use: No    Review of Systems Constitutional: No fever/chills Eyes: No visual changes. ENT: No sore throat. Cardiovascular: Denies chest pain. Respiratory: Denies shortness of breath. Gastrointestinal: Chronic gastrointestinal issues, nothing acute. Genitourinary: Negative for dysuria. Musculoskeletal: Negative for neck pain.  Negative for back pain. Integumentary:  Negative for rash. Neurological: Negative for headaches, focal weakness or numbness. Psychiatric:  Depression with suicidal ideation.  ____________________________________________   PHYSICAL EXAM:  VITAL SIGNS: ED Triage Vitals [11/17/18 2133]  Enc Vitals Group     BP (!) 138/91     Pulse Rate 69     Resp 18     Temp 97.9 F (36.6 C)     Temp Source Oral     SpO2 97 %     Weight 64.4 kg (142 lb)     Height 1.702 m (5\' 7" )     Head Circumference      Peak Flow      Pain Score 0     Pain Loc      Pain Edu?      Excl. in [place]?     Constitutional: Alert and oriented. Well appearing and in no acute distress. Eyes: Conjunctivae are normal.  Head: Atraumatic. Nose: No congestion/rhinnorhea. Mouth/Throat: Mucous membranes are moist. Neck: No stridor.  No meningeal signs.   Cardiovascular: Normal rate, regular  rhythm. Good peripheral circulation. Grossly normal heart sounds. Respiratory: Normal respiratory effort.  No retractions. No audible wheezing. Gastrointestinal: Soft and nontender. No distention.  Musculoskeletal: No lower extremity tenderness nor edema. No gross deformities of extremities. Neurologic:  Normal speech and language. No gross focal neurologic deficits are appreciated.  Skin:  Skin is warm, dry and intact. No rash noted. Psychiatric: Mood and affect are flat and blunted, depressed, admits to suicidal ideation.  Mild anxiety.  ____________________________________________   LABS (all labs ordered are listed, but only abnormal results are displayed)  Labs Reviewed  COMPREHENSIVE METABOLIC PANEL - Abnormal; Notable for the following components:      Result Value   Potassium 3.1 (*)    BUN 22 (*)    Total Bilirubin 1.3 (*)    All other components within normal limits  ACETAMINOPHEN LEVEL - Abnormal; Notable for the following components:   Acetaminophen (Tylenol), Serum <10 (*)    All other components within normal limits  NOVEL CORONAVIRUS, NAA (HOSPITAL  ORDER, SEND-OUT TO REF LAB)  ETHANOL  SALICYLATE LEVEL  CBC  URINE DRUG SCREEN, QUALITATIVE ([facility] ONLY)   ____________________________________________  EKG  None - EKG not ordered by ED physician ____________________________________________  RADIOLOGY   ED MD interpretation: No indication for imaging  Official radiology report(s): No results found.  ____________________________________________   PROCEDURES   Procedure(s) performed (including Critical Care):  Procedures   ____________________________________________   INITIAL IMPRESSION / MDM / [place] / ED COURSE  As part of my medical decision making, I reviewed the following data within the [place] notes reviewed and incorporated, Labs reviewed , A consult was requested and obtained from this/these consultant(s) Psychiatry, Notes from prior ED visits and [place] Controlled Substance [place]. was evaluated in Emergency Department on 11/18/2018 for the symptoms described in the history of present illness. He was evaluated in the context of the global COVID-19 pandemic, which necessitated consideration that the patient might be at risk for infection with the SARS-CoV-2 virus that causes COVID-19. Institutional protocols and algorithms that pertain to the evaluation of patients at risk for COVID-19 are in a state of rapid change based on information released by regulatory bodies including the CDC and federal and state organizations. These policies and algorithms were followed during the patient's care in the ED.  Some ED evaluations and interventions may be delayed as a result of limited staffing during the pandemic.*  Differential diagnosis includes, but is not limited to, major depressive disorder, adjustment disorder, mood disorder, chronic medical issues contributing to his psychiatric symptoms.  The patient is generally well-appearing and in no distress.   Vital signs are stable.  He has mild hypokalemia but his lab work is otherwise unremarkable.  I have ordered a potassium supplement 40 mEq by mouth.  I have ordered a psychiatric consult and upheld the involuntary commitment.  I suspect he will benefit from inpatient psychiatric treatment but he is medically cleared and disposition will be as per psychiatric recommendations.      ____________________________________________  FINAL CLINICAL IMPRESSION(S) / ED DIAGNOSES  Final diagnoses:  Depression, unspecified depression type  Hypokalemia     MEDICATIONS GIVEN DURING THIS VISIT:  Medications  potassium chloride SA (K-DUR) CR tablet 40 mEq (40 mEq Oral Given 11/18/18 0043)     ED Discharge Orders    None       Note:  This document was prepared using Dragon voice  recognition software and may include unintentional dictation errors.   Hinda Kehr, MD 11/18/18 [phone]

## 2018-11-18 NOTE — Consult Note (Addendum)
[facility] Consult   Reason for Consult: Suicidal ideation Referring Physician: Dr. Karma Greaser Patient Identification: William Coleman. MRN:  [id number] Principal Diagnosis: Depression Diagnosis:  Principal Problem:   Depression   Total Time spent with patient: 1 hour  Subjective: "My health problems and my girlfriend just ended our two years relationship." William Aguas Revere Maahs. is a 58 y.o. male patient presented to [facility] ED via law enforcement under involuntary commitment status (IVC).  The patient states he is extremely depressed and suicidal with a plan to run into traffic and have a car run over him.  This is based on him having multiples physical health problems and a recent break-up with his girlfriend of 2 years.  He states, his relationship was the only happiness in his life and now it is over.  He states there is no point in trying to live for life and leave it like this.  The patient was seen face-to-face by this provider; chart reviewed and consulted with Dr. Karma Greaser  on 11/18/2018 due to the care of the patient. It was discussed with the provider that the patient does meet criteria to be admitted to the inpatient psychiatric unit once a bed becomes available. On evaluation the patient is alert and oriented x4, calm and cooperative, and mood-congruent with affect.  The patient does not appear to be responding to internal or external stimuli. Neither is the patient presenting with any delusional thinking. The patient denies auditory or visual hallucinations. The patient admits to suicidal and self-harm ideations, but denies homicidal ideation.  The patient is not presenting with any psychotic or paranoid behaviors. During an encounter with the patient, he/she was able to answer questions appropriately. Collateral was not obtained due to the patient stating he has no one that knows what has been going on in his life.  Plan: The patient is a safety risk to self  and does require psychiatric inpatient admission for stabilization and treatment.  HPI:  Per Dr. Karma Greaser; William Aguas Letroy Vazguez. is a 58 y.o. male with medical history as listed below who presents for evaluation of depression and suicidal ideation.  He presents under involuntary commitment.  He admits to a psychiatric history that includes depression and prior hospitalization for suicidal ideation.  He says he has been going through a rough time recently including a recent break-up with his girlfriend and he has gradually been getting worse in terms of his depression to where it is now severe and he is having thoughts of suicide.  He is frustrated over his chronic gastrointestinal issues and chronic sinus issues and says that he has been to numerous doctors and multiple medical centers and they have not been able to help him.  He has no new or acute medical complaints at this time, just persistent issues that have been present for years that include occasional nausea, vomiting, abdominal pain, and diarrhea.  He denies fever/chills, sore throat, chest pain, shortness of breath, and dysuria.  He says that he does not take his psychiatric medication because it makes him feel exhausted and he does not like the side effects.  Past Psychiatric History:  Anxiety/generalized Bipolar disorder ([place]) Depression Obsessive-compulsive disorder  Risk to Self:  Yes Risk to Others:  No Prior Inpatient Therapy:  Yes Prior Outpatient Therapy:  Yes  Past Medical History:  Past Medical History:  Diagnosis Date  . Anancastic neurosis   . Anginal pain ([place])   . Anxiety, generalized   . Bipolar disorder (  HCC)   . Chronic diarrhea   . Depression   . Hypertension   . Hypothyroidism   . Obsessive compulsive disorder   . OCD (obsessive compulsive disorder)   . Sleep apnea    patient states it was "marginal", no CPAP    Past Surgical History:  Procedure Laterality Date  . COLONOSCOPY WITH PROPOFOL N/A  02/05/2016   Procedure: COLONOSCOPY WITH PROPOFOL;  Surgeon: [name], MD;  Location: [facility];  Service: Endoscopy;  Laterality: N/A;  . MOUTH SURGERY     orthodontic surgery for underbite  . MYRINGOTOMY WITH TUBE PLACEMENT    . NASAL SEPTUM SURGERY    . POLYPECTOMY N/A 02/05/2016   Procedure: POLYPECTOMY;  Surgeon: [name], MD;  Location: [facility];  Service: Endoscopy;  Laterality: N/A;   Family History:  Family History  Problem Relation Age of Onset  . Depression Mother   . Anxiety disorder Mother   . Congestive Heart Failure Father   . Prostate cancer Father   . Hypertension Father    Family Psychiatric  History:  Social History:  Social History   Substance and Sexual Activity  Alcohol Use Yes   Comment: rarely     Social History   Substance and Sexual Activity  Drug Use No    Social History   Socioeconomic History  . Marital status: Single    Spouse name: Not on file  . Number of children: Not on file  . Years of education: Not on file  . Highest education level: Not on file  Occupational History  . Not on file  Social Needs  . Financial resource strain: Not on file  . Food insecurity:    Worry: Not on file    Inability: Not on file  . Transportation needs:    Medical: Not on file    Non-medical: Not on file  Tobacco Use  . Smoking status: Never Smoker  . Smokeless tobacco: Never Used  Substance and Sexual Activity  . Alcohol use: Yes    Comment: rarely  . Drug use: No  . Sexual activity: Never    Birth control/protection: Abstinence  Lifestyle  . Physical activity:    Days per week: Not on file    Minutes per session: Not on file  . Stress: Not on file  Relationships  . Social connections:    Talks on phone: Not on file    Gets together: Not on file    Attends religious service: Not on file    Active member of club or organization: Not on file    Attends meetings of clubs or organizations: Not on file     Relationship status: Not on file  Other Topics Concern  . Not on file  Social History Narrative  . Not on file   Additional Social History:    Allergies:   Allergies  Allergen Reactions  . Penicillins Hives and Rash  . Sulfa Antibiotics Hives  . Amoxicillin Hives    Labs:  Results for orders placed or performed during the hospital encounter of 11/17/18 (from the past 48 hour(s))  Comprehensive metabolic panel     Status: Abnormal   Collection Time: 11/17/18  9:38 PM  Result Value Ref Range   Sodium 137 135 - 145 mmol/L   Potassium 3.1 (L) 3.5 - 5.1 mmol/L   Chloride 98 98 - 111 mmol/L   CO2 25 22 - 32 mmol/L   Glucose, Bld 81 70 - 99 mg/dL  BUN 22 (H) 6 - 20 mg/dL   Creatinine, Ser 1.17 0.61 - 1.24 mg/dL   Calcium 9.5 8.9 - 10.3 mg/dL   Total Protein 7.8 6.5 - 8.1 g/dL   Albumin 5.0 3.5 - 5.0 g/dL   AST 28 15 - 41 U/L   ALT 22 0 - 44 U/L   Alkaline Phosphatase 75 38 - 126 U/L   Total Bilirubin 1.3 (H) 0.3 - 1.2 mg/dL   GFR calc non Af Amer >60 >60 mL/min   GFR calc Af Amer >60 >60 mL/min   Anion gap 14 5 - 15    Comment: Performed at [facility], [address], [place], [place] [address]  Ethanol     Status: None   Collection Time: 11/17/18  9:38 PM  Result Value Ref Range   Alcohol, Ethyl (B) <10 <10 mg/dL    Comment: (NOTE) Lowest detectable limit for serum alcohol is 10 mg/dL. For medical purposes only. Performed at [facility], [address] [place], [place] [address]   Salicylate level     Status: None   Collection Time: 11/17/18  9:38 PM  Result Value Ref Range   Salicylate Lvl <3.6 2.8 - 30.0 mg/dL    Comment: Performed at [facility], [address] [place], [place] [address]  Acetaminophen level     Status: Abnormal   Collection Time: 11/17/18  9:38 PM  Result Value Ref Range   Acetaminophen (Tylenol), Serum <10 (L) 10 - 30 ug/mL    Comment: (NOTE) Therapeutic concentrations vary significantly. A range of 10-30  ug/mL  may be an effective concentration for many patients. However, some  are best treated at concentrations outside of this range. Acetaminophen concentrations >150 ug/mL at 4 hours after ingestion  and >50 ug/mL at 12 hours after ingestion are often associated with  toxic reactions. Performed at [facility], [address] [place], [place] [address]   cbc     Status: None   Collection Time: 11/17/18  9:38 PM  Result Value Ref Range   WBC 9.2 4.0 - 10.5 K/uL   RBC 5.09 4.22 - 5.81 MIL/uL   Hemoglobin 15.7 13.0 - 17.0 g/dL   HCT 43.8 39.0 - 52.0 %   MCV 86.1 80.0 - 100.0 fL   MCH 30.8 26.0 - 34.0 pg   MCHC 35.8 30.0 - 36.0 g/dL   RDW 12.4 11.5 - 15.5 %   Platelets 280 150 - 400 K/uL   nRBC 0.0 0.0 - 0.2 %    Comment: Performed at [facility], [address], [place], [place] [address]  Urine Drug Screen, Qualitative     Status: None   Collection Time: 11/17/18  9:38 PM  Result Value Ref Range   Tricyclic, Ur Screen NONE DETECTED NONE DETECTED   Amphetamines, Ur Screen NONE DETECTED NONE DETECTED   MDMA (Ecstasy)Ur Screen NONE DETECTED NONE DETECTED   Cocaine Metabolite,Ur [place] NONE DETECTED NONE DETECTED   Opiate, Ur Screen NONE DETECTED NONE DETECTED   Phencyclidine (PCP) Ur S NONE DETECTED NONE DETECTED   Cannabinoid 50 Ng, Ur [place] NONE DETECTED NONE DETECTED   Barbiturates, Ur Screen NONE DETECTED NONE DETECTED   Benzodiazepine, Ur Scrn NONE DETECTED NONE DETECTED   Methadone Scn, Ur NONE DETECTED NONE DETECTED    Comment: (NOTE) Tricyclics + metabolites, urine    Cutoff 1000 ng/mL Amphetamines + metabolites, urine  Cutoff 1000 ng/mL MDMA (Ecstasy), urine  Cutoff 500 ng/mL Cocaine Metabolite, urine          Cutoff 300 ng/mL Opiate + metabolites, urine        Cutoff 300 ng/mL Phencyclidine (PCP), urine         Cutoff 25 ng/mL Cannabinoid, urine                 Cutoff 50 ng/mL Barbiturates + metabolites, urine  Cutoff 200  ng/mL Benzodiazepine, urine              Cutoff 200 ng/mL Methadone, urine                   Cutoff 300 ng/mL The urine drug screen provides only a preliminary, unconfirmed analytical test result and should not be used for non-medical purposes. Clinical consideration and professional judgment should be applied to any positive drug screen result due to possible interfering substances. A more specific alternate chemical method must be used in order to obtain a confirmed analytical result. Gas chromatography / mass spectrometry (GC/MS) is the preferred confirmat ory method. Performed at [facility], [address] [place], [place] [address]     No current facility-administered medications for this encounter.    Current Outpatient Medications  Medication Sig Dispense Refill  . amLODipine (NORVASC) 5 MG tablet Take 1 tablet (5 mg total) by mouth daily. 90 tablet 3  . Ascorbic Acid (VITAMIN C) 100 MG tablet Take 100 mg by mouth daily.    . Azelastine-Fluticasone (DYMISTA) 137-50 MCG/ACT SUSP Place 2 sprays into the nose daily. (Patient not taking: Reported on 05/26/2017) 1 Bottle 10  . cholestyramine light (PREVALITE) 4 g packet One scoop daily 1 packet 0  . diphenoxylate-atropine (LOMOTIL) 2.5-0.025 MG tablet Take 2 tablets by mouth 4 (four) times daily as needed for diarrhea or loose stools. 240 tablet 5  . Eluxadoline (VIBERZI) 100 MG TABS Take 1 tablet (100 mg total) by mouth 2 (two) times daily. 60 tablet 5  . fexofenadine (ALLEGRA ALLERGY) 180 MG tablet Take 1 tablet (180 mg total) by mouth daily. (Patient not taking: Reported on 08/25/2017) 30 tablet 5  . hyoscyamine (ANASPAZ) 0.125 MG TBDP disintergrating tablet Place 1 tablet (0.125 mg total) under the tongue every 4 (four) hours as needed. 180 tablet 1  . levothyroxine (SYNTHROID, LEVOTHROID) 25 MCG tablet Take 1 tablet (25 mcg total) by mouth daily. 90 tablet 3  . loperamide (IMODIUM) 1 MG/5ML solution Take by mouth as  needed for diarrhea or loose stools.    . Multiple Vitamin (MULTIVITAMIN WITH MINERALS) TABS tablet Take 1 tablet by mouth daily.    Marland Kitchen omeprazole (PRILOSEC) 20 MG capsule Take 20 mg by mouth daily.    . rosuvastatin (CRESTOR) 10 MG tablet Take 1 tablet (10 mg total) by mouth daily. 90 tablet 3  . simethicone (MYLICON) 80 MG chewable tablet Chew 80 mg by mouth every 6 (six) hours as needed for flatulence.    Marland Kitchen VIAGRA 50 MG tablet Take 50 mg by mouth as needed for erectile dysfunction.   1  . Vit B6-Vit B12-Omega 3 Acids (VITAMIN B PLUS+ PO) Take by mouth.      Musculoskeletal: Strength & Muscle Tone: within normal limits Gait & Station: normal Patient leans: N/A  Psychiatric Specialty Exam: Physical Exam  Nursing note and vitals reviewed. Constitutional: He is oriented to person, place, and time. He appears well-developed and well-nourished.  HENT:  Head: Normocephalic and atraumatic.  Eyes: Pupils are equal, round, and  reactive to light. Conjunctivae are normal.  Neck: Normal range of motion. Neck supple.  Cardiovascular: Normal rate and regular rhythm.  Respiratory: Effort normal and breath sounds normal.  Musculoskeletal: Normal range of motion.  Neurological: He is alert and oriented to person, place, and time.  Skin: Skin is warm and dry.  Psychiatric: Judgment and thought content normal.    Review of Systems  Psychiatric/Behavioral: Positive for depression and suicidal ideas. The patient is nervous/anxious.   All other systems reviewed and are negative.   Blood pressure (!) 138/91, pulse 69, temperature 97.9 F (36.6 C), temperature source Oral, resp. rate 18, height 5\' 7"  (1.702 m), weight 64.4 kg, SpO2 97 %.Body mass index is 22.24 kg/m.  General Appearance: Fairly Groomed  Eye Contact:  Good  Speech:  Clear and Coherent  Volume:  Normal  Mood:  Depressed  Affect:  Depressed  Thought Process:  Coherent  Orientation:  Full (Time, Place, and Person)  Thought Content:   Logical  Suicidal Thoughts:  Yes.  with intent/plan  Homicidal Thoughts:  No  Memory:  Immediate;   Good Recent;   Good  Judgement:  Good  Insight:  Lacking  Psychomotor Activity:  Normal  Concentration:  Concentration: Good and Attention Span: Good  Recall:  Good  Fund of Knowledge:  Good  Language:  Good  Akathisia:  NA  Handed:  Right  AIMS (if indicated):     Assets:  Desire for Improvement Physical Health  ADL's:  Intact  Cognition:  WNL  Sleep:   Well     Treatment Plan Summary: Daily contact with patient to assess and evaluate symptoms and progress in treatment, Medication management and Plan The patient currently does meet criteria for psychiatric inpatient admission due to him actively voicing suicidal ideation  Disposition: Recommend psychiatric Inpatient admission when medically cleared. Supportive therapy provided about ongoing stressors. The patient currently does meet criteria for inpatient psychiatric admission due to him actively practicing suicidal ideation with a plan  Lamont Dowdy, NP 11/18/2018 8:53 AM

## 2018-11-18 NOTE — Progress Notes (Signed)
Patient ID: William Coleman., male   DOB: 12-Nov-1960, 58 y.o.   MRN: [id number]  Admission Note  Patient admitted to the unit in no acute distress. Patient reports he is here for suicidal thoughts after a breakup with his girlfriend. Patient contracts for safety at this time and was cooperative with admission process.  Skin assessment was completed and unremarkable. Patient belongings searched with no contraband found. Belongings secured in locker.   A) Plan of care, unit policies and patient expectations were explained. Written consents obtained. Patient oriented to the unit and their room. Patient placed on standard q15 safety checks. Low fall risk precautions initiated and reviewed with patient.   R) Patient is in no acute distress and verbalizes understanding of information provided. Patient with no concerns at this time.

## 2018-11-18 NOTE — Progress Notes (Addendum)
D  Pt irritable and continues to obsess over his medications and the times he takes them   He is visible on the milieu but has limited interaction A   Verbal support given   Medications administered and effectiveness monitored   Q 15 min checks R    Pt remains safe at this time  [place] NOVEL CORONAVIRUS (COVID-19) DAILY CHECK-OFF SYMPTOMS - answer yes or no to each - every day NO YES  Have you had a fever in the past 24 hours?  . Fever (Temp > 37.80C / 100F) X   Have you had any of these symptoms in the past 24 hours? . New Cough .  Sore Throat  .  Shortness of Breath .  Difficulty Breathing .  Unexplained Body Aches   X   Have you had any one of these symptoms in the past 24 hours not related to allergies?   . Runny Nose .  Nasal Congestion .  Sneezing   X   If you have had runny nose, nasal congestion, sneezing in the past 24 hours, has it worsened?  X   EXPOSURES - check yes or no X   Have you traveled outside the state in the past 14 days?  X   Have you been in contact with someone with a confirmed diagnosis of COVID-19 or PUI in the past 14 days without wearing appropriate PPE?  X   Have you been living in the same home as a person with confirmed diagnosis of COVID-19 or a PUI (household contact)?    X   Have you been diagnosed with COVID-19?    X              What to do next: Answered NO to all: Answered YES to anything:   Proceed with unit schedule Follow the [facility] Inpatient Flowsheet.

## 2018-11-18 NOTE — ED Notes (Signed)

## 2018-11-18 NOTE — Consult Note (Signed)
[facility] Consult   Reason for Consult: Suicidal ideation Referring Physician: Dr. Karma Greaser Patient Identification: William Coleman. MRN:  [id number] Principal Diagnosis: Depression Diagnosis:  Principal Problem:   Depression   Total Time spent with patient: 15 minutes   Patient is seen, chart is reviewed.  Patient continues to endorse depressed mood with suicidal ideation with a plan to end his life.Marland Kitchen  He expresses stressors related to finances along with relationship issues.  Patient continues to meet criteria for involuntary commitment.  COVID-19 swab has been completed for inpatient admission. Orders for admission placed.  From initial psychiatric intake: Subjective: "My health problems and my girlfriend just ended our two years relationship." William Aguas Katherine Syme. is a 58 y.o. male patient presented to [facility] ED via law enforcement under involuntary commitment status (IVC).  The patient states he is extremely depressed and suicidal with a plan to run into traffic and have a car run over him.  This is based on him having multiples physical health problems and a recent break-up with his girlfriend of 2 years.  He states, his relationship was the only happiness in his life and now it is over.  He states there is no point in trying to live for life and leave it like this.  The patient was seen face-to-face by this provider; chart reviewed and consulted with Dr. Karma Greaser  on 11/18/2018 due to the care of the patient. It was discussed with the provider that the patient does meet criteria to be admitted to the inpatient psychiatric unit once a bed becomes available. On evaluation the patient is alert and oriented x4, calm and cooperative, and mood-congruent with affect.  The patient does not appear to be responding to internal or external stimuli. Neither is the patient presenting with any delusional thinking. The patient denies auditory or visual hallucinations. The  patient admits to suicidal and self-harm ideations, but denies homicidal ideation.  The patient is not presenting with any psychotic or paranoid behaviors. During an encounter with the patient, he/she was able to answer questions appropriately. Collateral was not obtained due to the patient stating he has no one that knows what has been going on in his life.  Plan: The patient is a safety risk to self and does require psychiatric inpatient admission for stabilization and treatment.  HPI:  Per Dr. Karma Greaser; William Coleman. is a 58 y.o. male with medical history as listed below who presents for evaluation of depression and suicidal ideation.  He presents under involuntary commitment.  He admits to a psychiatric history that includes depression and prior hospitalization for suicidal ideation.  He says he has been going through a rough time recently including a recent break-up with his girlfriend and he has gradually been getting worse in terms of his depression to where it is now severe and he is having thoughts of suicide.  He is frustrated over his chronic gastrointestinal issues and chronic sinus issues and says that he has been to numerous doctors and multiple medical centers and they have not been able to help him.  He has no new or acute medical complaints at this time, just persistent issues that have been present for years that include occasional nausea, vomiting, abdominal pain, and diarrhea.  He denies fever/chills, sore throat, chest pain, shortness of breath, and dysuria.  He says that he does not take his psychiatric medication because it makes him feel exhausted and he does not like the side effects.  Past  Psychiatric History:  Anxiety/generalized Bipolar disorder ([place]) Depression Obsessive-compulsive disorder  Risk to Self:  Yes Risk to Others:  No Prior Inpatient Therapy:  Yes Prior Outpatient Therapy:  Yes  Past Medical History:  Past Medical History:  Diagnosis Date  .  Anancastic neurosis   . Anginal pain ([place])   . Anxiety, generalized   . Bipolar disorder ([place])   . Chronic diarrhea   . Depression   . Hypertension   . Hypothyroidism   . Obsessive compulsive disorder   . OCD (obsessive compulsive disorder)   . Sleep apnea    patient states it was "marginal", no CPAP    Past Surgical History:  Procedure Laterality Date  . COLONOSCOPY WITH PROPOFOL N/A 02/05/2016   Procedure: COLONOSCOPY WITH PROPOFOL;  Surgeon: [name], MD;  Location: [facility];  Service: Endoscopy;  Laterality: N/A;  . MOUTH SURGERY     orthodontic surgery for underbite  . MYRINGOTOMY WITH TUBE PLACEMENT    . NASAL SEPTUM SURGERY    . POLYPECTOMY N/A 02/05/2016   Procedure: POLYPECTOMY;  Surgeon: [name], MD;  Location: [facility];  Service: Endoscopy;  Laterality: N/A;   Family History:  Family History  Problem Relation Age of Onset  . Depression Mother   . Anxiety disorder Mother   . Congestive Heart Failure Father   . Prostate cancer Father   . Hypertension Father    Family Psychiatric  History:  None known  Social History:  Social History   Substance and Sexual Activity  Alcohol Use Yes   Comment: rarely     Social History   Substance and Sexual Activity  Drug Use No    Social History   Socioeconomic History  . Marital status: Single    Spouse name: Not on file  . Number of children: Not on file  . Years of education: Not on file  . Highest education level: Not on file  Occupational History  . Not on file  Social Needs  . Financial resource strain: Not on file  . Food insecurity:    Worry: Not on file    Inability: Not on file  . Transportation needs:    Medical: Not on file    Non-medical: Not on file  Tobacco Use  . Smoking status: Never Smoker  . Smokeless tobacco: Never Used  Substance and Sexual Activity  . Alcohol use: Yes    Comment: rarely  . Drug use: No  . Sexual activity: Never    Birth  control/protection: Abstinence  Lifestyle  . Physical activity:    Days per week: Not on file    Minutes per session: Not on file  . Stress: Not on file  Relationships  . Social connections:    Talks on phone: Not on file    Gets together: Not on file    Attends religious service: Not on file    Active member of club or organization: Not on file    Attends meetings of clubs or organizations: Not on file    Relationship status: Not on file  Other Topics Concern  . Not on file  Social History Narrative  . Not on file   Additional Social History:    Allergies:   Allergies  Allergen Reactions  . Penicillins Hives and Rash  . Sulfa Antibiotics Hives  . Amoxicillin Hives    Labs:  Results for orders placed or performed during the hospital encounter of 11/17/18 (from the past 48 hour(s))  Comprehensive metabolic panel     Status: Abnormal   Collection Time: 11/17/18  9:38 PM  Result Value Ref Range   Sodium 137 135 - 145 mmol/L   Potassium 3.1 (L) 3.5 - 5.1 mmol/L   Chloride 98 98 - 111 mmol/L   CO2 25 22 - 32 mmol/L   Glucose, Bld 81 70 - 99 mg/dL   BUN 22 (H) 6 - 20 mg/dL   Creatinine, Ser 1.17 0.61 - 1.24 mg/dL   Calcium 9.5 8.9 - 10.3 mg/dL   Total Protein 7.8 6.5 - 8.1 g/dL   Albumin 5.0 3.5 - 5.0 g/dL   AST 28 15 - 41 U/L   ALT 22 0 - 44 U/L   Alkaline Phosphatase 75 38 - 126 U/L   Total Bilirubin 1.3 (H) 0.3 - 1.2 mg/dL   GFR calc non Af Amer >60 >60 mL/min   GFR calc Af Amer >60 >60 mL/min   Anion gap 14 5 - 15    Comment: Performed at [facility], [address], [place], [place] [address]  Ethanol     Status: None   Collection Time: 11/17/18  9:38 PM  Result Value Ref Range   Alcohol, Ethyl (B) <10 <10 mg/dL    Comment: (NOTE) Lowest detectable limit for serum alcohol is 10 mg/dL. For medical purposes only. Performed at [facility], [address] [place], [place] [address]   Salicylate level     Status: None   Collection Time:  11/17/18  9:38 PM  Result Value Ref Range   Salicylate Lvl <5.8 2.8 - 30.0 mg/dL    Comment: Performed at [facility], [address] [place], [place] [address]  Acetaminophen level     Status: Abnormal   Collection Time: 11/17/18  9:38 PM  Result Value Ref Range   Acetaminophen (Tylenol), Serum <10 (L) 10 - 30 ug/mL    Comment: (NOTE) Therapeutic concentrations vary significantly. A range of 10-30 ug/mL  may be an effective concentration for many patients. However, some  are best treated at concentrations outside of this range. Acetaminophen concentrations >150 ug/mL at 4 hours after ingestion  and >50 ug/mL at 12 hours after ingestion are often associated with  toxic reactions. Performed at [facility], [address] [place], [place] [address]   cbc     Status: None   Collection Time: 11/17/18  9:38 PM  Result Value Ref Range   WBC 9.2 4.0 - 10.5 K/uL   RBC 5.09 4.22 - 5.81 MIL/uL   Hemoglobin 15.7 13.0 - 17.0 g/dL   HCT 43.8 39.0 - 52.0 %   MCV 86.1 80.0 - 100.0 fL   MCH 30.8 26.0 - 34.0 pg   MCHC 35.8 30.0 - 36.0 g/dL   RDW 12.4 11.5 - 15.5 %   Platelets 280 150 - 400 K/uL   nRBC 0.0 0.0 - 0.2 %    Comment: Performed at [facility], [address] [place], [place] [address]  Urine Drug Screen, Qualitative     Status: None   Collection Time: 11/17/18  9:38 PM  Result Value Ref Range   Tricyclic, Ur Screen NONE DETECTED NONE DETECTED   Amphetamines, Ur Screen NONE DETECTED NONE DETECTED   MDMA (Ecstasy)Ur Screen NONE DETECTED NONE DETECTED   Cocaine Metabolite,Ur [place] NONE DETECTED NONE DETECTED   Opiate, Ur Screen NONE DETECTED NONE DETECTED   Phencyclidine (PCP) Ur S NONE DETECTED NONE DETECTED   Cannabinoid 50 Ng,  Ur [place] NONE DETECTED NONE DETECTED   Barbiturates, Ur Screen NONE DETECTED NONE DETECTED   Benzodiazepine, Ur Scrn NONE DETECTED NONE DETECTED   Methadone Scn, Ur NONE DETECTED NONE DETECTED    Comment: (NOTE) Tricyclics +  metabolites, urine    Cutoff 1000 ng/mL Amphetamines + metabolites, urine  Cutoff 1000 ng/mL MDMA (Ecstasy), urine              Cutoff 500 ng/mL Cocaine Metabolite, urine          Cutoff 300 ng/mL Opiate + metabolites, urine        Cutoff 300 ng/mL Phencyclidine (PCP), urine         Cutoff 25 ng/mL Cannabinoid, urine                 Cutoff 50 ng/mL Barbiturates + metabolites, urine  Cutoff 200 ng/mL Benzodiazepine, urine              Cutoff 200 ng/mL Methadone, urine                   Cutoff 300 ng/mL The urine drug screen provides only a preliminary, unconfirmed analytical test result and should not be used for non-medical purposes. Clinical consideration and professional judgment should be applied to any positive drug screen result due to possible interfering substances. A more specific alternate chemical method must be used in order to obtain a confirmed analytical result. Gas chromatography / mass spectrometry (GC/MS) is the preferred confirmat ory method. Performed at [facility], [address] [place], [place] [address]     No current facility-administered medications for this encounter.    Current Outpatient Medications  Medication Sig Dispense Refill  . amLODipine (NORVASC) 5 MG tablet Take 1 tablet (5 mg total) by mouth daily. 90 tablet 3  . Ascorbic Acid (VITAMIN C) 100 MG tablet Take 100 mg by mouth daily.    . diphenoxylate-atropine (LOMOTIL) 2.5-0.025 MG tablet Take 2 tablets by mouth 4 (four) times daily as needed for diarrhea or loose stools. 240 tablet 5  . Eluxadoline (VIBERZI) 100 MG TABS Take 1 tablet (100 mg total) by mouth 2 (two) times daily. 60 tablet 5  . fluticasone (FLONASE) 50 MCG/ACT nasal spray 2 sprays by Each Nare route two (2) times a day.    . hyoscyamine (ANASPAZ) 0.125 MG TBDP disintergrating tablet Place 1 tablet (0.125 mg total) under the tongue every 4 (four) hours as needed. 180 tablet 1  . Multiple Vitamin (MULTIVITAMIN WITH  MINERALS) TABS tablet Take 1 tablet by mouth daily.    Marland Kitchen omeprazole (PRILOSEC) 20 MG capsule Take 20 mg by mouth daily.    . rosuvastatin (CRESTOR) 10 MG tablet Take 1 tablet (10 mg total) by mouth daily. 90 tablet 3    Musculoskeletal: Strength & Muscle Tone: within normal limits Gait & Station: normal Patient leans: N/A  Psychiatric Specialty Exam: Physical Exam  Nursing note and vitals reviewed. Constitutional: He is oriented to person, place, and time. He appears well-developed and well-nourished.  HENT:  Head: Normocephalic and atraumatic.  Eyes: Pupils are equal, round, and reactive to light. Conjunctivae are normal.  Neck: Normal range of motion. Neck supple.  Cardiovascular: Normal rate and regular rhythm.  Respiratory: Effort normal and breath sounds normal.  Musculoskeletal: Normal range of motion.  Neurological: He is alert and oriented to person, place, and time.  Skin: Skin is warm and dry.  Psychiatric: Judgment and thought content normal.    Review of Systems  Psychiatric/Behavioral: Positive for depression and suicidal ideas. The patient is nervous/anxious.   All other systems reviewed and are negative.   Blood pressure 125/90, pulse 72, temperature 98.1 F (36.7 C), resp. rate 17, height 5\' 7"  (1.702 m), weight 64.4 kg, SpO2 98 %.Body mass index is 22.24 kg/m.  General Appearance: Fairly Groomed  Eye Contact:  Good  Speech:  Clear and Coherent  Volume:  Normal  Mood:  Depressed  Affect:  Depressed  Thought Process:  Coherent  Orientation:  Full (Time, Place, and Person)  Thought Content:  Logical  Suicidal Thoughts:  Yes.  with intent/plan  Homicidal Thoughts:  No  Memory:  Immediate;   Good Recent;   Good  Judgement:  Good  Insight:  Lacking  Psychomotor Activity:  Normal  Concentration:  Concentration: Good and Attention Span: Good  Recall:  Good  Fund of Knowledge:  Good  Language:  Good  Akathisia:  NA  Handed:  Right  AIMS (if indicated):      Assets:  Desire for Improvement Physical Health  ADL's:  Intact  Cognition:  WNL  Sleep:   Well     Treatment Plan Summary: Daily contact with patient to assess and evaluate symptoms and progress in treatment, Medication management and Plan The patient currently does meet criteria for psychiatric inpatient admission due to him actively voicing suicidal ideation medication management deferred to inpatient psychiatry team. . Disposition: Recommend psychiatric Inpatient admission when medically cleared. Supportive therapy provided about ongoing stressors. The patient currently does meet criteria for inpatient psychiatric admission due to him actively practicing suicidal ideation with a plan  Lavella Hammock, MD 11/18/2018 2:58 PM

## 2018-11-18 NOTE — ED Notes (Signed)
Patient observed lying in bed with eyes closed  Even, unlabored respirations observed   NAD pt appears to be sleeping  I will continue to monitor along with every 15 minute visual observations and ongoing security camera monitoring    

## 2018-11-18 NOTE — ED Notes (Signed)
ED  Is the patient under IVC or is there intent for IVC: Yes.   Is the patient medically cleared: Yes.   Is there vacancy in the ED BHU: Yes.   Is the population mix appropriate for patient: Yes.   Is the patient awaiting placement in inpatient or outpatient setting:   Has the patient had a psychiatric consult:  Consult pending   Survey of unit performed for contraband, proper placement and condition of furniture, tampering with fixtures in bathroom, shower, and each patient room: Yes.  ; Findings:  APPEARANCE/BEHAVIOR Calm and cooperative NEURO ASSESSMENT Orientation: oriented x3  Denies pain Hallucinations: No.None noted (Hallucinations)  Denies at present  Speech: Normal Gait: normal RESPIRATORY ASSESSMENT Even  Unlabored respirations  CARDIOVASCULAR ASSESSMENT Pulses equal   regular rate  Skin warm and dry   GASTROINTESTINAL ASSESSMENT no GI complaint EXTREMITIES Full ROM  PLAN OF CARE Provide calm/safe environment. Vital signs assessed twice daily. ED BHU Assessment once each 12-hour shift. Collaborate with TTS if available or as condition indicates. Assure the ED provider has rounded once each shift. Provide and encourage hygiene. Provide redirection as needed. Assess for escalating behavior; address immediately and inform ED provider.  Assess family dynamic and appropriateness for visitation as needed: Yes.  ; If necessary, describe findings:  Educate the patient/family about BHU procedures/visitation: Yes.  ; If necessary, describe findings:

## 2018-11-18 NOTE — BH Assessment (Addendum)
Patient has been accepted to [facility].  Patient assigned to room 407-1 Accepting physician is Dr. Parke Poisson.  Call report to [phone].  Representative was [facility].   ER Staff is aware of it:  Glenda, ER Secretary   Dr. Kerman Passey, ER MD  Amy T., Patient's Nurse   Call report before patient is picked up from ED per [facility].

## 2018-11-18 NOTE — ED Notes (Signed)
EMTALA reviewed by charge RN 

## 2018-11-18 NOTE — Tx Team (Signed)
Initial Treatment Plan 11/18/2018 7:05 PM William Coleman. EEF:[id number]    PATIENT STRESSORS: Loss of relationship   PATIENT STRENGTHS: Average or above average intelligence Physical Health   PATIENT IDENTIFIED PROBLEMS: Ineffective Coping  Suicide Risk  "coping skills"                 DISCHARGE CRITERIA:  Ability to meet basic life and health needs Adequate post-discharge living arrangements Improved stabilization in mood, thinking, and/or behavior Medical problems require only outpatient monitoring Motivation to continue treatment in a less acute level of care Need for constant or close observation no longer present Reduction of life-threatening or endangering symptoms to within safe limits Safe-care adequate arrangements made Verbal commitment to aftercare and medication compliance  PRELIMINARY DISCHARGE PLAN: Outpatient therapy  PATIENT/FAMILY INVOLVEMENT: This treatment plan has been presented to and reviewed with the patient.  The patient and family have been given the opportunity to ask questions and make suggestions.  Annia Friendly, RN 11/18/2018, 7:05 PM

## 2018-11-19 DIAGNOSIS — F332 Major depressive disorder, recurrent severe without psychotic features: Secondary | ICD-10-CM

## 2018-11-19 LAB — NOVEL CORONAVIRUS, NAA (HOSP ORDER, SEND-OUT TO REF LAB; TAT 18-24 HRS): SARS-CoV-2, NAA: NOT DETECTED

## 2018-11-19 MED ORDER — LORAZEPAM 0.5 MG PO TABS
0.5000 mg | ORAL_TABLET | Freq: Four times a day (QID) | ORAL | Status: DC | PRN
  Administered 2018-11-21 – 2018-11-24 (×4): 0.5 mg via ORAL
  Filled 2018-11-19 (×3): qty 1

## 2018-11-19 MED ORDER — DULOXETINE HCL 30 MG PO CPEP
30.0000 mg | ORAL_CAPSULE | Freq: Every day | ORAL | Status: DC
  Administered 2018-11-20 – 2018-11-22 (×3): 30 mg via ORAL
  Filled 2018-11-19 (×5): qty 1

## 2018-11-19 MED ORDER — DIPHENOXYLATE-ATROPINE 2.5-0.025 MG PO TABS: 2.0000 | ORAL_TABLET | Freq: Three times a day (TID) | ORAL | Status: DC | PRN

## 2018-11-19 MED ORDER — HYOSCYAMINE SULFATE 0.125 MG PO TBDP
0.1250 mg | ORAL_TABLET | Freq: Four times a day (QID) | ORAL | Status: DC | PRN
  Filled 2018-11-19: qty 1

## 2018-11-19 NOTE — BHH Counselor (Signed)
Adult Comprehensive Assessment  Patient ID: Isrrael Coleman., male   DOB: [birth year], 58 y.o.   MRN: [id number]  Information Source: Information source: Patient  Current Stressors:  Patient states their primary concerns and needs for treatment are:: need help with chronic medical issues Patient states their goals for this hospitilization and ongoing recovery are:: "find happiness"  Physical Health: Pt continues to have chronic GI and sinus issues that impact his quality of life.   Social Relationships: pt's girlfriend of nearly two years recently broke up with him and pt does not think he will find another person to be with.  Pt very worried about being alone long term.  Employment: pt lost job at [employer] as part of corona virus pandemic.  Pt does have finances to fall back on currently.  Living/Environment/Situation:  Living Arrangements: Alone Living conditions (as described by patient or guardian): Goes OK How long has patient lived in current situation?: 2 years:  apartment What is atmosphere in current home: Comfortable  Family History:  Marital status: Single, recent break up.  Are you sexually active?: No What is your sexual orientation?: Heterosexual  Has your sexual activity been affected by drugs, alcohol, medication, or emotional stress?: No Does patient have children?: No  Childhood History:  By whom was/is the patient raised?: Both parents Additional childhood history information: Good childhood, positive experiences in school.   Description of patient's relationship with caregiver when they were a child: Good.  a little bumpy at times, but good. Patient's description of current relationship with people who raised him/her: mother died 57, father died 09-15-2014 How were you disciplined when you got in trouble as a child/adolescent?: Mostly verbal Does patient have siblings?: Yes Number of Siblings: 1 Description of patient's current relationship with  siblings: sister.  Pretty good relationship, she is in [place]. Did patient suffer any verbal/emotional/physical/sexual abuse as a child?: No Did patient suffer from severe childhood neglect?: No Has patient ever been sexually abused/assaulted/raped as an adolescent or adult?: No Was the patient ever a victim of a crime or a disaster?: No Witnessed domestic violence?: No Has patient been effected by domestic violence as an adult?: No 11/19/18: no changes to trauma history.   Education:  Highest grade of school patient has completed: 2 years of college Currently a student?: No Learning disability?: No  Employment/Work Situation:   Employment situation: unemployed Where is patient currently employed?: Lost job at [employer] due to [employer] virus Patient's job has been impacted by current illness: What is the longest time patient has a held a job?: 4-5 years  Where was the patient employed at that time?: [employer]  Has patient ever been in the [employer]?: No Are There Guns or Other Weapons in [place]?: No guns reported.   Financial Resources:   Financial resources: unemployment benefits, some investments and some IRA income from parents  Alcohol/Substance Abuse:   What has been your use of drugs/alcohol within the last 12 months?: Alcohol: denies. Drugs: denies If attempted suicide, did drugs/alcohol play a role in this?: No Alcohol/Substance Abuse Treatment Hx: Denies past history Has alcohol/substance abuse ever caused legal problems?: No  Social Support System:   Patient's Community Support System: [employer] System: sister, brother in law Type of faith/religion: no How does patient's faith help to cope with current illness?: na  Leisure/Recreation:   Leisure and Hobbies: movies, walks, online  Strengths/Needs:   What things does the patient do well?: honesty, sincerity  How can pt use strengths to assist in his recovery: "I don't  know" Patient states these barriers may affect their treatment: none reported.   Patient states these barriers may affect there return to the community: none reported Other important information: none  Discharge Plan:   Current mental health provider: [facility] Patient states concerns and preferences for aftercare planning are: continue at [facility]. Patient states they will know when they are safe and ready for discharge when: "I don't know" Does patient have access to transportation?: Yes (own car) Does patient have financial barriers related to discharge medications?: No Will patient be returning to same living situation after discharge? yes   Summary/Recommendations:   Summary and Recommendations (to be completed by the evaluator): Pt is 58 year old male from [place]. William Coleman)  Pt is diagnosed with major depressive disorder and was admitted due to increased depression and suicidal ideation after a relationship break up.  Recommendations for pt include crisis stabilizaiton, therapeutic milieu, attend and participate in groups, medication management, and development of comprehensive mental wellness plan.  Joanne Chars. 11/19/2018

## 2018-11-19 NOTE — [facility] Suicide Risk Assessment (Signed)
[facility] Admission Suicide Risk Assessment   Nursing information obtained from:  Patient, Review of record Demographic factors:  Male, Caucasian, Living alone Current Mental Status:  Suicidal ideation indicated by patient Loss Factors:  Loss of significant relationship Historical Factors:  Prior suicide attempts Risk Reduction Factors:  Positive social support, Positive therapeutic relationship  Total Time spent with patient: 45 minutes Principal Problem: Depression Diagnosis: MDD Subjective Data:   Continued Clinical Symptoms:  Alcohol Use Disorder Identification Test Final Score (AUDIT): 0 The "Alcohol Use Disorders Identification Test", Guidelines for Use in Primary Care, Second Edition.  World Pharmacologist [facility]). Score between 0-7:  no or low risk or alcohol related problems. Score between 8-15:  moderate risk of alcohol related problems. Score between 16-19:  high risk of alcohol related problems. Score 20 or above:  warrants further diagnostic evaluation for alcohol dependence and treatment.   CLINICAL FACTORS:  57, single, no children, lives alone, currently unemployed . Patient presented to ED under IVC due to depression and suicidal ideations. He reports chronic depression which has been worsening recently , with recent suicidal ideations of getting hit by traffic, neuro-vegetative symptoms ( mainly endorses decreased energy, anhedonia) . Denies psychotic symptoms. Attributes partly to relationship stressors/recent break up, unemployment,  and also to chronic medical issues ( chronic sinus symptoms and GI symptoms- reports history of IBS).  He reports he has not been on psychiatric medications for close to two years . Psychiatric history - reports prior psychiatric admission, most recently in 2018 at [facility]. At the time was admitted for similar symptoms as above, diagnosed with MDD, discharged on reports he has been diagnosed with OCD and Depression in the past . At the time was  discharged on Nortriptyline, but states it caused him to feel excessively sedated . States he has tried Prozac, Zoloft, Paxil, Wellbutrin and also TMCs in the past . States Prozac worked better than others but tended to cause diarrhea.   Denies alcohol or drug abuse history . Medical History- reports history of IBS , GERD/Barrett's esophagus, and reports history of chronic sinus symptoms , states he has been told he has chronic rhinitis/sinusitis .  Family history - mother had history of anxiety.  Dx- MDD, OCD by history   Plan- Inpatient treatment. We discussed antidepressant options - patient agrees to Cymbalta trial. Side effects discussed . Start Cymbalta 30 mgrs QDAY. Ativan PRNs for anxiety as needed, Protonix for GERD, Eluxadoline for IBS history, Crestor for hypercholesterolemia, Norvasc for HTN.        Musculoskeletal: Strength & Muscle Tone: within normal limits Gait & Station: normal Patient leans: N/A  Psychiatric Specialty Exam: Physical Exam  ROS no headache, reports chronic sinus symptoms, which he describes mainly as congestion and some post nasal drip. No chest pain, no shortness of breath, reports intermittent reflux symptoms. No fever or chills  Blood pressure (!) 127/92, pulse 77, temperature 98.2 F (36.8 C), temperature source Oral, resp. rate 18, height 5\' 7"  (1.702 m), weight 64.4 kg, SpO2 100 %.Body mass index is 22.24 kg/m.  General Appearance: Fairly Groomed  Eye Contact:  Fair  Speech:  Normal Rate  Volume:  Normal  Mood:  Anxious and Depressed  Affect:  Congruent  Thought Process:  Linear and Descriptions of Associations: Intact  Orientation:  Other:  fully alert and attentive   Thought Content:  no hallucinations, no delusions, somatic concerns as above   Suicidal Thoughts:  No denies current suicidal ideations, no self injurious ideations, denies  violent or homicidal ideations, contracts for safety on unit.  Homicidal Thoughts:  No  Memory:   recent and remote grossly intact   Judgement:  Fair  Insight:  Fair  Psychomotor Activity:  Normal  Concentration:  Concentration: Good and Attention Span: Good  Recall:  Good  Fund of Knowledge:  Good  Language:  Good  Akathisia:  Negative  Handed:  Right  AIMS (if indicated):     Assets:  Desire for Improvement Resilience  ADL's:  Intact  Cognition:  WNL  Sleep:  Number of Hours: 5.5      COGNITIVE FEATURES THAT CONTRIBUTE TO RISK:  Closed-mindedness and Loss of executive function    SUICIDE RISK:   Moderate:  Frequent suicidal ideation with limited intensity, and duration, some specificity in terms of plans, no associated intent, good self-control, limited dysphoria/symptomatology, some risk factors present, and identifiable protective factors, including available and accessible social support.  PLAN OF CARE: Patient will be admitted to inpatient psychiatric unit for stabilization and safety. Will provide and encourage milieu participation. Provide medication management and maked adjustments as needed.  Will follow daily.    I certify that inpatient services furnished can reasonably be expected to improve the patient's condition.   Jenne Campus, MD 11/19/2018, 3:27 PM

## 2018-11-19 NOTE — H&P (Addendum)
Psychiatric Admission Assessment Adult  Patient Identification: William Coleman. MRN:  [id number] Date of Evaluation:  11/19/2018 Chief Complaint:  "I have bowel and sinus problems." Principal Diagnosis: MDD (major depressive disorder), recurrent severe, without psychosis ([place]) Diagnosis:  Principal Problem:   MDD (major depressive disorder), recurrent severe, without psychosis ([place]) Active Problems:   OCD (obsessive compulsive disorder)  History of Present Illness: William Coleman is a 58 year old male with history of IBS, GERD, chronic sinusitis, HTN, MDD, and OCD, presenting for treatment of depression with suicidal ideation to walk into traffic. 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 He also reports increasingly severe depression over the last two weeks related to relationship problems with his girlfriend of two years. She broke up with him related to stress from both of their health problems. He reports history of "bowel and sinus problems" and reports history of extensive testing and scans for both of these problems. He displays obsessive thinking regarding bowels and sinuses on assessment and reports compulsive frequent trips to the bathroom as well as checking locks and door knobs. He reports depression/anxiety have peaked since breakup. He has not taken any psychiatric medications in two years. He reports previous trials of Lexapro, Anafranil, Pamelor, Zoloft, Paxil, Wellbutrin, Prozac, Trintellix, and Abilify, with GI side effects to all these medications. Denies drug or alcohol abuse. UDS negative. BAL <10. Denies psychotic symptoms. Denies SI/HI/AVH.  Associated Signs/Symptoms: Depression Symptoms:  depressed mood, anhedonia, fatigue, suicidal thoughts with specific plan, anxiety, (Hypo) Manic Symptoms:  denies Anxiety Symptoms:  Excessive Worry, Obsessive Compulsive Symptoms:   Checking,, Psychotic Symptoms:  denies PTSD  Symptoms: Negative Total Time spent with patient: 45 minutes  Past Psychiatric History: History of MDD and OCD with multiple medication trials. Previously admitted to [facility] and discharged on Pamelor. Previous treatment with [facility] but reports this was unhelpful.  Is the patient at risk to self? Yes.    Has the patient been a risk to self in the past 6 months? No.  Has the patient been a risk to self within the distant past? Yes.    Is the patient a risk to others? No.  Has the patient been a risk to others in the past 6 months? No.  Has the patient been a risk to others within the distant past? No.   Prior Inpatient Therapy: Prior Inpatient Therapy: [facility]) Prior Outpatient Therapy: Prior Outpatient Therapy: [facility])  Alcohol Screening: 1. How often do you have a drink containing alcohol?: Never 2. How many drinks containing alcohol do you have on a typical day when you are drinking?: 1 or 2 3. How often do you have six or more drinks on one occasion?: Never AUDIT-C Score: 0 9. Have you or someone else been injured as a result of your drinking?: No 10. Has a relative or friend or a doctor or another health worker been concerned about your drinking or suggested you cut down?: No Alcohol Use Disorder Identification Test Final Score (AUDIT): 0 Alcohol Brief Interventions/Follow-up: AUDIT Score <7 follow-up not indicated Substance Abuse History in the last 12 months:  No. Consequences of Substance Abuse: NA Previous Psychotropic Medications: Yes  Psychological Evaluations: No  Past Medical History:  Past Medical History:  Diagnosis Date  . Anancastic neurosis   . Anginal pain ([place])   . Anxiety, generalized   . Bipolar disorder ([place])   . Chronic diarrhea   . Depression   . Hypertension   . Hypothyroidism   .  Obsessive compulsive disorder   . OCD (obsessive compulsive disorder)   . Sleep apnea    patient states it was "marginal", no CPAP    Past Surgical History:  Procedure Laterality  Date  . COLONOSCOPY WITH PROPOFOL N/A 02/05/2016   Procedure: COLONOSCOPY WITH PROPOFOL;  Surgeon: [name], MD;  Location: [facility];  Service: Endoscopy;  Laterality: N/A;  . MOUTH SURGERY     orthodontic surgery for underbite  . MYRINGOTOMY WITH TUBE PLACEMENT    . NASAL SEPTUM SURGERY    . POLYPECTOMY N/A 02/05/2016   Procedure: POLYPECTOMY;  Surgeon: [name], MD;  Location: [facility];  Service: Endoscopy;  Laterality: N/A;   Family History:  Family History  Problem Relation Age of Onset  . Depression Mother   . Anxiety disorder Mother   . Congestive Heart Failure Father   . Prostate cancer Father   . Hypertension Father    Family Psychiatric  History: Mother with anxiety. Tobacco Screening: Have you used any form of tobacco in the last 30 days? (Cigarettes, Smokeless Tobacco, Cigars, and/or Pipes): No Social History:  Social History   Substance and Sexual Activity  Alcohol Use Yes   Comment: rarely     Social History   Substance and Sexual Activity  Drug Use No    Additional Social History: Marital status: Single    Pain Medications: See PTA Prescriptions: See PTA Over the Counter: See PTA History of alcohol / drug use?: No history of alcohol / drug abuse Longest period of sobriety (when/how long): Denies past and current use Negative Consequences of Use: (Denies past and current) Withdrawal Symptoms: (Pt denied)                    Allergies:   Allergies  Allergen Reactions  . Penicillins Hives and Rash  . Sulfa Antibiotics Hives  . Amoxicillin Hives   Lab Results:  Results for orders placed or performed during the hospital encounter of 11/17/18 (from the past 48 hour(s))  Comprehensive metabolic panel     Status: Abnormal   Collection Time: 11/17/18  9:38 PM  Result Value Ref Range   Sodium 137 135 - 145 mmol/L   Potassium 3.1 (L) 3.5 - 5.1 mmol/L   Chloride 98 98 - 111 mmol/L   CO2 25 22 - 32 mmol/L   Glucose, Bld 81  70 - 99 mg/dL   BUN 22 (H) 6 - 20 mg/dL   Creatinine, Ser 1.17 0.61 - 1.24 mg/dL   Calcium 9.5 8.9 - 10.3 mg/dL   Total Protein 7.8 6.5 - 8.1 g/dL   Albumin 5.0 3.5 - 5.0 g/dL   AST 28 15 - 41 U/L   ALT 22 0 - 44 U/L   Alkaline Phosphatase 75 38 - 126 U/L   Total Bilirubin 1.3 (H) 0.3 - 1.2 mg/dL   GFR calc non Af Amer >60 >60 mL/min   GFR calc Af Amer >60 >60 mL/min   Anion gap 14 5 - 15    Comment: Performed at [facility], [address] [place], [place] [address]  Ethanol     Status: None   Collection Time: 11/17/18  9:38 PM  Result Value Ref Range   Alcohol, Ethyl (B) <10 <10 mg/dL    Comment: (NOTE) Lowest detectable limit for serum alcohol is 10 mg/dL. For medical purposes only. Performed at [facility], [address], [place], [place] [address]   Salicylate level  Status: None   Collection Time: 11/17/18  9:38 PM  Result Value Ref Range   Salicylate Lvl <0.2 2.8 - 30.0 mg/dL    Comment: Performed at [facility], [address] [place], [place] [address]  Acetaminophen level     Status: Abnormal   Collection Time: 11/17/18  9:38 PM  Result Value Ref Range   Acetaminophen (Tylenol), Serum <10 (L) 10 - 30 ug/mL    Comment: (NOTE) Therapeutic concentrations vary significantly. A range of 10-30 ug/mL  may be an effective concentration for many patients. However, some  are best treated at concentrations outside of this range. Acetaminophen concentrations >150 ug/mL at 4 hours after ingestion  and >50 ug/mL at 12 hours after ingestion are often associated with  toxic reactions. Performed at [facility], [address] [place], [place] [address]   cbc     Status: None   Collection Time: 11/17/18  9:38 PM  Result Value Ref Range   WBC 9.2 4.0 - 10.5 K/uL   RBC 5.09 4.22 - 5.81 MIL/uL   Hemoglobin 15.7 13.0 - 17.0 g/dL   HCT 43.8 39.0 - 52.0 %   MCV 86.1 80.0 - 100.0 fL   MCH 30.8 26.0 - 34.0 pg   MCHC 35.8 30.0 -  36.0 g/dL   RDW 12.4 11.5 - 15.5 %   Platelets 280 150 - 400 K/uL   nRBC 0.0 0.0 - 0.2 %    Comment: Performed at [facility], [address], [place], [place] [address]  Urine Drug Screen, Qualitative     Status: None   Collection Time: 11/17/18  9:38 PM  Result Value Ref Range   Tricyclic, Ur Screen NONE DETECTED NONE DETECTED   Amphetamines, Ur Screen NONE DETECTED NONE DETECTED   MDMA (Ecstasy)Ur Screen NONE DETECTED NONE DETECTED   Cocaine Metabolite,Ur [place] NONE DETECTED NONE DETECTED   Opiate, Ur Screen NONE DETECTED NONE DETECTED   Phencyclidine (PCP) Ur S NONE DETECTED NONE DETECTED   Cannabinoid 50 Ng, Ur [place] NONE DETECTED NONE DETECTED   Barbiturates, Ur Screen NONE DETECTED NONE DETECTED   Benzodiazepine, Ur Scrn NONE DETECTED NONE DETECTED   Methadone Scn, Ur NONE DETECTED NONE DETECTED    Comment: (NOTE) Tricyclics + metabolites, urine    Cutoff 1000 ng/mL Amphetamines + metabolites, urine  Cutoff 1000 ng/mL MDMA (Ecstasy), urine              Cutoff 500 ng/mL Cocaine Metabolite, urine          Cutoff 300 ng/mL Opiate + metabolites, urine        Cutoff 300 ng/mL Phencyclidine (PCP), urine         Cutoff 25 ng/mL Cannabinoid, urine                 Cutoff 50 ng/mL Barbiturates + metabolites, urine  Cutoff 200 ng/mL Benzodiazepine, urine              Cutoff 200 ng/mL Methadone, urine                   Cutoff 300 ng/mL The urine drug screen provides only a preliminary, unconfirmed analytical test result and should not be used for non-medical purposes. Clinical consideration and professional judgment should be applied to any positive drug screen result due to possible interfering substances. A more specific alternate chemical method must be used in order to obtain a confirmed analytical result. Gas chromatography / mass spectrometry (GC/MS) is the  preferred confirmat ory method. Performed at [facility], [address] [place], [place] [address]    Novel Coronavirus,NAA,(SEND-OUT TO REF LAB - TAT 24-48 hrs); Hosp Order     Status: None   Collection Time: 11/17/18 11:58 PM   Specimen: Nasopharyngeal Swab; Respiratory  Result Value Ref Range   SARS-CoV-2, NAA NOT DETECTED NOT DETECTED    Comment: (NOTE) This test was developed and its performance characteristics determined by [employer]. This test has not been FDA cleared or approved. This test has been authorized by FDA under an Emergency Use Authorization (EUA). This test is only authorized for the duration of time the declaration that circumstances exist justifying the authorization of the emergency use of in vitro diagnostic tests for detection of SARS-CoV-2 virus and/or diagnosis of COVID-19 infection under section 564(b)(1) of the Act, 21 U.S.C. 160VPX-1(G)(6), unless the authorization is terminated or revoked sooner. When diagnostic testing is negative, the possibility of a false negative result should be considered in the context of a patient's recent exposures and the presence of clinical signs and symptoms consistent with COVID-19. An individual without symptoms of COVID-19 and who is not shedding SARS-CoV-2 virus would expect to have a negative (not detected) result in this assay. Performed  At: [facility] [place], [place] [id number] Rush Farmer MD VO:[id number]    Coronavirus Source NASOPHARYNGEAL     Comment: Performed at [facility], [address] [place],  [address]    Blood Alcohol level:  Lab Results  Component Value Date   [facility] <10 11/17/2018   ETH <5 69/67/8938    Metabolic Disorder Labs:  Lab Results  Component Value Date   HGBA1C 5.1 11/14/2015   Lab Results  Component Value Date   PROLACTIN 6.8 11/14/2015   Lab Results  Component Value Date   CHOL 154 06/11/2018   TRIG 84 06/11/2018   HDL 62 06/11/2018   CHOLHDL 2.5 06/11/2018   VLDL 49 (H) 11/14/2015   LDLCALC 75 06/11/2018    LDLCALC 66 07/02/2017    Current Medications: Current Facility-Administered Medications  Medication Dose Route Frequency Provider Last Rate Last Dose  . acetaminophen (TYLENOL) tablet 650 mg  650 mg Oral Q6H PRN Lavella Hammock, MD      . alum & mag hydroxide-simeth (MAALOX/MYLANTA) 200-200-20 MG/5ML suspension 30 mL  30 mL Oral Q4H PRN Lavella Hammock, MD      . amLODipine (NORVASC) tablet 5 mg  5 mg Oral Daily Lavella Hammock, MD   5 mg at 11/19/18 1017  . diphenoxylate-atropine (LOMOTIL) 2.5-0.025 MG per tablet 2 tablet  2 tablet Oral TID PRN , Myer Peer, MD      . Derrill Memo ON 11/20/2018] DULoxetine (CYMBALTA) DR capsule 30 mg  30 mg Oral Daily , Myer Peer, MD      . Eluxadoline TABS 100 mg  100 mg Oral BID Lavella Hammock, MD      . fluticasone Asencion Islam) 50 MCG/ACT nasal spray 1 spray  1 spray Each Nare Daily Lavella Hammock, MD   1 spray at 11/19/18 [phone]  . hyoscyamine (ANASPAZ) disintergrating tablet 0.125 mg  0.125 mg Sublingual Q6H PRN , Myer Peer, MD      . LORazepam (ATIVAN) tablet 0.5 mg  0.5 mg Oral Q6H PRN , Myer Peer, MD      . risperiDONE (RISPERDAL M-TABS) disintegrating tablet 2 mg  2 mg Oral Q8H PRN Lavella Hammock, MD  And  . LORazepam (ATIVAN) tablet 1 mg  1 mg Oral PRN Lavella Hammock, MD       And  . ziprasidone (GEODON) injection 20 mg  20 mg Intramuscular PRN Lavella Hammock, MD      . magnesium hydroxide (MILK OF MAGNESIA) suspension 30 mL  30 mL Oral Daily PRN Lavella Hammock, MD      . multivitamin with minerals tablet 1 tablet  1 tablet Oral Daily Lavella Hammock, MD   1 tablet at 11/19/18 [phone]  . pantoprazole (PROTONIX) EC tablet 40 mg  40 mg Oral Daily Lavella Hammock, MD   40 mg at 11/19/18 2951  . rosuvastatin (CRESTOR) tablet 10 mg  10 mg Oral Daily Lavella Hammock, MD   10 mg at 11/19/18 8841  . traZODone (DESYREL) tablet 50 mg  50 mg Oral QHS PRN Lavella Hammock, MD      . vitamin C (ASCORBIC ACID) tablet 250 mg  250  mg Oral Daily Lavella Hammock, MD   250 mg at 11/19/18 6606   PTA Medications: Medications Prior to Admission  Medication Sig Dispense Refill Last Dose  . amLODipine (NORVASC) 5 MG tablet Take 1 tablet (5 mg total) by mouth daily. 90 tablet 3   . Ascorbic Acid (VITAMIN C) 100 MG tablet Take 100 mg by mouth daily.     . diphenoxylate-atropine (LOMOTIL) 2.5-0.025 MG tablet Take 2 tablets by mouth 4 (four) times daily as needed for diarrhea or loose stools. 240 tablet 5   . Eluxadoline (VIBERZI) 100 MG TABS Take 1 tablet (100 mg total) by mouth 2 (two) times daily. 60 tablet 5   . fluticasone (FLONASE) 50 MCG/ACT nasal spray Place 2 sprays into both nostrils 2 (two) times a day.      . hyoscyamine (ANASPAZ) 0.125 MG TBDP disintergrating tablet Place 1 tablet (0.125 mg total) under the tongue every 4 (four) hours as needed. 180 tablet 1   . Multiple Vitamin (MULTIVITAMIN WITH MINERALS) TABS tablet Take 1 tablet by mouth daily.     Marland Kitchen omeprazole (PRILOSEC) 20 MG capsule Take 20 mg by mouth daily.     . rosuvastatin (CRESTOR) 10 MG tablet Take 1 tablet (10 mg total) by mouth daily. 90 tablet 3     Musculoskeletal: Strength & Muscle Tone: within normal limits Gait & Station: normal Patient leans: N/A  Psychiatric Specialty Exam: Physical Exam  Nursing note and vitals reviewed. Constitutional: He is oriented to person, place, and time. He appears well-developed and well-nourished.  Cardiovascular: Normal rate.  Respiratory: Effort normal.  Neurological: He is alert and oriented to person, place, and time.    Review of Systems  Constitutional: Negative.   Respiratory: Negative for cough and shortness of breath.   Cardiovascular: Negative for chest pain.  Gastrointestinal: Positive for diarrhea (chronic) and heartburn (chronic). Negative for nausea and vomiting.  Neurological: Negative for headaches.  Psychiatric/Behavioral: Positive for depression and suicidal ideas. Negative for  hallucinations and substance abuse. The patient is not nervous/anxious and does not have insomnia.     Blood pressure (!) 127/92, pulse 77, temperature 98.2 F (36.8 C), temperature source Oral, resp. rate 18, height _0  (1.702 m), weight 64.4 kg, SpO2 100 %.Body mass index is 22.24 kg/m.  See MD's admission SRA    Treatment Plan Summary: Daily contact with patient to assess and evaluate symptoms and progress in treatment and Medication management   Inpatient hospitalization.  See MD's admission  SRA for medication management.  Patient will participate in the therapeutic group milieu.  Discharge disposition in progress.   Observation Level/Precautions:  15 minute checks  Laboratory:  BMP, TSH  Psychotherapy:  Group therapy  Medications:  See MAR  Consultations:  PRN  Discharge Concerns:  Safety and stabilization  Estimated LOS: 3-5 days  Other:     Physician Treatment Plan for Primary Diagnosis: MDD (major depressive disorder), recurrent severe, without psychosis ([place]) Long Term Goal(s): Improvement in symptoms so as ready for discharge  Short Term Goals: Ability to identify changes in lifestyle to reduce recurrence of condition will improve, Ability to verbalize feelings will improve and Ability to disclose and discuss suicidal ideas  Physician Treatment Plan for Secondary Diagnosis: Principal Problem:   MDD (major depressive disorder), recurrent severe, without psychosis ([place]) Active Problems:   OCD (obsessive compulsive disorder)  Long Term Goal(s): Improvement in symptoms so as ready for discharge  Short Term Goals: Ability to demonstrate self-control will improve and Ability to identify and develop effective coping behaviors will improve  I certify that inpatient services furnished can reasonably be expected to improve the patient's condition.    Connye Burkitt, NP 6/11/20204:28 PM   I have discussed case with NP and have met with patient  Agree with NP note and  assessment  57, single, no children, lives alone, currently unemployed . Patient presented to ED under IVC due to depression and suicidal ideations. He reports chronic depression which has been worsening recently , with recent suicidal ideations of getting hit by traffic, neuro-vegetative symptoms ( mainly endorses decreased energy, anhedonia) . Denies psychotic symptoms. Attributes partly to relationship stressors/recent break up, unemployment,  and also to chronic medical issues ( chronic sinus symptoms and GI symptoms- reports history of IBS).  He reports he has not been on psychiatric medications for close to two years . Psychiatric history - reports prior psychiatric admission, most recently in 2018 at [facility]. At the time was admitted for similar symptoms as above, diagnosed with MDD, discharged on reports he has been diagnosed with OCD and Depression in the past . At the time was discharged on Nortriptyline, but states it caused him to feel excessively sedated . States he has tried Prozac, Zoloft, Paxil, Wellbutrin and also TMCs in the past . States Prozac worked better than others but tended to cause diarrhea.   Denies alcohol or drug abuse history . Medical History- reports history of IBS , GERD/Barrett's esophagus, and reports history of chronic sinus symptoms , states he has been told he has chronic rhinitis/sinusitis .  Family history - mother had history of anxiety.  Dx- MDD, OCD by history   Plan- Inpatient treatment. We discussed antidepressant options - patient agrees to Cymbalta trial. Side effects discussed . Start Cymbalta 30 mgrs QDAY. Ativan PRNs for anxiety as needed, Protonix for GERD, Eluxadoline for IBS history, Crestor for hypercholesterolemia, Norvasc for HTN.

## 2018-11-19 NOTE — Plan of Care (Signed)
Progress note  Pt found in the dayroom; compliant with medication administration. Pt denies any physical pain or problems. Pt states he has no one that can bring his pt supplied medication. Pt is obsessive over his medications and discussing them brings the pt much stress and anxiety. Pt denies si/hi/ah/vh and verbally agrees to approach staff if these become apparent or before harming himself/others.  A: pt provided support and encouragement. Pt given medication per protocol and standing orders. Q52m safety checks implemented and continued.  R: pt safe on the unit. Will continue to monitor.   Pt progressing the following metrics  Problem: Education: Goal: Knowledge of [place] [facility] Education information/materials will improve Outcome: Progressing Goal: Emotional status will improve Outcome: Progressing Goal: Mental status will improve Outcome: Progressing Goal: Verbalization of understanding the information provided will improve Outcome: Progressing

## 2018-11-20 LAB — BASIC METABOLIC PANEL
Anion gap: 8 (ref 5–15)
BUN: 20 mg/dL (ref 6–20)
CO2: 29 mmol/L (ref 22–32)
Calcium: 9.1 mg/dL (ref 8.9–10.3)
Chloride: 103 mmol/L (ref 98–111)
Creatinine, Ser: 0.83 mg/dL (ref 0.61–1.24)
GFR calc Af Amer: >60 mL/min (ref >60)
GFR calc non Af Amer: >60 mL/min (ref >60)
Glucose, Bld: 96 mg/dL (ref 70–99)
Potassium: 3.3 mmol/L — ABNORMAL LOW (ref 3.5–5.1)
Sodium: 140 mmol/L (ref 135–145)

## 2018-11-20 LAB — TSH: TSH: 0.478 u[IU]/mL (ref 0.350–4.500)

## 2018-11-20 MED ORDER — POTASSIUM CHLORIDE CRYS ER 10 MEQ PO TBCR
10.0000 meq | EXTENDED_RELEASE_TABLET | Freq: Two times a day (BID) | ORAL | Status: AC
  Administered 2018-11-20 – 2018-11-21 (×3): 10 meq via ORAL
  Filled 2018-11-20 (×3): qty 1

## 2018-11-20 NOTE — Progress Notes (Signed)
Wrioter spoke with patient 1:1 and he was preoccupied with his health issues, break up with his girlfriend and not wanting to be alone and how uncomfortable the bed is here. He spoke at length about needing to have someone in his life. He was informed of medications available and requested medication for sleep tonight. Support given and safety maintained on unit with 15 min checks.

## 2018-11-20 NOTE — Progress Notes (Signed)
[place] NOVEL CORONAVIRUS (COVID-19) DAILY CHECK-OFF SYMPTOMS - answer yes or no to each - every day NO YES  Have you had a fever in the past 24 hours?  . Fever (Temp > 37.80C / 100F) X   Have you had any of these symptoms in the past 24 hours? . New Cough .  Sore Throat  .  Shortness of Breath .  Difficulty Breathing .  Unexplained Body Aches   X   Have you had any one of these symptoms in the past 24 hours not related to allergies?   . Runny Nose .  Nasal Congestion .  Sneezing   X   If you have had runny nose, nasal congestion, sneezing in the past 24 hours, has it worsened?  X   EXPOSURES - check yes or no X   Have you traveled outside the state in the past 14 days?  X   Have you been in contact with someone with a confirmed diagnosis of COVID-19 or PUI in the past 14 days without wearing appropriate PPE?  X   Have you been living in the same home as a person with confirmed diagnosis of COVID-19 or a PUI (household contact)?    X   Have you been diagnosed with COVID-19?    X              What to do next: Answered NO to all: Answered YES to anything:   Proceed with unit schedule Follow the [facility] Inpatient Flowsheet.   

## 2018-11-20 NOTE — Progress Notes (Signed)
Patient ID: William Coleman., male   DOB: 1961-04-29, 58 y.o.   MRN: [id number] D: Patient calm and cooperative, visible in the day room and interacting with his peers and staff. Pt denies SI/HI/AVH, and took all meds as scheduled. He reports a good night's sleep, denies any current concerns.  A: Pt being monitored for safety via Q15 minute checks.  R: Will continue to monitor.

## 2018-11-20 NOTE — Progress Notes (Signed)
[facility] MD Progress Note  11/20/2018 11:21 AM William Coleman.  MRN:  [id number] Subjective: Patient reports she did not sleep well last night, which she attributes to uncomfortable bed.  He describes lingering depression, anxiety which she attributes to "loneliness" following recent break-up.  Continues to endorse symptoms such as sinus congestion, but overall seems less somatically focused today.  Does not endorse medication side effects.  Denies suicidal ideations.  Objective: Have discussed case with treatment team and have met with patient.  58 year old single male, presented under IVC due to depression and suicidal ideations, with thoughts of walking into traffic.  Also endorsed neurovegetative symptoms such as low energy and anhedonia.  Attributed depression partly to a recent break-up, unemployment.  Also reported chronic medical issues, particularly chronic sinus symptoms and a history of IBS.  History of prior psychiatric admissions, most recently in 2018.  Has been diagnosed with OCD and depression in the past.  Today patient presents alert, attentive, cooperative, vaguely anxious/ruminative.  Currently denies suicidal ideations, and contracts for safety.  Denies homicidal ideations.  Denies hallucinations and does not appear internally preoccupied.  He continues to ruminate about break-up and describes a sense of "loneliness" following break-up that has contributed to his depression. Continues to express some somatic concerns, reports he worries that Cymbalta may cause changes in bowel habit, but thus far denies side effects.  Visible in dayroom, no agitated or disruptive behaviors, limited interaction with peers.  Labs reviewed-BMP unremarkable-potassium has improved from 3.1 to 3.3.  TSH 0.478 Principal Problem: MDD (major depressive disorder), recurrent severe, without psychosis ([place]) Diagnosis: Principal Problem:   MDD (major depressive disorder), recurrent severe, without  psychosis ([place]) Active Problems:   OCD (obsessive compulsive disorder)  Total Time spent with patient: 15 minutes  Past Psychiatric History:   Past Medical History:  Past Medical History:  Diagnosis Date  . Anancastic neurosis   . Anginal pain ([place])   . Anxiety, generalized   . Bipolar disorder ([place])   . Chronic diarrhea   . Depression   . Hypertension   . Hypothyroidism   . Obsessive compulsive disorder   . OCD (obsessive compulsive disorder)   . Sleep apnea    patient states it was "marginal", no CPAP    Past Surgical History:  Procedure Laterality Date  . COLONOSCOPY WITH PROPOFOL N/A 02/05/2016   Procedure: COLONOSCOPY WITH PROPOFOL;  Surgeon: [name], MD;  Location: [place];  Service: Endoscopy;  Laterality: N/A;  . MOUTH SURGERY     orthodontic surgery for underbite  . MYRINGOTOMY WITH TUBE PLACEMENT    . NASAL SEPTUM SURGERY    . POLYPECTOMY N/A 02/05/2016   Procedure: POLYPECTOMY;  Surgeon: [name], MD;  Location: [place];  Service: Endoscopy;  Laterality: N/A;   Family History:  Family History  Problem Relation Age of Onset  . Depression Mother   . Anxiety disorder Mother   . Congestive Heart Failure Father   . Prostate cancer Father   . Hypertension Father    Family Psychiatric  History:  Social History:  Social History   Substance and Sexual Activity  Alcohol Use Yes   Comment: rarely     Social History   Substance and Sexual Activity  Drug Use No    Social History   Socioeconomic History  . Marital status: Single    Spouse name: Not on file  . Number of children: Not on file  . Years of education: Not on file  .  Highest education level: Not on file  Occupational History  . Not on file  Social Needs  . Financial resource strain: Not on file  . Food insecurity    Worry: Not on file    Inability: Not on file  . Transportation needs    Medical: Not on file    Non-medical: Not on file  Tobacco Use  .  Smoking status: Never Smoker  . Smokeless tobacco: Never Used  Substance and Sexual Activity  . Alcohol use: Yes    Comment: rarely  . Drug use: No  . Sexual activity: Never    Birth control/protection: Abstinence  Lifestyle  . Physical activity    Days per week: Not on file    Minutes per session: Not on file  . Stress: Not on file  Relationships  . Social Herbalist on phone: Not on file    Gets together: Not on file    Attends religious service: Not on file    Active member of club or organization: Not on file    Attends meetings of clubs or organizations: Not on file    Relationship status: Not on file  Other Topics Concern  . Not on file  Social History Narrative  . Not on file   Additional Social History:    Pain Medications: See PTA Prescriptions: See PTA Over the Counter: See PTA History of alcohol / drug use?: No history of alcohol / drug abuse Longest period of sobriety (when/how long): Denies past and current use Negative Consequences of Use: (Denies past and current) Withdrawal Symptoms: (Pt denied)  Sleep: Fair  Appetite:  Good  Current Medications: Current Facility-Administered Medications  Medication Dose Route Frequency Provider Last Rate Last Dose  . acetaminophen (TYLENOL) tablet 650 mg  650 mg Oral Q6H PRN Lavella Hammock, MD      . alum & mag hydroxide-simeth (MAALOX/MYLANTA) 200-200-20 MG/5ML suspension 30 mL  30 mL Oral Q4H PRN Lavella Hammock, MD      . amLODipine (NORVASC) tablet 5 mg  5 mg Oral Daily Lavella Hammock, MD   5 mg at 11/20/18 0915  . diphenoxylate-atropine (LOMOTIL) 2.5-0.025 MG per tablet 2 tablet  2 tablet Oral TID PRN Jood Retana, Myer Peer, MD      . DULoxetine (CYMBALTA) DR capsule 30 mg  30 mg Oral Daily Burnette Valenti, Myer Peer, MD   30 mg at 11/20/18 0915  . Eluxadoline TABS 100 mg  100 mg Oral BID Lavella Hammock, MD      . fluticasone [facility]) 50 MCG/ACT nasal spray 1 spray  1 spray Each Nare Daily Lavella Hammock,  MD   1 spray at 11/19/18 [phone]  . hyoscyamine (ANASPAZ) disintergrating tablet 0.125 mg  0.125 mg Sublingual Q6H PRN Eilyn Polack, Myer Peer, MD      . LORazepam (ATIVAN) tablet 0.5 mg  0.5 mg Oral Q6H PRN Sangeeta Youse, Myer Peer, MD      . risperiDONE (RISPERDAL M-TABS) disintegrating tablet 2 mg  2 mg Oral Q8H PRN Lavella Hammock, MD       And  . LORazepam (ATIVAN) tablet 1 mg  1 mg Oral PRN Lavella Hammock, MD       And  . ziprasidone (GEODON) injection 20 mg  20 mg Intramuscular PRN Lavella Hammock, MD      . magnesium hydroxide (MILK OF MAGNESIA) suspension 30 mL  30 mL Oral Daily PRN Lavella Hammock, MD      .  multivitamin with minerals tablet 1 tablet  1 tablet Oral Daily Lavella Hammock, MD   1 tablet at 11/20/18 0914  . pantoprazole (PROTONIX) EC tablet 40 mg  40 mg Oral Daily Lavella Hammock, MD   40 mg at 11/20/18 0915  . rosuvastatin (CRESTOR) tablet 10 mg  10 mg Oral Daily Lavella Hammock, MD   10 mg at 11/20/18 0914  . traZODone (DESYREL) tablet 50 mg  50 mg Oral QHS PRN Lavella Hammock, MD      . vitamin C (ASCORBIC ACID) tablet 250 mg  250 mg Oral Daily Lavella Hammock, MD   250 mg at 11/20/18 8453    Lab Results:  Results for orders placed or performed during the hospital encounter of 11/18/18 (from the past 48 hour(s))  Basic metabolic panel     Status: Abnormal   Collection Time: 11/20/18  7:01 AM  Result Value Ref Range   Sodium 140 135 - 145 mmol/L   Potassium 3.3 (L) 3.5 - 5.1 mmol/L   Chloride 103 98 - 111 mmol/L   CO2 29 22 - 32 mmol/L   Glucose, Bld 96 70 - 99 mg/dL   BUN 20 6 - 20 mg/dL   Creatinine, Ser 0.83 0.61 - 1.24 mg/dL   Calcium 9.1 8.9 - 10.3 mg/dL   GFR calc non Af Amer >60 >60 mL/min   GFR calc Af Amer >60 >60 mL/min   Anion gap 8 5 - 15    Comment: Performed at [facility], [place] [address], [place], [place] [address]  TSH     Status: None   Collection Time: 11/20/18  7:01 AM  Result Value Ref Range   TSH 0.478 0.350 - 4.500  uIU/mL    Comment: Performed by a 3rd Generation assay with a functional sensitivity of <=0.01 uIU/mL. Performed at [facility], [place] [address] [place], [place] [address]     Blood Alcohol level:  Lab Results  Component Value Date   [facility] <10 11/17/2018   ETH <5 48/25/0037    Metabolic Disorder Labs: Lab Results  Component Value Date   HGBA1C 5.1 11/14/2015   Lab Results  Component Value Date   PROLACTIN 6.8 11/14/2015   Lab Results  Component Value Date   CHOL 154 06/11/2018   TRIG 84 06/11/2018   HDL 62 06/11/2018   CHOLHDL 2.5 06/11/2018   VLDL 49 (H) 11/14/2015   LDLCALC 75 06/11/2018   LDLCALC 66 07/02/2017    Physical Findings: AIMS: Facial and Oral Movements Muscles of Facial Expression: None, normal Lips and Perioral Area: None, normal Jaw: None, normal Tongue: None, normal,Extremity Movements Upper (arms, wrists, hands, fingers): None, normal Lower (legs, knees, ankles, toes): None, normal, Trunk Movements Neck, shoulders, hips: None, normal, Overall Severity Severity of abnormal movements (highest score from questions above): None, normal Incapacitation due to abnormal movements: None, normal Patient's awareness of abnormal movements (rate only patient's report): No Awareness, Dental Status Current problems with teeth and/or dentures?: No Does patient usually wear dentures?: No  CIWA:    COWS:     Musculoskeletal: Strength & Muscle Tone: within normal limits Gait & Station: normal Patient leans: N/A  Psychiatric Specialty Exam: Physical Exam  ROS no chest pain, no shortness of breath, no vomiting, no fever, no chills.  Reports chronic sinus congestion.  Blood pressure (!) 127/92, pulse 77, temperature 98.2 F (36.8 C), temperature source Oral, resp. rate 18, height _0  (1.702 m),  weight 64.4 kg, SpO2 100 %.Body mass index is 22.24 kg/m.  General Appearance: Fairly Groomed  Eye Contact:  Fair, but improving  Speech:  Normal  Rate  Volume:  Normal  Mood:  Depressed, anxious  Affect:  Congruent  Thought Process:  Linear and Descriptions of Associations: Intact  Orientation:  Other:  Fully alert and attentive  Thought Content:  Denies hallucinations, no delusions expressed  Suicidal Thoughts:  No-currently denies suicidal or self-injurious ideations, denies homicidal or violent ideations, contracts for safety on unit  Homicidal Thoughts:  No  Memory:  Recent and remote grossly intact  Judgement:  Fair  Insight:  Fair  Psychomotor Activity:  Normal  Concentration:  Concentration: Good and Attention Span: Good  Recall:  Good  Fund of Knowledge:  Good  Language:  Good  Akathisia:  Negative  Handed:  Right  AIMS (if indicated):     Assets:  Communication Skills Desire for Improvement Resilience  ADL's:  Intact  Cognition:  WNL  Sleep:  Number of Hours: 5.5   Assessment: 58 year old single male, presented under IVC due to depression and suicidal ideations, with thoughts of walking into traffic.  Also endorsed neurovegetative symptoms such as low energy and anhedonia.  Attributed depression partly to a recent break-up, unemployment.  Also reported chronic medical issues, particularly chronic sinus symptoms and a history of IBS.  History of prior psychiatric admissions, most recently in 2018.  Has been diagnosed with OCD and depression in the past.  Patient remains anxious, depressed, endorsing subjective feeling of loneliness following recent break-up.  Denies suicidal ideations today.  Persistent somatic preoccupations although to a lesser degree today.  Does not appear to be in any acute distress or discomfort .  Thus far tolerating medications well ( on Cymbalta trial ) .    Treatment Plan Summary: Daily contact with patient to assess and evaluate symptoms and progress in treatment, Medication management, Plan inpatient treatment and medications as below Encourage group and milieu participation to work on  coping skills and symptom reduction Continue Norvasc 5 mg daily for hypertension Continue Crestor for hyperlipidemia Continue Eluxadoline for history of IBS Continue Hyoscyamine for cramps as needed Continue Lomotil PRN for diarrhea if needed  Continue Protonix 40 mgrs QDAY for GERD, gastric protection Continue Cymbalta 30 mgrs QDAY for depression and anxety Continue Trazodone 50 mgrs QHS PRN for insomnia Treatment team working on disposition planning options  Jenne Campus, MD 11/20/2018, 11:21 AM

## 2018-11-20 NOTE — Tx Team (Signed)
Interdisciplinary Treatment and Diagnostic Plan Update  11/20/2018 Time of Session:  Theodis Aguas [name]. MRN: [id number]  Principal Diagnosis: MDD (major depressive disorder), recurrent severe, without psychosis ([place])  Secondary Diagnoses: Principal Problem:   MDD (major depressive disorder), recurrent severe, without psychosis ([place]) Active Problems:   OCD (obsessive compulsive disorder)   Current Medications:  Current Facility-Administered Medications  Medication Dose Route Frequency Provider Last Rate Last Dose  . acetaminophen (TYLENOL) tablet 650 mg  650 mg Oral Q6H PRN Lavella Hammock, MD      . alum & mag hydroxide-simeth (MAALOX/MYLANTA) 200-200-20 MG/5ML suspension 30 mL  30 mL Oral Q4H PRN Lavella Hammock, MD      . amLODipine (NORVASC) tablet 5 mg  5 mg Oral Daily Lavella Hammock, MD   5 mg at 11/20/18 0915  . diphenoxylate-atropine (LOMOTIL) 2.5-0.025 MG per tablet 2 tablet  2 tablet Oral TID PRN Cobos, Myer Peer, MD      . DULoxetine (CYMBALTA) DR capsule 30 mg  30 mg Oral Daily Cobos, Myer Peer, MD   30 mg at 11/20/18 0915  . Eluxadoline TABS 100 mg  100 mg Oral BID Lavella Hammock, MD      . fluticasone [facility]) 50 MCG/ACT nasal spray 1 spray  1 spray Each Nare Daily Lavella Hammock, MD   1 spray at 11/19/18 [phone]  . hyoscyamine (ANASPAZ) disintergrating tablet 0.125 mg  0.125 mg Sublingual Q6H PRN Cobos, Myer Peer, MD      . LORazepam (ATIVAN) tablet 0.5 mg  0.5 mg Oral Q6H PRN Cobos, Myer Peer, MD      . risperiDONE (RISPERDAL M-TABS) disintegrating tablet 2 mg  2 mg Oral Q8H PRN Lavella Hammock, MD       And  . LORazepam (ATIVAN) tablet 1 mg  1 mg Oral PRN Lavella Hammock, MD       And  . ziprasidone (GEODON) injection 20 mg  20 mg Intramuscular PRN Lavella Hammock, MD      . magnesium hydroxide (MILK OF MAGNESIA) suspension 30 mL  30 mL Oral Daily PRN Lavella Hammock, MD      . multivitamin with minerals tablet 1 tablet  1 tablet Oral Daily Lavella Hammock, MD   1 tablet at 11/20/18 0914  . pantoprazole (PROTONIX) EC tablet 40 mg  40 mg Oral Daily Lavella Hammock, MD   40 mg at 11/20/18 0915  . rosuvastatin (CRESTOR) tablet 10 mg  10 mg Oral Daily Lavella Hammock, MD   10 mg at 11/20/18 0914  . traZODone (DESYREL) tablet 50 mg  50 mg Oral QHS PRN Lavella Hammock, MD      . vitamin C (ASCORBIC ACID) tablet 250 mg  250 mg Oral Daily Lavella Hammock, MD   250 mg at 11/20/18 9211   PTA Medications: Medications Prior to Admission  Medication Sig Dispense Refill Last Dose  . amLODipine (NORVASC) 5 MG tablet Take 1 tablet (5 mg total) by mouth daily. 90 tablet 3   . Ascorbic Acid (VITAMIN C) 100 MG tablet Take 100 mg by mouth daily.     . diphenoxylate-atropine (LOMOTIL) 2.5-0.025 MG tablet Take 2 tablets by mouth 4 (four) times daily as needed for diarrhea or loose stools. 240 tablet 5   . Eluxadoline (VIBERZI) 100 MG TABS Take 1 tablet (100 mg total) by mouth 2 (two) times daily. 60 tablet 5   . fluticasone (FLONASE) 50 MCG/ACT nasal  spray Place 2 sprays into both nostrils 2 (two) times a day.      . hyoscyamine (ANASPAZ) 0.125 MG TBDP disintergrating tablet Place 1 tablet (0.125 mg total) under the tongue every 4 (four) hours as needed. 180 tablet 1   . Multiple Vitamin (MULTIVITAMIN WITH MINERALS) TABS tablet Take 1 tablet by mouth daily.     . omeprazole (PRILOSEC) 20 MG capsule Take 20 mg by mouth daily.     . rosuvastatin (CRESTOR) 10 MG tablet Take 1 tablet (10 mg total) by mouth daily. 90 tablet 3     Patient Stressors: Loss of relationship  Patient Strengths: Average or above average intelligence Physical Health  Treatment Modalities: Medication Management, Group therapy, Case management,  1 to 1 session with clinician, Psychoeducation, Recreational therapy.   Physician Treatment Plan for Primary Diagnosis: MDD (major depressive disorder), recurrent severe, without psychosis ([facility]) Long Term Goal(s): Improvement in symptoms  so as ready for discharge Improvement in symptoms so as ready for discharge   Short Term Goals: Ability to identify changes in lifestyle to reduce recurrence of condition will improve Ability to verbalize feelings will improve Ability to disclose and discuss suicidal ideas Ability to demonstrate self-control will improve Ability to identify and develop effective coping behaviors will improve  Medication Management: Evaluate patient's response, side effects, and tolerance of medication regimen.  Therapeutic Interventions: 1 to 1 sessions, Unit Group sessions and Medication administration.  Evaluation of Outcomes: Not Met  Physician Treatment Plan for Secondary Diagnosis: Principal Problem:   MDD (major depressive disorder), recurrent severe, without psychosis ([facility]) Active Problems:   OCD (obsessive compulsive disorder)  Long Term Goal(s): Improvement in symptoms so as ready for discharge Improvement in symptoms so as ready for discharge   Short Term Goals: Ability to identify changes in lifestyle to reduce recurrence of condition will improve Ability to verbalize feelings will improve Ability to disclose and discuss suicidal ideas Ability to demonstrate self-control will improve Ability to identify and develop effective coping behaviors will improve     Medication Management: Evaluate patient's response, side effects, and tolerance of medication regimen.  Therapeutic Interventions: 1 to 1 sessions, Unit Group sessions and Medication administration.  Evaluation of Outcomes: Not Met   RN Treatment Plan for Primary Diagnosis: MDD (major depressive disorder), recurrent severe, without psychosis ([facility]) Long Term Goal(s): Knowledge of disease and therapeutic regimen to maintain health will improve  Short Term Goals: Ability to participate in decision making will improve, Ability to verbalize feelings will improve, Ability to disclose and discuss suicidal ideas and Ability to identify  and develop effective coping behaviors will improve  Medication Management: RN will administer medications as ordered by provider, will assess and evaluate patient's response and provide education to patient for prescribed medication. RN will report any adverse and/or side effects to prescribing provider.  Therapeutic Interventions: 1 on 1 counseling sessions, Psychoeducation, Medication administration, Evaluate responses to treatment, Monitor vital signs and CBGs as ordered, Perform/monitor CIWA, COWS, AIMS and Fall Risk screenings as ordered, Perform wound care treatments as ordered.  Evaluation of Outcomes: Not Met   LCSW Treatment Plan for Primary Diagnosis: MDD (major depressive disorder), recurrent severe, without psychosis ([facility]) Long Term Goal(s): Safe transition to appropriate next level of care at discharge, Engage patient in therapeutic group addressing interpersonal concerns.  Short Term Goals: Engage patient in aftercare planning with referrals and resources  Therapeutic Interventions: Assess for all discharge needs, 1 to 1 time with Social worker, Explore available resources and support   systems, Assess for adequacy in community support network, Educate family and significant other(s) on suicide prevention, Complete Psychosocial Assessment, Interpersonal group therapy.  Evaluation of Outcomes: Not Met   Progress in Treatment: Attending groups: No. Participating in groups: No. Taking medication as prescribed: Yes. Toleration medication: Yes. Family/Significant other contact made: No, will contact:  the patient's sister Patient understands diagnosis: Yes. Discussing patient identified problems/goals with staff: Yes. Medical problems stabilized or resolved: Yes. Denies suicidal/homicidal ideation: Yes. Issues/concerns per patient self-inventory: No. Other:   New problem(s) identified: None   New Short Term/Long Term Goal(s): medication stabilization, elimination of SI  thoughts, development of comprehensive mental wellness plan.    Patient Goals:    Discharge Plan or Barriers: CSW will continue to follow for appropriate referrals and possible discharge planning.   Reason for Continuation of Hospitalization: Anxiety Depression Medication stabilization Suicidal ideation  Estimated Length of Stay: 3-5 days   Attendees: Patient: 11/20/2018 11:28 AM  Physician: Dr. Neita Garnet, MD 11/20/2018 11:28 AM  Nursing: Tamela Oddi RN 11/20/2018 11:28 AM  RN Care Manager: 11/20/2018 11:28 AM  Social Worker: Radonna Ricker, [place] 11/20/2018 11:28 AM  Recreational Therapist:  11/20/2018 11:28 AM  Other:  11/20/2018 11:28 AM  Other:  11/20/2018 11:28 AM  Other: 11/20/2018 11:28 AM    Scribe for Treatment Team: Marylee Floras, [place] 11/20/2018 11:28 AM

## 2018-11-21 NOTE — Progress Notes (Signed)
Writer spoke with patient 1:1 and he c/o taking a long time to fall asleep on last night and then not wanting to get up this Saturday morning. He continues to c/o the bed sleeping horrible and how he take melatonin at home to help him sleep. Writer asked if he would like to try this for sleep and he declined requesting 100 of trazodone instead. He was informed by Probation officer that the bed situation is not going to change as far as them being uncomfortable. Writer will attempt to receive and order for 100 of trazodone or either a repeat of 50 mg. Support given and safety maintained on unit with 15 min checks.

## 2018-11-21 NOTE — Plan of Care (Signed)
D: Pt alert and oriented on the unit. Pt denies SI/HI, A/VH. Pt was preoccupied with his bedtime medications and the MD and CSW talking with his sister. CSW and MD notified of pt's concerns. RN provided reassurance.   A: Education, support and encouragement provided, q15 minute safety checks remain in effect. Medications administered per MD orders.  R: No reactions/side effects to medicine noted. Pt denies any concerns at this time, and verbally contracts for safety. Pt ambulating on the unit with no issues. Pt remains safe on and off the unit.   Problem: Safety: Goal: Periods of time without injury will increase Outcome: Progressing

## 2018-11-21 NOTE — [facility] Group Notes (Signed)
[facility] Group Notes:  (Nursing)  Date:  11/21/2018  Time:  130 PM Type of Therapy:  Nurse Education  Participation Level:  Active  Participation Quality:  Appropriate  Affect:  Appropriate  Cognitive:  Appropriate  Insight:  Appropriate  Engagement in Group:  Engaged  Modes of Intervention:  Education  Summary of Progress/Problems:  Waymond Cera 11/21/2018, 9:14 PM

## 2018-11-21 NOTE — [facility] Group Notes (Signed)
[facility] Group Notes: (Clinical Social Work)   11/21/2018      Type of Therapy:  Group Therapy   Participation Level:  Did Not Attend - was invited both individually by MHT and by overhead announcement, chose not to attend.   Selmer Dominion, LCSW 11/21/2018, 1:13 PM

## 2018-11-21 NOTE — Progress Notes (Addendum)
[facility] MD Progress Note  11/21/2018 10:57 AM William Coleman.  MRN:  [id number]  Subjective: "I cant get any sleep here because of the bed. My depression is still here because I cant stop thinking about the relationship I just lost."      Does not endorse medication side effects.  Denies suicidal ideations.   Objective: Face to face evaluation completed and chart reviewed. In brief, this is a 58 year old single male, presented under IVC due to depression and suicidal ideations, with thoughts of walking into traffic.    During this evaluation, patient is alert and oriented x3. H endorses on going depression which is mostly attributed to a recent break-up with his girlfriend. He reports feelings of, " loneliness" and hopelessness.  He denies suicidal ideations, and contracts for safety.  Denies homicidal ideations.  He denies AVH and is not internally preoccupied.As per previous reports, patient has ruminate about the break-up with his girlfriend and today, he does the same. He remains somatic in complaints reporting ongoing sinus issues and cough. As per nurse, patient is preoccupied with his health issues, break up with his girlfriend and not wanting to be alone and how uncomfortable the bed is here. He continues to endorse poor sleep (related to bed being uncomfortable per his report). He denies concerns with appetite. He denies medication side effects. There are no reports of  agitated or disruptive behaviors. He continues to have limited interaction with peers and was encouraged to participate in unit milieu.    Principal Problem: MDD (major depressive disorder), recurrent severe, without psychosis ([place]) Diagnosis: Principal Problem:   MDD (major depressive disorder), recurrent severe, without psychosis ([place]) Active Problems:   OCD (obsessive compulsive disorder)  Total Time spent with patient: 15 minutes  Past Psychiatric History:   Past Medical History:  Past Medical History:   Diagnosis Date  . Anancastic neurosis   . Anginal pain ([place])   . Anxiety, generalized   . Bipolar disorder ([place])   . Chronic diarrhea   . Depression   . Hypertension   . Hypothyroidism   . Obsessive compulsive disorder   . OCD (obsessive compulsive disorder)   . Sleep apnea    patient states it was "marginal", no CPAP    Past Surgical History:  Procedure Laterality Date  . COLONOSCOPY WITH PROPOFOL N/A 02/05/2016   Procedure: COLONOSCOPY WITH PROPOFOL;  Surgeon: [name], MD;  Location: [facility];  Service: Endoscopy;  Laterality: N/A;  . MOUTH SURGERY     orthodontic surgery for underbite  . MYRINGOTOMY WITH TUBE PLACEMENT    . NASAL SEPTUM SURGERY    . POLYPECTOMY N/A 02/05/2016   Procedure: POLYPECTOMY;  Surgeon: [name], MD;  Location: [facility];  Service: Endoscopy;  Laterality: N/A;   Family History:  Family History  Problem Relation Age of Onset  . Depression Mother   . Anxiety disorder Mother   . Congestive Heart Failure Father   . Prostate cancer Father   . Hypertension Father    Family Psychiatric  History:  Social History:  Social History   Substance and Sexual Activity  Alcohol Use Yes   Comment: rarely     Social History   Substance and Sexual Activity  Drug Use No    Social History   Socioeconomic History  . Marital status: Single    Spouse name: Not on file  . Number of children: Not on file  . Years of education: Not on file  .  Highest education level: Not on file  Occupational History  . Not on file  Social Needs  . Financial resource strain: Not on file  . Food insecurity    Worry: Not on file    Inability: Not on file  . Transportation needs    Medical: Not on file    Non-medical: Not on file  Tobacco Use  . Smoking status: Never Smoker  . Smokeless tobacco: Never Used  Substance and Sexual Activity  . Alcohol use: Yes    Comment: rarely  . Drug use: No  . Sexual activity: Never    Birth  control/protection: Abstinence  Lifestyle  . Physical activity    Days per week: Not on file    Minutes per session: Not on file  . Stress: Not on file  Relationships  . Social Herbalist on phone: Not on file    Gets together: Not on file    Attends religious service: Not on file    Active member of club or organization: Not on file    Attends meetings of clubs or organizations: Not on file    Relationship status: Not on file  Other Topics Concern  . Not on file  Social History Narrative  . Not on file   Additional Social History:    Pain Medications: See PTA Prescriptions: See PTA Over the Counter: See PTA History of alcohol / drug use?: No history of alcohol / drug abuse Longest period of sobriety (when/how long): Denies past and current use Negative Consequences of Use: (Denies past and current) Withdrawal Symptoms: (Pt denied)  Sleep: Poor-due to bed feeling uncomfortable.   Appetite:  Good  Current Medications: Current Facility-Administered Medications  Medication Dose Route Frequency Provider Last Rate Last Dose  . acetaminophen (TYLENOL) tablet 650 mg  650 mg Oral Q6H PRN Lavella Hammock, MD      . alum & mag hydroxide-simeth (MAALOX/MYLANTA) 200-200-20 MG/5ML suspension 30 mL  30 mL Oral Q4H PRN Lavella Hammock, MD      . amLODipine (NORVASC) tablet 5 mg  5 mg Oral Daily Lavella Hammock, MD   5 mg at 11/21/18 0911  . diphenoxylate-atropine (LOMOTIL) 2.5-0.025 MG per tablet 2 tablet  2 tablet Oral TID PRN Cobos, Myer Peer, MD      . DULoxetine (CYMBALTA) DR capsule 30 mg  30 mg Oral Daily Cobos, Myer Peer, MD   30 mg at 11/21/18 0911  . Eluxadoline TABS 100 mg  100 mg Oral BID Lavella Hammock, MD      . fluticasone [facility]) 50 MCG/ACT nasal spray 1 spray  1 spray Each Nare Daily Lavella Hammock, MD   1 spray at 11/19/18 [phone]  . hyoscyamine (ANASPAZ) disintergrating tablet 0.125 mg  0.125 mg Sublingual Q6H PRN Cobos, Myer Peer, MD      . LORazepam  (ATIVAN) tablet 0.5 mg  0.5 mg Oral Q6H PRN Cobos, Myer Peer, MD      . risperiDONE (RISPERDAL M-TABS) disintegrating tablet 2 mg  2 mg Oral Q8H PRN Lavella Hammock, MD       And  . LORazepam (ATIVAN) tablet 1 mg  1 mg Oral PRN Lavella Hammock, MD       And  . ziprasidone (GEODON) injection 20 mg  20 mg Intramuscular PRN Lavella Hammock, MD      . magnesium hydroxide (MILK OF MAGNESIA) suspension 30 mL  30 mL Oral Daily PRN Lavella Hammock,  MD      . multivitamin with minerals tablet 1 tablet  1 tablet Oral Daily Lavella Hammock, MD   1 tablet at 11/21/18 0908  . pantoprazole (PROTONIX) EC tablet 40 mg  40 mg Oral Daily Lavella Hammock, MD   40 mg at 11/21/18 0910  . potassium chloride (K-DUR) CR tablet 10 mEq  10 mEq Oral BID Cobos, Myer Peer, MD   10 mEq at 11/21/18 0908  . rosuvastatin (CRESTOR) tablet 10 mg  10 mg Oral Daily Lavella Hammock, MD   10 mg at 11/21/18 0910  . traZODone (DESYREL) tablet 50 mg  50 mg Oral QHS PRN Lavella Hammock, MD   50 mg at 11/20/18 2122  . vitamin C (ASCORBIC ACID) tablet 250 mg  250 mg Oral Daily Lavella Hammock, MD   250 mg at 11/21/18 4627    Lab Results:  Results for orders placed or performed during the hospital encounter of 11/18/18 (from the past 48 hour(s))  Basic metabolic panel     Status: Abnormal   Collection Time: 11/20/18  7:01 AM  Result Value Ref Range   Sodium 140 135 - 145 mmol/L   Potassium 3.3 (L) 3.5 - 5.1 mmol/L   Chloride 103 98 - 111 mmol/L   CO2 29 22 - 32 mmol/L   Glucose, Bld 96 70 - 99 mg/dL   BUN 20 6 - 20 mg/dL   Creatinine, Ser 0.83 0.61 - 1.24 mg/dL   Calcium 9.1 8.9 - 10.3 mg/dL   GFR calc non Af Amer >60 >60 mL/min   GFR calc Af Amer >60 >60 mL/min   Anion gap 8 5 - 15    Comment: Performed at [facility], [place] [address], [place], [place] [address]  TSH     Status: None   Collection Time: 11/20/18  7:01 AM  Result Value Ref Range   TSH 0.478 0.350 - 4.500 uIU/mL    Comment:  Performed by a 3rd Generation assay with a functional sensitivity of <=0.01 uIU/mL. Performed at [facility], [place] [address], [place], [place] [address]     Blood Alcohol level:  Lab Results  Component Value Date   [facility] <10 11/17/2018   ETH <5 29/93/7169    Metabolic Disorder Labs: Lab Results  Component Value Date   HGBA1C 5.1 11/14/2015   Lab Results  Component Value Date   PROLACTIN 6.8 11/14/2015   Lab Results  Component Value Date   CHOL 154 06/11/2018   TRIG 84 06/11/2018   HDL 62 06/11/2018   CHOLHDL 2.5 06/11/2018   VLDL 49 (H) 11/14/2015   LDLCALC 75 06/11/2018   LDLCALC 66 07/02/2017    Physical Findings: AIMS: Facial and Oral Movements Muscles of Facial Expression: None, normal Lips and Perioral Area: None, normal Jaw: None, normal Tongue: None, normal,Extremity Movements Upper (arms, wrists, hands, fingers): None, normal Lower (legs, knees, ankles, toes): None, normal, Trunk Movements Neck, shoulders, hips: None, normal, Overall Severity Severity of abnormal movements (highest score from questions above): None, normal Incapacitation due to abnormal movements: None, normal Patient's awareness of abnormal movements (rate only patient's report): No Awareness, Dental Status Current problems with teeth and/or dentures?: No Does patient usually wear dentures?: No  CIWA:    COWS:     Musculoskeletal: Strength & Muscle Tone: within normal limits Gait & Station: normal Patient leans: N/A  Psychiatric Specialty Exam: Physical Exam  Nursing note and vitals reviewed. Constitutional: He  is oriented to person, place, and time.  Neurological: He is alert and oriented to person, place, and time.    Review of Systems  Psychiatric/Behavioral: Positive for depression. Negative for hallucinations, memory loss, substance abuse and suicidal ideas. The patient is nervous/anxious and has insomnia.   All other systems reviewed and are negative.  no  chest pain, no shortness of breath, no vomiting, no fever, no chills.  Reports chronic sinus congestion.  Blood pressure (!) 149/110, pulse 72, temperature 98 F (36.7 C), temperature source Oral, resp. rate 20, height 5\' 7"  (1.702 m), weight 64.4 kg, SpO2 99 %.Body mass index is 22.24 kg/m.  General Appearance: Fairly Groomed  Eye Contact:  Fair  Speech:  Normal Rate  Volume:  Normal  Mood:  Depressed, anxious  Affect:  Congruent  Thought Process:  Linear and Descriptions of Associations: Intact  Orientation:  Other:  Fully alert and attentive  Thought Content:  Denies hallucinations, no delusions expressed  Suicidal Thoughts:  No-currently denies suicidal or self-injurious ideations, denies homicidal or violent ideations, contracts for safety on unit  Homicidal Thoughts:  No  Memory:  Recent and remote grossly intact  Judgement:  Fair  Insight:  Fair  Psychomotor Activity:  Normal  Concentration:  Concentration: Good and Attention Span: Good  Recall:  Good  Fund of Knowledge:  Good  Language:  Good  Akathisia:  Negative  Handed:  Right  AIMS (if indicated):     Assets:  Communication Skills Desire for Improvement Resilience  ADL's:  Intact  Cognition:  WNL  Sleep:  Number of Hours: 5.75   Assessment: 58 year old single male, presented under IVC due to depression and suicidal ideations, with thoughts of walking into traffic.  Also endorsed neurovegetative symptoms such as low energy and anhedonia.  Attributed depression partly to a recent break-up, unemployment.  Also reported chronic medical issues, particularly chronic sinus symptoms and a history of IBS.  History of prior psychiatric admissions, most recently in 2018.  Has been diagnosed with OCD and depression in the past.   Treatment Plan Summary: Reviewed current treatment plan 11/21/2018. Will continue the following plan without adjustments at this time.  Daily contact with patient to assess and evaluate symptoms and  progress in treatment, Medication management, Plan inpatient treatment and medications as below Encourage group and milieu participation to work on coping skills and symptom reduction Continue Norvasc 5 mg daily for hypertension Continue Crestor for hyperlipidemia Continue Eluxadoline for history of IBS Continue Hyoscyamine for cramps as needed Continue Lomotil PRN for diarrhea if needed  Continue Protonix 40 mgrs QDAY for GERD, gastric protection Continue Cymbalta 30 mgrs QDAY for depression and anxety Continue Trazodone 50 mgrs QHS PRN for insomnia(states sleep is poor only because of the environment. Endorses no concerns with sleep at home per his report). Treatment team working on disposition planning options   Mordecai Maes, NP 11/21/2018, 10:57 AM   Patient ID: William Coleman., male   DOB: [birth year], 58 y.o.   MRN: [id number] Attest to NP progress note

## 2018-11-21 NOTE — Progress Notes (Signed)
Patient ID: William Coleman., male   DOB: 28-Jan-1961, 58 y.o.   MRN: [id number]  [place] NOVEL CORONAVIRUS (COVID-19) DAILY CHECK-OFF SYMPTOMS - answer yes or no to each - every day NO YES  Have you had a fever in the past 24 hours?  . Fever (Temp > 37.80C / 100F) X   Have you had any of these symptoms in the past 24 hours? . New Cough .  Sore Throat  .  Shortness of Breath .  Difficulty Breathing .  Unexplained Body Aches   X   Have you had any one of these symptoms in the past 24 hours not related to allergies?   . Runny Nose .  Nasal Congestion .  Sneezing   X   If you have had runny nose, nasal congestion, sneezing in the past 24 hours, has it worsened?  X   EXPOSURES - check yes or no X   Have you traveled outside the state in the past 14 days?  X   Have you been in contact with someone with a confirmed diagnosis of COVID-19 or PUI in the past 14 days without wearing appropriate PPE?  X   Have you been living in the same home as a person with confirmed diagnosis of COVID-19 or a PUI (household contact)?    X   Have you been diagnosed with COVID-19?    X              What to do next: Answered NO to all: Answered YES to anything:   Proceed with unit schedule Follow the [facility] Inpatient Flowsheet.

## 2018-11-22 MED ORDER — DULOXETINE HCL 20 MG PO CPEP
40.0000 mg | ORAL_CAPSULE | Freq: Every day | ORAL | Status: DC
  Administered 2018-11-23 – 2018-11-25 (×3): 40 mg via ORAL
  Filled 2018-11-22 (×5): qty 2

## 2018-11-22 NOTE — Progress Notes (Signed)
DAR NOTE: Patient presents with anxious affect and depressed mood.  Denies suicidal thoughts, auditory and visual hallucinations.  Rates depression at 5, hopelessness at 5 and anxiety at 4.  Maintained on routine safety checks.  Medications given as prescribed.  Support and encouragement offered as needed.  Attended group and participated.  States goal for today is getting enough sleep."  Patient observed socializing with peers in the dayroom.  Offered no complaint.

## 2018-11-22 NOTE — Progress Notes (Signed)
Adult Psychoeducational Group Note  Date:  11/22/2018 Time:  9:33 PM  Group Topic/Focus:  Wrap-Up Group:   The focus of this group is to help patients review their daily goal of treatment and discuss progress on daily workbooks.  Participation Level:  Active  Participation Quality:  Appropriate and Attentive  Affect:  Flat  Cognitive:  Alert, Appropriate and Oriented  Insight: Appropriate  Engagement in Group:  Engaged  Modes of Intervention:  Discussion and Education  Additional Comments:  Pt attended and participated in group. Pt stated his goal today was to feel better. Pt rated his day 8/10.  Milus Glazier 11/22/2018, 9:33 PM

## 2018-11-22 NOTE — Progress Notes (Addendum)
[facility] MD Progress Note  11/22/2018 12:44 PM William Coleman.  MRN:  [id number]  Subjective: [name] reports " I am feeling the same"  Evaluation: [name] observed sitting in day room interacting with peers.  He reports ongoing depression related to a recent break-up with a girlfriend and declining health issues.  [name] rates his depression 8 out of 10 with 10 being the worst.  He denies suicidal or homicidal ideations.  Denies auditory or visual hallucinations.  Patient appears to be codependent with male companionship.  As he continues to ruminate about " I will never be involved in another relationship because of my age." "  I will always be lonely and unhappy."  Patient reported " I am no longer in dating states because are was involved with current girlfriend for the past 21 months."  [name] reports issues related to his past relationships and "personal atonements."   [name] reports taking and tolerating medications well.  Discussed titration to Cymbalta from 30 mg to 40 mg patient was agreeable to plan.  Patient reports he is hopeful to be discharged soon as he states he needs to get back to take care of his housing due to leaking air vents.  Reports his sleep has improved slightly continues to report chronic back pain. reports a good appetite.  States he is resting well throughout the night.  Support, encouragement and reassurance was provided.  Principal Problem: MDD (major depressive disorder), recurrent severe, without psychosis ([place]) Diagnosis: Principal Problem:   MDD (major depressive disorder), recurrent severe, without psychosis ([place]) Active Problems:   OCD (obsessive compulsive disorder)  Total Time spent with patient: 15 minutes  Past Psychiatric History:   Past Medical History:  Past Medical History:  Diagnosis Date  . Anancastic neurosis   . Anginal pain ([place])   . Anxiety, generalized   . Bipolar disorder ([place])   . Chronic diarrhea   . Depression   .  Hypertension   . Hypothyroidism   . Obsessive compulsive disorder   . OCD (obsessive compulsive disorder)   . Sleep apnea    patient states it was "marginal", no CPAP    Past Surgical History:  Procedure Laterality Date  . COLONOSCOPY WITH PROPOFOL N/A 02/05/2016   Procedure: COLONOSCOPY WITH PROPOFOL;  Surgeon: [name], MD;  Location: [facility];  Service: Endoscopy;  Laterality: N/A;  . MOUTH SURGERY     orthodontic surgery for underbite  . MYRINGOTOMY WITH TUBE PLACEMENT    . NASAL SEPTUM SURGERY    . POLYPECTOMY N/A 02/05/2016   Procedure: POLYPECTOMY;  Surgeon: [name], MD;  Location: [facility];  Service: Endoscopy;  Laterality: N/A;   Family History:  Family History  Problem Relation Age of Onset  . Depression Mother   . Anxiety disorder Mother   . Congestive Heart Failure Father   . Prostate cancer Father   . Hypertension Father    Family Psychiatric  History:  Social History:  Social History   Substance and Sexual Activity  Alcohol Use Yes   Comment: rarely     Social History   Substance and Sexual Activity  Drug Use No    Social History   Socioeconomic History  . Marital status: Single    Spouse name: Not on file  . Number of children: Not on file  . Years of education: Not on file  . Highest education level: Not on file  Occupational History  . Not on file  Social Needs  .  Financial resource strain: Not on file  . Food insecurity    Worry: Not on file    Inability: Not on file  . Transportation needs    Medical: Not on file    Non-medical: Not on file  Tobacco Use  . Smoking status: Never Smoker  . Smokeless tobacco: Never Used  Substance and Sexual Activity  . Alcohol use: Yes    Comment: rarely  . Drug use: No  . Sexual activity: Never    Birth control/protection: Abstinence  Lifestyle  . Physical activity    Days per week: Not on file    Minutes per session: Not on file  . Stress: Not on file  Relationships   . Social Herbalist on phone: Not on file    Gets together: Not on file    Attends religious service: Not on file    Active member of club or organization: Not on file    Attends meetings of clubs or organizations: Not on file    Relationship status: Not on file  Other Topics Concern  . Not on file  Social History Narrative  . Not on file   Additional Social History:    Pain Medications: See PTA Prescriptions: See PTA Over the Counter: See PTA History of alcohol / drug use?: No history of alcohol / drug abuse Longest period of sobriety (when/how long): Denies past and current use Negative Consequences of Use: (Denies past and current) Withdrawal Symptoms: (Pt denied)  Sleep: Poor-due to bed feeling uncomfortable.   Appetite:  Good  Current Medications: Current Facility-Administered Medications  Medication Dose Route Frequency Provider Last Rate Last Dose  . acetaminophen (TYLENOL) tablet 650 mg  650 mg Oral Q6H PRN Lavella Hammock, MD      . alum & mag hydroxide-simeth (MAALOX/MYLANTA) 200-200-20 MG/5ML suspension 30 mL  30 mL Oral Q4H PRN Lavella Hammock, MD      . amLODipine (NORVASC) tablet 5 mg  5 mg Oral Daily Lavella Hammock, MD   5 mg at 11/22/18 0858  . diphenoxylate-atropine (LOMOTIL) 2.5-0.025 MG per tablet 2 tablet  2 tablet Oral TID PRN Stephannie Broner, Myer Peer, MD      . DULoxetine (CYMBALTA) DR capsule 30 mg  30 mg Oral Daily Karlon Schlafer, Myer Peer, MD   30 mg at 11/22/18 0858  . Eluxadoline TABS 100 mg  100 mg Oral BID Lavella Hammock, MD      . fluticasone [facility]) 50 MCG/ACT nasal spray 1 spray  1 spray Each Nare Daily Lavella Hammock, MD   1 spray at 11/19/18 [phone]  . hyoscyamine (ANASPAZ) disintergrating tablet 0.125 mg  0.125 mg Sublingual Q6H PRN Vandana Haman A, MD      . LORazepam (ATIVAN) tablet 0.5 mg  0.5 mg Oral Q6H PRN Zaela Graley, Myer Peer, MD   0.5 mg at 11/21/18 1510  . risperiDONE (RISPERDAL M-TABS) disintegrating tablet 2 mg  2 mg Oral Q8H PRN  Lavella Hammock, MD       And  . LORazepam (ATIVAN) tablet 1 mg  1 mg Oral PRN Lavella Hammock, MD       And  . ziprasidone (GEODON) injection 20 mg  20 mg Intramuscular PRN Lavella Hammock, MD      . magnesium hydroxide (MILK OF MAGNESIA) suspension 30 mL  30 mL Oral Daily PRN Lavella Hammock, MD      . multivitamin with minerals tablet 1 tablet  1 tablet  Oral Daily Lavella Hammock, MD   1 tablet at 11/22/18 [phone]  . pantoprazole (PROTONIX) EC tablet 40 mg  40 mg Oral Daily Lavella Hammock, MD   40 mg at 11/22/18 0900  . rosuvastatin (CRESTOR) tablet 10 mg  10 mg Oral Daily Lavella Hammock, MD   10 mg at 11/22/18 0901  . traZODone (DESYREL) tablet 50 mg  50 mg Oral QHS PRN Laverle Hobby, PA-C   50 mg at 11/21/18 2107  . vitamin C (ASCORBIC ACID) tablet 250 mg  250 mg Oral Daily Lavella Hammock, MD   250 mg at 11/22/18 0901    Lab Results:  No results found for this or any previous visit (from the past 48 hour(s)).  Blood Alcohol level:  Lab Results  Component Value Date   ETH <10 11/17/2018   ETH <5 09/47/0962    Metabolic Disorder Labs: Lab Results  Component Value Date   HGBA1C 5.1 11/14/2015   Lab Results  Component Value Date   PROLACTIN 6.8 11/14/2015   Lab Results  Component Value Date   CHOL 154 06/11/2018   TRIG 84 06/11/2018   HDL 62 06/11/2018   CHOLHDL 2.5 06/11/2018   VLDL 49 (H) 11/14/2015   LDLCALC 75 06/11/2018   LDLCALC 66 07/02/2017    Physical Findings: AIMS: Facial and Oral Movements Muscles of Facial Expression: None, normal Lips and Perioral Area: None, normal Jaw: None, normal Tongue: None, normal,Extremity Movements Upper (arms, wrists, hands, fingers): None, normal Lower (legs, knees, ankles, toes): None, normal, Trunk Movements Neck, shoulders, hips: None, normal, Overall Severity Severity of abnormal movements (highest score from questions above): None, normal Incapacitation due to abnormal movements: None, normal Patient's  awareness of abnormal movements (rate only patient's report): No Awareness, Dental Status Current problems with teeth and/or dentures?: No Does patient usually wear dentures?: No  CIWA:    COWS:     Musculoskeletal: Strength & Muscle Tone: within normal limits Gait & Station: normal Patient leans: N/A  Psychiatric Specialty Exam: Physical Exam  Nursing note and vitals reviewed. Constitutional: He is oriented to person, place, and time.  Neurological: He is alert and oriented to person, place, and time.    Review of Systems  Psychiatric/Behavioral: Positive for depression. Negative for hallucinations, memory loss, substance abuse and suicidal ideas. The patient is nervous/anxious and has insomnia.   All other systems reviewed and are negative.   Blood pressure (!) 133/94, pulse 87, temperature 98.1 F (36.7 C), temperature source Oral, resp. rate 20, height 5\' 7"  (1.702 m), weight 64.4 kg, SpO2 99 %.Body mass index is 22.24 kg/m.  General Appearance: Fairly Groomed  Eye Contact:  Fair  Speech:  Normal Rate  Volume:  Normal  Mood:  Depressed, anxious  Affect:  Congruent  Thought Process:  Linear  Orientation:  Other:  Fully alert and attentive  Thought Content:  Denies hallucinations, no delusions expressed  Suicidal Thoughts:  No  Homicidal Thoughts:  No  Memory:  Recent and remote grossly intact  Judgement:  Fair  Insight:  Fair  Psychomotor Activity:  Normal  Concentration:  Concentration: Good and Attention Span: Good  Recall:  Good  Fund of Knowledge:  Good  Language:  Good  Akathisia:  Negative  Handed:  Right  AIMS (if indicated):     Assets:  Communication Skills Desire for Improvement Resilience  ADL's:  Intact  Cognition:  WNL  Sleep:  Number of Hours: 6.25     Treatment  Plan Summary:  Daily contact with patient to assess and evaluate symptoms and progress in treatment and Medication management   Continue with current treatment plan on 11/22/2018 as  listed below except were noted  Major depressive disorder  Continue Norvasc 5 mg daily for hypertension Continue Crestor for hyperlipidemia Continue Eluxadoline for history of IBS Continue Hyoscyamine for cramps as needed Continue Lomotil PRN for diarrhea if needed  Continue Protonix 40 mgrs QDAY for GERD, gastric protection Increased Cymbalta 30 mg to 40 mg  QDAY for depression and anxety Continue Trazodone 50 mgrs QHS PRN for insomnia  Treatment team working on disposition planning options  Encourage group and milieu participation to work on Radiographer, therapeutic and symptom reduction [place], NP 11/22/2018, 12:44 PM   Patient ID: William Coleman., male   DOB: [birth year], 58 y.o.   MRN: [id number]  Attest to NP progress note

## 2018-11-22 NOTE — BHH Group Notes (Signed)
[place] LCSW Group Therapy Note  11/22/2018   10:00-11:00AM  Type of Therapy and Topic:  Group Therapy:  Unhealthy versus Healthy Supports, Which Am I?  Participation Level:  Active   Description of Group:  Patients in this group were introduced to the concept that additional supports including self-support are an essential part of recovery.  Initially a discussion was held about the differences between healthy versus unhealthy supports.  Patients were asked to share what unhealthy supports in their lives need to be addressed, as well as what additional healthy supports could be added for greater help in reaching their goals.   A song entitled "My Own Hero" was played and a group discussion ensued in which patients stated they could relate to the song and it inspired them to realize they have be willing to help themselves in order to succeed, because other people cannot achieve sobriety or stability for them.  We discussed adding a variety of healthy supports to address the various needs in patient lives, including becoming more self-supportive.  A song was played called "I Know Where I've Been" toward the end of group and used to conduct an inspirational wrap-up to group of remembering how far they have already come in their journey.  Therapeutic Goals: 1)  Highlight the differences between healthy and unhealthy supports 2)  Suggest the importance of being a part of one's own support system 2)  Discuss reasons people in one's life may eventually be unable to be continually supportive  3)  Identify the patient's current support system   4) Elicit commitments to add healthy supports and to become more conscious of being self-supportive   Summary of Patient Progress:  The patient listed as healthy supports the following:  Distant relatives, sister and her husband to some degree.  He focused for a time on his ex-girlfriend's former healthy support that is no longer available to him except by text, which he  stated "is not cutting it."  The patient expressed that unhealthy supports to be addressed include relatives who don't understand his physical ailments.  Patient feels they are an unhealthy support for themselves.  Healthy supports which could be added for increased stability and happiness include professionals.  Therapeutic Modalities:   Motivational Interviewing Activity  Maretta Los , MSW, LCSW

## 2018-11-22 NOTE — BHH Suicide Risk Assessment (Signed)
[facility] INPATIENT:  Family/Significant Other Suicide Prevention Education  Suicide Prevention Education:  Education Completed; sister, William Coleman [phone]) ,  (name of family member/significant other) has been identified by the patient as the family member/significant other with whom the patient will be residing, and identified as the person(s) who will aid the patient in the event of a mental health crisis (suicidal ideations/suicide attempt).  With written consent from the patient, the family member/significant other has been provided the following suicide prevention education, prior to the and/or following the discharge of the patient.  Sister states that patient has severe OCD, which plays into a lot of "bathroom issues."  This has been the case his whole life.  In the past was also diagnosed with ADD.   Sister lives in [place] and has not physically seen him since Christmas.  He does go to her house for holidays, birthdays, etc.  COVID-19 restrictions have halted his visits there for awhile.  He typically does not call her or tell her what is going on with him, which he did not this time, and so she was not aware that his girlfriend had broken up with him.  It does not surprise her that he became despondent over this, as this is a pattern for him.    Sister is a retired Teaching laboratory technician, did not feel a need for suicide prevention education to be provided.   The suicide prevention education provided includes the following:  Suicide risk factors  Suicide prevention and interventions  National Suicide Hotline telephone number  [facility] assessment telephone number  [facility] Emergency Assistance [place] and/or Residential Mobile Crisis Unit telephone number  Request made of family/significant other to:  Remove weapons (e.g., guns, rifles, knives), all items previously/currently identified as safety concern.    Remove drugs/medications  (over-the-counter, prescriptions, illicit drugs), all items previously/currently identified as a safety concern.  The family member/significant other verbalizes understanding of the suicide prevention education information provided.  The family member/significant other agrees to remove the items of safety concern listed above.  William Coleman 11/22/2018, 3:27 PM

## 2018-11-22 NOTE — Progress Notes (Signed)
Patient has been observed up in the dayroom watching tv and interacting with peers. He reports his day was fair and Probation officer praised him for not day ing his day had been bad. He still complains about the bed being so uncomfortable and writer informed him that was out of my control. He requested trazodone for sleep tonight. Support given and safety maintained on unit with 15 min checks.;

## 2018-11-22 NOTE — B[facility] Suicide Risk Assessment (Signed)
[facility] INPATIENT:  Family/Significant Other Suicide Prevention Education  Suicide Prevention Education:  Contact Attempts: sister, Marciano Sequin [phone], (name of family member/significant other) has been identified by the patient as the family member/significant other with whom the patient will be residing, and identified as the person(s) who will aid the patient in the event of a mental health crisis.  With written consent from the patient, two attempts were made to provide suicide prevention education, prior to and/or following the patient's discharge.  We were unsuccessful in providing suicide prevention education.  A suicide education pamphlet was given to the patient to share with family/significant other.  Date and time of first attempt:  11/22/2018  /  9:25 AM  Date and time of second attempt:  CSW team to follow up  William Coleman 11/22/2018, 9:24 AM

## 2018-11-23 NOTE — Progress Notes (Signed)
DAR NOTE: Patient presents with anxious affect and depressed mood.  Denies suicidal thoughts, auditory and visual hallucinations.  Described energy level as low and concentration as good.  Rates depression at 7, hopelessness at 7, and anxiety at 6.  Maintained on routine safety checks.  Medications given as prescribed.  Support and encouragement offered as needed.    States goal for today is "discharge."  Patient observed socializing with peers in the dayroom.  Patient is safe on and off the unit.

## 2018-11-23 NOTE — Progress Notes (Addendum)
[facility] MD Progress Note  11/23/2018 12:55 PM Jerzy Colleen Can.  MRN:  [id number]  Subjective: patient endorses some improvement, but states still feeling depressed, anxious. Denies suicidal ideations at this time. Denies medication side effects.  Objective: I have discussed case with treatment team and have met with patient . 58 year old male, presented under IVD for depression, SI with thoughts of walking into traffic . Attributes in part to recent break up and sense of loneliness. History of depression and OCD .   Patient presents alert, attentive, cooperative on approach. Endorses some improvement but continues to report depression and ruminates about recent break up . States he feels he is at an age where developing meaningful relationships will be increasingly difficult, and makes statements such as " I don't want to feel alone for the rest of my life ". Responds partially to support, review of ego strengths. Remains somatically focused but to lesser degree than on admission. Denies medication side effects, currently on Cymbalta, although ruminates about whether this medication may cause bowel habit changes or his feces to change . States he has had problems with " bigger bowel movements " with prior antidepressant trials. Of note, currently denies diarrhea or constipation. Visible in day room, some interaction with peers, no disruptive or agitated behaviors on unit  .   Principal Problem: MDD (major depressive disorder), recurrent severe, without psychosis ([place]) Diagnosis: Principal Problem:   MDD (major depressive disorder), recurrent severe, without psychosis ([place]) Active Problems:   OCD (obsessive compulsive disorder)  Total Time spent with patient: 20 minutes  Past Psychiatric History:   Past Medical History:  Past Medical History:  Diagnosis Date  . Anancastic neurosis   . Anginal pain ([place])   . Anxiety, generalized   . Bipolar disorder ([place])   . Chronic diarrhea   .  Depression   . Hypertension   . Hypothyroidism   . Obsessive compulsive disorder   . OCD (obsessive compulsive disorder)   . Sleep apnea    patient states it was "marginal", no CPAP    Past Surgical History:  Procedure Laterality Date  . COLONOSCOPY WITH PROPOFOL N/A 02/05/2016   Procedure: COLONOSCOPY WITH PROPOFOL;  Surgeon: [name], MD;  Location: [place];  Service: Endoscopy;  Laterality: N/A;  . MOUTH SURGERY     orthodontic surgery for underbite  . MYRINGOTOMY WITH TUBE PLACEMENT    . NASAL SEPTUM SURGERY    . POLYPECTOMY N/A 02/05/2016   Procedure: POLYPECTOMY;  Surgeon: [name], MD;  Location: [place];  Service: Endoscopy;  Laterality: N/A;   Family History:  Family History  Problem Relation Age of Onset  . Depression Mother   . Anxiety disorder Mother   . Congestive Heart Failure Father   . Prostate cancer Father   . Hypertension Father    Family Psychiatric  History:  Social History:  Social History   Substance and Sexual Activity  Alcohol Use Yes   Comment: rarely     Social History   Substance and Sexual Activity  Drug Use No    Social History   Socioeconomic History  . Marital status: Single    Spouse name: Not on file  . Number of children: Not on file  . Years of education: Not on file  . Highest education level: Not on file  Occupational History  . Not on file  Social Needs  . Financial resource strain: Not on file  . Food insecurity  Worry: Not on file    Inability: Not on file  . Transportation needs    Medical: Not on file    Non-medical: Not on file  Tobacco Use  . Smoking status: Never Smoker  . Smokeless tobacco: Never Used  Substance and Sexual Activity  . Alcohol use: Yes    Comment: rarely  . Drug use: No  . Sexual activity: Never    Birth control/protection: Abstinence  Lifestyle  . Physical activity    Days per week: Not on file    Minutes per session: Not on file  . Stress: Not on file   Relationships  . Social Herbalist on phone: Not on file    Gets together: Not on file    Attends religious service: Not on file    Active member of club or organization: Not on file    Attends meetings of clubs or organizations: Not on file    Relationship status: Not on file  Other Topics Concern  . Not on file  Social History Narrative  . Not on file   Additional Social History:    Pain Medications: See PTA Prescriptions: See PTA Over the Counter: See PTA History of alcohol / drug use?: No history of alcohol / drug abuse Longest period of sobriety (when/how long): Denies past and current use Negative Consequences of Use: (Denies past and current) Withdrawal Symptoms: (Pt denied)  Sleep: improving   Appetite:  improving   Current Medications: Current Facility-Administered Medications  Medication Dose Route Frequency Provider Last Rate Last Dose  . acetaminophen (TYLENOL) tablet 650 mg  650 mg Oral Q6H PRN Lavella Hammock, MD      . alum & mag hydroxide-simeth (MAALOX/MYLANTA) 200-200-20 MG/5ML suspension 30 mL  30 mL Oral Q4H PRN Lavella Hammock, MD      . amLODipine (NORVASC) tablet 5 mg  5 mg Oral Daily Lavella Hammock, MD   5 mg at 11/23/18 0819  . diphenoxylate-atropine (LOMOTIL) 2.5-0.025 MG per tablet 2 tablet  2 tablet Oral TID PRN Zynia Wojtowicz, Myer Peer, MD      . DULoxetine (CYMBALTA) DR capsule 40 mg  40 mg Oral Daily Derrill Center, NP   40 mg at 11/23/18 0819  . Eluxadoline TABS 100 mg  100 mg Oral BID Lavella Hammock, MD      . fluticasone [facility]) 50 MCG/ACT nasal spray 1 spray  1 spray Each Nare Daily Lavella Hammock, MD   1 spray at 11/23/18 0800  . hyoscyamine (ANASPAZ) disintergrating tablet 0.125 mg  0.125 mg Sublingual Q6H PRN Garo Heidelberg, Myer Peer, MD      . LORazepam (ATIVAN) tablet 0.5 mg  0.5 mg Oral Q6H PRN Matalyn Nawaz, Myer Peer, MD   0.5 mg at 11/22/18 2107  . risperiDONE (RISPERDAL M-TABS) disintegrating tablet 2 mg  2 mg Oral Q8H PRN Lavella Hammock, MD       And  . LORazepam (ATIVAN) tablet 1 mg  1 mg Oral PRN Lavella Hammock, MD       And  . ziprasidone (GEODON) injection 20 mg  20 mg Intramuscular PRN Lavella Hammock, MD      . magnesium hydroxide (MILK OF MAGNESIA) suspension 30 mL  30 mL Oral Daily PRN Lavella Hammock, MD      . multivitamin with minerals tablet 1 tablet  1 tablet Oral Daily Lavella Hammock, MD   1 tablet at 11/23/18 0820  . pantoprazole (  PROTONIX) EC tablet 40 mg  40 mg Oral Daily Lavella Hammock, MD   40 mg at 11/23/18 0820  . rosuvastatin (CRESTOR) tablet 10 mg  10 mg Oral Daily Lavella Hammock, MD   10 mg at 11/23/18 0820  . traZODone (DESYREL) tablet 50 mg  50 mg Oral QHS PRN Laverle Hobby, PA-C   50 mg at 11/22/18 2107  . vitamin C (ASCORBIC ACID) tablet 250 mg  250 mg Oral Daily Lavella Hammock, MD   250 mg at 11/23/18 0820    Lab Results:  No results found for this or any previous visit (from the past 48 hour(s)).  Blood Alcohol level:  Lab Results  Component Value Date   ETH <10 11/17/2018   ETH <5 08/67/6195    Metabolic Disorder Labs: Lab Results  Component Value Date   HGBA1C 5.1 11/14/2015   Lab Results  Component Value Date   PROLACTIN 6.8 11/14/2015   Lab Results  Component Value Date   CHOL 154 06/11/2018   TRIG 84 06/11/2018   HDL 62 06/11/2018   CHOLHDL 2.5 06/11/2018   VLDL 49 (H) 11/14/2015   LDLCALC 75 06/11/2018   LDLCALC 66 07/02/2017    Physical Findings: AIMS: Facial and Oral Movements Muscles of Facial Expression: None, normal Lips and Perioral Area: None, normal Jaw: None, normal Tongue: None, normal,Extremity Movements Upper (arms, wrists, hands, fingers): None, normal Lower (legs, knees, ankles, toes): None, normal, Trunk Movements Neck, shoulders, hips: None, normal, Overall Severity Severity of abnormal movements (highest score from questions above): None, normal Incapacitation due to abnormal movements: None, normal Patient's awareness  of abnormal movements (rate only patient's report): No Awareness, Dental Status Current problems with teeth and/or dentures?: No Does patient usually wear dentures?: No  CIWA:    COWS:     Musculoskeletal: Strength & Muscle Tone: within normal limits Gait & Station: normal Patient leans: N/A  Psychiatric Specialty Exam: Physical Exam  Nursing note and vitals reviewed. Constitutional: He is oriented to person, place, and time.  Neurological: He is alert and oriented to person, place, and time.    Review of Systems  Psychiatric/Behavioral: Positive for depression. Negative for hallucinations, memory loss, substance abuse and suicidal ideas. The patient is nervous/anxious and has insomnia.   All other systems reviewed and are negative. denies headache , no chest pain, no shortness of breath. Reports chronic sinus symptoms, congestion. Currently denies constipation or diarrhea, no fever, no chills  Blood pressure (!) 136/97, pulse 82, temperature (!) 97.3 F (36.3 C), temperature source Oral, resp. rate 16, height _0  (1.702 m), weight 64.4 kg, SpO2 100 %.Body mass index is 22.24 kg/m.  General Appearance: Fairly Groomed  Eye Contact:  improved eye contact   Speech:  Normal Rate  Volume:  Normal  Mood:  reports some improvement, still depressed, vaguely anxious  Affect:  Congruent  Thought Process:  Linear  Orientation:  Other:  Fully alert and attentive  Thought Content:  Denies hallucinations, no delusions expressed, somatically focused   Suicidal Thoughts:  No currently denies suicidal or self injurious ideations, denies homicidal or violent ideations, contracts for safety on unit at this time  Homicidal Thoughts:  No  Memory:  Recent and remote grossly intact  Judgement:  Fair/ improving  Insight:  Fair  Psychomotor Activity:  Normal  Concentration:  Concentration: Good and Attention Span: Good  Recall:  Good  Fund of Knowledge:  Good  Language:  Good  Akathisia:  Negative  Handed:  Right  AIMS (if indicated):     Assets:  Communication Skills Desire for Improvement Resilience  ADL's:  Intact  Cognition:  WNL  Sleep:  Number of Hours: 6   Assessment- 58 year old male, presented under IVD for depression, SI with thoughts of walking into traffic . Attributes in part to recent break up and sense of loneliness. History of depression and OCD .Marland Kitchen  Currently endorses some improvement, but remains depressed and ruminative about recent break up and concerns about being lonely. Denies suicidal ideations. Tolerating medications ( currently on Cymbalta trial) well thus far . Continues to express somatic concerns. Responds partially to reassurance and support .  Treatment Plan Summary:  Daily contact with patient to assess and evaluate symptoms and progress in treatment and Medication management  Treatment plan reviewed as below today 6/15 Encourage group and milieu participation. Continue Norvasc 5 mg daily for hypertension Continue Crestor for hyperlipidemia Continue Eluxadoline for history of IBS Continue Hyoscyamine for cramps as needed Continue Lomotil PRN for diarrhea if needed  Continue Protonix 40 mgrs QDAY for GERD, gastric protection Continue  Cymbalta  40 mg  QDAY for depression and anxety Continue Trazodone 50 mgrs QHS PRN for insomnia Treatment team working on disposition planning options  Check BMP in AM to monitor K+/electrolytes.   Jenne Campus, MD 11/23/2018, 12:55 PM   Patient ID: Lamar Sprinkles., male   DOB: 1960/07/01, 58 y.o.   MRN: [id number]

## 2018-11-24 LAB — BASIC METABOLIC PANEL
Anion gap: 11 (ref 5–15)
BUN: 21 mg/dL — ABNORMAL HIGH (ref 6–20)
CO2: 28 mmol/L (ref 22–32)
Calcium: 9.3 mg/dL (ref 8.9–10.3)
Chloride: 105 mmol/L (ref 98–111)
Creatinine, Ser: 0.82 mg/dL (ref 0.61–1.24)
GFR calc Af Amer: >60 mL/min (ref >60)
GFR calc non Af Amer: >60 mL/min (ref >60)
Glucose, Bld: 92 mg/dL (ref 70–99)
Potassium: 3.5 mmol/L (ref 3.5–5.1)
Sodium: 144 mmol/L (ref 135–145)

## 2018-11-24 MED ORDER — TRAZODONE HCL 100 MG PO TABS
100.0000 mg | ORAL_TABLET | Freq: Every evening | ORAL | Status: DC | PRN
  Administered 2018-11-24: 100 mg via ORAL
  Filled 2018-11-24: qty 1

## 2018-11-24 NOTE — Progress Notes (Signed)
D:  Patient denied SI and HI, contracts for safety.  Denied A/V hallucinations.  Denied pain. A:  Medications administered per MD orders.  Emotional support and encouragement given patient. R:  Safety maintained with 15 minute checks.  

## 2018-11-24 NOTE — Progress Notes (Signed)
Spiritual care group on grief and loss facilitated by chaplain Jerene Pitch  Group Goal:  Support / Education around grief and loss Members engage in facilitated group support and psycho social education.  Group Description:  Following introductions and group rules,  Group members engaged in facilitated group dialog and support around topic of loss, with particular support around experiences of loss in their lives. Group Identified types of loss (relationships / self / things) and identified patterns, circumstances, and changes that precipitate losses. Reflected on thoughts / feelings around loss, normalized grief responses, and recognized variety in grief experience. Patient Progress:  Present throughout group.  Engaged in dialog around boundary setting in grief.

## 2018-11-24 NOTE — Progress Notes (Signed)
D:  William Coleman was up and visible on the unit.  He denied SI/HI or A/V hallucinations.  He was hyper-focused on Cymbalta possibly causing constipation, "I have tried a lot of medications and they haven't been effective."  Encouraged him to talk with the MD about his concern for constipation.  He continues to complain that he doesn't sleep well here because the beds are not comfortable.  He requested the same medications that he took last night because "I slept good with them."  He is currently resting with his eyes closed and appears to be asleep. A:  1:1 with RN for support and encouragement.  Medications as ordered.  Q 15 minute checks maintained for safety.  Encouraged participation in group and unit activities.   R:  [place] remains safe on the unit.  We will continue to monitor the progress towards his goals.

## 2018-11-24 NOTE — Progress Notes (Signed)
The focus of this group is to help patients establish daily goals to achieve during treatment and discuss how the patient can incorporate goal setting into their daily lives to aide in recovery. 

## 2018-11-24 NOTE — Progress Notes (Signed)
[facility] MD Progress Note  11/24/2018 2:49 PM William Aguas [name].  MRN:  [id number] Subjective: Patient is a 58 year old male with a past psychiatric history significant for obsessive-compulsive disorder and major depression who was admitted on 11/19/2018 with suicidal ideation.  Objective: Patient is seen and examined.  Patient is a 58 year old male with the above-stated past psychiatric history and a past medical history significant for irritable bowel syndrome, GERD, chronic sinusitis, hypertension.  He is seen in follow-up.  He stated he feels as though the Cymbalta is now making his bowels go the opposite direction from his usual problems.  He stated that he previously had IBS-D, but that the Cymbalta has caused him to be constipated.  He stated that he has had 1 large bowel movement today, and in the past he had this large bowel movement and had to call his apartment manager to help clean out the plumbing.  He is concerned about having large bowel movements.  He is concerned about this, and wants to consider stopping the Cymbalta.  We discussed the possibility of adding stool softeners but continuing the Cymbalta given the failures and side effects of other medications he had had in the past.  He stated he feels like his largest problem is that he needs a girlfriend.  He stated that he is lonely and worried about how he is going to get another girlfriend.  I tried to get him to focus on himself and what we could do to help him do better.  He denied any suicidal ideation today.  His blood pressure is elevated slightly at 143/99, pulse is regular and he is afebrile.  His CIWA was 1 today.  He slept 6.75 hours last night.  Review of his laboratories were all essentially normal.  Principal Problem: MDD (major depressive disorder), recurrent severe, without psychosis ([place]) Diagnosis: Principal Problem:   MDD (major depressive disorder), recurrent severe, without psychosis ([place]) Active Problems:   OCD  (obsessive compulsive disorder)  Total Time spent with patient: 20 minutes  Past Psychiatric History: See admission H&P  Past Medical History:  Past Medical History:  Diagnosis Date  . Anancastic neurosis   . Anginal pain ([place])   . Anxiety, generalized   . Bipolar disorder ([place])   . Chronic diarrhea   . Depression   . Hypertension   . Hypothyroidism   . Obsessive compulsive disorder   . OCD (obsessive compulsive disorder)   . Sleep apnea    patient states it was "marginal", no CPAP    Past Surgical History:  Procedure Laterality Date  . COLONOSCOPY WITH PROPOFOL N/A 02/05/2016   Procedure: COLONOSCOPY WITH PROPOFOL;  Surgeon: [name], MD;  Location: [place];  Service: Endoscopy;  Laterality: N/A;  . MOUTH SURGERY     orthodontic surgery for underbite  . MYRINGOTOMY WITH TUBE PLACEMENT    . NASAL SEPTUM SURGERY    . POLYPECTOMY N/A 02/05/2016   Procedure: POLYPECTOMY;  Surgeon: [name], MD;  Location: [place];  Service: Endoscopy;  Laterality: N/A;   Family History:  Family History  Problem Relation Age of Onset  . Depression Mother   . Anxiety disorder Mother   . Congestive Heart Failure Father   . Prostate cancer Father   . Hypertension Father    Family Psychiatric  History: See admission H&P Social History:  Social History   Substance and Sexual Activity  Alcohol Use Yes   Comment: rarely     Social History  Substance and Sexual Activity  Drug Use No    Social History   Socioeconomic History  . Marital status: Single    Spouse name: Not on file  . Number of children: Not on file  . Years of education: Not on file  . Highest education level: Not on file  Occupational History  . Not on file  Social Needs  . Financial resource strain: Not on file  . Food insecurity    Worry: Not on file    Inability: Not on file  . Transportation needs    Medical: Not on file    Non-medical: Not on file  Tobacco Use  . Smoking  status: Never Smoker  . Smokeless tobacco: Never Used  Substance and Sexual Activity  . Alcohol use: Yes    Comment: rarely  . Drug use: No  . Sexual activity: Never    Birth control/protection: Abstinence  Lifestyle  . Physical activity    Days per week: Not on file    Minutes per session: Not on file  . Stress: Not on file  Relationships  . Social Herbalist on phone: Not on file    Gets together: Not on file    Attends religious service: Not on file    Active member of club or organization: Not on file    Attends meetings of clubs or organizations: Not on file    Relationship status: Not on file  Other Topics Concern  . Not on file  Social History Narrative  . Not on file   Additional Social History:    Pain Medications: See PTA Prescriptions: See PTA Over the Counter: See PTA History of alcohol / drug use?: No history of alcohol / drug abuse Longest period of sobriety (when/how long): Denies past and current use Negative Consequences of Use: (Denies past and current) Withdrawal Symptoms: (Pt denied)                    Sleep: Good  Appetite:  Fair  Current Medications: Current Facility-Administered Medications  Medication Dose Route Frequency Provider Last Rate Last Dose  . acetaminophen (TYLENOL) tablet 650 mg  650 mg Oral Q6H PRN Lavella Hammock, MD      . alum & mag hydroxide-simeth (MAALOX/MYLANTA) 200-200-20 MG/5ML suspension 30 mL  30 mL Oral Q4H PRN Lavella Hammock, MD      . amLODipine (NORVASC) tablet 5 mg  5 mg Oral Daily Lavella Hammock, MD   5 mg at 11/24/18 0836  . diphenoxylate-atropine (LOMOTIL) 2.5-0.025 MG per tablet 2 tablet  2 tablet Oral TID PRN Cobos, Myer Peer, MD      . DULoxetine (CYMBALTA) DR capsule 40 mg  40 mg Oral Daily Derrill Center, NP   40 mg at 11/24/18 0836  . Eluxadoline TABS 100 mg  100 mg Oral BID Lavella Hammock, MD      . fluticasone [facility]) 50 MCG/ACT nasal spray 1 spray  1 spray Each Nare Daily  Lavella Hammock, MD   1 spray at 11/24/18 9024  . hyoscyamine (ANASPAZ) disintergrating tablet 0.125 mg  0.125 mg Sublingual Q6H PRN Cobos, Myer Peer, MD      . LORazepam (ATIVAN) tablet 0.5 mg  0.5 mg Oral Q6H PRN Cobos, Myer Peer, MD   0.5 mg at 11/23/18 2107  . risperiDONE (RISPERDAL M-TABS) disintegrating tablet 2 mg  2 mg Oral Q8H PRN Lavella Hammock, MD  And  . LORazepam (ATIVAN) tablet 1 mg  1 mg Oral PRN Lavella Hammock, MD       And  . ziprasidone (GEODON) injection 20 mg  20 mg Intramuscular PRN Lavella Hammock, MD      . magnesium hydroxide (MILK OF MAGNESIA) suspension 30 mL  30 mL Oral Daily PRN Lavella Hammock, MD      . multivitamin with minerals tablet 1 tablet  1 tablet Oral Daily Lavella Hammock, MD   1 tablet at 11/24/18 [phone]  . pantoprazole (PROTONIX) EC tablet 40 mg  40 mg Oral Daily Lavella Hammock, MD   40 mg at 11/24/18 0837  . rosuvastatin (CRESTOR) tablet 10 mg  10 mg Oral Daily Lavella Hammock, MD   10 mg at 11/24/18 [phone]  . traZODone (DESYREL) tablet 100 mg  100 mg Oral QHS PRN Sharma Covert, MD      . vitamin C (ASCORBIC ACID) tablet 250 mg  250 mg Oral Daily Lavella Hammock, MD   250 mg at 11/24/18 1638    Lab Results:  Results for orders placed or performed during the hospital encounter of 11/18/18 (from the past 48 hour(s))  Basic metabolic panel     Status: Abnormal   Collection Time: 11/24/18  6:33 AM  Result Value Ref Range   Sodium 144 135 - 145 mmol/L   Potassium 3.5 3.5 - 5.1 mmol/L   Chloride 105 98 - 111 mmol/L   CO2 28 22 - 32 mmol/L   Glucose, Bld 92 70 - 99 mg/dL   BUN 21 (H) 6 - 20 mg/dL   Creatinine, Ser 0.82 0.61 - 1.24 mg/dL   Calcium 9.3 8.9 - 10.3 mg/dL   GFR calc non Af Amer >60 >60 mL/min   GFR calc Af Amer >60 >60 mL/min   Anion gap 11 5 - 15    Comment: Performed at [facility], [place] [address], [place], [place] [address]    Blood Alcohol level:  Lab Results  Component Value Date   [facility]  <10 11/17/2018   ETH <5 [id number]    Metabolic Disorder Labs: Lab Results  Component Value Date   HGBA1C 5.1 11/14/2015   Lab Results  Component Value Date   PROLACTIN 6.8 11/14/2015   Lab Results  Component Value Date   CHOL 154 06/11/2018   TRIG 84 06/11/2018   HDL 62 06/11/2018   CHOLHDL 2.5 06/11/2018   VLDL 49 (H) 11/14/2015   LDLCALC 75 06/11/2018   LDLCALC 66 07/02/2017    Physical Findings: AIMS: Facial and Oral Movements Muscles of Facial Expression: None, normal Lips and Perioral Area: None, normal Jaw: None, normal Tongue: None, normal,Extremity Movements Upper (arms, wrists, hands, fingers): None, normal Lower (legs, knees, ankles, toes): None, normal, Trunk Movements Neck, shoulders, hips: None, normal, Overall Severity Severity of abnormal movements (highest score from questions above): None, normal Incapacitation due to abnormal movements: None, normal Patient's awareness of abnormal movements (rate only patient's report): No Awareness, Dental Status Current problems with teeth and/or dentures?: No Does patient usually wear dentures?: No  CIWA:  CIWA-Ar Total: 1 COWS:  COWS Total Score: 1  Musculoskeletal: Strength & Muscle Tone: within normal limits Gait & Station: normal Patient leans: N/A  Psychiatric Specialty Exam: Physical Exam  Nursing note and vitals reviewed. Constitutional: He is oriented to person, place, and time. He appears well-developed and well-nourished.  HENT:  Head: Normocephalic and atraumatic.  Respiratory: Effort  normal.  Neurological: He is alert and oriented to person, place, and time.    ROS  Blood pressure (!) 143/99, pulse 79, temperature 97.8 F (36.6 C), temperature source Oral, resp. rate 16, height 5\' 7"  (1.702 m), weight 64.4 kg, SpO2 100 %.Body mass index is 22.24 kg/m.  General Appearance: Casual  Eye Contact:  Fair  Speech:  Normal Rate  Volume:  Normal  Mood:  Anxious  Affect:  Congruent  Thought  Process:  Coherent and Descriptions of Associations: Intact  Orientation:  Full (Time, Place, and Person)  Thought Content:  Obsessions and Rumination  Suicidal Thoughts:  No  Homicidal Thoughts:  No  Memory:  Immediate;   Fair Recent;   Fair Remote;   Fair  Judgement:  Intact  Insight:  Fair  Psychomotor Activity:  Increased  Concentration:  Concentration: Fair and Attention Span: Fair  Recall:  [employer] of Knowledge:  Fair  Language:  Good  Akathisia:  Negative  Handed:  Right  AIMS (if indicated):     Assets:  Desire for Improvement Resilience  ADL's:  Intact  Cognition:  WNL  Sleep:  Number of Hours: 6.75     Treatment Plan Summary: Daily contact with patient to assess and evaluate symptoms and progress in treatment, Medication management and Plan : Patient is a 58 year old male with the above-stated past psychiatric history who is seen in follow-up.   Diagnosis: #1 major depression, recurrent, severe without psychotic features, #2 obsessive-compulsive disorder, #3 irritable bowel syndrome; mixed  Patient is seen in follow-up.  He is improving, and denied suicidal ideation today.  He feels as though the Cymbalta may be causing him some irritable bowel symptoms, but have asked him to just remain on the 40 mg dose and see if he could tolerate it.  I did recommend going on some stool softeners, but he would like to hold off on that for now.  No change in his current psychiatric medications.  His blood pressure still is elevated and I will increase his amlodipine to 10 mg p.o. daily for that. 1.  Increase amlodipine to 10 mg p.o. daily for hypertension. 2.  Continue Lomotil as needed for diarrhea. 3.  Continue Eluxadoline for irritable bowel syndrome. 4.  Continue Flonase 1 spray in each nostril daily for allergies. 5.  Continue hycosamine 0.125 mg sublingual every 6 hours as needed cramping and abdominal spasms. 6.  Continue lorazepam 0.5 mg p.o. every 6 hours as needed  anxiety. 7.  Continue Protonix 40 mg p.o. daily for gastric protection. 8.  Continue Crestor 10 mg p.o. daily for hyperlipidemia. 9.  Increase trazodone 200 mg p.o. nightly as needed insomnia. 10.  Disposition planning-in progress.  Sharma Covert, MD 11/24/2018, 2:49 PM

## 2018-11-24 NOTE — Plan of Care (Signed)
Nurse discussed anxiety, depression, coping skills with patient. 

## 2018-11-24 NOTE — Progress Notes (Signed)
   11/23/18 2107  COVID-19 Daily Checkoff  Have you had a fever (temp > 37.80C/100F)  in the past 24 hours?  No  COVID-19 EXPOSURE  Have you traveled outside the state in the past 14 days? No  Have you been in contact with someone with a confirmed diagnosis of COVID-19 or PUI in the past 14 days without wearing appropriate PPE? No  Have you been living in the same home as a person with confirmed diagnosis of COVID-19 or a PUI (household contact)? No  Have you been diagnosed with COVID-19? No

## 2018-11-25 MED ORDER — DULOXETINE HCL 40 MG PO CPEP: 40.0000 mg | ORAL_CAPSULE | Freq: Every day | ORAL | 0 refills | Status: DC

## 2018-11-25 NOTE — Plan of Care (Signed)
D: Patient is alert, in the dayroom, and cooperative. Denies SI, HI, AVH, and verbally contracts for safety. Patient reports he had a better day than yesterday. Patient denies physical symptoms/pain.    A: Medications administered per MD order. Support provided. Patient educated on safety on the unit and medications. Routine safety checks every 15 minutes. Patient stated understanding to tell nurse about any new physical symptoms. Patient understands to tell staff of any needs.     R: No adverse drug reactions noted. Patient verbally contracts for safety. Patient remains safe at this time and will continue to monitor.   Problem: Safety: Goal: Periods of time without injury will increase Outcome: Progressing   Patient remains safe and will continue to monitor.    [place] NOVEL CORONAVIRUS (COVID-19) DAILY CHECK-OFF SYMPTOMS - answer yes or no to each - every day NO YES  Have you had a fever in the past 24 hours?  Fever (Temp > 37.80C / 100F) X   Have you had any of these symptoms in the past 24 hours? New Cough  Sore Throat   Shortness of Breath  Difficulty Breathing  Unexplained Body Aches   X   Have you had any one of these symptoms in the past 24 hours not related to allergies?   Runny Nose  Nasal Congestion  Sneezing   X   If you have had runny nose, nasal congestion, sneezing in the past 24 hours, has it worsened?  X   EXPOSURES - check yes or no X   Have you traveled outside the state in the past 14 days?  X   Have you been in contact with someone with a confirmed diagnosis of COVID-19 or PUI in the past 14 days without wearing appropriate PPE?  X   Have you been living in the same home as a person with confirmed diagnosis of COVID-19 or a PUI (household contact)?    X   Have you been diagnosed with COVID-19?    X              What to do next: Answered NO to all: Answered YES to anything:   Proceed with unit schedule Follow the [facility] Inpatient Flowsheet.

## 2018-11-25 NOTE — Progress Notes (Signed)
The patient shared in group that he felt better and that his mood had improved as well. His goal for tomorrow is to work on his discharge plans.

## 2018-11-25 NOTE — Plan of Care (Signed)
Discharge note  Patient verbalizes readiness for discharge. Follow up plan explained, AVS, Transition record and SRA given. Prescriptions and teaching provided. Belongings returned and signed for. Suicide safety plan completed and signed. Patient verbalizes understanding. Patient denies SI/HI and assures this Probation officer he will seek assistance should that change. Patient discharged to lobby where sheriff was waiting to transport him to [place].  Problem: Education: Goal: Knowledge of [facility] Education information/materials will improve Outcome: Adequate for Discharge Goal: Emotional status will improve Outcome: Adequate for Discharge Goal: Mental status will improve Outcome: Adequate for Discharge Goal: Verbalization of understanding the information provided will improve Outcome: Adequate for Discharge   Problem: Activity: Goal: Interest or engagement in activities will improve Outcome: Adequate for Discharge Goal: Sleeping patterns will improve Outcome: Adequate for Discharge   Problem: Coping: Goal: Ability to verbalize frustrations and anger appropriately will improve Outcome: Adequate for Discharge Goal: Ability to demonstrate self-control will improve Outcome: Adequate for Discharge   Problem: Health Behavior/Discharge Planning: Goal: Identification of resources available to assist in meeting health care needs will improve Outcome: Adequate for Discharge Goal: Compliance with treatment plan for underlying cause of condition will improve Outcome: Adequate for Discharge   Problem: Physical Regulation: Goal: Ability to maintain clinical measurements within normal limits will improve Outcome: Adequate for Discharge   Problem: Safety: Goal: Periods of time without injury will increase Outcome: Adequate for Discharge   Problem: Education: Goal: Ability to make informed decisions regarding treatment will improve Outcome: Adequate for Discharge   Problem:  Coping: Goal: Coping ability will improve Outcome: Adequate for Discharge   Problem: Health Behavior/Discharge Planning: Goal: Identification of resources available to assist in meeting health care needs will improve Outcome: Adequate for Discharge   Problem: Medication: Goal: Compliance with prescribed medication regimen will improve Outcome: Adequate for Discharge   Problem: Self-Concept: Goal: Ability to disclose and discuss suicidal ideas will improve Outcome: Adequate for Discharge Goal: Will verbalize positive feelings about self Outcome: Adequate for Discharge   Problem: Activity: Goal: Will identify at least one activity in which they can participate Outcome: Adequate for Discharge   Problem: Coping: Goal: Ability to identify and develop effective coping behavior will improve Outcome: Adequate for Discharge Goal: Ability to interact with others will improve Outcome: Adequate for Discharge Goal: Demonstration of participation in decision-making regarding own care will improve Outcome: Adequate for Discharge Goal: Ability to use eye contact when communicating with others will improve Outcome: Adequate for Discharge   Problem: Health Behavior/Discharge Planning: Goal: Identification of resources available to assist in meeting health care needs will improve Outcome: Adequate for Discharge   Problem: Self-Concept: Goal: Will verbalize positive feelings about self Outcome: Adequate for Discharge

## 2018-11-25 NOTE — Discharge Summary (Signed)
Physician Discharge Summary Note  Patient:  William Coleman. is an 58 y.o., male MRN:  [id number] DOB:  1960-12-24 Patient phone:  [phone] (home)  Patient address:   [address] [place] [address],  Total Time spent with patient: 15 minutes  Date of Admission:  11/18/2018 Date of Discharge: 11/25/18  Reason for Admission:  suicidal ideation  Principal Problem: MDD (major depressive disorder), recurrent severe, without psychosis ([place]) Discharge Diagnoses: Principal Problem:   MDD (major depressive disorder), recurrent severe, without psychosis ([place]) Active Problems:   OCD (obsessive compulsive disorder)   Past Psychiatric History: History of MDD and OCD with multiple medication trials. Previously admitted to [facility] and discharged on Pamelor. Previous treatment with [facility] but reports this was unhelpful.  Past Medical History:  Past Medical History:  Diagnosis Date  . Anancastic neurosis   . Anginal pain ([place])   . Anxiety, generalized   . Bipolar disorder ([place])   . Chronic diarrhea   . Depression   . Hypertension   . Hypothyroidism   . Obsessive compulsive disorder   . OCD (obsessive compulsive disorder)   . Sleep apnea    patient states it was "marginal", no CPAP    Past Surgical History:  Procedure Laterality Date  . COLONOSCOPY WITH PROPOFOL N/A 02/05/2016   Procedure: COLONOSCOPY WITH PROPOFOL;  Surgeon: [name], MD;  Location: [facility];  Service: Endoscopy;  Laterality: N/A;  . MOUTH SURGERY     orthodontic surgery for underbite  . MYRINGOTOMY WITH TUBE PLACEMENT    . NASAL SEPTUM SURGERY    . POLYPECTOMY N/A 02/05/2016   Procedure: POLYPECTOMY;  Surgeon: [name], MD;  Location: [facility];  Service: Endoscopy;  Laterality: N/A;   Family History:  Family History  Problem Relation Age of Onset  . Depression Mother   . Anxiety disorder Mother   . Congestive Heart Failure Father   . Prostate cancer Father   .  Hypertension Father    Family Psychiatric  History: Mother with anxiety. Social History:  Social History   Substance and Sexual Activity  Alcohol Use Yes   Comment: rarely     Social History   Substance and Sexual Activity  Drug Use No    Social History   Socioeconomic History  . Marital status: Single    Spouse name: Not on file  . Number of children: Not on file  . Years of education: Not on file  . Highest education level: Not on file  Occupational History  . Not on file  Social Needs  . Financial resource strain: Not on file  . Food insecurity    Worry: Not on file    Inability: Not on file  . Transportation needs    Medical: Not on file    Non-medical: Not on file  Tobacco Use  . Smoking status: Never Smoker  . Smokeless tobacco: Never Used  Substance and Sexual Activity  . Alcohol use: Yes    Comment: rarely  . Drug use: No  . Sexual activity: Never    Birth control/protection: Abstinence  Lifestyle  . Physical activity    Days per week: Not on file    Minutes per session: Not on file  . Stress: Not on file  Relationships  . Social Herbalist on phone: Not on file    Gets together: Not on file    Attends religious service: Not on file    Active  member of club or organization: Not on file    Attends meetings of clubs or organizations: Not on file    Relationship status: Not on file  Other Topics Concern  . Not on file  Social History Narrative  . Not on file    Hospital Course:  From admission H&P: [name] is a 58 year old male with history of IBS, GERD, chronic sinusitis, HTN, MDD, and OCD, presenting for treatment of depression with suicidal ideation to walk into traffic. 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 He also reports increasingly severe depression over the last two weeks related to relationship problems with his girlfriend of two years. She broke up with him related to stress from both of  their health problems. He reports history of "bowel and sinus problems" and reports history of extensive testing and scans for both of these problems. He displays obsessive thinking regarding bowels and sinuses on assessment and reports compulsive frequent trips to the bathroom as well as checking locks and door knobs. He reports depression/anxiety have peaked since breakup. He has not taken any psychiatric medications in two years. He reports previous trials of Lexapro, Anafranil, Pamelor, Zoloft, Paxil, Wellbutrin, Prozac, Trintellix, and Abilify, with GI side effects to all these medications. Denies drug or alcohol abuse. UDS negative. BAL <10. Denies psychotic symptoms. Denies SI/HI/AVH.  [name] was admitted for depression with suicidal ideation. He had been off all psychotropic medications for two years. He was started on Cymbalta. He participated in group therapy on the unit. He remained on the [facility] unit for 7 days. He stabilized with medication and therapy. He was discharged on the medications listed below. He has shown improvement with improved mood, affect, sleep, appetite, and interaction. He denies any SI/HI/AVH and contracts for safety. He agrees to follow up at [facility] (see below). He is provided with prescriptions for medications upon discharge. He is discharging home.  Physical Findings: AIMS: Facial and Oral Movements Muscles of Facial Expression: None, normal Lips and Perioral Area: None, normal Jaw: None, normal Tongue: None, normal,Extremity Movements Upper (arms, wrists, hands, fingers): None, normal Lower (legs, knees, ankles, toes): None, normal, Trunk Movements Neck, shoulders, hips: None, normal, Overall Severity Severity of abnormal movements (highest score from questions above): None, normal Incapacitation due to abnormal movements: None, normal Patient's awareness of abnormal movements (rate only patient's report): No Awareness, Dental Status Current problems with teeth  and/or dentures?: No Does patient usually wear dentures?: No  CIWA:  CIWA-Ar Total: 1 COWS:  COWS Total Score: 1  Musculoskeletal: Strength & Muscle Tone: within normal limits Gait & Station: normal Patient leans: N/A  Psychiatric Specialty Exam: Physical Exam  Nursing note and vitals reviewed. Constitutional: He is oriented to person, place, and time. He appears well-developed and well-nourished.  Cardiovascular: Normal rate.  Respiratory: Effort normal.  Neurological: He is alert and oriented to person, place, and time.    Review of Systems  Constitutional: Negative.   Psychiatric/Behavioral: Positive for depression (stable on medication). Negative for hallucinations, substance abuse and suicidal ideas. The patient is not nervous/anxious and does not have insomnia.     Blood pressure (!) 140/92, pulse 87, temperature 97.8 F (36.6 C), resp. rate 16, height 5\' 7"  (1.702 m), weight 64.4 kg, SpO2 100 %.Body mass index is 22.24 kg/m.  See MD's discharge SRA     Have you used any form of tobacco in the last 30 days? (Cigarettes, Smokeless Tobacco, Cigars, and/or Pipes): No  Has this patient used any form of tobacco in the last 30 days? (Cigarettes, Smokeless Tobacco, Cigars, and/or Pipes)  No  Blood Alcohol level:  Lab Results  Component Value Date   ETH <10 11/17/2018   ETH <5 85/46/2703    Metabolic Disorder Labs:  Lab Results  Component Value Date   HGBA1C 5.1 11/14/2015   Lab Results  Component Value Date   PROLACTIN 6.8 11/14/2015   Lab Results  Component Value Date   CHOL 154 06/11/2018   TRIG 84 06/11/2018   HDL 62 06/11/2018   CHOLHDL 2.5 06/11/2018   VLDL 49 (H) 11/14/2015   LDLCALC 75 06/11/2018   LDLCALC 66 07/02/2017    See Psychiatric Specialty Exam and Suicide Risk Assessment completed by Attending Physician prior to discharge.  Discharge destination:  Home  Is patient on multiple antipsychotic therapies at discharge:  No   Has Patient had  three or more failed trials of antipsychotic monotherapy by history:  No  Recommended Plan for Multiple Antipsychotic Therapies: NA  Discharge Instructions    Discharge instructions   Complete by: As directed    Patient is instructed to take all prescribed medications as recommended. Report any side effects or adverse reactions to your outpatient psychiatrist. Patient is instructed to abstain from alcohol and illegal drugs while on prescription medications. In the event of worsening symptoms, patient is instructed to call the crisis hotline, 911, or go to the nearest emergency department for evaluation and treatment.     Allergies as of 11/25/2018      Reactions   Penicillins Hives, Rash   Sulfa Antibiotics Hives   Amoxicillin Hives      Medication List    TAKE these medications     Indication  amLODipine 5 MG tablet Commonly known as: NORVASC Take 1 tablet (5 mg total) by mouth daily.  Indication: High Blood Pressure Disorder   diphenoxylate-atropine 2.5-0.025 MG tablet Commonly known as: Lomotil Take 2 tablets by mouth 4 (four) times daily as needed for diarrhea or loose stools.  Indication: Diarrhea   DULoxetine HCl 40 MG Cpep Take 40 mg by mouth daily. Start taking on: November 26, 2018  Indication: Major Depressive Disorder   Eluxadoline 100 MG Tabs Commonly known as: Viberzi Take 1 tablet (100 mg total) by mouth 2 (two) times daily.  Indication: Irritable Bowel Syndrome   fluticasone 50 MCG/ACT nasal spray Commonly known as: FLONASE Place 2 sprays into both nostrils 2 (two) times a day.  Indication: Allergic Rhinitis   hyoscyamine 0.125 MG Tbdp disintergrating tablet Commonly known as: ANASPAZ Place 1 tablet (0.125 mg total) under the tongue every 4 (four) hours as needed.  Indication: Bladder Spasm   multivitamin with minerals Tabs tablet Take 1 tablet by mouth daily.  Indication: Supplementation   omeprazole 20 MG capsule Commonly known as:  PRILOSEC Take 20 mg by mouth daily.  Indication: Gastroesophageal Reflux Disease   rosuvastatin 10 MG tablet Commonly known as: Crestor Take 1 tablet (10 mg total) by mouth daily.  Indication: High Amount of Fats in the Blood   vitamin C 100 MG tablet Take 100 mg by mouth daily.  Indication: Supplementation      Follow-up Information    [facility] Follow up on 12/02/2018.   Why: Hospital discharge appointment is Wednesday, 6/24 at 2:30p.  Please bring your current medications and discharge paperwork from this hospitalzation.  Contact information: [place] [address] [phone]  Follow-up recommendations: Activity as tolerated. Diet as recommended by primary care physician. Keep all scheduled follow-up appointments as recommended.   Comments:   Patient is instructed to take all prescribed medications as recommended. Report any side effects or adverse reactions to your outpatient psychiatrist. Patient is instructed to abstain from alcohol and illegal drugs while on prescription medications. In the event of worsening symptoms, patient is instructed to call the crisis hotline, 911, or go to the nearest emergency department for evaluation and treatment.  Signed: Connye Burkitt, NP 11/25/2018, 9:56 AM

## 2018-11-25 NOTE — BHH Suicide Risk Assessment (Signed)
[facility] Discharge Suicide Risk Assessment   Principal Problem: MDD (major depressive disorder), recurrent severe, without psychosis ([place]) Discharge Diagnoses: Principal Problem:   MDD (major depressive disorder), recurrent severe, without psychosis ([place]) Active Problems:   OCD (obsessive compulsive disorder)   Total Time spent with patient: 15 minutes  Musculoskeletal: Strength & Muscle Tone: within normal limits Gait & Station: normal Patient leans: N/A  Psychiatric Specialty Exam: Review of Systems  Gastrointestinal: Positive for constipation.  All other systems reviewed and are negative.   Blood pressure (!) 140/92, pulse 87, temperature 97.8 F (36.6 C), resp. rate 16, height 5\' 7"  (1.702 m), weight 64.4 kg, SpO2 100 %.Body mass index is 22.24 kg/m.  General Appearance: Casual  Eye Contact::  Fair  Speech:  Normal Rate409  Volume:  Normal  Mood:  Anxious  Affect:  Congruent  Thought Process:  Coherent and Descriptions of Associations: Intact  Orientation:  Full (Time, Place, and Person)  Thought Content:  Logical  Suicidal Thoughts:  No  Homicidal Thoughts:  No  Memory:  Immediate;   Good Recent;   Good Remote;   Good  Judgement:  Intact  Insight:  Fair  Psychomotor Activity:  Increased  Concentration:  Fair  Recall:  Good  Fund of Knowledge:Good  Language: Good  Akathisia:  Negative  Handed:  Right  AIMS (if indicated):     Assets:  Desire for Improvement Resilience  Sleep:  Number of Hours: 6.5  Cognition: WNL  ADL's:  Intact   Mental Status Per Nursing Assessment::   On Admission:  Suicidal ideation indicated by patient  Demographic Factors:  Male, Caucasian and Living alone  Loss Factors: NA  Historical Factors: Impulsivity  Risk Reduction Factors:   Employed and Positive therapeutic relationship  Continued Clinical Symptoms:  Obsessive-Compulsive Disorder  Cognitive Features That Contribute To Risk:  None    Suicide Risk:  Minimal: No  identifiable suicidal ideation.  Patients presenting with no risk factors but with morbid ruminations; may be classified as minimal risk based on the severity of the depressive symptoms  Follow-up Information    [facility] Follow up on 12/02/2018.   Why: Hospital discharge appointment is Wednesday, 6/24 at 2:30p.  Please bring your current medications and discharge paperwork from this hospitalzation.  Contact information: [place] [address] [phone]           Plan Of Care/Follow-up recommendations:  Activity:  ad lib  Sharma Covert, MD 11/25/2018, 9:40 AM

## 2018-11-25 NOTE — BHH Group Notes (Signed)
Occupational Therapy Group Note  Date:  11/25/2018 Time:  12:13 PM  Group Topic/Focus:  Self Esteem Action Plan:   The focus of this group is to help patients create a plan to continue to build self-esteem after discharge.  Participation Level:  Active  Participation Quality:  Appropriate  Affect:  Flat  Cognitive:  Alert  Insight: Lacking  Engagement in Group:  Engaged  Modes of Intervention:  Activity, Discussion, Education and Socialization  Additional Comments:    S: "Job loss and other bad things can decrease self esteem"  O: OT tx with focus on self esteem building this date. Education given on definition of self esteem, with both causes of low and high self esteem identified. Activity given for pt to identify a positive/aspiring trait for each letter of the alphabet. Pt to work with peers to help complete activity and build positive thinking.   A: Pt presents with flat affect, engaged and participatory throughout session. Pt shares how job loss and negative situations can decrease self esteem. He shares how positive interactions can increase self esteem. He completed A-Z activity at 75% completion, shared with group with mild improvement in affect.  P: OT group will be x1 per week while pt inpatient.   Zenovia Jarred, MSOT, OTR/L Behavioral Health OT/ Acute Relief OT PHP Office: [place] 11/25/2018, 12:13 PM

## 2018-12-01 NOTE — Discharge Planning (Signed)
[facility] to resend Cymbalta prescription 12/01/18 8:30 am

## 2018-12-01 NOTE — Discharge Planning (Signed)
I called and spoke with [facility] pharmacy again due to patient's repeated phone calls that his Cymbalta rx was not received. Pharmacy staff member reported they have the Cymbalta prescription but were out of Cymbalta in their pharmacy. They will be getting the Cymbalta in tomorrow and have let the patient know.

## 2018-12-02 DIAGNOSIS — F331 Major depressive disorder, recurrent, moderate: Secondary | ICD-10-CM | POA: Diagnosis not present

## 2018-12-04 DIAGNOSIS — M6289 Other specified disorders of muscle: Secondary | ICD-10-CM | POA: Diagnosis not present

## 2018-12-04 DIAGNOSIS — K529 Noninfective gastroenteritis and colitis, unspecified: Secondary | ICD-10-CM | POA: Diagnosis not present

## 2018-12-04 DIAGNOSIS — R159 Full incontinence of feces: Secondary | ICD-10-CM | POA: Diagnosis not present

## 2018-12-15 DIAGNOSIS — R490 Dysphonia: Secondary | ICD-10-CM | POA: Diagnosis not present

## 2018-12-15 DIAGNOSIS — Z6822 Body mass index (BMI) 22.0-22.9, adult: Secondary | ICD-10-CM | POA: Diagnosis not present

## 2018-12-21 DIAGNOSIS — M6289 Other specified disorders of muscle: Secondary | ICD-10-CM | POA: Diagnosis not present

## 2018-12-21 DIAGNOSIS — R159 Full incontinence of feces: Secondary | ICD-10-CM | POA: Diagnosis not present

## 2018-12-21 DIAGNOSIS — K529 Noninfective gastroenteritis and colitis, unspecified: Secondary | ICD-10-CM | POA: Diagnosis not present

## 2018-12-28 DIAGNOSIS — R159 Full incontinence of feces: Secondary | ICD-10-CM | POA: Diagnosis not present

## 2018-12-28 DIAGNOSIS — M6289 Other specified disorders of muscle: Secondary | ICD-10-CM | POA: Diagnosis not present

## 2018-12-28 DIAGNOSIS — K529 Noninfective gastroenteritis and colitis, unspecified: Secondary | ICD-10-CM | POA: Diagnosis not present

## 2019-01-13 ENCOUNTER — Telehealth: Payer: Self-pay | Admitting: Family Medicine

## 2019-01-13 NOTE — Telephone Encounter (Signed)
Pt needing a medicine filled by Dr. Rosanna Randy.  His physiatrist Dr. Encarnacion Chu passed away and is having issues with getting him in with a new one. The soonest would be Aug 28th.  However, pt cannot get his Cymbalta  - Generic 40 mg - (Ran out in late July).  Just needing enough to last until Aug 28th.  Please call pt back to let him know [phone].  Pt uses: [facility], [address] [phone] (Phone) [phone] (Fax)   Thanks, [facility]

## 2019-01-13 NOTE — Telephone Encounter (Signed)
Please review. Thanks!  

## 2019-01-13 NOTE — Telephone Encounter (Signed)
Ok to send in 1 month supply.

## 2019-01-14 MED ORDER — DULOXETINE HCL 40 MG PO CPEP: 40.0000 mg | ORAL_CAPSULE | Freq: Every day | ORAL | 0 refills | Status: DC

## 2019-01-14 NOTE — Telephone Encounter (Signed)
RX sent   Thanks,   -Shandale Malak  

## 2019-01-18 DIAGNOSIS — R159 Full incontinence of feces: Secondary | ICD-10-CM | POA: Diagnosis not present

## 2019-01-18 DIAGNOSIS — M6289 Other specified disorders of muscle: Secondary | ICD-10-CM | POA: Diagnosis not present

## 2019-01-18 DIAGNOSIS — K529 Noninfective gastroenteritis and colitis, unspecified: Secondary | ICD-10-CM | POA: Diagnosis not present

## 2019-02-05 DIAGNOSIS — F331 Major depressive disorder, recurrent, moderate: Secondary | ICD-10-CM | POA: Diagnosis not present

## 2019-02-09 DIAGNOSIS — R490 Dysphonia: Secondary | ICD-10-CM | POA: Diagnosis not present

## 2019-02-09 DIAGNOSIS — J3089 Other allergic rhinitis: Secondary | ICD-10-CM | POA: Diagnosis not present

## 2019-02-09 DIAGNOSIS — Z6822 Body mass index (BMI) 22.0-22.9, adult: Secondary | ICD-10-CM | POA: Diagnosis not present

## 2019-02-09 DIAGNOSIS — J310 Chronic rhinitis: Secondary | ICD-10-CM | POA: Diagnosis not present

## 2019-03-18 DIAGNOSIS — M6289 Other specified disorders of muscle: Secondary | ICD-10-CM | POA: Diagnosis not present

## 2019-03-22 ENCOUNTER — Other Ambulatory Visit: Payer: Self-pay | Admitting: Family Medicine

## 2019-03-30 DIAGNOSIS — J310 Chronic rhinitis: Secondary | ICD-10-CM | POA: Diagnosis not present

## 2019-04-05 ENCOUNTER — Other Ambulatory Visit: Payer: Self-pay | Admitting: Family Medicine

## 2019-04-05 DIAGNOSIS — E782 Mixed hyperlipidemia: Secondary | ICD-10-CM

## 2019-04-05 DIAGNOSIS — I10 Essential (primary) hypertension: Secondary | ICD-10-CM

## 2019-04-26 DIAGNOSIS — F331 Major depressive disorder, recurrent, moderate: Secondary | ICD-10-CM | POA: Diagnosis not present

## 2019-04-27 DIAGNOSIS — J329 Chronic sinusitis, unspecified: Secondary | ICD-10-CM | POA: Diagnosis not present

## 2019-04-27 DIAGNOSIS — I10 Essential (primary) hypertension: Secondary | ICD-10-CM | POA: Diagnosis not present

## 2019-04-27 DIAGNOSIS — R490 Dysphonia: Secondary | ICD-10-CM | POA: Diagnosis not present

## 2019-04-27 DIAGNOSIS — J310 Chronic rhinitis: Secondary | ICD-10-CM | POA: Diagnosis not present

## 2019-04-27 DIAGNOSIS — J3089 Other allergic rhinitis: Secondary | ICD-10-CM | POA: Diagnosis not present

## 2019-04-27 DIAGNOSIS — E7800 Pure hypercholesterolemia, unspecified: Secondary | ICD-10-CM | POA: Diagnosis not present

## 2019-04-27 DIAGNOSIS — G4733 Obstructive sleep apnea (adult) (pediatric): Secondary | ICD-10-CM | POA: Diagnosis not present

## 2019-05-24 DIAGNOSIS — F331 Major depressive disorder, recurrent, moderate: Secondary | ICD-10-CM | POA: Diagnosis not present

## 2019-05-26 ENCOUNTER — Ambulatory Visit: Payer: BLUE CROSS/BLUE SHIELD | Attending: Internal Medicine

## 2019-05-26 ENCOUNTER — Other Ambulatory Visit: Payer: Self-pay

## 2019-05-26 DIAGNOSIS — Z20828 Contact with and (suspected) exposure to other viral communicable diseases: Secondary | ICD-10-CM | POA: Diagnosis not present

## 2019-05-26 DIAGNOSIS — Z20822 Contact with and (suspected) exposure to covid-19: Secondary | ICD-10-CM

## 2019-05-28 LAB — NOVEL CORONAVIRUS, NAA: SARS-CoV-2, NAA: NOT DETECTED

## 2019-06-01 DIAGNOSIS — Z6822 Body mass index (BMI) 22.0-22.9, adult: Secondary | ICD-10-CM | POA: Diagnosis not present

## 2019-06-01 DIAGNOSIS — J310 Chronic rhinitis: Secondary | ICD-10-CM | POA: Diagnosis not present

## 2019-06-16 DIAGNOSIS — K529 Noninfective gastroenteritis and colitis, unspecified: Secondary | ICD-10-CM | POA: Diagnosis not present

## 2019-06-16 DIAGNOSIS — R159 Full incontinence of feces: Secondary | ICD-10-CM | POA: Diagnosis not present

## 2019-06-16 DIAGNOSIS — Z6822 Body mass index (BMI) 22.0-22.9, adult: Secondary | ICD-10-CM | POA: Diagnosis not present

## 2019-06-23 DIAGNOSIS — F331 Major depressive disorder, recurrent, moderate: Secondary | ICD-10-CM | POA: Diagnosis not present

## 2019-07-12 DIAGNOSIS — F331 Major depressive disorder, recurrent, moderate: Secondary | ICD-10-CM | POA: Diagnosis not present

## 2019-07-26 DIAGNOSIS — G8929 Other chronic pain: Secondary | ICD-10-CM | POA: Diagnosis not present

## 2019-07-26 DIAGNOSIS — K6289 Other specified diseases of anus and rectum: Secondary | ICD-10-CM | POA: Diagnosis not present

## 2019-07-26 DIAGNOSIS — R151 Fecal smearing: Secondary | ICD-10-CM | POA: Diagnosis not present

## 2019-07-26 DIAGNOSIS — R194 Change in bowel habit: Secondary | ICD-10-CM | POA: Diagnosis not present

## 2019-07-28 ENCOUNTER — Other Ambulatory Visit: Payer: Self-pay

## 2019-07-28 ENCOUNTER — Ambulatory Visit (INDEPENDENT_AMBULATORY_CARE_PROVIDER_SITE_OTHER): Payer: BC Managed Care – PPO | Admitting: Family Medicine

## 2019-07-28 ENCOUNTER — Encounter: Payer: Self-pay | Admitting: Family Medicine

## 2019-07-28 VITALS — BP 120/82 | HR 86 | Temp 97.1°F | Resp 16 | Wt 154.0 lb

## 2019-07-28 DIAGNOSIS — I82563 Chronic embolism and thrombosis of calf muscular vein, bilateral: Secondary | ICD-10-CM

## 2019-07-28 DIAGNOSIS — F429 Obsessive-compulsive disorder, unspecified: Secondary | ICD-10-CM | POA: Diagnosis not present

## 2019-07-28 DIAGNOSIS — L03119 Cellulitis of unspecified part of limb: Secondary | ICD-10-CM | POA: Diagnosis not present

## 2019-07-28 MED ORDER — DOXYCYCLINE HYCLATE 100 MG PO TABS: 100.0000 mg | ORAL_TABLET | Freq: Two times a day (BID) | ORAL | 0 refills | Status: DC

## 2019-07-28 NOTE — Progress Notes (Signed)
Patient: William Coleman. Male    DOB: 1960/07/18   59 y.o.   MRN: [id number] Visit Date: 07/28/2019  Today's Provider: Wilhemena Durie, MD   Chief Complaint  Patient presents with  . Leg Swelling   Subjective:     HPI Patient comes in office today to address symptoms of edema in his lower extremities for over 2 months, patient reports that as soon as swelling began rash formed in his lower leg. Patient reports that he has been cleaning his skin with soap and water, he states that rash is mildly itchy and red.  He is having no systemic symptoms, no fever or chills.  No drainage from the rash.  Rash is on the anterior part of both lower extremities below the knees but above the ankles.  Does not appear to be excoriations.  No weeping no warmth and very minimal tenderness.  Patient has a long history of psychiatric illness, especially OCD. He has had OCD issues regarding GI and sinus issues in the past.  Has seen multiple specialist for these issues. Allergies  Allergen Reactions  . Penicillins Hives and Rash  . Sulfa Antibiotics Hives  . Amoxicillin Hives     Current Outpatient Medications:  .  amLODipine (NORVASC) 5 MG tablet, TAKE 1 TABLET (5 MG TOTAL) BY MOUTH DAILY., Disp: 90 tablet, Rfl: 3 .  Ascorbic Acid (VITAMIN C) 100 MG tablet, Take 100 mg by mouth daily., Disp: , Rfl:  .  diphenoxylate-atropine (LOMOTIL) 2.5-0.025 MG tablet, Take 2 tablets by mouth 4 (four) times daily as needed for diarrhea or loose stools., Disp: 240 tablet, Rfl: 5 .  DULoxetine (CYMBALTA) 30 MG capsule, Take 30 mg by mouth daily., Disp: , Rfl:  .  DULoxetine HCl 40 MG CPEP, Take 40 mg by mouth daily., Disp: 30 capsule, Rfl: 0 .  Eluxadoline (VIBERZI) 100 MG TABS, Take 1 tablet (100 mg total) by mouth 2 (two) times daily., Disp: 60 tablet, Rfl: 5 .  fluticasone (FLONASE) 50 MCG/ACT nasal spray, Place 2 sprays into both nostrils 2 (two) times a day. , Disp: , Rfl:  .  hyoscyamine  (ANASPAZ) 0.125 MG TBDP disintergrating tablet, Place 1 tablet (0.125 mg total) under the tongue every 4 (four) hours as needed., Disp: 180 tablet, Rfl: 1 .  hyoscyamine (LEVSIN SL) 0.125 MG SL tablet, PLACE 1 TABLET UNDER THE TONGUE EVERY 4 HOURS AS NEEDED., Disp: 180 tablet, Rfl: 1 .  Hyoscyamine Sulfate 0.375 MG TBCR, , Disp: , Rfl:  .  Multiple Vitamin (MULTIVITAMIN WITH MINERALS) TABS tablet, Take 1 tablet by mouth daily., Disp: , Rfl:  .  omeprazole (PRILOSEC) 20 MG capsule, Take 20 mg by mouth daily., Disp: , Rfl:  .  rosuvastatin (CRESTOR) 10 MG tablet, TAKE 1 TABLET (10 MG TOTAL) BY MOUTH DAILY., Disp: 90 tablet, Rfl: 3 .  sildenafil (VIAGRA) 50 MG tablet, , Disp: , Rfl:   Review of Systems  Constitutional: Negative for appetite change, chills and fever.  HENT: Negative.   Eyes: Negative.   Respiratory: Negative for chest tightness, shortness of breath and wheezing.   Cardiovascular: Positive for leg swelling. Negative for chest pain and palpitations.  Gastrointestinal: Negative for abdominal pain, nausea and vomiting.  Endocrine: Negative.   Musculoskeletal: Positive for joint swelling.  Skin: Positive for rash.  Allergic/Immunologic: Negative.   Neurological: Negative.   Hematological: Negative.   Psychiatric/Behavioral: The patient is nervous/anxious.     Social History  Tobacco Use  . Smoking status: Never Smoker  . Smokeless tobacco: Never Used  Substance Use Topics  . Alcohol use: Yes    Comment: rarely      Objective:   BP 120/82   Pulse 86   Temp (!) 97.1 F (36.2 C) (Oral)   Resp 16   Wt 154 lb (69.9 kg)   BMI 24.12 kg/m  Vitals:   07/28/19 1617  BP: 120/82  Pulse: 86  Resp: 16  Temp: (!) 97.1 F (36.2 C)  TempSrc: Oral  Weight: 154 lb (69.9 kg)  Body mass index is 24.12 kg/m.   Physical Exam Vitals reviewed.  HENT:     Head: Normocephalic and atraumatic.     Right Ear: External ear normal.     Left Ear: External ear normal.  Eyes:      General: No scleral icterus.    Conjunctiva/sclera: Conjunctivae normal.  Cardiovascular:     Rate and Rhythm: Normal rate and regular rhythm.     Heart sounds: Normal heart sounds.  Pulmonary:     Breath sounds: Normal breath sounds.  Musculoskeletal:     Right lower leg: Edema present.     Left lower leg: Edema present.     Comments: 2+ pedal edema and 1+ edema to the knees. Left calf is 14.5 inches and right calf is 15.5.  Skin:    General: Skin is warm and dry.     Comments: Erythematous and eczematous appearing rash in the anterior legs of both lower extremities.  It does not extend to the posterior part of the leg.  Do not see excoriations today.  There is no obvious wound or weeping.  Neurological:     General: No focal deficit present.     Mental Status: He is alert and oriented to person, place, and time.  Psychiatric:        Mood and Affect: Mood normal.        Behavior: Behavior normal.        Thought Content: Thought content normal.        Judgment: Judgment normal.      No results found for any visits on 07/28/19.     Assessment & Plan    1. Cellulitis of lower extremity, unspecified laterality I am not sure this is infection but we will treat it as such and see how he is doing in a few days.  May need dermatology referral. - [place] Lower Ext Art Bilat; Future - doxycycline (VIBRA-TABS) 100 MG tablet; Take 1 tablet (100 mg total) by mouth 2 (two) times daily.  Dispense: 20 tablet; Refill: 0  2. 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  Next week will explore other issues that can cause lower extremity edema.  Must consider heart failure, liver failure, etc.  No signs of these today.  Not think he has any habits that would lead him to these problems at 27. - VAS [place] LOWER EXTREMITY VENOUS (DVT); Future  3. Obsessive-compulsive disorder, unspecified type Major issue  for this patient through the years.      Richard Cranford Mon, MD  [facility]

## 2019-07-30 DIAGNOSIS — R159 Full incontinence of feces: Secondary | ICD-10-CM | POA: Diagnosis not present

## 2019-07-30 DIAGNOSIS — K529 Noninfective gastroenteritis and colitis, unspecified: Secondary | ICD-10-CM | POA: Diagnosis not present

## 2019-07-30 DIAGNOSIS — M6289 Other specified disorders of muscle: Secondary | ICD-10-CM | POA: Diagnosis not present

## 2019-08-05 ENCOUNTER — Other Ambulatory Visit: Payer: Self-pay | Admitting: Family Medicine

## 2019-08-05 ENCOUNTER — Other Ambulatory Visit: Payer: Self-pay

## 2019-08-05 ENCOUNTER — Ambulatory Visit: Payer: BC Managed Care – PPO | Admitting: Family Medicine

## 2019-08-05 ENCOUNTER — Encounter: Payer: Self-pay | Admitting: Family Medicine

## 2019-08-05 VITALS — BP 127/78 | HR 92 | Temp 97.3°F | Resp 16 | Ht 67.0 in | Wt 154.8 lb

## 2019-08-05 DIAGNOSIS — F429 Obsessive-compulsive disorder, unspecified: Secondary | ICD-10-CM

## 2019-08-05 DIAGNOSIS — L03119 Cellulitis of unspecified part of limb: Secondary | ICD-10-CM | POA: Diagnosis not present

## 2019-08-05 DIAGNOSIS — M6289 Other specified disorders of muscle: Secondary | ICD-10-CM

## 2019-08-05 DIAGNOSIS — E039 Hypothyroidism, unspecified: Secondary | ICD-10-CM

## 2019-08-05 DIAGNOSIS — L309 Dermatitis, unspecified: Secondary | ICD-10-CM

## 2019-08-05 DIAGNOSIS — M7989 Other specified soft tissue disorders: Secondary | ICD-10-CM

## 2019-08-05 MED ORDER — TRIAMCINOLONE ACETONIDE 0.5 % EX CREA: 1.0000 "application " | TOPICAL_CREAM | Freq: Two times a day (BID) | CUTANEOUS | 0 refills | Status: DC

## 2019-08-05 NOTE — Patient Instructions (Signed)
Stop Amlodipine 

## 2019-08-05 NOTE — Progress Notes (Signed)
Patient: William Coleman. Male    DOB: [birth year]   59 y.o.   MRN: [id number] Visit Date: 08/05/2019  Today's Provider: Wilhemena Durie, MD   Chief Complaint  Patient presents with  . Follow-up    Cellulitis of lower extremity   Subjective:    I Malgorzata Albert S. Patria Warzecha, CMA, am acting as scribe for Wilhemena Durie, MD.  HPI Follow up for cellulitis of lower extremity The patient was last seen for this 1 weeks ago. Changes made at last visit include start Doxycycline Hyclate 100 mg BID.  He reports excellent compliance with treatment. He feels that condition is Unchanged. Patient reports still having swelling, left worse than right. Patient reports swelling gets worse through out the day. He is not having side effects.   Patient states the swelling started last summer.  The rash since then.  He is not any better on the antibiotic.  He continues to bring up his bowel issues. ------------------------------------------------------------------------------------   Allergies  Allergen Reactions  . Penicillins Hives and Rash  . Sulfa Antibiotics Hives  . Amoxicillin Hives     Current Outpatient Medications:  .  amLODipine (NORVASC) 5 MG tablet, TAKE 1 TABLET (5 MG TOTAL) BY MOUTH DAILY., Disp: 90 tablet, Rfl: 3 .  Ascorbic Acid (VITAMIN C) 100 MG tablet, Take 100 mg by mouth daily., Disp: , Rfl:  .  diphenoxylate-atropine (LOMOTIL) 2.5-0.025 MG tablet, Take 2 tablets by mouth 4 (four) times daily as needed for diarrhea or loose stools., Disp: 240 tablet, Rfl: 5 .  doxycycline (VIBRA-TABS) 100 MG tablet, Take 1 tablet (100 mg total) by mouth 2 (two) times daily., Disp: 20 tablet, Rfl: 0 .  DULoxetine (CYMBALTA) 30 MG capsule, Take 30 mg by mouth daily., Disp: , Rfl:  .  Eluxadoline (VIBERZI) 100 MG TABS, Take 1 tablet (100 mg total) by mouth 2 (two) times daily., Disp: 60 tablet, Rfl: 5 .  fluticasone (FLONASE) 50 MCG/ACT nasal spray, Place 2 sprays into both  nostrils 2 (two) times a day. , Disp: , Rfl:  .  hyoscyamine (ANASPAZ) 0.125 MG TBDP disintergrating tablet, Place 1 tablet (0.125 mg total) under the tongue every 4 (four) hours as needed., Disp: 180 tablet, Rfl: 1 .  Multiple Vitamin (MULTIVITAMIN WITH MINERALS) TABS tablet, Take 1 tablet by mouth daily., Disp: , Rfl:  .  omeprazole (PRILOSEC) 20 MG capsule, Take 20 mg by mouth daily., Disp: , Rfl:  .  rosuvastatin (CRESTOR) 10 MG tablet, TAKE 1 TABLET (10 MG TOTAL) BY MOUTH DAILY., Disp: 90 tablet, Rfl: 3 .  sildenafil (VIAGRA) 50 MG tablet, , Disp: , Rfl:  .  DULoxetine HCl 40 MG CPEP, Take 40 mg by mouth daily., Disp: 30 capsule, Rfl: 0 .  hyoscyamine (LEVSIN SL) 0.125 MG SL tablet, PLACE 1 TABLET UNDER THE TONGUE EVERY 4 HOURS AS NEEDED., Disp: 180 tablet, Rfl: 1 .  Hyoscyamine Sulfate 0.375 MG TBCR, , Disp: , Rfl:   Review of Systems  Constitutional: Negative for chills, fatigue and fever.  Eyes: Negative.   Respiratory: Negative for cough, shortness of breath and wheezing.   Cardiovascular: Positive for leg swelling. Negative for chest pain and palpitations.  Gastrointestinal:       Known chronic GI complaints of loose stool.  Endocrine: Negative.   Musculoskeletal: Negative for myalgias.  Skin: Positive for color change and rash.       Erythematous scaly nontender rash on both anterior lower legs.  I am not so sure these do not start out as excoriations.  Allergic/Immunologic: Negative.   Psychiatric/Behavioral: Positive for agitation.       Normal for this patient.    Social History   Tobacco Use  . Smoking status: Never Smoker  . Smokeless tobacco: Never Used  Substance Use Topics  . Alcohol use: Yes    Comment: rarely      Objective:   BP 127/78 (BP Location: Right Arm, Patient Position: Sitting, Cuff Size: Large)   Pulse 92   Temp (!) 97.3 F (36.3 C) (Temporal)   Resp 16   Ht 5\' 7"  (1.702 m)   Wt 154 lb 12.8 oz (70.2 kg)   BMI 24.25 kg/m  Vitals:    08/05/19 1557  BP: 127/78  Pulse: 92  Resp: 16  Temp: (!) 97.3 F (36.3 C)  TempSrc: Temporal  Weight: 154 lb 12.8 oz (70.2 kg)  Height: 5\' 7"  (1.702 m)  Body mass index is 24.25 kg/m.   Physical Exam Vitals reviewed.  HENT:     Head: Normocephalic and atraumatic.     Right Ear: External ear normal.     Left Ear: External ear normal.  Eyes:     General: No scleral icterus.    Conjunctiva/sclera: Conjunctivae normal.  Cardiovascular:     Rate and Rhythm: Normal rate and regular rhythm.     Heart sounds: Normal heart sounds.  Pulmonary:     Breath sounds: Normal breath sounds.  Musculoskeletal:     Right lower leg: Edema present.     Left lower leg: Edema present.     Comments: 2+ pedal edema and 1+ edema to the knees. Left calf is 14.5 inches and right calf is 15.5.  Skin:    General: Skin is warm and dry.     Comments: Erythematous and eczematous appearing rash in the anterior legs of both lower extremities.  It does not extend to the posterior part of the leg.  Do not see excoriations today.  There is no obvious wound or weeping. I am not sure these did not start out however is excoriations.  Neurological:     General: No focal deficit present.     Mental Status: He is alert and oriented to person, place, and time.  Psychiatric:        Mood and Affect: Mood normal.        Behavior: Behavior normal.        Thought Content: Thought content normal.        Judgment: Judgment normal.      No results found for any visits on 08/05/19.     Assessment & Plan    1. Cellulitis of lower extremity, unspecified laterality I do not at all believe this is cellulitis now.  Patient denies alcohol intake or any other drug use.  He denies any use of any medications other than those prescribed to them.  Kidney and liver function/baseline labs. - CBC with Differential/Platelet - Comprehensive metabolic panel - triamcinolone cream (KENALOG) 0.5 %; Apply 1 application topically 2  (two) times daily. Apply to legs  Dispense: 30 g; Refill: 0  2. Adult hypothyroidism  - TSH  3. Obsessive-compulsive disorder, unspecified type Major issue for this patient.  4. Dermatitis Treat with triamcinolone cream.  Return to clinic 2 weeks.  May need dermatology referral.  5. Leg swelling Stop amlodipine and reassess in 2 weeks.  May need vascular referral if all labs are normal and no  improvement  6. Pelvic floor dysfunction Has treatment plan per GI.     Richard Cranford Mon, MD  [facility]

## 2019-08-06 ENCOUNTER — Ambulatory Visit
Admission: RE | Admit: 2019-08-06 | Discharge: 2019-08-06 | Disposition: A | Discharge status: Home / Self Care | Payer: BC Managed Care – PPO | Source: Ambulatory Visit | Attending: Family Medicine | Admitting: Family Medicine

## 2019-08-06 ENCOUNTER — Other Ambulatory Visit: Payer: Self-pay

## 2019-08-06 DIAGNOSIS — L03115 Cellulitis of right lower limb: Secondary | ICD-10-CM | POA: Diagnosis not present

## 2019-08-06 DIAGNOSIS — L03116 Cellulitis of left lower limb: Secondary | ICD-10-CM | POA: Diagnosis not present

## 2019-08-06 DIAGNOSIS — L03119 Cellulitis of unspecified part of limb: Secondary | ICD-10-CM | POA: Diagnosis not present

## 2019-08-06 LAB — CBC WITH DIFFERENTIAL/PLATELET
Basophils Absolute: 0.1 10*3/uL (ref 0.0–0.2)
Basos: 1 %
EOS (ABSOLUTE): 0.4 10*3/uL (ref 0.0–0.4)
Eos: 5 %
Hematocrit: 43.1 % (ref 37.5–51.0)
Hemoglobin: 15 g/dL (ref 13.0–17.7)
Immature Grans (Abs): 0 10*3/uL (ref 0.0–0.1)
Immature Granulocytes: 0 %
Lymphocytes Absolute: 1.7 10*3/uL (ref 0.7–3.1)
Lymphs: 22 %
MCH: 31.3 pg (ref 26.6–33.0)
MCHC: 34.8 g/dL (ref 31.5–35.7)
MCV: 90 fL (ref 79–97)
Monocytes Absolute: 0.7 10*3/uL (ref 0.1–0.9)
Monocytes: 9 %
Neutrophils Absolute: 4.8 10*3/uL (ref 1.4–7.0)
Neutrophils: 63 %
Platelets: 300 10*3/uL (ref 150–450)
RBC: 4.79 x10E6/uL (ref 4.14–5.80)
RDW: 12.5 % (ref 11.6–15.4)
WBC: 7.7 10*3/uL (ref 3.4–10.8)

## 2019-08-06 LAB — COMPREHENSIVE METABOLIC PANEL
ALT: 18 IU/L (ref 0–44)
AST: 26 IU/L (ref 0–40)
Albumin/Globulin Ratio: 1.9 (ref 1.2–2.2)
Albumin: 4.6 g/dL (ref 3.8–4.9)
Alkaline Phosphatase: 75 IU/L (ref 39–117)
BUN/Creatinine Ratio: 19 (ref 9–20)
BUN: 18 mg/dL (ref 6–24)
Bilirubin Total: 0.5 mg/dL (ref 0.0–1.2)
CO2: 25 mmol/L (ref 20–29)
Calcium: 9.7 mg/dL (ref 8.7–10.2)
Chloride: 98 mmol/L (ref 96–106)
Creatinine, Ser: 0.96 mg/dL (ref 0.76–1.27)
GFR calc Af Amer: 101 mL/min/{1.73_m2} (ref >59)
GFR calc non Af Amer: 87 mL/min/{1.73_m2} (ref >59)
Globulin, Total: 2.4 g/dL (ref 1.5–4.5)
Glucose: 81 mg/dL (ref 65–99)
Potassium: 3.5 mmol/L (ref 3.5–5.2)
Sodium: 137 mmol/L (ref 134–144)
Total Protein: 7 g/dL (ref 6.0–8.5)

## 2019-08-06 LAB — TSH: TSH: 1.16 u[IU]/mL (ref 0.450–4.500)

## 2019-08-12 ENCOUNTER — Ambulatory Visit: Payer: Self-pay | Admitting: Family Medicine

## 2019-08-18 NOTE — Progress Notes (Signed)
Patient: William Coleman. Male    DOB: 07-16-1960   59 y.o.   MRN: [id number] Visit Date: 08/19/2019  Today's Provider: Wilhemena Durie, MD   Chief Complaint  Patient presents with  . Follow-up  . Hypertension  . Cellulitis   Subjective:     HPI  Everything is improving is up is chronic problem of urgency with defecation. Cellulitis of lower extremity, unspecified laterality Presently no sign of infection. From 08/05/2019-given rx for triamcinolone cream (KENALOG) 0.5 %. Labs checked showing-normal limits. Patient reports the swelling in his legs has improved since last visit. The rash is unchanged, but he has not been using the cream as much.  Dermatitis From 08/05/2019-Treat with triamcinolone cream.  Return to clinic 2 weeks.  May need dermatology referral.  Leg swelling From 08/05/2019-Stopped amlodipine and reassess in 2 weeks.  May need vascular referral if all labs are normal and no improvement.  Patient reports good compliance. He states the leg swelling has improved since stopping Amlodipine.    Allergies  Allergen Reactions  . Penicillins Hives and Rash  . Sulfa Antibiotics Hives  . Amoxicillin Hives     Current Outpatient Medications:  .  Ascorbic Acid (VITAMIN C) 100 MG tablet, Take 100 mg by mouth daily., Disp: , Rfl:  .  diphenoxylate-atropine (LOMOTIL) 2.5-0.025 MG tablet, Take 2 tablets by mouth 4 (four) times daily as needed for diarrhea or loose stools., Disp: 240 tablet, Rfl: 5 .  DULoxetine (CYMBALTA) 30 MG capsule, Take 30 mg by mouth daily., Disp: , Rfl:  .  Eluxadoline (VIBERZI) 100 MG TABS, Take 1 tablet (100 mg total) by mouth 2 (two) times daily., Disp: 60 tablet, Rfl: 5 .  fluticasone (FLONASE) 50 MCG/ACT nasal spray, Place 2 sprays into both nostrils 2 (two) times a day. , Disp: , Rfl:  .  hyoscyamine (ANASPAZ) 0.125 MG TBDP disintergrating tablet, Place 1 tablet (0.125 mg total) under the tongue every 4 (four) hours as needed.,  Disp: 180 tablet, Rfl: 1 .  hyoscyamine (LEVSIN SL) 0.125 MG SL tablet, PLACE 1 TABLET UNDER THE TONGUE EVERY 4 HOURS AS NEEDED., Disp: 180 tablet, Rfl: 1 .  Multiple Vitamin (MULTIVITAMIN WITH MINERALS) TABS tablet, Take 1 tablet by mouth daily., Disp: , Rfl:  .  omeprazole (PRILOSEC) 20 MG capsule, Take 20 mg by mouth daily., Disp: , Rfl:  .  rosuvastatin (CRESTOR) 10 MG tablet, TAKE 1 TABLET (10 MG TOTAL) BY MOUTH DAILY., Disp: 90 tablet, Rfl: 3 .  sildenafil (VIAGRA) 50 MG tablet, , Disp: , Rfl:  .  triamcinolone cream (KENALOG) 0.5 %, Apply 1 application topically 2 (two) times daily. Apply to legs, Disp: 30 g, Rfl: 0 .  amLODipine (NORVASC) 5 MG tablet, TAKE 1 TABLET (5 MG TOTAL) BY MOUTH DAILY., Disp: 90 tablet, Rfl: 3 .  DULoxetine HCl 40 MG CPEP, Take 40 mg by mouth daily., Disp: 30 capsule, Rfl: 0 .  Hyoscyamine Sulfate 0.375 MG TBCR, , Disp: , Rfl:   Review of Systems  Constitutional: Negative for appetite change, chills and fever.  HENT: Negative.   Eyes: Negative.   Respiratory: Negative for chest tightness, shortness of breath and wheezing.   Cardiovascular: Positive for leg swelling. Negative for chest pain and palpitations.  Gastrointestinal: Negative for abdominal pain, nausea and vomiting.  Endocrine: Negative.   Skin: Positive for color change and rash.       Itchy skin on legs  Allergic/Immunologic: Negative.  Psychiatric/Behavioral: The patient is nervous/anxious.     Social History   Tobacco Use  . Smoking status: Never Smoker  . Smokeless tobacco: Never Used  Substance Use Topics  . Alcohol use: Yes    Comment: rarely      Objective:   BP (!) 148/90 (BP Location: Left Arm, Cuff Size: Normal)   Pulse 62   Temp (!) 97.1 F (36.2 C) (Temporal)   Resp 16   Wt 150 lb (68 kg)   SpO2 98% Comment: room air  BMI 23.49 kg/m  Vitals:   08/19/19 1526 08/19/19 1530  BP: (!) 150/90 (!) 148/90  Pulse: 62   Resp: 16   Temp: (!) 97.1 F (36.2 C)   TempSrc:  Temporal   SpO2: 98%   Weight: 150 lb (68 kg)   Body mass index is 23.49 kg/m.   Physical Exam Vitals reviewed.  HENT:     Head: Normocephalic and atraumatic.     Right Ear: External ear normal.     Left Ear: External ear normal.  Eyes:     General: No scleral icterus.    Conjunctiva/sclera: Conjunctivae normal.  Cardiovascular:     Rate and Rhythm: Normal rate and regular rhythm.     Heart sounds: Normal heart sounds.  Pulmonary:     Breath sounds: Normal breath sounds.  Musculoskeletal:     Right lower leg: Edema present.     Left lower leg: No edema.     Comments: There is only 1+ right foot edema.  No further edema above the ankle on either leg.  Skin:    General: Skin is warm and dry.     Comments: Erythematous and eczematous appearing rash in the anterior legs of both lower extremities.  The rash is at least 50% improved from last visit.  Neurological:     General: No focal deficit present.     Mental Status: He is alert and oriented to person, place, and time.  Psychiatric:        Mood and Affect: Mood normal.        Behavior: Behavior normal.        Thought Content: Thought content normal.        Judgment: Judgment normal.      No results found for any visits on 08/19/19.     Assessment & Plan    1. Dermatitis But improved but will refer to dermatology.  Excoriations are present.  Think this has to do with his OCD. - Ambulatory referral to Dermatology  2. Essential hypertension Follow and recheck in 2 months I have encouraged him to work on his exercise.  3. Non-seasonal allergic rhinitis, unspecified trigger   4. Obsessive-compulsive disorder, unspecified type Major issue for this patient as he focuses on his health issues. 5. Leg swelling Much improved off of amlodipine.  6. Pelvic floor dysfunction Per GI.    I,Roshena L Chambers,acting as a scribe for Wilhemena Durie, MD.,have documented all relevant documentation on the behalf of Wilhemena Durie, MD,as directed by  Wilhemena Durie, MD while in the presence of Wilhemena Durie, MD.   Wilhemena Durie, MD  [facility]

## 2019-08-19 ENCOUNTER — Encounter: Payer: Self-pay | Admitting: Family Medicine

## 2019-08-19 ENCOUNTER — Ambulatory Visit (INDEPENDENT_AMBULATORY_CARE_PROVIDER_SITE_OTHER): Payer: BC Managed Care – PPO | Admitting: Family Medicine

## 2019-08-19 ENCOUNTER — Other Ambulatory Visit: Payer: Self-pay

## 2019-08-19 VITALS — BP 120/80 | HR 62 | Temp 97.1°F | Resp 16 | Wt 150.0 lb

## 2019-08-19 DIAGNOSIS — M6289 Other specified disorders of muscle: Secondary | ICD-10-CM

## 2019-08-19 DIAGNOSIS — F429 Obsessive-compulsive disorder, unspecified: Secondary | ICD-10-CM

## 2019-08-19 DIAGNOSIS — I10 Essential (primary) hypertension: Secondary | ICD-10-CM | POA: Diagnosis not present

## 2019-08-19 DIAGNOSIS — L309 Dermatitis, unspecified: Secondary | ICD-10-CM | POA: Diagnosis not present

## 2019-08-19 DIAGNOSIS — M7989 Other specified soft tissue disorders: Secondary | ICD-10-CM

## 2019-08-19 DIAGNOSIS — J3089 Other allergic rhinitis: Secondary | ICD-10-CM

## 2019-08-26 ENCOUNTER — Ambulatory Visit: Payer: BC Managed Care – PPO | Attending: Internal Medicine

## 2019-08-26 DIAGNOSIS — Z20822 Contact with and (suspected) exposure to covid-19: Secondary | ICD-10-CM | POA: Diagnosis not present

## 2019-08-28 LAB — NOVEL CORONAVIRUS, NAA: SARS-CoV-2, NAA: NOT DETECTED

## 2019-09-06 DIAGNOSIS — F331 Major depressive disorder, recurrent, moderate: Secondary | ICD-10-CM | POA: Diagnosis not present

## 2019-09-13 DIAGNOSIS — K529 Noninfective gastroenteritis and colitis, unspecified: Secondary | ICD-10-CM | POA: Diagnosis not present

## 2019-09-13 DIAGNOSIS — R159 Full incontinence of feces: Secondary | ICD-10-CM | POA: Diagnosis not present

## 2019-09-13 DIAGNOSIS — M6289 Other specified disorders of muscle: Secondary | ICD-10-CM | POA: Diagnosis not present

## 2019-09-22 DIAGNOSIS — Z6823 Body mass index (BMI) 23.0-23.9, adult: Secondary | ICD-10-CM | POA: Diagnosis not present

## 2019-09-22 DIAGNOSIS — M6289 Other specified disorders of muscle: Secondary | ICD-10-CM | POA: Diagnosis not present

## 2019-09-22 DIAGNOSIS — R197 Diarrhea, unspecified: Secondary | ICD-10-CM | POA: Diagnosis not present

## 2019-09-28 DIAGNOSIS — J310 Chronic rhinitis: Secondary | ICD-10-CM | POA: Diagnosis not present

## 2019-10-02 ENCOUNTER — Encounter: Payer: Self-pay | Admitting: Family Medicine

## 2019-10-04 DIAGNOSIS — L309 Dermatitis, unspecified: Secondary | ICD-10-CM | POA: Diagnosis not present

## 2019-10-04 DIAGNOSIS — L209 Atopic dermatitis, unspecified: Secondary | ICD-10-CM | POA: Diagnosis not present

## 2019-10-04 DIAGNOSIS — D229 Melanocytic nevi, unspecified: Secondary | ICD-10-CM | POA: Diagnosis not present

## 2019-10-08 DIAGNOSIS — M6289 Other specified disorders of muscle: Secondary | ICD-10-CM | POA: Diagnosis not present

## 2019-10-08 DIAGNOSIS — R159 Full incontinence of feces: Secondary | ICD-10-CM | POA: Diagnosis not present

## 2019-10-08 DIAGNOSIS — K529 Noninfective gastroenteritis and colitis, unspecified: Secondary | ICD-10-CM | POA: Diagnosis not present

## 2019-10-14 NOTE — Progress Notes (Signed)
Established patient visit   Patient: William Coleman.   DOB: [birth year]   59 y.o. Male  MRN: [id number] Visit Date: 10/19/2019  Today's healthcare provider: Wilhemena Durie, MD   Chief Complaint  Patient presents with  . Follow-up  . Hypertension   Subjective    HPI  Patient followed by GI and ENT for his chronic complaints of sinus issues and pelvic floor dysfunction/diarrhea. Hypertension, follow-up  BP Readings from Last 3 Encounters:  10/19/19 (!) 141/87  08/19/19 120/80  08/05/19 127/78   Wt Readings from Last 3 Encounters:  10/19/19 151 lb (68.5 kg)  08/19/19 150 lb (68 kg)  08/05/19 154 lb 12.8 oz (70.2 kg)     He was last seen for hypertension 2 months ago.  BP at that visit was 120/80. Management since that visit includes; Follow and recheck in 2 months. I have encouraged him to work on his exercise. He reports good compliance with treatment. He is not having side effects. none He is not exercising. He is adherent to low salt diet.   Outside blood pressures are not checking currently.  He does not smoke.  Use of agents associated with hypertension: none.   --------------------------------------------------------------------  Patient states the swelling and rash in his legs has improved since stopping amlodipine.   Obsessive-compulsive disorder, unspecified type From 08/19/2019-Major issue for this patient as he focuses on his health issues. Followed by psychiatry. Leg swelling From 08/19/2019-Much improved off of amlodipine.      Medications: Outpatient Medications Prior to Visit  Medication Sig  . Ascorbic Acid (VITAMIN C) 100 MG tablet Take 100 mg by mouth daily.  . diphenoxylate-atropine (LOMOTIL) 2.5-0.025 MG tablet Take 2 tablets by mouth 4 (four) times daily as needed for diarrhea or loose stools.  . DULoxetine (CYMBALTA) 30 MG capsule Take 30 mg by mouth daily.  . Eluxadoline (VIBERZI) 100 MG TABS Take 1 tablet (100 mg total)  by mouth 2 (two) times daily.  . fluticasone (FLONASE) 50 MCG/ACT nasal spray Place 2 sprays into both nostrils 2 (two) times a day.   . hyoscyamine (ANASPAZ) 0.125 MG TBDP disintergrating tablet Place 1 tablet (0.125 mg total) under the tongue every 4 (four) hours as needed.  . hyoscyamine (LEVSIN SL) 0.125 MG SL tablet PLACE 1 TABLET UNDER THE TONGUE EVERY 4 HOURS AS NEEDED.  . Multiple Vitamin (MULTIVITAMIN WITH MINERALS) TABS tablet Take 1 tablet by mouth daily.  Marland Kitchen omeprazole (PRILOSEC) 20 MG capsule Take 20 mg by mouth daily.  . rosuvastatin (CRESTOR) 10 MG tablet TAKE 1 TABLET (10 MG TOTAL) BY MOUTH DAILY.  . sildenafil (VIAGRA) 50 MG tablet   . triamcinolone cream (KENALOG) 0.5 % Apply 1 application topically 2 (two) times daily. Apply to legs  . amLODipine (NORVASC) 5 MG tablet TAKE 1 TABLET (5 MG TOTAL) BY MOUTH DAILY.  . DULoxetine HCl 40 MG CPEP Take 40 mg by mouth daily.  Marland Kitchen Hyoscyamine Sulfate 0.375 MG TBCR    No facility-administered medications prior to visit.    Review of Systems  Constitutional: Negative for appetite change, chills and fever.  HENT: Positive for postnasal drip.   Eyes: Negative.   Respiratory: Negative for chest tightness, shortness of breath and wheezing.   Cardiovascular: Negative for chest pain and palpitations.  Gastrointestinal: Positive for diarrhea. Negative for abdominal pain, nausea and vomiting.  Endocrine: Negative.   Allergic/Immunologic: Negative.   Hematological: Negative.   Psychiatric/Behavioral: The patient is nervous/anxious.  Objective    BP (!) 141/87 (BP Location: Left Arm, Patient Position: Sitting, Cuff Size: Large)   Pulse 69   Temp (!) 97.3 F (36.3 C) (Other (Comment))   Resp 16   Ht 5\' 7"  (1.702 m)   Wt 151 lb (68.5 kg)   SpO2 96%   BMI 23.65 kg/m  BP Readings from Last 3 Encounters:  10/19/19 (!) 141/87  08/19/19 120/80  08/05/19 127/78   Wt Readings from Last 3 Encounters:  10/19/19 151 lb (68.5 kg)    08/19/19 150 lb (68 kg)  08/05/19 154 lb 12.8 oz (70.2 kg)      Physical Exam Vitals and nursing note reviewed.  HENT:     Head: Normocephalic and atraumatic.     Right Ear: External ear normal.     Left Ear: External ear normal.  Eyes:     General: No scleral icterus.    Conjunctiva/sclera: Conjunctivae normal.  Cardiovascular:     Rate and Rhythm: Normal rate and regular rhythm.     Heart sounds: Normal heart sounds.  Pulmonary:     Breath sounds: Normal breath sounds.  Musculoskeletal:     Comments: .  No further edema above the ankle on either leg.  Skin:    General: Skin is warm and dry.     Comments: Erythematous and eczematous appearing rash in the anterior legs of both lower extremities.  The rash is at least 80 to 90% better.  Neurological:     General: No focal deficit present.     Mental Status: He is alert and oriented to person, place, and time.  Psychiatric:        Mood and Affect: Mood normal.        Behavior: Behavior normal.        Thought Content: Thought content normal.        Judgment: Judgment normal.       No results found for any visits on 10/19/19.  Assessment & Plan     1. Essential hypertension Check blood pressure at home and bring these back in 3 months. Regular walking will help and  this is conveyed to the patient  2. Non-seasonal allergic rhinitis, unspecified trigger Per ENT.  3. Laryngopharyngeal reflux (LPR) Per ENT.  4. Severe recurrent major depression without psychotic features ([place]) Followed by psychiatry.  In partial remission.  5. Obsessive-compulsive disorder, unspecified type He is especially OC about health issues and his GI issues.   No follow-ups on file.      I, Wilhemena Durie, MD, have reviewed all documentation for this visit. The documentation on 10/24/19 for the exam, diagnosis, procedures, and orders are all accurate and complete.    Megham Dwyer Cranford Mon, MD  [facility] [phone] (phone) [phone] (fax)  [place]

## 2019-10-19 ENCOUNTER — Encounter: Payer: Self-pay | Admitting: Family Medicine

## 2019-10-19 ENCOUNTER — Ambulatory Visit: Payer: BC Managed Care – PPO | Admitting: Family Medicine

## 2019-10-19 ENCOUNTER — Other Ambulatory Visit: Payer: Self-pay

## 2019-10-19 VITALS — BP 141/87 | HR 69 | Temp 97.3°F | Resp 16 | Ht 67.0 in | Wt 151.0 lb

## 2019-10-19 DIAGNOSIS — I10 Essential (primary) hypertension: Secondary | ICD-10-CM | POA: Diagnosis not present

## 2019-10-19 DIAGNOSIS — K219 Gastro-esophageal reflux disease without esophagitis: Secondary | ICD-10-CM

## 2019-10-19 DIAGNOSIS — F332 Major depressive disorder, recurrent severe without psychotic features: Secondary | ICD-10-CM

## 2019-10-19 DIAGNOSIS — F429 Obsessive-compulsive disorder, unspecified: Secondary | ICD-10-CM

## 2019-10-19 DIAGNOSIS — J3089 Other allergic rhinitis: Secondary | ICD-10-CM

## 2019-10-25 DIAGNOSIS — K529 Noninfective gastroenteritis and colitis, unspecified: Secondary | ICD-10-CM | POA: Diagnosis not present

## 2019-10-25 DIAGNOSIS — M6289 Other specified disorders of muscle: Secondary | ICD-10-CM | POA: Diagnosis not present

## 2019-10-25 DIAGNOSIS — R159 Full incontinence of feces: Secondary | ICD-10-CM | POA: Diagnosis not present

## 2019-10-29 ENCOUNTER — Ambulatory Visit: Payer: BC Managed Care – PPO | Attending: Internal Medicine

## 2019-10-29 DIAGNOSIS — Z20822 Contact with and (suspected) exposure to covid-19: Secondary | ICD-10-CM | POA: Diagnosis not present

## 2019-10-30 LAB — SARS-COV-2, NAA 2 DAY TAT

## 2019-10-30 LAB — NOVEL CORONAVIRUS, NAA: SARS-CoV-2, NAA: NOT DETECTED

## 2019-11-01 DIAGNOSIS — F331 Major depressive disorder, recurrent, moderate: Secondary | ICD-10-CM | POA: Diagnosis not present

## 2019-11-04 DIAGNOSIS — L814 Other melanin hyperpigmentation: Secondary | ICD-10-CM | POA: Diagnosis not present

## 2019-11-04 DIAGNOSIS — L821 Other seborrheic keratosis: Secondary | ICD-10-CM | POA: Diagnosis not present

## 2019-11-04 DIAGNOSIS — I872 Venous insufficiency (chronic) (peripheral): Secondary | ICD-10-CM | POA: Diagnosis not present

## 2019-11-04 DIAGNOSIS — D492 Neoplasm of unspecified behavior of bone, soft tissue, and skin: Secondary | ICD-10-CM | POA: Diagnosis not present

## 2019-11-04 DIAGNOSIS — D2361 Other benign neoplasm of skin of right upper limb, including shoulder: Secondary | ICD-10-CM | POA: Diagnosis not present

## 2019-12-06 NOTE — Progress Notes (Signed)
Trena Platt Cummings,acting as a scribe for Wilhemena Durie, MD.,have documented all relevant documentation on the behalf of Wilhemena Durie, MD,as directed by  Wilhemena Durie, MD while in the presence of Wilhemena Durie, MD.  Established patient visit   Patient: William Coleman.   DOB: 10/20/1960   59 y.o. Male  MRN: [id number] Visit Date: 12/07/2019  Today's healthcare provider: Wilhemena Durie, MD   Chief Complaint  Patient presents with  . Hypertension   Subjective    HPI  Everything is stable for the patient at about the same.  Concerns are the same and listed below. Hypertension, follow-up  BP Readings from Last 3 Encounters:  12/07/19 (!) 162/102  10/19/19 (!) 141/87  08/19/19 120/80   Wt Readings from Last 3 Encounters:  12/07/19 157 lb 6.4 oz (71.4 kg)  10/19/19 151 lb (68.5 kg)  08/19/19 150 lb (68 kg)     He was last seen for hypertension 2 months ago.  BP at that visit was 141/87. Management since that visit includes; Check blood pressure at home and bring these back in 3 months. Regular walking will help and  this is conveyed to the patient. He reports excellent compliance with treatment. He is not having side effects.  He is exercising. He is adherent to low salt diet.   Outside blood pressures are being checked.  He does not smoke.  Use of agents associated with hypertension: none.   ---------------------------------------------------------------------------------------------------  Severe recurrent major depression without psychotic features ([place]) From 10/19/2019-Followed by psychiatry.  In partial remission.  Obsessive-compulsive disorder, unspecified type From 10/19/2019-He is especially OC about health issues and his GI issues.   Medications: Outpatient Medications Prior to Visit  Medication Sig  . Ascorbic Acid (VITAMIN C) 100 MG tablet Take 100 mg by mouth daily.  . cariprazine (VRAYLAR) capsule   .  diphenoxylate-atropine (LOMOTIL) 2.5-0.025 MG tablet Take 2 tablets by mouth 4 (four) times daily as needed for diarrhea or loose stools.  . Eluxadoline (VIBERZI) 100 MG TABS Take 1 tablet (100 mg total) by mouth 2 (two) times daily.  . hyoscyamine (LEVSIN SL) 0.125 MG SL tablet PLACE 1 TABLET UNDER THE TONGUE EVERY 4 HOURS AS NEEDED.  . Multiple Vitamin (MULTIVITAMIN WITH MINERALS) TABS tablet Take 1 tablet by mouth daily.  Marland Kitchen omeprazole (PRILOSEC) 20 MG capsule Take 20 mg by mouth daily.  . rosuvastatin (CRESTOR) 10 MG tablet TAKE 1 TABLET (10 MG TOTAL) BY MOUTH DAILY.  . sildenafil (VIAGRA) 50 MG tablet   . triamcinolone cream (KENALOG) 0.5 % Apply 1 application topically 2 (two) times daily. Apply to legs  . amLODipine (NORVASC) 5 MG tablet TAKE 1 TABLET (5 MG TOTAL) BY MOUTH DAILY.  . DULoxetine (CYMBALTA) 30 MG capsule Take 30 mg by mouth daily. (Patient not taking: Reported on 12/07/2019)  . DULoxetine HCl 40 MG CPEP Take 40 mg by mouth daily.  . hyoscyamine (ANASPAZ) 0.125 MG TBDP disintergrating tablet Place 1 tablet (0.125 mg total) under the tongue every 4 (four) hours as needed. (Patient not taking: Reported on 12/07/2019)  . Hyoscyamine Sulfate 0.375 MG TBCR    No facility-administered medications prior to visit.    Review of Systems  Constitutional: Negative for appetite change, chills and fever.  Respiratory: Negative for chest tightness, shortness of breath and wheezing.   Cardiovascular: Negative for chest pain and palpitations.  Gastrointestinal: Negative for abdominal pain, nausea and vomiting.  Chronic fecal "leakage." per pt.       Objective    BP (!) 162/102 (BP Location: Left Arm, Cuff Size: Large)   Pulse 69   Temp (!) 97.5 F (36.4 C) (Temporal)   Ht 5\' 7"  (1.702 m)   Wt 157 lb 6.4 oz (71.4 kg)   BMI 24.65 kg/m     Physical Exam    No results found for any visits on 12/07/19.  Assessment & Plan     1. Essential hypertension Added metoprolol  succinate 25 mg daily. - metoprolol succinate (TOPROL-XL) 25 MG 24 hr tablet; Take 1 tablet (25 mg total) by mouth daily.  Dispense: 30 tablet; Refill: 12  2. Obsessive-compulsive disorder, unspecified type Followed by psychiatry Impacts daily functioning. 3. Severe recurrent major depression without psychotic features [facility]) Also followed by psychiatry.    Return in 2 months (on 02/07/2020).      I, Wilhemena Durie, MD, have reviewed all documentation for this visit. The documentation on 12/14/19 for the exam, diagnosis, procedures, and orders are all accurate and complete.    Abbagayle Zaragoza Cranford Mon, MD  [facility] [phone] (phone) [phone] (fax)  [place]

## 2019-12-07 ENCOUNTER — Encounter: Payer: Self-pay | Admitting: Family Medicine

## 2019-12-07 ENCOUNTER — Ambulatory Visit: Payer: BC Managed Care – PPO | Admitting: Family Medicine

## 2019-12-07 ENCOUNTER — Other Ambulatory Visit: Payer: Self-pay

## 2019-12-07 VITALS — BP 162/102 | HR 69 | Temp 97.5°F | Ht 67.0 in | Wt 157.4 lb

## 2019-12-07 DIAGNOSIS — F332 Major depressive disorder, recurrent severe without psychotic features: Secondary | ICD-10-CM

## 2019-12-07 DIAGNOSIS — F429 Obsessive-compulsive disorder, unspecified: Secondary | ICD-10-CM

## 2019-12-07 DIAGNOSIS — I10 Essential (primary) hypertension: Secondary | ICD-10-CM

## 2019-12-07 MED ORDER — METOPROLOL SUCCINATE ER 25 MG PO TB24: 25.0000 mg | ORAL_TABLET | Freq: Every day | ORAL | 12 refills | Status: DC

## 2019-12-14 ENCOUNTER — Telehealth: Payer: Self-pay

## 2019-12-14 DIAGNOSIS — I10 Essential (primary) hypertension: Secondary | ICD-10-CM

## 2019-12-14 NOTE — Telephone Encounter (Signed)
Copied from [place] [id number]. Topic: General - Other >> Dec 14, 2019  8:57 AM Leim Fabry wrote: Reason for CRM: patient wants to let they nurse know that some medications are causing some uncomfortable side effects. metoprolol succinate (TOPROL-XL) 25 MG is causing loose stool.

## 2019-12-16 NOTE — Telephone Encounter (Signed)
Pt called stating that this medication is causing him to have diarrhea. Pt is requesting to have something else sent in for him. Please advise .    [facility] [address] [place], [place] - [place] [address]  Phone: [phone] Fax: [phone]  Hours: Not open 24 hours

## 2019-12-17 ENCOUNTER — Telehealth: Payer: Self-pay

## 2019-12-17 MED ORDER — VERAPAMIL HCL ER 120 MG PO TBCR: 120.0000 mg | EXTENDED_RELEASE_TABLET | Freq: Every day | ORAL | 11 refills | Status: DC

## 2019-12-17 NOTE — Telephone Encounter (Signed)
Copied from [place] [id number]. Topic: General - Other >> Dec 17, 2019  3:27 PM Rainey Pines A wrote: Patient would like a callback from nurse on how to take verapamil medication. Patient is wanting to clarify at what time of day he needs to take the medication. Please advise

## 2019-12-17 NOTE — Telephone Encounter (Signed)
Left voicemail advising patient to discontinue Metoprolol. New medication Verapamil has been sent in to pharmacy for patient.

## 2019-12-17 NOTE — Telephone Encounter (Signed)
Stop metoprolol and try verapamil SR 120 mg daily, #30, 11 refills

## 2019-12-17 NOTE — Addendum Note (Signed)
Addended by: Gerald Stabs on: 12/17/2019 10:00 AM   Modules accepted: Orders

## 2019-12-18 NOTE — Telephone Encounter (Signed)
morning

## 2019-12-20 NOTE — Telephone Encounter (Signed)
Patient just wanted to let us know that he is taking verapamil at bedtime. Patient reports he has only taken medication for 3 nights. Patient reports feeling tired since starting medication. I advised patient to check BP at home, stay well hydrated. Patient reports that he will continue to take medication and check BP, will give a few more days to get adjusted to medication. I advised to call back with worsening symptoms or any concerns.

## 2019-12-20 NOTE — Telephone Encounter (Signed)
Relayed message to patient.  Patient is still requesting a call back from nurse to discuss

## 2019-12-24 DIAGNOSIS — K529 Noninfective gastroenteritis and colitis, unspecified: Secondary | ICD-10-CM | POA: Diagnosis not present

## 2019-12-24 DIAGNOSIS — R159 Full incontinence of feces: Secondary | ICD-10-CM | POA: Diagnosis not present

## 2019-12-24 DIAGNOSIS — M6289 Other specified disorders of muscle: Secondary | ICD-10-CM | POA: Diagnosis not present

## 2020-01-04 DIAGNOSIS — F331 Major depressive disorder, recurrent, moderate: Secondary | ICD-10-CM | POA: Diagnosis not present

## 2020-01-17 ENCOUNTER — Telehealth: Payer: Self-pay

## 2020-01-17 DIAGNOSIS — I872 Venous insufficiency (chronic) (peripheral): Secondary | ICD-10-CM | POA: Diagnosis not present

## 2020-01-17 DIAGNOSIS — D229 Melanocytic nevi, unspecified: Secondary | ICD-10-CM | POA: Diagnosis not present

## 2020-01-17 DIAGNOSIS — L821 Other seborrheic keratosis: Secondary | ICD-10-CM | POA: Diagnosis not present

## 2020-01-17 DIAGNOSIS — E782 Mixed hyperlipidemia: Secondary | ICD-10-CM

## 2020-01-17 NOTE — Telephone Encounter (Signed)
Copied from [place] [id number]. Topic: General - Other >> Jan 17, 2020 11:43 AM Leward Quan A wrote: Reason for CRM: Patient called to request samples of rosuvastatin (CRESTOR) 10 MG tablet say that he accidentally spilled some of his pills down the drain in his kitchen and will not be able to get an early refill. He is asking for a call back at Ph# [phone]

## 2020-01-18 MED ORDER — ROSUVASTATIN CALCIUM 10 MG PO TABS: 10.0000 mg | ORAL_TABLET | Freq: Every day | ORAL | 3 refills | Status: DC

## 2020-01-18 NOTE — Telephone Encounter (Signed)
We do not have samples.  It is okay to wait until the next time it is refilled.  We can send in an early refill if patient wants

## 2020-01-18 NOTE — Telephone Encounter (Signed)
Refill sent to patients pharmacy. 

## 2020-01-20 ENCOUNTER — Telehealth: Payer: Self-pay | Admitting: Family Medicine

## 2020-01-20 NOTE — Telephone Encounter (Signed)
Patient came to front desk to ask if we have any samples of Crestor/rosuvastatin. He was told we do not have samples. He was asked to give [place] a call to let us know when he running short on his most recent refill.   Thanks, [employer]

## 2020-01-20 NOTE — Telephone Encounter (Signed)
Patient advised that refill was sent in for him to his pharmacy yesterday.

## 2020-02-04 NOTE — Progress Notes (Signed)
Established patient visit   Patient: William Coleman.   DOB: October 01, 1960   59 y.o. Male  MRN: [id number] Visit Date: 02/07/2020  Today's healthcare provider: Wilhemena Durie, MD   Chief Complaint  Patient presents with  . Hypertension   Subjective    HPI  Patient is tolerating verapamil.  His complaints are the same as in the past.  Chronic problems with his bowels.  They request Cialis for his ED. he has no cardiovascular symptoms. Hypertension, follow-up  BP Readings from Last 3 Encounters:  02/07/20 (!) 146/93  12/07/19 (!) 162/102  10/19/19 (!) 141/87   Wt Readings from Last 3 Encounters:  02/07/20 170 lb 12.8 oz (77.5 kg)  12/07/19 157 lb 6.4 oz (71.4 kg)  10/19/19 151 lb (68.5 kg)     He was last seen for hypertension 2 months ago.  BP at that visit was 162/102. Management since that visit includes; Added metoprolol succinate 25 mg daily, patient was not able to tolerate so he is currently on Verapamil 120mg . He reports good compliance with treatment. He is not having side effects.   He is not exercising. He is adherent to low salt diet.   Outside blood pressures are being monitored and patient reports fluctuating blood pressure readings.  He does not smoke.  Use of agents associated with hypertension: none.   ---------------------------------------------------------------------------------------------------  Obsessive-compulsive disorder, unspecified type From 12/07/2019-Followed by psychiatry. Impacts daily functioning.  Severe recurrent major depression without psychotic features ([place]) From 12/07/2019-Also followed by psychiatry.   Patient Active Problem List   Diagnosis Date Noted  . MDD (major depressive disorder), recurrent severe, without psychosis ([place]) 11/18/2018  . Laryngopharyngeal reflux (LPR) 01/01/2018  . Deviated nasal septum 01/01/2018  . Non-seasonal allergic rhinitis 01/01/2018  . Incontinence of feces 11/10/2017  . OCD  (obsessive compulsive disorder) 01/01/2017  . Severe recurrent major depression without psychotic features ([place]) 11/10/2015  . Depression 11/10/2014  . HLD (hyperlipidemia) 11/10/2014  . HTN (hypertension) 11/10/2014  . Adult hypothyroidism 11/10/2014  . 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/10/2014   Past Medical History:  Diagnosis Date  . Anancastic neurosis   . Anginal pain ([place])   . Anxiety, generalized   . Bipolar disorder ([place])   . Chronic diarrhea   . Depression   . Hypertension   . Hypothyroidism   . Obsessive compulsive disorder   . OCD (obsessive compulsive disorder)   . Sleep apnea    patient states it was "marginal", no CPAP   Social History   Tobacco Use  . Smoking status: Never Smoker  . Smokeless tobacco: Never Used  Vaping Use  . Vaping Use: Never used  Substance Use Topics  . Alcohol use: Yes    Comment: rarely  . Drug use: No   Allergies  Allergen Reactions  . Penicillins Hives and Rash  . Sulfa Antibiotics Hives  . Amoxicillin Hives       Medications: Outpatient Medications Prior to Visit  Medication Sig  . Ascorbic Acid (VITAMIN C) 100 MG tablet Take 100 mg by mouth daily.  . cariprazine (VRAYLAR) capsule   . diphenoxylate-atropine (LOMOTIL) 2.5-0.025 MG tablet Take 2 tablets by mouth 4 (four) times daily as needed for diarrhea or loose stools.  . DULoxetine (CYMBALTA) 30 MG capsule Take 30 mg by mouth daily. (Patient not taking: Reported on 12/07/2019)  . Eluxadoline (VIBERZI) 100 MG TABS Take 1 tablet (100 mg total) by mouth 2 (two) times daily.  . hyoscyamine (ANASPAZ) 0.125 MG  TBDP disintergrating tablet Place 1 tablet (0.125 mg total) under the tongue every 4 (four) hours as needed. (Patient not taking: Reported on 12/07/2019)  . hyoscyamine (LEVSIN SL) 0.125 MG SL tablet PLACE 1 TABLET UNDER THE TONGUE EVERY 4 HOURS AS NEEDED.  . Multiple Vitamin (MULTIVITAMIN WITH MINERALS) TABS tablet Take 1 tablet by mouth daily.  Marland Kitchen omeprazole (PRILOSEC) 20 MG capsule  Take 20 mg by mouth daily.  . rosuvastatin (CRESTOR) 10 MG tablet Take 1 tablet (10 mg total) by mouth daily.  . sildenafil (VIAGRA) 50 MG tablet   . triamcinolone cream (KENALOG) 0.5 % Apply 1 application topically 2 (two) times daily. Apply to legs  . verapamil (CALAN-SR) 120 MG CR tablet Take 1 tablet (120 mg total) by mouth at bedtime.  . [DISCONTINUED] amLODipine (NORVASC) 5 MG tablet TAKE 1 TABLET (5 MG TOTAL) BY MOUTH DAILY.  . [DISCONTINUED] DULoxetine HCl 40 MG CPEP Take 40 mg by mouth daily.  . [DISCONTINUED] Hyoscyamine Sulfate 0.375 MG TBCR    No facility-administered medications prior to visit.    Review of Systems  Constitutional: Negative for appetite change, chills and fever.  Respiratory: Negative for chest tightness, shortness of breath and wheezing.   Cardiovascular: Negative for chest pain and palpitations.  Gastrointestinal: Negative for abdominal pain, nausea and vomiting.       Objective    BP (!) 146/93   Pulse 85   Temp 98.6 F (37 C) (Oral)   Resp 16   Wt 170 lb 12.8 oz (77.5 kg)   BMI 26.75 kg/m  BP Readings from Last 3 Encounters:  02/07/20 (!) 146/93  12/07/19 (!) 162/102  10/19/19 (!) 141/87   Wt Readings from Last 3 Encounters:  02/07/20 170 lb 12.8 oz (77.5 kg)  12/07/19 157 lb 6.4 oz (71.4 kg)  10/19/19 151 lb (68.5 kg)      Physical Exam Vitals and nursing note reviewed.  HENT:     Head: Normocephalic and atraumatic.     Right Ear: External ear normal.     Left Ear: External ear normal.  Eyes:     General: No scleral icterus.    Conjunctiva/sclera: Conjunctivae normal.  Cardiovascular:     Rate and Rhythm: Normal rate and regular rhythm.     Heart sounds: Normal heart sounds.  Pulmonary:     Breath sounds: Normal breath sounds.  Musculoskeletal:     Right lower leg: No edema.     Left lower leg: No edema.     Comments: .  No further edema above the ankle on either leg.  Skin:    General: Skin is warm and dry.      Comments: Erythematous and eczematous appearing rash in the anterior legs of both lower extremities.  The rash is at least 80 to 90% better.  Neurological:     General: No focal deficit present.     Mental Status: He is alert and oriented to person, place, and time.  Psychiatric:        Mood and Affect: Mood normal.        Behavior: Behavior normal.        Thought Content: Thought content normal.       No results found for any visits on 02/07/20.  Assessment & Plan     1. Mixed hyperlipidemia   2. Essential hypertension Double verapamil dose.  Follow-up 2 months. - verapamil (CALAN-SR) 240 MG CR tablet; Take one tablet daily  Dispense: 90 tablet; Refill: 1  3. Severe recurrent major depression without psychotic features ([place]) Followed by psychiatry  4. Obsessive-compulsive disorder, unspecified type Per psychiatry.  5. Erectile dysfunction, unspecified erectile dysfunction type  - tadalafil (CIALIS) 20 MG tablet; Take one tablet every 3 days as needed for ED.  Dispense: 10 tablet; Refill: 5   No follow-ups on file.         Taylour Lietzke Cranford Mon, MD  [facility] [phone] (phone) [phone] (fax)  [place]

## 2020-02-07 ENCOUNTER — Ambulatory Visit (INDEPENDENT_AMBULATORY_CARE_PROVIDER_SITE_OTHER): Payer: BC Managed Care – PPO | Admitting: Family Medicine

## 2020-02-07 ENCOUNTER — Other Ambulatory Visit: Payer: Self-pay

## 2020-02-07 ENCOUNTER — Encounter: Payer: Self-pay | Admitting: Family Medicine

## 2020-02-07 VITALS — BP 146/93 | HR 85 | Temp 98.6°F | Resp 16 | Wt 170.8 lb

## 2020-02-07 DIAGNOSIS — E782 Mixed hyperlipidemia: Secondary | ICD-10-CM | POA: Diagnosis not present

## 2020-02-07 DIAGNOSIS — F429 Obsessive-compulsive disorder, unspecified: Secondary | ICD-10-CM | POA: Diagnosis not present

## 2020-02-07 DIAGNOSIS — F332 Major depressive disorder, recurrent severe without psychotic features: Secondary | ICD-10-CM

## 2020-02-07 DIAGNOSIS — N529 Male erectile dysfunction, unspecified: Secondary | ICD-10-CM

## 2020-02-07 DIAGNOSIS — I10 Essential (primary) hypertension: Secondary | ICD-10-CM

## 2020-02-07 MED ORDER — VERAPAMIL HCL ER 240 MG PO TBCR: EXTENDED_RELEASE_TABLET | ORAL | 1 refills | Status: DC

## 2020-02-07 MED ORDER — TADALAFIL 20 MG PO TABS: ORAL_TABLET | ORAL | 5 refills | Status: DC

## 2020-02-10 ENCOUNTER — Other Ambulatory Visit: Payer: Self-pay | Admitting: Family Medicine

## 2020-02-10 DIAGNOSIS — E782 Mixed hyperlipidemia: Secondary | ICD-10-CM

## 2020-02-21 ENCOUNTER — Ambulatory Visit: Payer: Self-pay | Admitting: Family Medicine

## 2020-03-10 NOTE — Progress Notes (Signed)
Established patient visit   Patient: William Coleman.   DOB: July 31, 1960   59 y.o. Male  MRN: [id number] Visit Date: 03/13/2020  Today's healthcare provider: Wilhemena Durie, MD   No chief complaint on file.  Subjective    HPI  Patient recently stopped the Vraylar on his own and states that he is feeling better off of it.  He states that his blood pressure is better and he feels less agitated regarding his health.  He sees psychiatry later this month. Hypertension, follow-up  BP Readings from Last 3 Encounters:  03/13/20 120/74  02/07/20 (!) 146/93  12/07/19 (!) 162/102   Wt Readings from Last 3 Encounters:  03/13/20 172 lb (78 kg)  02/07/20 170 lb 12.8 oz (77.5 kg)  12/07/19 157 lb 6.4 oz (71.4 kg)     He was last seen for hypertension 1 months ago.  BP at that visit was 146/93. Management since that visit includes; Double verapamil dose.  Follow-up 2 months. He reports excellent compliance with treatment. He is not having side effects.  He is exercising. He is not adherent to low salt diet.   Outside blood pressures are not being checked.  He does not smoke.  Use of agents associated with hypertension: none.   ---------------------------------------------------------------------------------------------------  Patient Active Problem List   Diagnosis Date Noted  . MDD (major depressive disorder), recurrent severe, without psychosis ([employer]) 11/18/2018  . Laryngopharyngeal reflux (LPR) 01/01/2018  . Deviated nasal septum 01/01/2018  . Non-seasonal allergic rhinitis 01/01/2018  . Incontinence of feces 11/10/2017  . OCD (obsessive compulsive disorder) 01/01/2017  . Severe recurrent major depression without psychotic features ([place]) 11/10/2015  . Depression 11/10/2014  . HLD (hyperlipidemia) 11/10/2014  . HTN (hypertension) 11/10/2014  . Adult hypothyroidism 11/10/2014  . 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/10/2014   Past Medical History:  Diagnosis Date  .  Anancastic neurosis   . Anginal pain ([place])   . Anxiety, generalized   . Bipolar disorder ([place])   . Chronic diarrhea   . Depression   . Hypertension   . Hypothyroidism   . Obsessive compulsive disorder   . OCD (obsessive compulsive disorder)   . Sleep apnea    patient states it was "marginal", no CPAP   Allergies  Allergen Reactions  . Penicillins Hives and Rash  . Sulfa Antibiotics Hives  . Amoxicillin Hives     Medications: Outpatient Medications Prior to Visit  Medication Sig  . Ascorbic Acid (VITAMIN C) 100 MG tablet Take 100 mg by mouth daily.  . diphenoxylate-atropine (LOMOTIL) 2.5-0.025 MG tablet Take 2 tablets by mouth 4 (four) times daily as needed for diarrhea or loose stools.  . DULoxetine (CYMBALTA) 30 MG capsule Take 30 mg by mouth daily.   . Eluxadoline (VIBERZI) 100 MG TABS Take 1 tablet (100 mg total) by mouth 2 (two) times daily.  . hyoscyamine (ANASPAZ) 0.125 MG TBDP disintergrating tablet Place 1 tablet (0.125 mg total) under the tongue every 4 (four) hours as needed.  . hyoscyamine (LEVSIN SL) 0.125 MG SL tablet PLACE 1 TABLET UNDER THE TONGUE EVERY 4 HOURS AS NEEDED.  . Multiple Vitamin (MULTIVITAMIN WITH MINERALS) TABS tablet Take 1 tablet by mouth daily.  Marland Kitchen omeprazole (PRILOSEC) 20 MG capsule Take 20 mg by mouth daily.  . rosuvastatin (CRESTOR) 10 MG tablet Take 1 tablet (10 mg total) by mouth daily.  . sildenafil (VIAGRA) 50 MG tablet   . tadalafil (CIALIS) 20 MG tablet Take one tablet every 3 days  as needed for ED.  Marland Kitchen triamcinolone cream (KENALOG) 0.5 % Apply 1 application topically 2 (two) times daily. Apply to legs  . verapamil (CALAN-SR) 240 MG CR tablet Take one tablet daily  . cariprazine (VRAYLAR) capsule  (Patient not taking: Reported on 03/13/2020)   No facility-administered medications prior to visit.    Review of Systems  Constitutional: Negative.  Negative for appetite change, chills and fever.  Respiratory: Negative.  Negative for chest  tightness, shortness of breath and wheezing.   Cardiovascular: Negative.  Negative for chest pain and palpitations.  Gastrointestinal: Positive for diarrhea (History of IBS Pt states this is stable. ). Negative for abdominal distention, abdominal pain, anal bleeding, blood in stool, constipation, nausea, rectal pain and vomiting.  Neurological: Negative for dizziness, light-headedness and headaches.      Objective    BP 120/74 (BP Location: Right Arm, Patient Position: Sitting, Cuff Size: Large)   Pulse 67   Temp 98.7 F (37.1 C) (Oral)   Wt 172 lb (78 kg)   BMI 26.94 kg/m    Physical Exam Vitals and nursing note reviewed.  HENT:     Head: Normocephalic and atraumatic.     Right Ear: External ear normal.     Left Ear: External ear normal.  Eyes:     General: No scleral icterus.    Conjunctiva/sclera: Conjunctivae normal.  Cardiovascular:     Rate and Rhythm: Normal rate and regular rhythm.     Heart sounds: Normal heart sounds.  Pulmonary:     Breath sounds: Normal breath sounds.  Musculoskeletal:     Right lower leg: No edema.     Left lower leg: No edema.     Comments: .  Skin:    General: Skin is warm and dry.     Comments: Erythematous and eczematous appearing rash in the anterior legs of both lower extremities.  The rash is at least 80 to 90% better.  Neurological:     General: No focal deficit present.     Mental Status: He is alert and oriented to person, place, and time.  Psychiatric:        Mood and Affect: Mood normal.        Behavior: Behavior normal.        Thought Content: Thought content normal.       No results found for any visits on 03/13/20.  Assessment & Plan     1. Primary hypertension Blood pressure under good control on verapamil 240.  No changes presently. Patient stopped Vraylar on his own  2. Severe recurrent major depression without psychotic features [facility]) Has follow-up with psychiatry later this month off of Vraylar  3.  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  Stop Cialis and Viagra prescription has been sent in - sildenafil (VIAGRA) 50 MG tablet; Take 1 tablet (50 mg total) by mouth daily as needed for erectile dysfunction.  Dispense: 20 tablet; Refill: 5  5. Need for influenza vaccination Follow-up in 6 months - Flu Vaccine QUAD 6+ mos PF IM (Fluarix Quad PF)   No follow-ups on file.      I, Wilhemena Durie, MD, have reviewed all documentation for this visit. The documentation on 03/14/20 for the exam, diagnosis, procedures, and orders are all accurate and complete.    Richard Cranford Mon, MD  [facility] [phone] (phone) [phone] (fax)  [place]

## 2020-03-13 ENCOUNTER — Ambulatory Visit (INDEPENDENT_AMBULATORY_CARE_PROVIDER_SITE_OTHER): Payer: BC Managed Care – PPO | Admitting: Family Medicine

## 2020-03-13 ENCOUNTER — Other Ambulatory Visit: Payer: Self-pay

## 2020-03-13 ENCOUNTER — Encounter: Payer: Self-pay | Admitting: Family Medicine

## 2020-03-13 VITALS — BP 120/74 | HR 67 | Temp 98.7°F | Wt 172.0 lb

## 2020-03-13 DIAGNOSIS — F332 Major depressive disorder, recurrent severe without psychotic features: Secondary | ICD-10-CM | POA: Diagnosis not present

## 2020-03-13 DIAGNOSIS — N5203 Combined arterial insufficiency and corporo-venous occlusive erectile dysfunction: Secondary | ICD-10-CM

## 2020-03-13 DIAGNOSIS — I10 Essential (primary) hypertension: Secondary | ICD-10-CM

## 2020-03-13 DIAGNOSIS — Z23 Encounter for immunization: Secondary | ICD-10-CM

## 2020-03-13 DIAGNOSIS — F429 Obsessive-compulsive disorder, unspecified: Secondary | ICD-10-CM | POA: Diagnosis not present

## 2020-03-13 MED ORDER — SILDENAFIL CITRATE 50 MG PO TABS: 50.0000 mg | ORAL_TABLET | Freq: Every day | ORAL | 5 refills | Status: DC | PRN

## 2020-03-14 MED ORDER — SILDENAFIL CITRATE 50 MG PO TABS: 50.0000 mg | ORAL_TABLET | ORAL | 4 refills | Status: DC | PRN

## 2020-03-23 ENCOUNTER — Other Ambulatory Visit: Payer: BC Managed Care – PPO

## 2020-03-23 ENCOUNTER — Other Ambulatory Visit: Payer: Self-pay

## 2020-03-23 DIAGNOSIS — Z20822 Contact with and (suspected) exposure to covid-19: Secondary | ICD-10-CM

## 2020-03-24 LAB — NOVEL CORONAVIRUS, NAA: SARS-CoV-2, NAA: NOT DETECTED

## 2020-03-24 LAB — SARS-COV-2, NAA 2 DAY TAT

## 2020-03-28 ENCOUNTER — Other Ambulatory Visit: Payer: Self-pay | Admitting: Family Medicine

## 2020-03-28 ENCOUNTER — Encounter: Payer: Self-pay | Admitting: Family Medicine

## 2020-03-28 DIAGNOSIS — E782 Mixed hyperlipidemia: Secondary | ICD-10-CM

## 2020-04-04 DIAGNOSIS — F331 Major depressive disorder, recurrent, moderate: Secondary | ICD-10-CM | POA: Diagnosis not present

## 2020-04-05 DIAGNOSIS — R151 Fecal smearing: Secondary | ICD-10-CM | POA: Diagnosis not present

## 2020-04-05 DIAGNOSIS — M6289 Other specified disorders of muscle: Secondary | ICD-10-CM | POA: Diagnosis not present

## 2020-04-05 DIAGNOSIS — Z6826 Body mass index (BMI) 26.0-26.9, adult: Secondary | ICD-10-CM | POA: Diagnosis not present

## 2020-04-05 DIAGNOSIS — K591 Functional diarrhea: Secondary | ICD-10-CM | POA: Diagnosis not present

## 2020-04-11 DIAGNOSIS — J329 Chronic sinusitis, unspecified: Secondary | ICD-10-CM | POA: Diagnosis not present

## 2020-04-26 DIAGNOSIS — R059 Cough, unspecified: Secondary | ICD-10-CM | POA: Diagnosis not present

## 2020-04-26 DIAGNOSIS — B9689 Other specified bacterial agents as the cause of diseases classified elsewhere: Secondary | ICD-10-CM | POA: Diagnosis not present

## 2020-04-26 DIAGNOSIS — J019 Acute sinusitis, unspecified: Secondary | ICD-10-CM | POA: Diagnosis not present

## 2020-05-15 ENCOUNTER — Telehealth: Payer: Self-pay

## 2020-05-15 NOTE — Telephone Encounter (Signed)
Please advise 

## 2020-05-15 NOTE — Telephone Encounter (Signed)
Pt. Calling to report he is still having sinus congestion. No fever. Finished antibiotics.Has had a recent COVID 19 test and it was negative. Wants to know if can get the COVID 19 booster, or should he wait. Please advise pt.

## 2020-05-17 NOTE — Telephone Encounter (Signed)
Patient was advised.  

## 2020-05-17 NOTE — Telephone Encounter (Signed)
COVID booster in the next month or so.

## 2020-05-26 ENCOUNTER — Other Ambulatory Visit: Payer: BC Managed Care – PPO

## 2020-05-26 DIAGNOSIS — Z20822 Contact with and (suspected) exposure to covid-19: Secondary | ICD-10-CM | POA: Diagnosis not present

## 2020-05-27 LAB — SARS-COV-2, NAA 2 DAY TAT

## 2020-05-27 LAB — NOVEL CORONAVIRUS, NAA: SARS-CoV-2, NAA: NOT DETECTED

## 2020-06-13 DIAGNOSIS — F331 Major depressive disorder, recurrent, moderate: Secondary | ICD-10-CM | POA: Diagnosis not present

## 2020-06-20 DIAGNOSIS — J329 Chronic sinusitis, unspecified: Secondary | ICD-10-CM | POA: Insufficient documentation

## 2020-07-03 DIAGNOSIS — F331 Major depressive disorder, recurrent, moderate: Secondary | ICD-10-CM | POA: Diagnosis not present

## 2020-07-13 DIAGNOSIS — F331 Major depressive disorder, recurrent, moderate: Secondary | ICD-10-CM | POA: Diagnosis not present

## 2020-07-18 ENCOUNTER — Ambulatory Visit: Payer: Self-pay | Admitting: Family Medicine

## 2020-08-07 ENCOUNTER — Other Ambulatory Visit: Payer: Self-pay | Admitting: Family Medicine

## 2020-08-07 DIAGNOSIS — I10 Essential (primary) hypertension: Secondary | ICD-10-CM

## 2020-09-14 ENCOUNTER — Ambulatory Visit: Payer: BC Managed Care – PPO | Admitting: Family Medicine

## 2020-09-19 DIAGNOSIS — F331 Major depressive disorder, recurrent, moderate: Secondary | ICD-10-CM | POA: Diagnosis not present

## 2020-09-20 DIAGNOSIS — Z6826 Body mass index (BMI) 26.0-26.9, adult: Secondary | ICD-10-CM | POA: Diagnosis not present

## 2020-09-20 DIAGNOSIS — Z882 Allergy status to sulfonamides status: Secondary | ICD-10-CM | POA: Diagnosis not present

## 2020-09-20 DIAGNOSIS — F32A Depression, unspecified: Secondary | ICD-10-CM | POA: Diagnosis not present

## 2020-09-20 DIAGNOSIS — I10 Essential (primary) hypertension: Secondary | ICD-10-CM | POA: Diagnosis not present

## 2020-09-20 DIAGNOSIS — K219 Gastro-esophageal reflux disease without esophagitis: Secondary | ICD-10-CM | POA: Diagnosis not present

## 2020-09-20 DIAGNOSIS — J329 Chronic sinusitis, unspecified: Secondary | ICD-10-CM | POA: Diagnosis not present

## 2020-09-20 DIAGNOSIS — G4733 Obstructive sleep apnea (adult) (pediatric): Secondary | ICD-10-CM | POA: Diagnosis not present

## 2020-09-20 DIAGNOSIS — J310 Chronic rhinitis: Secondary | ICD-10-CM | POA: Diagnosis not present

## 2020-09-20 DIAGNOSIS — F419 Anxiety disorder, unspecified: Secondary | ICD-10-CM | POA: Diagnosis not present

## 2020-09-20 DIAGNOSIS — Z880 Allergy status to penicillin: Secondary | ICD-10-CM | POA: Diagnosis not present

## 2020-09-20 DIAGNOSIS — E7800 Pure hypercholesterolemia, unspecified: Secondary | ICD-10-CM | POA: Diagnosis not present

## 2020-10-07 DIAGNOSIS — Z01818 Encounter for other preprocedural examination: Secondary | ICD-10-CM | POA: Diagnosis not present

## 2020-10-09 DIAGNOSIS — Z8249 Family history of ischemic heart disease and other diseases of the circulatory system: Secondary | ICD-10-CM | POA: Diagnosis not present

## 2020-10-09 DIAGNOSIS — K449 Diaphragmatic hernia without obstruction or gangrene: Secondary | ICD-10-CM | POA: Diagnosis not present

## 2020-10-09 DIAGNOSIS — K2270 Barrett's esophagus without dysplasia: Secondary | ICD-10-CM | POA: Diagnosis not present

## 2020-10-09 DIAGNOSIS — F32A Depression, unspecified: Secondary | ICD-10-CM | POA: Diagnosis not present

## 2020-10-09 DIAGNOSIS — J324 Chronic pansinusitis: Secondary | ICD-10-CM | POA: Diagnosis not present

## 2020-10-09 DIAGNOSIS — K219 Gastro-esophageal reflux disease without esophagitis: Secondary | ICD-10-CM | POA: Diagnosis not present

## 2020-10-09 DIAGNOSIS — E785 Hyperlipidemia, unspecified: Secondary | ICD-10-CM | POA: Diagnosis not present

## 2020-10-09 DIAGNOSIS — E7800 Pure hypercholesterolemia, unspecified: Secondary | ICD-10-CM | POA: Diagnosis not present

## 2020-10-09 DIAGNOSIS — Z818 Family history of other mental and behavioral disorders: Secondary | ICD-10-CM | POA: Diagnosis not present

## 2020-10-09 DIAGNOSIS — F419 Anxiety disorder, unspecified: Secondary | ICD-10-CM | POA: Diagnosis not present

## 2020-10-09 DIAGNOSIS — R0982 Postnasal drip: Secondary | ICD-10-CM | POA: Diagnosis not present

## 2020-10-09 DIAGNOSIS — K589 Irritable bowel syndrome without diarrhea: Secondary | ICD-10-CM | POA: Diagnosis not present

## 2020-10-09 DIAGNOSIS — R490 Dysphonia: Secondary | ICD-10-CM | POA: Diagnosis not present

## 2020-10-09 DIAGNOSIS — I10 Essential (primary) hypertension: Secondary | ICD-10-CM | POA: Diagnosis not present

## 2020-10-09 DIAGNOSIS — J329 Chronic sinusitis, unspecified: Secondary | ICD-10-CM | POA: Diagnosis not present

## 2020-10-09 DIAGNOSIS — G4733 Obstructive sleep apnea (adult) (pediatric): Secondary | ICD-10-CM | POA: Diagnosis not present

## 2020-10-09 DIAGNOSIS — J343 Hypertrophy of nasal turbinates: Secondary | ICD-10-CM | POA: Diagnosis not present

## 2020-10-09 DIAGNOSIS — J310 Chronic rhinitis: Secondary | ICD-10-CM | POA: Diagnosis not present

## 2020-10-09 HISTORY — PX: NASAL SINUS SURGERY: SHX719

## 2020-10-18 DIAGNOSIS — J324 Chronic pansinusitis: Secondary | ICD-10-CM | POA: Diagnosis not present

## 2020-10-31 DIAGNOSIS — Z20822 Contact with and (suspected) exposure to covid-19: Secondary | ICD-10-CM | POA: Diagnosis not present

## 2020-11-08 DIAGNOSIS — J324 Chronic pansinusitis: Secondary | ICD-10-CM | POA: Diagnosis not present

## 2020-11-16 ENCOUNTER — Telehealth: Payer: Self-pay

## 2020-11-16 NOTE — Telephone Encounter (Signed)
Copied from [place] [id number]. Topic: General - Other >> Nov 16, 2020 11:00 AM Lennox Solders wrote:  Reason for CRM: Pt is calling and would like to know if dr Rosanna Randy recommend him getting 2nd booster vaccine. Pt had 1st pfizer booster on 06-19-2020 also pt had nasal sinus surgery at [facility] on 10-09-2020. Please advise

## 2020-11-22 ENCOUNTER — Ambulatory Visit: Payer: BC Managed Care – PPO

## 2020-11-22 ENCOUNTER — Telehealth: Payer: Self-pay

## 2020-11-22 NOTE — Telephone Encounter (Signed)
Left message that we could do this 11/22/20 at 2:00 p.m.

## 2020-11-22 NOTE — Telephone Encounter (Signed)
Copied from [place] [phone]. Topic: Appointment Scheduling - Scheduling Inquiry for Clinic >> Nov 22, 2020 10:49 AM Erick Blinks wrote: Reason for CRM: Pt called to reschedule his appt today. Called office, and the call was answered then disconnected seconds after. Please advise, patient wants to be rescheduled for tomorrow afternoon. 2nd Booster -already had 3 Pfizer shots.   Best contact: [phone]

## 2020-11-24 NOTE — Telephone Encounter (Addendum)
Pt called  back asking about getting his 2nd covid booster.  He said he does not have an appt .  He wants to know if he can get an afternoon appt.  CB#  [phone]

## 2020-11-24 NOTE — Telephone Encounter (Signed)
Pt called back. °

## 2020-11-27 NOTE — Telephone Encounter (Signed)
Left message advising patient that we no longer give covid vaccines here in the office. Recommended any pharmacy.

## 2020-12-14 ENCOUNTER — Telehealth: Payer: Self-pay

## 2020-12-14 NOTE — Telephone Encounter (Signed)
Is this an appropriate amount for the patient to take on a daily basis? Please advise. Thanks!

## 2020-12-14 NOTE — Telephone Encounter (Signed)
Copied from [place] [phone]. Topic: General - Other >> Dec 14, 2020 11:29 AM Tessa Lerner A wrote: Reason for CRM: Patient has concerns related to their consumption of D3  Patient shares that they calculated they are taking 1100 IUs of D3 a day and have concerns related to the amount   The patient is concerned with the long term effects of consumption   The patient would like to discuss further with a member of clinical staff  Patient declined to schedule an appointment at the time of call

## 2020-12-26 DIAGNOSIS — R0981 Nasal congestion: Secondary | ICD-10-CM | POA: Insufficient documentation

## 2020-12-26 DIAGNOSIS — R053 Chronic cough: Secondary | ICD-10-CM | POA: Diagnosis not present

## 2020-12-26 DIAGNOSIS — R490 Dysphonia: Secondary | ICD-10-CM | POA: Diagnosis not present

## 2020-12-26 DIAGNOSIS — Z6825 Body mass index (BMI) 25.0-25.9, adult: Secondary | ICD-10-CM | POA: Diagnosis not present

## 2021-01-01 DIAGNOSIS — R490 Dysphonia: Secondary | ICD-10-CM | POA: Diagnosis not present

## 2021-01-09 DIAGNOSIS — J310 Chronic rhinitis: Secondary | ICD-10-CM | POA: Diagnosis not present

## 2021-01-15 ENCOUNTER — Encounter: Payer: Self-pay | Admitting: Family Medicine

## 2021-01-15 DIAGNOSIS — R490 Dysphonia: Secondary | ICD-10-CM | POA: Diagnosis not present

## 2021-01-17 DIAGNOSIS — Z20828 Contact with and (suspected) exposure to other viral communicable diseases: Secondary | ICD-10-CM | POA: Diagnosis not present

## 2021-01-29 ENCOUNTER — Other Ambulatory Visit: Payer: Self-pay | Admitting: Family Medicine

## 2021-01-29 DIAGNOSIS — E782 Mixed hyperlipidemia: Secondary | ICD-10-CM

## 2021-01-29 NOTE — Telephone Encounter (Signed)
Pharmacy requesting refills. Please review. Thanks!

## 2021-01-29 NOTE — Telephone Encounter (Signed)
  Requested medication (s) are on the active medication list: yes  Last refill:  01/08/2021  Future visit scheduled: no  Notes to clinic:  Failed protocol:  Total Cholesterol in normal range and within 360 days   LDL in normal range and within 360 days   HDL in normal range and within 360 days   Triglycerides in normal range and within 360 days     Requested Prescriptions  Pending Prescriptions Disp Refills   rosuvastatin (CRESTOR) 10 MG tablet [Pharmacy Med Name: ROSUVASTATIN CALCIUM 10 MG TAB] 90 tablet 3    Sig: TAKE 1 TABLET BY MOUTH DAILY     Cardiovascular:  Antilipid - Statins Failed - 01/29/2021 11:38 AM      Failed - Total Cholesterol in normal range and within 360 days    Cholesterol, Total  Date Value Ref Range Status  06/11/2018 154 100 - 199 mg/dL Final          Failed - LDL in normal range and within 360 days    LDL Calculated  Date Value Ref Range Status  06/11/2018 75 0 - 99 mg/dL Final          Failed - HDL in normal range and within 360 days    HDL  Date Value Ref Range Status  06/11/2018 62 >39 mg/dL Final          Failed - Triglycerides in normal range and within 360 days    Triglycerides  Date Value Ref Range Status  06/11/2018 84 0 - 149 mg/dL Final          Passed - Patient is not pregnant      Passed - Valid encounter within last 12 months    Recent Outpatient Visits           10 months ago Primary hypertension   [facility] Jerrol Banana., MD   11 months ago Mixed hyperlipidemia   [facility] Jerrol Banana., MD   1 year ago Essential hypertension   [facility] Jerrol Banana., MD   1 year ago Essential hypertension   [facility] Jerrol Banana., MD   1 year ago Dermatitis   [facility] Jerrol Banana., MD

## 2021-02-02 ENCOUNTER — Other Ambulatory Visit: Payer: Self-pay | Admitting: Family Medicine

## 2021-02-02 DIAGNOSIS — I10 Essential (primary) hypertension: Secondary | ICD-10-CM

## 2021-02-02 NOTE — Telephone Encounter (Signed)
Last OV 03/2020 has no follow up on file, will refill x 3 months noting office visit needed for additional refills.

## 2021-03-06 DIAGNOSIS — Z6826 Body mass index (BMI) 26.0-26.9, adult: Secondary | ICD-10-CM | POA: Diagnosis not present

## 2021-03-06 DIAGNOSIS — R053 Chronic cough: Secondary | ICD-10-CM | POA: Diagnosis not present

## 2021-03-07 DIAGNOSIS — Z20828 Contact with and (suspected) exposure to other viral communicable diseases: Secondary | ICD-10-CM | POA: Diagnosis not present

## 2021-03-12 ENCOUNTER — Telehealth: Payer: Self-pay

## 2021-03-12 NOTE — Telephone Encounter (Signed)
Copied from [place] [id number]. Topic: Appointment Scheduling - Scheduling Inquiry for Clinic >> Mar 12, 2021  9:40 AM Valere Dross wrote: Reason for CRM: Pt called in and scheduled Physical for 01/23, and needs labs ordered and scheduled prior to appt, please advise.

## 2021-03-12 NOTE — Telephone Encounter (Signed)
Pt called back again regarding previous messages, pt also stated he would be willing to see any provider if he could be seen sooner, please advise.

## 2021-03-12 NOTE — Telephone Encounter (Signed)
This is Dr. Alben Spittle patient. I think [facility] scheduled patient on Dr. Maralyn Sago schedule accidentally for a CPE. Patient is requesting lab orders for that CPE appointment which is in January 2023. Please advise on lab order request.Patient probably needs to be moved to Dr. Marlan Palau schedule.

## 2021-03-12 NOTE — Telephone Encounter (Signed)
Copied from [place] [phone]. Topic: General - Inquiry >> Mar 12, 2021  9:41 AM Valere Dross wrote: Reason for CRM: Pts want to know if PCP thinks its okay to get the new vaccine, stated if PCP can reach out to him.

## 2021-03-14 NOTE — Telephone Encounter (Signed)
06/2021 is the soonest we have for CPE.

## 2021-03-15 DIAGNOSIS — Z6826 Body mass index (BMI) 26.0-26.9, adult: Secondary | ICD-10-CM | POA: Diagnosis not present

## 2021-03-15 DIAGNOSIS — R159 Full incontinence of feces: Secondary | ICD-10-CM | POA: Diagnosis not present

## 2021-03-15 DIAGNOSIS — R152 Fecal urgency: Secondary | ICD-10-CM | POA: Diagnosis not present

## 2021-03-19 DIAGNOSIS — R490 Dysphonia: Secondary | ICD-10-CM | POA: Diagnosis not present

## 2021-03-21 DIAGNOSIS — R159 Full incontinence of feces: Secondary | ICD-10-CM | POA: Diagnosis not present

## 2021-03-21 DIAGNOSIS — R152 Fecal urgency: Secondary | ICD-10-CM | POA: Diagnosis not present

## 2021-03-22 NOTE — Telephone Encounter (Signed)
Patient called in to inform Dr Rosanna Randy that he has called before asking if he can go and get his 5th Covid shot the new one that is out  and what time frame would Dr say is ok, also say that he had his 4th shot on 11/30/20. Also patient stated that he had his flu shot on 03/14/21 at [facility]. Would like a call Ph# [phone] can leave detailed VM  if no answer

## 2021-03-26 NOTE — Telephone Encounter (Signed)
Reviewed physician's note with the patient. No further questions regarding the vaccine. He would like Dr. Rosanna Randy to order his yearly lab work so he can have it done just before his appointment, please. Routing to the office for orders.

## 2021-04-11 DIAGNOSIS — J310 Chronic rhinitis: Secondary | ICD-10-CM | POA: Diagnosis not present

## 2021-04-18 ENCOUNTER — Telehealth: Payer: Self-pay

## 2021-04-18 NOTE — Telephone Encounter (Signed)
Patient called back. He states he is going out of town 1 week after his scheduled appointment. He is going to be around family and he wants to be sure he doesn't have COVID before he leaves. I advised patient that we are not currently testing for COVID. Patient verbalized understanding. He now plans to have a COVID test done at a walk-in clinic or facility that does the PCR testing. I answered all of his concerns and he has no further questions.

## 2021-04-18 NOTE — Telephone Encounter (Signed)
Copied from [place] [phone]. Topic: Appointment Scheduling - Scheduling Inquiry for Clinic >> Apr 18, 2021  9:43 AM Valere Dross wrote: Reason for CRM: Pt called in stating he has appt on 11/16 @1 :40 and wants to know if he can also get a covdi test done, please advise.

## 2021-04-23 ENCOUNTER — Encounter: Payer: Medicare Other | Admitting: Family Medicine

## 2021-04-25 ENCOUNTER — Ambulatory Visit (INDEPENDENT_AMBULATORY_CARE_PROVIDER_SITE_OTHER): Payer: Medicare Other | Admitting: Physician Assistant

## 2021-04-25 ENCOUNTER — Other Ambulatory Visit: Payer: Self-pay

## 2021-04-25 ENCOUNTER — Encounter: Payer: Self-pay | Admitting: Physician Assistant

## 2021-04-25 VITALS — BP 149/99 | HR 67 | Temp 98.6°F | Ht 67.0 in | Wt 171.3 lb

## 2021-04-25 DIAGNOSIS — Z125 Encounter for screening for malignant neoplasm of prostate: Secondary | ICD-10-CM

## 2021-04-25 DIAGNOSIS — I10 Essential (primary) hypertension: Secondary | ICD-10-CM

## 2021-04-25 DIAGNOSIS — F332 Major depressive disorder, recurrent severe without psychotic features: Secondary | ICD-10-CM

## 2021-04-25 DIAGNOSIS — N5203 Combined arterial insufficiency and corporo-venous occlusive erectile dysfunction: Secondary | ICD-10-CM | POA: Insufficient documentation

## 2021-04-25 DIAGNOSIS — Z0000 Encounter for general adult medical examination without abnormal findings: Secondary | ICD-10-CM | POA: Diagnosis not present

## 2021-04-25 DIAGNOSIS — Z1159 Encounter for screening for other viral diseases: Secondary | ICD-10-CM

## 2021-04-25 DIAGNOSIS — E782 Mixed hyperlipidemia: Secondary | ICD-10-CM | POA: Diagnosis not present

## 2021-04-25 MED ORDER — SILDENAFIL CITRATE 50 MG PO TABS: 50.0000 mg | ORAL_TABLET | ORAL | 4 refills | Status: DC | PRN

## 2021-04-25 NOTE — Assessment & Plan Note (Signed)
Pt requesting siladenafil on file at pharmacy if he needs. Does not take regularly.

## 2021-04-25 NOTE — Assessment & Plan Note (Signed)
Follows with psychiatry. Not currently medicated but trying to have rexulti approved by insurance.

## 2021-04-25 NOTE — Progress Notes (Signed)
Complete physical exam   Patient: William Coleman.   DOB: December 11, 1960   60 y.o. Male  MRN: [id number] Visit Date: 04/25/2021  Today's healthcare provider: Mikey Kirschner, PA-C  Cc. CPE  Subjective    William Coleman. is a 60 y.o. male who presents today for a complete physical exam.  He reports consuming a general diet.  Reports walking when the weather permits for at least 30 minutes.  He generally feels well. He reports sleeping well. He does not have additional problems to discuss today.  He currently follows with ENT for chronic sinus issues, s/p sinus surgery 5/22 with some improvement. He also follows with GI for chronic IBS, as well as [facility].     Past Medical History:  Diagnosis Date   Anancastic neurosis    Anginal pain ([place])    Anxiety, generalized    Bipolar disorder (HCC)    Chronic diarrhea    Depression    Hypertension    Hypothyroidism    Obsessive compulsive disorder    OCD (obsessive compulsive disorder)    Sleep apnea    patient states it was "marginal", no CPAP   Past Surgical History:  Procedure Laterality Date   COLONOSCOPY WITH PROPOFOL N/A 02/05/2016   Procedure: COLONOSCOPY WITH PROPOFOL;  Surgeon: [name], MD;  Location: [facility];  Service: Endoscopy;  Laterality: N/A;   MOUTH SURGERY     orthodontic surgery for underbite   MYRINGOTOMY WITH TUBE PLACEMENT     NASAL SEPTUM SURGERY     NASAL SINUS SURGERY Bilateral 10/09/2020   POLYPECTOMY N/A 02/05/2016   Procedure: POLYPECTOMY;  Surgeon: [name], MD;  Location: [facility];  Service: Endoscopy;  Laterality: N/A;   Social History   Socioeconomic History   Marital status: Single    Spouse name: Not on file   Number of children: Not on file   Years of education: Not on file   Highest education level: Not on file  Occupational History   Not on file  Tobacco Use   Smoking status: Never   Smokeless tobacco: Never  Vaping Use    Vaping Use: Never used  Substance and Sexual Activity   Alcohol use: Yes    Comment: rarely   Drug use: No   Sexual activity: Never    Birth control/protection: Abstinence  Other Topics Concern   Not on file  Social History Narrative   Not on file   Social Determinants of Health   Financial Resource Strain: Not on file  Food Insecurity: Not on file  Transportation Needs: Not on file  Physical Activity: Not on file  Stress: Not on file  Social Connections: Not on file  Intimate Partner Violence: Not on file   Family Status  Relation Name Status   Mother  Deceased   Father  Deceased   Sister  Alive   Family History  Problem Relation Age of Onset   Depression Mother    Anxiety disorder Mother    Congestive Heart Failure Father    Prostate cancer Father    Hypertension Father    Allergies  Allergen Reactions   Penicillins Hives and Rash   Sulfa Antibiotics Hives   Amoxicillin Hives    Patient Care Team: Jerrol Banana., MD as PCP - General (Family Medicine)   Medications: Outpatient Medications Prior to Visit  Medication Sig   Ascorbic Acid (VITAMIN C) 100 MG tablet Take 100 mg by  mouth daily.   diphenoxylate-atropine (LOMOTIL) 2.5-0.025 MG tablet Take 2 tablets by mouth 4 (four) times daily as needed for diarrhea or loose stools.   Eluxadoline (VIBERZI) 100 MG TABS Take 1 tablet (100 mg total) by mouth 2 (two) times daily.   hyoscyamine (ANASPAZ) 0.125 MG TBDP disintergrating tablet Place 1 tablet (0.125 mg total) under the tongue every 4 (four) hours as needed.   hyoscyamine (LEVSIN SL) 0.125 MG SL tablet PLACE 1 TABLET UNDER THE TONGUE EVERY 4 HOURS AS NEEDED.   Multiple Vitamin (MULTIVITAMIN WITH MINERALS) TABS tablet Take 1 tablet by mouth daily.   omeprazole (PRILOSEC) 20 MG capsule Take 20 mg by mouth daily.   rosuvastatin (CRESTOR) 10 MG tablet TAKE 1 TABLET BY MOUTH DAILY   triamcinolone cream (KENALOG) 0.5 % Apply 1 application topically 2 (two)  times daily. Apply to legs   verapamil (CALAN-SR) 240 MG CR tablet TAKE 1 TABLET BY MOUTH DAILY. OFFICE VISIT NEEDED FOR ADDITIONAL REFILLS   [DISCONTINUED] sildenafil (VIAGRA) 50 MG tablet Take 1 tablet (50 mg total) by mouth as needed for erectile dysfunction.   [DISCONTINUED] cariprazine (VRAYLAR) capsule  (Patient not taking: Reported on 03/13/2020)   [DISCONTINUED] DULoxetine (CYMBALTA) 30 MG capsule Take 30 mg by mouth daily.  (Patient not taking: Reported on 04/25/2021)   [DISCONTINUED] sildenafil (VIAGRA) 50 MG tablet Take 1 tablet (50 mg total) by mouth daily as needed for erectile dysfunction.   [DISCONTINUED] tadalafil (CIALIS) 20 MG tablet Take one tablet every 3 days as needed for ED. (Patient not taking: Reported on 04/25/2021)   No facility-administered medications prior to visit.    Review of Systems  HENT:  Positive for congestion, hearing loss, postnasal drip and rhinorrhea.   Respiratory:  Positive for cough.   Gastrointestinal:  Positive for diarrhea.  Genitourinary:  Positive for frequency.  All other systems reviewed and are negative.  Last CBC Lab Results  Component Value Date   WBC 7.7 08/05/2019   HGB 15.0 08/05/2019   HCT 43.1 08/05/2019   MCV 90 08/05/2019   MCH 31.3 08/05/2019   RDW 12.5 08/05/2019   PLT 300 76/19/5093   Last metabolic panel Lab Results  Component Value Date   GLUCOSE 81 08/05/2019   NA 137 08/05/2019   K 3.5 08/05/2019   CL 98 08/05/2019   CO2 25 08/05/2019   BUN 18 08/05/2019   CREATININE 0.96 08/05/2019   GFRNONAA 87 08/05/2019   CALCIUM 9.7 08/05/2019   PROT 7.0 08/05/2019   ALBUMIN 4.6 08/05/2019   LABGLOB 2.4 08/05/2019   AGRATIO 1.9 08/05/2019   BILITOT 0.5 08/05/2019   ALKPHOS 75 08/05/2019   AST 26 08/05/2019   ALT 18 08/05/2019   ANIONGAP 11 11/24/2018   Last lipids Lab Results  Component Value Date   CHOL 154 06/11/2018   HDL 62 06/11/2018   LDLCALC 75 06/11/2018   TRIG 84 06/11/2018   CHOLHDL 2.5  06/11/2018   Last hemoglobin A1c Lab Results  Component Value Date   HGBA1C 5.1 11/14/2015   Last thyroid functions Lab Results  Component Value Date   TSH 1.160 08/05/2019   Last vitamin D No results found for: 25OHVITD2, 25OHVITD3, VD25OH Last vitamin B12 and Folate Lab Results  Component Value Date   VITAMINB12 790 01/01/2017      Objective    BP (!) 149/99   Pulse 67   Temp 98.6 F (37 C) (Oral)   Ht 5\' 7"  (1.702 m)   Wt 171 lb  4.8 oz (77.7 kg)   SpO2 99%   BMI 26.83 kg/m  BP Readings from Last 3 Encounters:  04/25/21 (!) 149/99  03/13/20 120/74  02/07/20 (!) 146/93   Wt Readings from Last 3 Encounters:  04/25/21 171 lb 4.8 oz (77.7 kg)  03/13/20 172 lb (78 kg)  02/07/20 170 lb 12.8 oz (77.5 kg)      Physical Exam Constitutional:      General: He is awake.     Appearance: He is well-developed.  HENT:     Head: Normocephalic.     Right Ear: Tympanic membrane, ear canal and external ear normal.     Left Ear: Tympanic membrane, ear canal and external ear normal.     Nose: Nose normal. No congestion or rhinorrhea.     Mouth/Throat:     Mouth: Mucous membranes are moist.     Pharynx: No oropharyngeal exudate or posterior oropharyngeal erythema.  Eyes:     Pupils: Pupils are equal, round, and reactive to light.  Cardiovascular:     Rate and Rhythm: Normal rate and regular rhythm.     Heart sounds: Normal heart sounds.  Pulmonary:     Effort: Pulmonary effort is normal.     Breath sounds: Normal breath sounds.  Abdominal:     General: There is no distension.     Palpations: Abdomen is soft.     Tenderness: There is no abdominal tenderness. There is no guarding.  Musculoskeletal:     Cervical back: Normal range of motion.     Right lower leg: No edema.     Left lower leg: No edema.  Lymphadenopathy:     Cervical: No cervical adenopathy.  Skin:    General: Skin is warm.  Neurological:     Mental Status: He is alert and oriented to person, place,  and time.  Psychiatric:        Attention and Perception: Attention normal.        Mood and Affect: Mood normal.        Speech: Speech normal.        Behavior: Behavior normal. Behavior is cooperative.     Last depression screening scores PHQ 2/9 Scores 04/25/2021 03/13/2020 07/02/2017  PHQ - 2 Score 0 0 3  PHQ- 9 Score 0 0 9   Last fall risk screening Fall Risk  04/25/2021  Falls in the past year? 0  Number falls in past yr: 0  Injury with Fall? 0  Risk for fall due to : No Fall Risks  Follow up -   Last Audit-C alcohol use screening Alcohol Use Disorder Test (AUDIT) 04/25/2021  1. How often do you have a drink containing alcohol? 0  2. How many drinks containing alcohol do you have on a typical day when you are drinking? 0  3. How often do you have six or more drinks on one occasion? 0  AUDIT-C Score 0  Alcohol Brief Interventions/Follow-up -  Some encounter information is confidential and restricted. Go to Review Flowsheets activity to see all data.   A score of 3 or more in women, and 4 or more in men indicates increased risk for alcohol abuse, EXCEPT if all of the points are from question 1   No results found for any visits on 04/25/21.  Assessment & Plan    Routine Health Maintenance and Physical Exam   Immunization History  Administered Date(s) Administered   Hep A / Hep B 07/24/2007, 08/21/2007, 02/12/2008   Hepatitis  A 07/24/2007, 08/21/2007, 02/12/2008   Hepatitis A, Adult 07/24/2007, 08/21/2007, 02/12/2008   Hepatitis B 07/24/2007, 08/21/2007, 02/12/2008   Influenza Split 03/23/2010, 02/26/2016   Influenza,inj,Quad PF,6+ Mos 03/07/2014, 03/10/2017, 02/22/2019, 03/13/2020   Influenza-Unspecified 05/13/1997, 05/11/2018, 02/23/2019, 03/13/2020, 03/14/2021   PFIZER(Purple Top)SARS-COV-2 Vaccination 11/30/2020   Td 07/02/2017   Tdap 07/24/2007    Health Maintenance  Topic Date Due   Pneumococcal Vaccine 49-42 Years old (1 - PCV) Never done   Hepatitis C  Screening  Never done   Zoster Vaccines- Shingrix (1 of 2) Never done   COVID-19 Vaccine (2 - Pfizer risk series) 12/21/2020   COLONOSCOPY (Pts 45-79yrs Insurance coverage will need to be confirmed)  02/04/2026   TETANUS/TDAP  07/03/2027   INFLUENZA VACCINE  Completed   HIV Screening  Completed   HPV VACCINES  Aged Out    Discussed health benefits of physical activity, and encouraged him to engage in regular exercise appropriate for his age and condition.  Problem List Items Addressed This Visit       Cardiovascular and Mediastinum   HTN (hypertension)    Chronic and usually well controlled with medication. Mildly elevated today 135/92 repeat 149/99. Does not currently check BP at home. Failed amlodipine d/t leg swelling, pt states Lisinopril caused cough. Hesitant to medication changes. Asked patient to check BP in AM 2-3 times a week before any caffeine or food and record. We will f/u in 3 months.      Relevant Medications   sildenafil (VIAGRA) 50 MG tablet   Other Relevant Orders   CBC w/Diff/Platelet   Combined arterial insufficiency and corporo-venous occlusive erectile dysfunction    Pt requesting siladenafil on file at pharmacy if he needs. Does not take regularly.       Relevant Medications   sildenafil (VIAGRA) 50 MG tablet     Other   HLD (hyperlipidemia)    Chronic and stable w/ Crestor. Will check lipid panel and CMP      Relevant Medications   sildenafil (VIAGRA) 50 MG tablet   Other Relevant Orders   Comprehensive Metabolic Panel (CMET)   Lipid panel   CBC w/Diff/Platelet   Severe recurrent major depression without psychotic features ([place])    Follows with psychiatry. Not currently medicated but trying to have rexulti approved by insurance.      Other Visit Diagnoses     Encounter for physical examination    -  Primary   Relevant Orders   CBC w/Diff/Platelet   PSA Total (Reflex To Free)   Encounter for hepatitis C screening test for low risk  patient       Relevant Orders   Hepatitis C antibody   Encounter for screening for malignant neoplasm of prostate       Relevant Orders   PSA Total (Reflex To Free)        Return in about 3 months (around 07/26/2021) for hypertension.     I, Mikey Kirschner, PA-C have reviewed all documentation for this visit. The documentation on  04/25/2021  for the exam, diagnosis, procedures, and orders are all accurate and complete.    Mikey Kirschner, PA-C  [facility] [phone] (phone) [phone] (fax)  [place]

## 2021-04-25 NOTE — Assessment & Plan Note (Addendum)
Chronic and usually well controlled with medication. Mildly elevated today 135/92 repeat 149/99. Does not currently check BP at home. Failed amlodipine d/t leg swelling, pt states Lisinopril caused cough. Hesitant to medication changes. Asked patient to check BP in AM 2-3 times a week before any caffeine or food and record. We will f/u in 3 months.

## 2021-04-25 NOTE — Assessment & Plan Note (Signed)
Chronic and stable w/ Crestor. Will check lipid panel and CMP

## 2021-04-25 NOTE — Patient Instructions (Signed)
Goal BP: 130s/80s

## 2021-04-26 LAB — COMPREHENSIVE METABOLIC PANEL
ALT: 34 IU/L (ref 0–44)
AST: 35 IU/L (ref 0–40)
Albumin/Globulin Ratio: 2.2 (ref 1.2–2.2)
Albumin: 4.8 g/dL (ref 3.8–4.9)
Alkaline Phosphatase: 77 IU/L (ref 44–121)
BUN/Creatinine Ratio: 18 (ref 9–20)
BUN: 18 mg/dL (ref 6–24)
Bilirubin Total: 0.6 mg/dL (ref 0.0–1.2)
CO2: 24 mmol/L (ref 20–29)
Calcium: 9.6 mg/dL (ref 8.7–10.2)
Chloride: 102 mmol/L (ref 96–106)
Creatinine, Ser: 1 mg/dL (ref 0.76–1.27)
Globulin, Total: 2.2 g/dL (ref 1.5–4.5)
Glucose: 83 mg/dL (ref 70–99)
Potassium: 4.1 mmol/L (ref 3.5–5.2)
Sodium: 141 mmol/L (ref 134–144)
Total Protein: 7 g/dL (ref 6.0–8.5)
eGFR: 87 mL/min/{1.73_m2} (ref >59)

## 2021-04-26 LAB — CBC WITH DIFFERENTIAL/PLATELET
Basophils Absolute: 0.1 10*3/uL (ref 0.0–0.2)
Basos: 1 %
EOS (ABSOLUTE): 0.1 10*3/uL (ref 0.0–0.4)
Eos: 2 %
Hematocrit: 46.2 % (ref 37.5–51.0)
Hemoglobin: 16 g/dL (ref 13.0–17.7)
Immature Grans (Abs): 0 10*3/uL (ref 0.0–0.1)
Immature Granulocytes: 0 %
Lymphocytes Absolute: 1.7 10*3/uL (ref 0.7–3.1)
Lymphs: 26 %
MCH: 31.3 pg (ref 26.6–33.0)
MCHC: 34.6 g/dL (ref 31.5–35.7)
MCV: 90 fL (ref 79–97)
Monocytes Absolute: 0.6 10*3/uL (ref 0.1–0.9)
Monocytes: 9 %
Neutrophils Absolute: 4 10*3/uL (ref 1.4–7.0)
Neutrophils: 62 %
Platelets: 250 10*3/uL (ref 150–450)
RBC: 5.12 x10E6/uL (ref 4.14–5.80)
RDW: 12.5 % (ref 11.6–15.4)
WBC: 6.5 10*3/uL (ref 3.4–10.8)

## 2021-04-26 LAB — LIPID PANEL
Chol/HDL Ratio: 2.9 ratio (ref 0.0–5.0)
Cholesterol, Total: 138 mg/dL (ref 100–199)
HDL: 48 mg/dL (ref >39)
LDL Chol Calc (NIH): 68 mg/dL (ref 0–99)
Triglycerides: 121 mg/dL (ref 0–149)
VLDL Cholesterol Cal: 22 mg/dL (ref 5–40)

## 2021-04-26 LAB — HEPATITIS C ANTIBODY: Hep C Virus Ab: <0.1 s/co ratio (ref 0.0–0.9)

## 2021-04-26 LAB — PSA TOTAL (REFLEX TO FREE): Prostate Specific Ag, Serum: 0.9 ng/mL (ref 0.0–4.0)

## 2021-04-30 ENCOUNTER — Telehealth: Payer: Self-pay

## 2021-04-30 DIAGNOSIS — Z20822 Contact with and (suspected) exposure to covid-19: Secondary | ICD-10-CM | POA: Diagnosis not present

## 2021-04-30 NOTE — Telephone Encounter (Signed)
Copied from [place] [id number]. Topic: General - Other >> Apr 30, 2021  9:46 AM William Coleman A wrote: Reason for CRM: The patient would like to be contacted by a member of staff when possible  The patient would like to discuss concerns with their prostate lab work   Please contact further

## 2021-05-02 ENCOUNTER — Other Ambulatory Visit: Payer: Self-pay | Admitting: Family Medicine

## 2021-05-02 DIAGNOSIS — I10 Essential (primary) hypertension: Secondary | ICD-10-CM

## 2021-05-08 DIAGNOSIS — R053 Chronic cough: Secondary | ICD-10-CM | POA: Diagnosis not present

## 2021-05-08 DIAGNOSIS — Z6826 Body mass index (BMI) 26.0-26.9, adult: Secondary | ICD-10-CM | POA: Diagnosis not present

## 2021-05-08 DIAGNOSIS — J310 Chronic rhinitis: Secondary | ICD-10-CM | POA: Diagnosis not present

## 2021-06-06 ENCOUNTER — Ambulatory Visit: Payer: Self-pay

## 2021-06-06 ENCOUNTER — Telehealth: Payer: BC Managed Care – PPO | Admitting: Physician Assistant

## 2021-06-06 ENCOUNTER — Telehealth: Payer: Medicare Other | Admitting: Physician Assistant

## 2021-06-06 DIAGNOSIS — U071 COVID-19: Secondary | ICD-10-CM | POA: Diagnosis not present

## 2021-06-06 MED ORDER — NIRMATRELVIR/RITONAVIR (PAXLOVID)TABLET: 3.0000 | ORAL_TABLET | Freq: Two times a day (BID) | ORAL | 0 refills | Status: AC

## 2021-06-06 MED ORDER — BENZONATATE 100 MG PO CAPS: 100.0000 mg | ORAL_CAPSULE | Freq: Three times a day (TID) | ORAL | 0 refills | Status: DC | PRN

## 2021-06-06 MED ORDER — TYLENOL SINUS SEVERE 5-325-200 MG PO TABS: 1.0000 | ORAL_TABLET | ORAL | 0 refills | Status: DC | PRN

## 2021-06-06 MED ORDER — COVID-19 AT-HOME TEST VI KIT: 1.0000 | PACK | 0 refills | Status: DC | PRN

## 2021-06-06 NOTE — Telephone Encounter (Signed)
Patient called in to say that he tested positive for Covid scratchy throat, hoarse voice, cough, sinus congestion / sinus headache. All started on 06/04/2021 but seem like its getting worst over the past few days. Please call Ph# [phone]    Chief Complaint: COVID positive Symptoms: Cough, sinus symptoms,sore throat Frequency: 06/04/21 Pertinent Negatives: Patient denies fever Disposition: [] ED /[] Urgent Care (no appt availability in office) / [] Appointment(In office/virtual)/ [x]  [facility]/ [] Home Care/ [] Refused Recommended Disposition  Additional Notes:   Reason for Disposition  MILD difficulty breathing (e.g., minimal/no SOB at rest, SOB with walking, pulse <100)  Answer Assessment - Initial Assessment Questions 1. COVID-19 DIAGNOSIS: "Who made your COVID-19 diagnosis?" "Was it confirmed by a positive lab test or self-test?" If not diagnosed by a doctor (or NP/PA), ask "Are there lots of cases (community spread) where you live?" Note: See public health department website, if unsure.     Home test yesterday 2. COVID-19 EXPOSURE: "Was there any known exposure to COVID before the symptoms began?" CDC Definition of close contact: within 6 feet (2 meters) for a total of 15 minutes or more over a 24-hour period.      No 3. ONSET: "When did the COVID-19 symptoms start?"      06/04/21 4. WORST SYMPTOM: "What is your worst symptom?" (e.g., cough, fever, shortness of breath, muscle aches)     Cough, sinus 5. COUGH: "Do you have a cough?" If Yes, ask: "How bad is the cough?"       Yes 6. FEVER: "Do you have a fever?" If Yes, ask: "What is your temperature, how was it measured, and when did it start?"     No 7. RESPIRATORY STATUS: "Describe your breathing?" (e.g., shortness of breath, wheezing, unable to speak)      No 8. BETTER-SAME-WORSE: "Are you getting better, staying the same or getting worse compared to yesterday?"  If getting worse, ask, "In what way?"     Worse 9.  HIGH RISK DISEASE: "Do you have any chronic medical problems?" (e.g., asthma, heart or lung disease, weak immune system, obesity, etc.)     No 10. VACCINE: "Have you had the COVID-19 vaccine?" If Yes, ask: "Which one, how many shots, when did you get it?"       Yes 11. BOOSTER: "Have you received your COVID-19 booster?" If Yes, ask: "Which one and when did you get it?"       Yes 12. PREGNANCY: "Is there any chance you are pregnant?" "When was your last menstrual period?"       No 13. OTHER SYMPTOMS: "Do you have any other symptoms?"  (e.g., chills, fatigue, headache, loss of smell or taste, muscle pain, sore throat)       Headache, sinus 14. O2 SATURATION MONITOR:  "Do you use an oxygen saturation monitor (pulse oximeter) at home?" If Yes, ask "What is your reading (oxygen level) today?" "What is your usual oxygen saturation reading?" (e.g., 95%)       No  Protocols used: Coronavirus (COVID-19) Diagnosed or Suspected-A-AH

## 2021-06-06 NOTE — Patient Instructions (Addendum)
Theodis Aguas [name]., thank you for joining Mar Daring, PA-C for today's virtual visit.  While this provider is not your primary care provider (PCP), if your PCP is located in our provider database this encounter information will be shared with them immediately following your visit.  Consent: (Patient) William Coleman. provided verbal consent for this virtual visit at the beginning of the encounter.  Current Medications:  Current Outpatient Medications:    benzonatate (TESSALON) 100 MG capsule, Take 1 capsule (100 mg total) by mouth 3 (three) times daily as needed., Disp: 30 capsule, Rfl: 0   COVID-19 At-Home Test KIT, 1 kit by In Vitro route as needed., Disp: 2 kit, Rfl: 0   nirmatrelvir/ritonavir EUA (PAXLOVID) 20 x 150 MG & 10 x 100MG TABS, Take 3 tablets by mouth 2 (two) times daily for 5 days. (Take nirmatrelvir 150 mg two tablets twice daily for 5 days and ritonavir 100 mg one tablet twice daily for 5 days) Patient GFR is 87, Disp: 30 tablet, Rfl: 0   Ascorbic Acid (VITAMIN C) 100 MG tablet, Take 100 mg by mouth daily., Disp: , Rfl:    diphenoxylate-atropine (LOMOTIL) 2.5-0.025 MG tablet, Take 2 tablets by mouth 4 (four) times daily as needed for diarrhea or loose stools., Disp: 240 tablet, Rfl: 5   Eluxadoline (VIBERZI) 100 MG TABS, Take 1 tablet (100 mg total) by mouth 2 (two) times daily., Disp: 60 tablet, Rfl: 5   hyoscyamine (ANASPAZ) 0.125 MG TBDP disintergrating tablet, Place 1 tablet (0.125 mg total) under the tongue every 4 (four) hours as needed., Disp: 180 tablet, Rfl: 1   hyoscyamine (LEVSIN SL) 0.125 MG SL tablet, PLACE 1 TABLET UNDER THE TONGUE EVERY 4 HOURS AS NEEDED., Disp: 180 tablet, Rfl: 1   Multiple Vitamin (MULTIVITAMIN WITH MINERALS) TABS tablet, Take 1 tablet by mouth daily., Disp: , Rfl:    omeprazole (PRILOSEC) 20 MG capsule, Take 20 mg by mouth daily., Disp: , Rfl:    rosuvastatin (CRESTOR) 10 MG tablet, TAKE 1 TABLET BY MOUTH DAILY, Disp:  90 tablet, Rfl: 3   sildenafil (VIAGRA) 50 MG tablet, Take 1 tablet (50 mg total) by mouth as needed for erectile dysfunction., Disp: 25 tablet, Rfl: 4   triamcinolone cream (KENALOG) 0.5 %, Apply 1 application topically 2 (two) times daily. Apply to legs, Disp: 30 g, Rfl: 0   verapamil (CALAN-SR) 240 MG CR tablet, TAKE 1 TABLET BY MOUTH DAILY, Disp: 90 tablet, Rfl: 3   Medications ordered in this encounter:  Meds ordered this encounter  Medications   nirmatrelvir/ritonavir EUA (PAXLOVID) 20 x 150 MG & 10 x 100MG TABS    Sig: Take 3 tablets by mouth 2 (two) times daily for 5 days. (Take nirmatrelvir 150 mg two tablets twice daily for 5 days and ritonavir 100 mg one tablet twice daily for 5 days) Patient GFR is 87    Dispense:  30 tablet    Refill:  0    Order Specific Question:   Supervising Provider    Answer:   Sabra Heck, BRIAN [[id number]]   benzonatate (TESSALON) 100 MG capsule    Sig: Take 1 capsule (100 mg total) by mouth 3 (three) times daily as needed.    Dispense:  30 capsule    Refill:  0    Order Specific Question:   Supervising Provider    Answer:   MILLER, BRIAN [[id number]]   COVID-19 At-Home Test KIT    Sig: 1 kit by In Vitro route  as needed.    Dispense:  2 kit    Refill:  0    Order Specific Question:   Supervising Provider    Answer:   Sabra Heck, BRIAN [[id number]]     *If you need refills on other medications prior to your next appointment, please contact your pharmacy*  Follow-Up: Call back or seek an in-person evaluation if the symptoms worsen or if the condition fails to improve as anticipated.  Other Instructions Paxlovid- Nirmatrelvir; Ritonavir Tablets What is this medication? NIRMATRELVIR; RITONAVIR (NIR ma TREL vir; ri TOE na veer) treats mild to moderate COVID-19. It may help people who are at high risk of developing severe illness. This medication works by limiting the spread of the virus in your body. The FDA has allowed the emergency use of this medication. This medicine  may be used for other purposes; ask your health care provider or pharmacist if you have questions. COMMON BRAND NAME(S): PAXLOVID What should I tell my care team before I take this medication? They need to know if you have any of these conditions: Any allergies Any serious illness Kidney disease Liver disease An unusual or allergic reaction to nirmatrelvir, ritonavir, other medications, foods, dyes, or preservatives Pregnant or trying to get pregnant Breast-feeding How should I use this medication? This product contains 2 different medications that are packaged together. For the standard dose, take 2 pink tablets of nirmatrelvir with 1 white tablet of ritonavir (3 tablets total) by mouth with water twice daily. Talk to your care team if you have kidney disease. You may need a different dose. Swallow the tablets whole. You can take it with or without food. If it upsets your stomach, take it with food. Take all of this medication unless your care team tells you to stop it early. Keep taking it even if you think you are better. Talk to your care team about the use of this medication in children. While it may be prescribed for children as young as 12 years for selected conditions, precautions do apply. Overdosage: If you think you have taken too much of this medicine contact a poison control center or emergency room at once. NOTE: This medicine is only for you. Do not share this medicine with others. What if I miss a dose? If you miss a dose, take it as soon as you can unless it is more than 8 hours late. If it is more than 8 hours late, skip the missed dose. Take the next dose at the normal time. Do not take extra or 2 doses at the same time to make up for the missed dose. What may interact with this medication? Do not take this medication with any of the following medications: Alfuzosin Certain medications for anxiety or sleep like midazolam, triazolam Certain medications for cancer like  apalutamide, enzalutamide Certain medications for cholesterol like lovastatin, simvastatin Certain medications for irregular heart beat like amiodarone, dronedarone, flecainide, propafenone, quinidine Certain medications for pain like meperidine, piroxicam Certain medications for psychotic disorders like clozapine, lurasidone, pimozide Certain medications for seizures like carbamazepine, phenobarbital, phenytoin Colchicine Eletriptan Eplerenone Ergot alkaloids like dihydroergotamine, ergonovine, ergotamine, methylergonovine Finerenone Flibanserin Ivabradine Lomitapide Naloxegol Ranolazine Rifampin Sildenafil Silodosin [facility]. John's Wort Tolvaptan Ubrogepant Voclosporin This medication may also interact with the following medications: Bedaquiline Birth control pills Bosentan Certain antibiotics like erythromycin or clarithromycin Certain medications for blood pressure like amlodipine, diltiazem, felodipine, nicardipine, nifedipine Certain medications for cancer like abemaciclib, ceritinib, dasatinib, encorafenib, ibrutinib, ivosidenib, neratinib, nilotinib, venetoclax, vinblastine, vincristine Certain  medications for cholesterol like atorvastatin, rosuvastatin Certain medications for depression like bupropion, trazodone Certain medications for fungal infections like isavuconazonium, itraconazole, ketoconazole, voriconazole Certain medications for hepatitis C like elbasvir; grazoprevir, dasabuvir; ombitasvir; paritaprevir; ritonavir, glecaprevir; pibrentasvir, sofosbuvir; velpatasvir; voxilaprevir Certain medications for HIV or AIDS Certain medications for irregular heartbeat like lidocaine Certain medications that treat or prevent blood clots like rivaroxaban, warfarin Digoxin Fentanyl Medications that lower your chance of fighting infection like cyclosporine, sirolimus, tacrolimus Methadone Quetiapine Rifabutin Salmeterol Steroid medications like betamethasone, budesonide,  ciclesonide, dexamethasone, fluticasone, methylprednisone, mometasone, triamcinolone This list may not describe all possible interactions. Give your health care provider a list of all the medicines, herbs, non-prescription drugs, or dietary supplements you use. Also tell them if you smoke, drink alcohol, or use illegal drugs. Some items may interact with your medicine. What should I watch for while using this medication? Your condition will be monitored carefully while you are receiving this medication. Visit your care team for regular checkups. Tell your care team if your symptoms do not start to get better or if they get worse. If you have untreated HIV infection, this medication may lead to some HIV medications not working as well in the future. Birth control may not work properly while you are taking this medication. Talk to your care team about using an extra method of birth control. What side effects may I notice from receiving this medication? Side effects that you should report to your care team as soon as possible: Allergic reactions--skin rash, itching, hives, swelling of the face, lips, tongue, or throat Liver injury--right upper belly pain, loss of appetite, nausea, light-colored stool, dark yellow or brown urine, yellowing skin or eyes, unusual weakness or fatigue Redness, blistering, peeling, or loosening of the skin, including inside the mouth Side effects that usually do not require medical attention (report these to your care team if they continue or are bothersome): Change in taste Diarrhea General discomfort and fatigue Increase in blood pressure Muscle pain Nausea Stomach pain This list may not describe all possible side effects. Call your doctor for medical advice about side effects. You may report side effects to FDA at 1-800-FDA-1088. Where should I keep my medication? Keep out of the reach of children and pets. Store at room temperature between 20 and 25 degrees C (68  and 77 degrees F). Get rid of any unused medication after the expiration date. To get rid of medications that are no longer needed or have expired: Take the medication to a medication take-back program. Check with your pharmacy or law enforcement to find a location. If you cannot return the medication, check the label or package insert to see if the medication should be thrown out in the garbage or flushed down the toilet. If you are not sure, ask your care team. If it is safe to put it in the trash, take the medication out of the container. Mix the medication with cat litter, dirt, coffee grounds, or other unwanted substance. Seal the mixture in a bag or container. Put it in the trash. NOTE: This sheet is a summary. It may not cover all possible information. If you have questions about this medicine, talk to your doctor, pharmacist, or health care provider.  2022 Elsevier/Gold Standard (2021-02-26 00:00:00)   10 Things You Can Do to Manage Your COVID-19 Symptoms at Home If you have possible or confirmed COVID-19 Stay home except to get medical care. Monitor your symptoms carefully. If your symptoms get worse, call your healthcare provider  immediately. Get rest and stay hydrated. If you have a medical appointment, call the healthcare provider ahead of time and tell them that you have or may have COVID-19. For medical emergencies, call 911 and notify the dispatch personnel that you have or may have COVID-19. Cover your cough and sneezes with a tissue or use the inside of your elbow. Wash your hands often with soap and water for at least 20 seconds or clean your hands with an alcohol-based hand sanitizer that contains at least 60% alcohol. As much as possible, stay in a specific room and away from other people in your home. Also, you should use a separate bathroom, if available. If you need to be around other people in or outside of the home, wear a mask. Avoid sharing personal items with other  people in your household, like dishes, towels, and bedding. Clean all surfaces that are touched often, like counters, tabletops, and doorknobs. Use household cleaning sprays or wipes according to the label instructions. [email or web address] 12/24/2019 This information is not intended to replace advice given to you by your health care provider. Make sure you discuss any questions you have with your health care provider. Document Revised: 02/16/2021 Document Reviewed: 02/16/2021 Elsevier Patient Education  2022 [employer].    If you have been instructed to have an in-person evaluation today at a local Urgent Care facility, please use the link below. It will take you to a list of all of our available [facility], including address, phone number and hours of operation. Please do not delay care.  [facility]  If you or a family member do not have a primary care provider, use the link below to schedule a visit and establish care. When you choose a [facility] primary care physician or advanced practice provider, you gain a long-term partner in health. Find a Primary Care Provider  Learn more about [facility]'s in-office and virtual care options: [facility]

## 2021-06-06 NOTE — Progress Notes (Signed)
Virtual Visit Consent   [name]., you are scheduled for a virtual visit with a [facility] provider today.     Just as with appointments in the office, your consent must be obtained to participate.  Your consent will be active for this visit and any virtual visit you may have with one of our providers in the next 365 days.     If you have a MyChart account, a copy of this consent can be sent to you electronically.  All virtual visits are billed to your insurance company just like a traditional visit in the office.    As this is a virtual visit, video technology does not allow for your provider to perform a traditional examination.  This may limit your provider's ability to fully assess your condition.  If your provider identifies any concerns that need to be evaluated in person or the need to arrange testing (such as labs, EKG, etc.), we will make arrangements to do so.     Although advances in technology are sophisticated, we cannot ensure that it will always work on either your end or our end.  If the connection with a video visit is poor, the visit may have to be switched to a telephone visit.  With either a video or telephone visit, we are not always able to ensure that we have a secure connection.     I need to obtain your verbal consent now.   Are you willing to proceed with your visit today?    William Coleman. has provided verbal consent on 06/06/2021 for a virtual visit (video or telephone).   Mar Daring, PA-C   Date: 06/06/2021 4:05 PM   Virtual Visit via Video Note   I, Mar Daring, connected with  William Coleman.  ([id number], Oct 20, 1960) on 06/06/21 at  3:45 PM EST by a video-enabled telemedicine application and verified that I am speaking with the correct person using two identifiers.  Location: Patient: Virtual Visit Location Patient: Home Provider: Virtual Visit Location Provider: Home Office   I discussed the  limitations of evaluation and management by telemedicine and the availability of in person appointments. The patient expressed understanding and agreed to proceed.    History of Present Illness: William Coleman. is a 60 y.o. who identifies as a male who was assigned male at birth, and is being seen today for Covid 35.  HPI: URI  This is a new problem. Episode onset: symptoms started couple days ago; Tested positive for Covid 19 yesterday afternoon. The problem has been gradually worsening. There has been no fever. Associated symptoms include congestion, coughing, diarrhea (mild), headaches, a plugged ear sensation, rhinorrhea, sinus pain and a sore throat. Pertinent negatives include no ear pain, nausea or vomiting. Associated symptoms comments: Post nasal drainage, hoarse voice. Treatments tried: tylenol, vit c. The treatment provided no relief.     Problems:  Patient Active Problem List   Diagnosis Date Noted   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   MDD (major depressive disorder), recurrent severe, without psychosis ([place]) 11/18/2018   Laryngopharyngeal reflux (LPR) 01/01/2018   Deviated nasal septum 01/01/2018   Non-seasonal allergic rhinitis 01/01/2018   Incontinence of feces 11/10/2017   OCD (obsessive compulsive disorder) 01/01/2017   Severe recurrent major depression without psychotic features ([place]) 11/10/2015   HLD (hyperlipidemia) 11/10/2014   HTN (hypertension) 11/10/2014   Adult hypothyroidism 11/10/2014   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/10/2014  Allergies:  Allergies  Allergen Reactions   Penicillins Hives and Rash   Sulfa Antibiotics Hives   Amoxicillin Hives   Medications:  Current Outpatient Medications:    benzonatate (TESSALON) 100 MG capsule, Take 1 capsule (100 mg total) by mouth 3 (three) times daily as needed., Disp: 30 capsule, Rfl: 0   COVID-19 At-Home Test KIT, 1 kit by In Vitro route as needed., Disp:  2 kit, Rfl: 0   nirmatrelvir/ritonavir EUA (PAXLOVID) 20 x 150 MG & 10 x $Re'100MG'Thy$  TABS, Take 3 tablets by mouth 2 (two) times daily for 5 days. (Take nirmatrelvir 150 mg two tablets twice daily for 5 days and ritonavir 100 mg one tablet twice daily for 5 days) Patient GFR is 87, Disp: 30 tablet, Rfl: 0   Phenylephrine-APAP-guaiFENesin (TYLENOL SINUS SEVERE) 5-325-200 MG TABS, Take 1 tablet by mouth every 4 (four) hours as needed., Disp: 30 tablet, Rfl: 0   Ascorbic Acid (VITAMIN C) 100 MG tablet, Take 100 mg by mouth daily., Disp: , Rfl:    diphenoxylate-atropine (LOMOTIL) 2.5-0.025 MG tablet, Take 2 tablets by mouth 4 (four) times daily as needed for diarrhea or loose stools., Disp: 240 tablet, Rfl: 5   Eluxadoline (VIBERZI) 100 MG TABS, Take 1 tablet (100 mg total) by mouth 2 (two) times daily., Disp: 60 tablet, Rfl: 5   hyoscyamine (ANASPAZ) 0.125 MG TBDP disintergrating tablet, Place 1 tablet (0.125 mg total) under the tongue every 4 (four) hours as needed., Disp: 180 tablet, Rfl: 1   hyoscyamine (LEVSIN SL) 0.125 MG SL tablet, PLACE 1 TABLET UNDER THE TONGUE EVERY 4 HOURS AS NEEDED., Disp: 180 tablet, Rfl: 1   Multiple Vitamin (MULTIVITAMIN WITH MINERALS) TABS tablet, Take 1 tablet by mouth daily., Disp: , Rfl:    omeprazole (PRILOSEC) 20 MG capsule, Take 20 mg by mouth daily., Disp: , Rfl:    rosuvastatin (CRESTOR) 10 MG tablet, TAKE 1 TABLET BY MOUTH DAILY, Disp: 90 tablet, Rfl: 3   sildenafil (VIAGRA) 50 MG tablet, Take 1 tablet (50 mg total) by mouth as needed for erectile dysfunction., Disp: 25 tablet, Rfl: 4   triamcinolone cream (KENALOG) 0.5 %, Apply 1 application topically 2 (two) times daily. Apply to legs, Disp: 30 g, Rfl: 0   verapamil (CALAN-SR) 240 MG CR tablet, TAKE 1 TABLET BY MOUTH DAILY, Disp: 90 tablet, Rfl: 3  Observations/Objective: Patient is well-developed, well-nourished in no acute distress.  Resting comfortably at home.  Head is normocephalic, atraumatic.  No labored  breathing.  Speech is clear and coherent with logical content.  Patient is alert and oriented at baseline.    Assessment and Plan: 1. COVID-19 - nirmatrelvir/ritonavir EUA (PAXLOVID) 20 x 150 MG & 10 x $Re'100MG'PAL$  TABS; Take 3 tablets by mouth 2 (two) times daily for 5 days. (Take nirmatrelvir 150 mg two tablets twice daily for 5 days and ritonavir 100 mg one tablet twice daily for 5 days) Patient GFR is 87  Dispense: 30 tablet; Refill: 0 - benzonatate (TESSALON) 100 MG capsule; Take 1 capsule (100 mg total) by mouth 3 (three) times daily as needed.  Dispense: 30 capsule; Refill: 0 - COVID-19 At-Home Test KIT; 1 kit by In Vitro route as needed.  Dispense: 2 kit; Refill: 0  - Continue OTC symptomatic management of choice - Will send OTC vitamins and supplement information through AVS - Paxlovid, tessalon perles, tylenol sinus prescribed - Patient enrolled in MyChart symptom monitoring - Push fluids - Rest as needed - Discussed return precautions and when  to seek in-person evaluation, sent via AVS as well   Follow Up Instructions: I discussed the assessment and treatment plan with the patient. The patient was provided an opportunity to ask questions and all were answered. The patient agreed with the plan and demonstrated an understanding of the instructions.  A copy of instructions were sent to the patient via MyChart unless otherwise noted below.   The patient was advised to call back or seek an in-person evaluation if the symptoms worsen or if the condition fails to improve as anticipated.  Time:  I spent 15 minutes with the patient via telehealth technology discussing the above problems/concerns.    Mar Daring, PA-C

## 2021-06-07 NOTE — Progress Notes (Signed)
No show

## 2021-06-13 ENCOUNTER — Ambulatory Visit: Payer: Self-pay

## 2021-06-13 NOTE — Telephone Encounter (Signed)
°  Chief Complaint: COVID Symptoms: congestion, sore throat, cough,  Frequency: 2 weeks Pertinent Negatives: Patient denies fever, SOB Disposition: [] ED /[x] Urgent Care (no appt availability in office) / [] Appointment(In office/virtual)/ []  [facility]/ [] Home Care/ [] Refused Recommended Disposition /[] [facility]/ []  Follow-up with PCP Additional Notes: Pt was advised UC d/t no available appt at office until 06/18/21. Pt did virtual visit on 06/06/21 and was advised if symptoms persisted to be seen in person. Scheduled UC appt for 06/14/21 at 1500. Care advice given and pt verbalized understanding. No other questions/concerns noted.       Reason for Disposition  [1] PERSISTING SYMPTOMS OF COVID-19 AND [2] symptoms WORSE  Answer Assessment - Initial Assessment Questions 1. COVID-19 ONSET: "When did the symptoms of COVID-19 first start?"     06/05/21 4. SYMPTOM ONSET: "When did the  sx  start?"     06/05/21 5. BETTER-SAME-WORSE: "Are you getting better, staying the same, or getting worse over the last 1 to 2 weeks?"     worse 6. RECENT MEDICAL VISIT: "Have you been seen by a healthcare provider (doctor, NP, PA) for these persisting COVID-19 symptoms?" If Yes, ask: "When were you seen?" (e.g., date)     06/05/21 7. COUGH: "Do you have a cough?" If Yes, ask: "How bad is the cough?"       yes 8. FEVER: "Do you have a fever?" If Yes, ask: "What is your temperature, how was it measured, and when did it start?"     No 9. BREATHING DIFFICULTY: "Are you having any trouble breathing?" If Yes, ask: "How bad is your breathing?" (e.g., mild, moderate, severe)    - MILD: No SOB at rest, mild SOB with walking, speaks normally in sentences, can lie down, no retractions, pulse < 100.    - MODERATE: SOB at rest, SOB with minimal exertion and prefers to sit, cannot lie down flat, speaks in phrases, mild retractions, audible wheezing, pulse 100-120.    - SEVERE: Very SOB at rest, speaks  in single words, struggling to breathe, sitting hunched forward, retractions, pulse > 120       No 14. OTHER SYMPTOMS: "Do you have any other symptoms?"  (e.g., fatigue, headache, muscle pain, weakness)       Congestion, sore throat, sinus draiange  Protocols used: Coronavirus (COVID-19) Persisting Symptoms Follow-up Call-[facility]

## 2021-06-14 ENCOUNTER — Ambulatory Visit
Admission: RE | Admit: 2021-06-14 | Discharge: 2021-06-14 | Disposition: A | Discharge status: Home / Self Care | Payer: Medicare Other | Source: Ambulatory Visit | Attending: Emergency Medicine | Admitting: Emergency Medicine

## 2021-06-14 ENCOUNTER — Other Ambulatory Visit: Payer: Self-pay

## 2021-06-14 VITALS — BP 156/93 | HR 76 | Temp 98.1°F | Resp 18

## 2021-06-14 DIAGNOSIS — U071 COVID-19: Secondary | ICD-10-CM | POA: Diagnosis not present

## 2021-06-14 DIAGNOSIS — R051 Acute cough: Secondary | ICD-10-CM | POA: Diagnosis not present

## 2021-06-14 DIAGNOSIS — J0100 Acute maxillary sinusitis, unspecified: Secondary | ICD-10-CM | POA: Diagnosis not present

## 2021-06-14 MED ORDER — AZITHROMYCIN 250 MG PO TABS: 250.0000 mg | ORAL_TABLET | Freq: Every day | ORAL | 0 refills | Status: DC

## 2021-06-14 NOTE — ED Triage Notes (Signed)
Covid positive patient of 10 days who took Paxlovid is here with continuation of cough and nasal congestion.

## 2021-06-14 NOTE — ED Provider Notes (Signed)
Roderic Palau    CSN: [id number] Arrival date & time: 06/14/21  1445      History   Chief Complaint Chief Complaint  Patient presents with   Covid Positive   Cough   Nasal Congestion    HPI William Aguas Raeden Coleman. is a 61 y.o. male.  Patient presents with 10-day history of congestion, postnasal drip, runny nose, cough.  No fever, chest pain, shortness of breath or other symptoms.  Treatment at home with [facility].  Patient had a telehealth visit on 06/06/2021; he tested positive for COVID at home on 06/05/2021; he was treated with Paxlovid and symptomatic medicaiton.  His medical history also includes hypertension, bipolar disorder, anxiety, OCD, hypothyroidism, chronic diarrhea.  The history is provided by the patient and medical records.   Past Medical History:  Diagnosis Date   Anancastic neurosis    Anginal pain ([place])    Anxiety, generalized    Bipolar disorder ([place])    Chronic diarrhea    Depression    Hypertension    Hypothyroidism    Obsessive compulsive disorder    OCD (obsessive compulsive disorder)    Sleep apnea    patient states it was "marginal", no CPAP    Patient Active Problem List   Diagnosis Date Noted   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   MDD (major depressive disorder), recurrent severe, without psychosis ([place]) 11/18/2018   Laryngopharyngeal reflux (LPR) 01/01/2018   Deviated nasal septum 01/01/2018   Non-seasonal allergic rhinitis 01/01/2018   Incontinence of feces 11/10/2017   OCD (obsessive compulsive disorder) 01/01/2017   Severe recurrent major depression without psychotic features ([place]) 11/10/2015   HLD (hyperlipidemia) 11/10/2014   HTN (hypertension) 11/10/2014   Adult hypothyroidism 11/10/2014   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/10/2014    Past Surgical History:  Procedure Laterality Date   COLONOSCOPY WITH PROPOFOL N/A 02/05/2016   Procedure: COLONOSCOPY WITH PROPOFOL;   Surgeon: [name], MD;  Location: [place];  Service: Endoscopy;  Laterality: N/A;   MOUTH SURGERY     orthodontic surgery for underbite   MYRINGOTOMY WITH TUBE PLACEMENT     NASAL SEPTUM SURGERY     NASAL SINUS SURGERY Bilateral 10/09/2020   POLYPECTOMY N/A 02/05/2016   Procedure: POLYPECTOMY;  Surgeon: [name], MD;  Location: [place] [facility];  Service: Endoscopy;  Laterality: N/A;       Home Medications    Prior to Admission medications   Medication Sig Start Date End Date Taking? Authorizing Provider  azithromycin (ZITHROMAX) 250 MG tablet Take 1 tablet (250 mg total) by mouth daily. Take first 2 tablets together, then 1 every day until finished. 06/14/21  Yes Sharion Balloon, NP  Ascorbic Acid (VITAMIN C) 100 MG tablet Take 100 mg by mouth daily.    [provider]  benzonatate (TESSALON) 100 MG capsule Take 1 capsule (100 mg total) by mouth 3 (three) times daily as needed. 06/06/21   Mar Daring, PA-C  COVID-19 At-Home Test KIT 1 kit by In Vitro route as needed. 06/06/21   Mar Daring, PA-C  diphenoxylate-atropine (LOMOTIL) 2.5-0.025 MG tablet Take 2 tablets by mouth 4 (four) times daily as needed for diarrhea or loose stools. 07/30/18   [name], MD  Eluxadoline (VIBERZI) 100 MG TABS Take 1 tablet (100 mg total) by mouth 2 (two) times daily. 07/30/18   [name], MD  hyoscyamine (ANASPAZ) 0.125 MG TBDP disintergrating tablet Place 1 tablet (0.125 mg total) under the  tongue every 4 (four) hours as needed. 10/09/18   Jerrol Banana., MD  hyoscyamine (LEVSIN SL) 0.125 MG SL tablet PLACE 1 TABLET UNDER THE TONGUE EVERY 4 HOURS AS NEEDED. 03/22/19   Jerrol Banana., MD  Multiple Vitamin (MULTIVITAMIN WITH MINERALS) TABS tablet Take 1 tablet by mouth daily.    [provider]  omeprazole (PRILOSEC) 20 MG capsule Take 20 mg by mouth daily.    [provider]  Phenylephrine-APAP-guaiFENesin (TYLENOL SINUS SEVERE)  5-325-200 MG TABS Take 1 tablet by mouth every 4 (four) hours as needed. 06/06/21   Mar Daring, PA-C  rosuvastatin (CRESTOR) 10 MG tablet TAKE 1 TABLET BY MOUTH DAILY 01/29/21   Jerrol Banana., MD  sildenafil (VIAGRA) 50 MG tablet Take 1 tablet (50 mg total) by mouth as needed for erectile dysfunction. 04/25/21   Mikey Kirschner, PA-C  triamcinolone cream (KENALOG) 0.5 % Apply 1 application topically 2 (two) times daily. Apply to legs 08/05/19   Jerrol Banana., MD  verapamil (CALAN-SR) 240 MG CR tablet TAKE 1 TABLET BY MOUTH DAILY 05/02/21   Jerrol Banana., MD    Family History Family History  Problem Relation Age of Onset   Depression Mother    Anxiety disorder Mother    Congestive Heart Failure Father    Prostate cancer Father    Hypertension Father     Social History Social History   Tobacco Use   Smoking status: Never   Smokeless tobacco: Never  Vaping Use   Vaping Use: Never used  Substance Use Topics   Alcohol use: Yes    Comment: rarely   Drug use: No     Allergies   Penicillins, Sulfa antibiotics, and Amoxicillin   Review of Systems Review of Systems  Constitutional:  Negative for chills and fever.  HENT:  Positive for congestion, postnasal drip and rhinorrhea. Negative for ear pain and sore throat.   Respiratory:  Positive for cough. Negative for shortness of breath.   Cardiovascular:  Negative for chest pain and palpitations.  Gastrointestinal:  Negative for diarrhea and vomiting.  Skin:  Negative for color change and rash.  All other systems reviewed and are negative.   Physical Exam Triage Vital Signs ED Triage Vitals  Enc Vitals Group     BP      Pulse      Resp      Temp      Temp src      SpO2      Weight      Height      Head Circumference      Peak Flow      Pain Score      Pain Loc      Pain Edu?      Excl. in [place]?    No data found.  Updated Vital Signs BP (!) 156/93 (BP Location: Left Arm)    Pulse  76    Temp 98.1 F (36.7 C)    Resp 18    SpO2 95%   Visual Acuity Right Eye Distance:   Left Eye Distance:   Bilateral Distance:    Right Eye Near:   Left Eye Near:    Bilateral Near:     Physical Exam Vitals and nursing note reviewed.  Constitutional:      General: He is not in acute distress.    Appearance: He is well-developed. He is not ill-appearing.  HENT:  Right Ear: Tympanic membrane normal.     Left Ear: Tympanic membrane normal.     Nose: Rhinorrhea present.     Mouth/Throat:     Mouth: Mucous membranes are moist.     Pharynx: Oropharynx is clear.  Cardiovascular:     Rate and Rhythm: Normal rate and regular rhythm.     Heart sounds: Normal heart sounds.  Pulmonary:     Effort: Pulmonary effort is normal. No respiratory distress.     Breath sounds: Normal breath sounds.  Musculoskeletal:     Cervical back: Neck supple.  Skin:    General: Skin is warm and dry.  Neurological:     Mental Status: He is alert.  Psychiatric:        Mood and Affect: Mood normal.        Behavior: Behavior normal.     UC Treatments / Results  Labs (all labs ordered are listed, but only abnormal results are displayed) Labs Reviewed - No data to display  EKG   Radiology No results found.  Procedures Procedures (including critical care time)  Medications Ordered in UC Medications - No data to display  Initial Impression / Assessment and Plan / UC Course  I have reviewed the triage vital signs and the nursing notes.  Pertinent labs & imaging results that were available during my care of the patient were reviewed by me and considered in my medical decision making (see chart for details).   COVID-19, sinusitis, cough.  Patient tested positive for COVID on 06/05/2021; symptom onset that same day.  He was treated with Paxlovid after having a telehealth visit on 06/06/2021.  He has been taking Tessalon Perles for the cough.  Patient is on day 10 of his symptoms and is not  improving.  Treating with Zithromax.  Education provided on [employer] guidelines for quarantine.  Instructed him to follow-up with his PCP.  He agrees to plan of care.   Final Clinical Impressions(s) / UC Diagnoses   Final diagnoses:  COVID-19  Acute non-recurrent maxillary sinusitis  Acute cough     Discharge Instructions      Take the Zithromax as directed.  Follow up with your primary care provider if your symptoms are not improving.         ED Prescriptions     Medication Sig Dispense Auth. Provider   azithromycin (ZITHROMAX) 250 MG tablet Take 1 tablet (250 mg total) by mouth daily. Take first 2 tablets together, then 1 every day until finished. 6 tablet Sharion Balloon, NP      PDMP not reviewed this encounter.   Sharion Balloon, NP 06/14/21 1549

## 2021-06-14 NOTE — Discharge Instructions (Addendum)
Take the Zithromax as directed.  Follow up with your primary care provider if your symptoms are not improving.    

## 2021-06-19 DIAGNOSIS — Z20822 Contact with and (suspected) exposure to covid-19: Secondary | ICD-10-CM | POA: Diagnosis not present

## 2021-06-19 DIAGNOSIS — Z03818 Encounter for observation for suspected exposure to other biological agents ruled out: Secondary | ICD-10-CM | POA: Diagnosis not present

## 2021-06-26 DIAGNOSIS — J301 Allergic rhinitis due to pollen: Secondary | ICD-10-CM | POA: Diagnosis not present

## 2021-06-27 DIAGNOSIS — Z03818 Encounter for observation for suspected exposure to other biological agents ruled out: Secondary | ICD-10-CM | POA: Diagnosis not present

## 2021-06-27 DIAGNOSIS — Z20822 Contact with and (suspected) exposure to covid-19: Secondary | ICD-10-CM | POA: Diagnosis not present

## 2021-06-29 DIAGNOSIS — F332 Major depressive disorder, recurrent severe without psychotic features: Secondary | ICD-10-CM | POA: Diagnosis not present

## 2021-06-29 DIAGNOSIS — F331 Major depressive disorder, recurrent, moderate: Secondary | ICD-10-CM | POA: Diagnosis not present

## 2021-06-29 DIAGNOSIS — F422 Mixed obsessional thoughts and acts: Secondary | ICD-10-CM | POA: Diagnosis not present

## 2021-06-29 DIAGNOSIS — F451 Undifferentiated somatoform disorder: Secondary | ICD-10-CM | POA: Diagnosis not present

## 2021-07-02 ENCOUNTER — Encounter: Payer: Medicare Other | Admitting: Family Medicine

## 2021-07-10 DIAGNOSIS — Z03818 Encounter for observation for suspected exposure to other biological agents ruled out: Secondary | ICD-10-CM | POA: Diagnosis not present

## 2021-07-10 DIAGNOSIS — Z20822 Contact with and (suspected) exposure to covid-19: Secondary | ICD-10-CM | POA: Diagnosis not present

## 2021-07-24 NOTE — Progress Notes (Signed)
Established patient visit   Patient: William Coleman.   DOB: 1961-04-07   61 y.o. Male  MRN: [id number] Visit Date: 07/26/2021  Today's healthcare provider: Wilhemena Durie, MD   No chief complaint on file.  Subjective    HPI  Patient comes in today for follow-up.  He continues to complain of chronic sinus issues.  Not many GI complaints today.  Home blood pressures run 130s over high 80s at home.  He feels well and is comfortable with his present regimen.  Hypertension, follow-up  BP Readings from Last 3 Encounters:  07/26/21 130/82  06/14/21 (!) 156/93  04/25/21 (!) 149/99   Wt Readings from Last 3 Encounters:  07/26/21 167 lb 11.2 oz (76.1 kg)  04/25/21 171 lb 4.8 oz (77.7 kg)  03/13/20 172 lb (78 kg)     He was last seen for hypertension 3 months ago.  BP at that visit was 149/99. Management since that visit includes; Chronic and usually well controlled with medication. Mildly elevated today 135/92 repeat 149/99. Does not currently check BP at home. Failed amlodipine d/t leg swelling, pt states Lisinopril caused cough. Hesitant to medication changes. Asked patient to check BP in AM 2-3 times a week before any caffeine or food and record.  He reports good compliance with treatment. He is not having side effects. None.  He is following a Low Sodium diet. He is exercising. He does not smoke.  Outside blood pressures are not checked regularly. Patient checked BP at home over the last week before appointment and averaged 135/85  Pertinent labs: Lab Results  Component Value Date   CHOL 138 04/25/2021   HDL 48 04/25/2021   LDLCALC 68 04/25/2021   TRIG 121 04/25/2021   CHOLHDL 2.9 04/25/2021   Lab Results  Component Value Date   NA 141 04/25/2021   K 4.1 04/25/2021   CREATININE 1.00 04/25/2021   EGFR 87 04/25/2021   GLUCOSE 83 04/25/2021   TSH 1.160 08/05/2019     The 10-year ASCVD risk score (Arnett DK, et al., 2019) is: 6.9%    ---------------------------------------------------------------------------------------------------   Medications: Outpatient Medications Prior to Visit  Medication Sig   Ascorbic Acid (VITAMIN C) 100 MG tablet Take 100 mg by mouth daily.   diphenoxylate-atropine (LOMOTIL) 2.5-0.025 MG tablet Take 2 tablets by mouth 4 (four) times daily as needed for diarrhea or loose stools.   Eluxadoline (VIBERZI) 100 MG TABS Take 1 tablet (100 mg total) by mouth 2 (two) times daily.   hyoscyamine (LEVSIN SL) 0.125 MG SL tablet PLACE 1 TABLET UNDER THE TONGUE EVERY 4 HOURS AS NEEDED.   Multiple Vitamin (MULTIVITAMIN WITH MINERALS) TABS tablet Take 1 tablet by mouth daily.   omeprazole (PRILOSEC) 20 MG capsule Take 20 mg by mouth daily.   rosuvastatin (CRESTOR) 10 MG tablet TAKE 1 TABLET BY MOUTH DAILY   sildenafil (VIAGRA) 50 MG tablet Take 1 tablet (50 mg total) by mouth as needed for erectile dysfunction.   verapamil (CALAN-SR) 240 MG CR tablet TAKE 1 TABLET BY MOUTH DAILY   azithromycin (ZITHROMAX) 250 MG tablet Take 1 tablet (250 mg total) by mouth daily. Take first 2 tablets together, then 1 every day until finished. (Patient not taking: Reported on 07/26/2021)   benzonatate (TESSALON) 100 MG capsule Take 1 capsule (100 mg total) by mouth 3 (three) times daily as needed. (Patient not taking: Reported on 07/26/2021)   COVID-19 At-Home Test KIT 1 kit by In Vitro route  as needed. (Patient not taking: Reported on 07/26/2021)   hyoscyamine (ANASPAZ) 0.125 MG TBDP disintergrating tablet Place 1 tablet (0.125 mg total) under the tongue every 4 (four) hours as needed. (Patient not taking: Reported on 07/26/2021)   Phenylephrine-APAP-guaiFENesin (TYLENOL SINUS SEVERE) 5-325-200 MG TABS Take 1 tablet by mouth every 4 (four) hours as needed. (Patient not taking: Reported on 07/26/2021)   triamcinolone cream (KENALOG) 0.5 % Apply 1 application topically 2 (two) times daily. Apply to legs (Patient not taking: Reported  on 07/26/2021)   No facility-administered medications prior to visit.    Review of Systems  All other systems reviewed and are negative.  Last lipids Lab Results  Component Value Date   CHOL 138 04/25/2021   HDL 48 04/25/2021   LDLCALC 68 04/25/2021   TRIG 121 04/25/2021   CHOLHDL 2.9 04/25/2021       Objective    BP 130/82 (BP Location: Right Arm, Patient Position: Sitting, Cuff Size: Normal)    Pulse (!) 59    Temp 98.3 F (36.8 C) (Temporal)    Resp 14    Ht 5' 7"  (1.702 m)    Wt 167 lb 11.2 oz (76.1 kg)    SpO2 95%    BMI 26.27 kg/m  BP Readings from Last 3 Encounters:  07/26/21 130/82  06/14/21 (!) 156/93  04/25/21 (!) 149/99   Wt Readings from Last 3 Encounters:  07/26/21 167 lb 11.2 oz (76.1 kg)  04/25/21 171 lb 4.8 oz (77.7 kg)  03/13/20 172 lb (78 kg)      Physical Exam Vitals and nursing note reviewed.  HENT:     Head: Normocephalic and atraumatic.     Right Ear: External ear normal.     Left Ear: External ear normal.  Eyes:     General: No scleral icterus.    Conjunctiva/sclera: Conjunctivae normal.  Cardiovascular:     Rate and Rhythm: Normal rate and regular rhythm.     Heart sounds: Normal heart sounds.  Pulmonary:     Breath sounds: Normal breath sounds.  Musculoskeletal:     Right lower leg: No edema.     Left lower leg: No edema.  Skin:    General: Skin is warm and dry.  Neurological:     General: No focal deficit present.     Mental Status: He is alert and oriented to person, place, and time.  Psychiatric:        Mood and Affect: Mood normal.        Behavior: Behavior normal.        Thought Content: Thought content normal.      No results found for any visits on 07/26/21.  Assessment & Plan     1. Primary hypertension Fairly good control.  Recommend regular exercise.  Follow-up later this year for  2. Mixed hyperlipidemia   3. Adult hypothyroidism Treat for euthyroid TSH.  4. Obsessive-compulsive disorder, unspecified  type Followed by psychiatry  5. MDD (major depressive disorder), recurrent severe, without psychosis ([place]) In remission.  6. Non-seasonal allergic rhinitis, unspecified trigger Followed by ENT. 7.  IBS Treated and followed by GI.  No follow-ups on file.      I, Wilhemena Durie, MD, have reviewed all documentation for this visit. The documentation on 07/31/21 for the exam, diagnosis, procedures, and orders are all accurate and complete.    Melbert Botelho Cranford Mon, MD  [facility] [phone] (phone) [phone] (fax)  [place]

## 2021-07-26 ENCOUNTER — Encounter: Payer: Self-pay | Admitting: Family Medicine

## 2021-07-26 ENCOUNTER — Other Ambulatory Visit: Payer: Self-pay

## 2021-07-26 ENCOUNTER — Ambulatory Visit (INDEPENDENT_AMBULATORY_CARE_PROVIDER_SITE_OTHER): Payer: Medicare Other | Admitting: Family Medicine

## 2021-07-26 VITALS — BP 130/82 | HR 59 | Temp 98.3°F | Resp 14 | Ht 67.0 in | Wt 167.7 lb

## 2021-07-26 DIAGNOSIS — F332 Major depressive disorder, recurrent severe without psychotic features: Secondary | ICD-10-CM

## 2021-07-26 DIAGNOSIS — E782 Mixed hyperlipidemia: Secondary | ICD-10-CM

## 2021-07-26 DIAGNOSIS — E039 Hypothyroidism, unspecified: Secondary | ICD-10-CM

## 2021-07-26 DIAGNOSIS — J3089 Other allergic rhinitis: Secondary | ICD-10-CM

## 2021-07-26 DIAGNOSIS — K580 Irritable bowel syndrome with diarrhea: Secondary | ICD-10-CM

## 2021-07-26 DIAGNOSIS — I10 Essential (primary) hypertension: Secondary | ICD-10-CM | POA: Diagnosis not present

## 2021-07-26 DIAGNOSIS — F429 Obsessive-compulsive disorder, unspecified: Secondary | ICD-10-CM | POA: Diagnosis not present

## 2021-08-15 DIAGNOSIS — J331 Polypoid sinus degeneration: Secondary | ICD-10-CM | POA: Diagnosis not present

## 2021-08-15 DIAGNOSIS — R053 Chronic cough: Secondary | ICD-10-CM | POA: Diagnosis not present

## 2021-08-15 DIAGNOSIS — F32A Depression, unspecified: Secondary | ICD-10-CM | POA: Diagnosis not present

## 2021-08-15 DIAGNOSIS — R490 Dysphonia: Secondary | ICD-10-CM | POA: Diagnosis not present

## 2021-08-15 DIAGNOSIS — Z9151 Personal history of suicidal behavior: Secondary | ICD-10-CM | POA: Diagnosis not present

## 2021-08-15 DIAGNOSIS — K219 Gastro-esophageal reflux disease without esophagitis: Secondary | ICD-10-CM | POA: Diagnosis not present

## 2021-08-23 DIAGNOSIS — Z20822 Contact with and (suspected) exposure to covid-19: Secondary | ICD-10-CM | POA: Diagnosis not present

## 2021-08-23 DIAGNOSIS — Z03818 Encounter for observation for suspected exposure to other biological agents ruled out: Secondary | ICD-10-CM | POA: Diagnosis not present

## 2021-09-04 DIAGNOSIS — J301 Allergic rhinitis due to pollen: Secondary | ICD-10-CM | POA: Diagnosis not present

## 2021-10-09 DIAGNOSIS — J324 Chronic pansinusitis: Secondary | ICD-10-CM | POA: Diagnosis not present

## 2021-10-11 DIAGNOSIS — K2270 Barrett's esophagus without dysplasia: Secondary | ICD-10-CM | POA: Diagnosis not present

## 2021-10-11 DIAGNOSIS — R159 Full incontinence of feces: Secondary | ICD-10-CM | POA: Diagnosis not present

## 2021-10-11 DIAGNOSIS — Z6825 Body mass index (BMI) 25.0-25.9, adult: Secondary | ICD-10-CM | POA: Diagnosis not present

## 2021-10-11 DIAGNOSIS — F332 Major depressive disorder, recurrent severe without psychotic features: Secondary | ICD-10-CM | POA: Diagnosis not present

## 2021-10-17 ENCOUNTER — Encounter: Payer: Self-pay | Admitting: Family Medicine

## 2021-10-17 ENCOUNTER — Other Ambulatory Visit: Payer: Self-pay | Admitting: Family Medicine

## 2021-10-17 ENCOUNTER — Ambulatory Visit (INDEPENDENT_AMBULATORY_CARE_PROVIDER_SITE_OTHER): Payer: Medicare Other | Admitting: Family Medicine

## 2021-10-17 ENCOUNTER — Telehealth: Payer: Self-pay

## 2021-10-17 DIAGNOSIS — U071 COVID-19: Secondary | ICD-10-CM | POA: Diagnosis not present

## 2021-10-17 DIAGNOSIS — R0982 Postnasal drip: Secondary | ICD-10-CM

## 2021-10-17 MED ORDER — NIRMATRELVIR/RITONAVIR (PAXLOVID)TABLET: 3.0000 | ORAL_TABLET | Freq: Two times a day (BID) | ORAL | 0 refills | Status: AC

## 2021-10-17 MED ORDER — BENZONATATE 200 MG PO CAPS
200.0000 mg | ORAL_CAPSULE | Freq: Two times a day (BID) | ORAL | 0 refills | Status: DC | PRN
Start: 2021-10-17 — End: 2021-11-13

## 2021-10-17 NOTE — Telephone Encounter (Signed)
Copied from [place] [id number]. Topic: General - Other ?>> Oct 17, 2021  1:34 PM Valere Dross wrote: ?Reason for CRM: Pt called in stating he received a vm from pcp nurse about his appt for today, pt states he was just seen at the urgent care, and did test positive for covid, and needs to know what to do next with his appt, please advise. ?

## 2021-10-17 NOTE — Progress Notes (Signed)
? ? ?Virtual telephone visit ? ? ? ?Virtual Visit via Telephone Note  ? ?This visit type was conducted due to national recommendations for restrictions regarding the COVID-19 Pandemic (e.g. social distancing) in an effort to limit this patient's exposure and mitigate transmission in our community. Due to his co-morbid illnesses, this patient is at least at moderate risk for complications without adequate follow up. This format is felt to be most appropriate for this patient at this time. The patient did not have access to video technology or had technical difficulties with video requiring transitioning to audio format only (telephone). Physical exam was limited to content and character of the telephone converstion.   ? ?Patient location: home ?Provider location: [facility] ?[id number] ?[place], [place] [address] ? ? ?I discussed the limitations of evaluation and management by telemedicine and the availability of in person appointments. The patient expressed understanding and agreed to proceed.  ? ?Visit Date: 10/17/2021 ? ?Today's healthcare provider: Gwyneth Sprout, FNP  ?Introduced to Designer, jewellery role and practice setting.  All questions answered.  Discussed provider/patient relationship and expectations. ? ? ?Positive COVID test on 10/15/21, confirmed 10/17/21. ? ?Subjective  ?  ?HPI  ? ?Encouraged to hold Viberzi and Viagra with use of Paxlovid. ? ?Medications: ?Outpatient Medications Prior to Visit  ?Medication Sig  ? Ascorbic Acid (VITAMIN C) 100 MG tablet Take 100 mg by mouth daily.  ? azithromycin (ZITHROMAX) 250 MG tablet Take 1 tablet (250 mg total) by mouth daily. Take first 2 tablets together, then 1 every day until finished. (Patient not taking: Reported on 07/26/2021)  ? COVID-19 At-Home Test KIT 1 kit by In Vitro route as needed. (Patient not taking: Reported on 07/26/2021)  ? diphenoxylate-atropine (LOMOTIL) 2.5-0.025 MG tablet Take 2 tablets by mouth 4 (four) times  daily as needed for diarrhea or loose stools.  ? Eluxadoline (VIBERZI) 100 MG TABS Take 1 tablet (100 mg total) by mouth 2 (two) times daily.  ? hyoscyamine (ANASPAZ) 0.125 MG TBDP disintergrating tablet Place 1 tablet (0.125 mg total) under the tongue every 4 (four) hours as needed. (Patient not taking: Reported on 07/26/2021)  ? hyoscyamine (LEVSIN SL) 0.125 MG SL tablet PLACE 1 TABLET UNDER THE TONGUE EVERY 4 HOURS AS NEEDED.  ? Multiple Vitamin (MULTIVITAMIN WITH MINERALS) TABS tablet Take 1 tablet by mouth daily.  ? omeprazole (PRILOSEC) 20 MG capsule Take 20 mg by mouth daily.  ? Phenylephrine-APAP-guaiFENesin (TYLENOL SINUS SEVERE) 5-325-200 MG TABS Take 1 tablet by mouth every 4 (four) hours as needed. (Patient not taking: Reported on 07/26/2021)  ? rosuvastatin (CRESTOR) 10 MG tablet TAKE 1 TABLET BY MOUTH DAILY  ? sildenafil (VIAGRA) 50 MG tablet Take 1 tablet (50 mg total) by mouth as needed for erectile dysfunction.  ? triamcinolone cream (KENALOG) 0.5 % Apply 1 application topically 2 (two) times daily. Apply to legs (Patient not taking: Reported on 07/26/2021)  ? verapamil (CALAN-SR) 240 MG CR tablet TAKE 1 TABLET BY MOUTH DAILY  ? ?No facility-administered medications prior to visit.  ? ? ?Review of Systems ? ? ? ? Objective  ?  ?There were no vitals taken for this visit. ? ?Patient denies fevers. ? ? ? Assessment & Plan  ?  ? ?Problem List Items Addressed This Visit   ? ?  ? Other  ? COVID - Primary  ?  Acute, stable ?Denies fevers ?Ongoing respiratory complaints; has followed by ENT for the last 2 months ?Test done at  diagnostic center on 10/15/21, confirmed positive on 10/17/21 ?Will use Paxlovid to assist; has GFR from <6 months ago, stable at 87 ? ?  ?  ? PND (post-nasal drip)  ?  Acute on chronic, associated with COVID infection ?Request for tessalon to assist with bronchospasm from PND ? ? ?  ?  ? ? ? ?Return if symptoms worsen or fail to improve. ?  ? ?I discussed the assessment and treatment plan  with the patient. The patient was provided an opportunity to ask questions and all were answered. The patient agreed with the plan and demonstrated an understanding of the instructions. ?  ?The patient was advised to call back or seek an in-person evaluation if the symptoms worsen or if the condition fails to improve as anticipated. ? ?I provided 12 minutes of non-face-to-face time during this encounter. ? ?I, Gwyneth Sprout, FNP, have reviewed all documentation for this visit. The documentation on 10/17/21 for the exam, diagnosis, procedures, and orders are all accurate and complete. ? ? ?Gwyneth Sprout, FNP ?[place] ?[phone] (phone) ?[phone] (fax) ? ?[facility]   ?

## 2021-10-17 NOTE — Assessment & Plan Note (Signed)
Acute, stable ?Denies fevers ?Ongoing respiratory complaints; has followed by ENT for the last 2 months ?Test done at diagnostic center on 10/15/21, confirmed positive on 10/17/21 ?Will use Paxlovid to assist; has GFR from <6 months ago, stable at 87 ?

## 2021-10-17 NOTE — Assessment & Plan Note (Signed)
Acute on chronic, associated with COVID infection ?Request for tessalon to assist with bronchospasm from PND ? ?

## 2021-10-22 ENCOUNTER — Other Ambulatory Visit: Payer: Self-pay | Admitting: Family Medicine

## 2021-10-22 ENCOUNTER — Telehealth: Payer: Self-pay

## 2021-10-22 DIAGNOSIS — U071 COVID-19: Secondary | ICD-10-CM

## 2021-10-22 NOTE — Telephone Encounter (Signed)
Copied from [place] [phone]. Topic: Referral - Request for Referral ?>> Oct 22, 2021  9:10 AM Yvette Rack wrote: ?Has patient seen PCP for this complaint? No. ?*If NO, is insurance requiring patient see PCP for this issue before PCP can refer them? ?Referral for which specialty: Covid test ?Preferred provider/office: [facility] [place], [place]  Ph# [phone] or [phone] ?Reason for referral: Pt request test for Covid so insurance will cover cost ?

## 2021-10-22 NOTE — Telephone Encounter (Signed)
Please advise. KW 

## 2021-10-22 NOTE — Telephone Encounter (Signed)
Pt requested a call back, pt wants another Covid test, please advise.  ?

## 2021-10-22 NOTE — Telephone Encounter (Signed)
Lmtcb okay for [facility] nurse triage to advise as below. KW ?

## 2021-10-22 NOTE — Telephone Encounter (Signed)
Spoke with patient on the phone and advised him as below, patient states that he wants testing and will take his change rather insurance will cover it or not. Patient states that he finished last dose of Paxlovid this morning and states that he was assured by [facility] that PCR test are 100% accurate. Patient is requesting we place order and notify him once order has been placed. KW ?

## 2021-10-23 ENCOUNTER — Telehealth (INDEPENDENT_AMBULATORY_CARE_PROVIDER_SITE_OTHER): Payer: Self-pay

## 2021-10-23 NOTE — Telephone Encounter (Signed)
Copied from [place] [phone]. Topic: General - Other ?>> Oct 23, 2021 12:50 PM Yvette Rack wrote: ?Reason for CRM: Pt stated he will need to come in to the office to pick up a copy of the referral for a Covid test at [facility]. Pt asked that the referral be waiting for him at the front desk if possible. Pt requests call back to advise. Pt also asked that if he does not answer to please leave a message advising when referral will be ready for  pick up ?

## 2021-10-23 NOTE — Telephone Encounter (Signed)
Left message for patient letting him know that this was available to pick up at front desk. KW ?

## 2021-10-24 DIAGNOSIS — Z20822 Contact with and (suspected) exposure to covid-19: Secondary | ICD-10-CM | POA: Diagnosis not present

## 2021-10-24 DIAGNOSIS — Z03818 Encounter for observation for suspected exposure to other biological agents ruled out: Secondary | ICD-10-CM | POA: Diagnosis not present

## 2021-11-08 DIAGNOSIS — J310 Chronic rhinitis: Secondary | ICD-10-CM | POA: Diagnosis not present

## 2021-11-08 DIAGNOSIS — R053 Chronic cough: Secondary | ICD-10-CM | POA: Diagnosis not present

## 2021-11-09 NOTE — Progress Notes (Signed)
Psychiatric Initial Adult Assessment   Patient Identification: William Coleman. MRN:  [id number] Date of Evaluation:  11/13/2021 Referral Source: [place]  Chief Complaint:   Chief Complaint  Patient presents with   Establish Care   Visit Diagnosis:    ICD-10-CM   1. MDD (major depressive disorder), recurrent, in full remission ([place])  F33.42     2. Obsessive-compulsive disorder, unspecified type  F42.9       History of Present Illness:   William Matranga. is a 61 y.o. year old male with a history of bipolar disorder by chart, OCD, hypertension, hyperlipidemia, hypothyroidism, who is transferred from [facility].   He states that although he used to go to [facility], they did not take Medicare anymore.  He reports history of seen by at least a few psychiatrists in the past, including the one in [place].  He has not taken any medication for the past few years, and denies any mood symptoms.  He is more concerned about "physiological symptoms" of rhinorrhea and diarrhea.  He tends to be frustrated with his symptoms.  He usually goes to the bathroom for an hour due to his bowel issues, and the below noses several times in the morning.  He does enjoy spending time with his girlfriend, and going to Bible study.  They are thinking of getting married in the future.   Depression-he denies any mood symptoms.  He sleeps well.  He denies SI.  He had tried many psychotropics as listed below, which ended up having GI side effect, or issues with hypertension.  He brought paperwork of rexulti with the hope to get into [facility] program.  He states that Vraylar used to work for rhinorrhea in the past.   OCD-he reports history of checking in the past.  However, his diarrhea got worse, and his concern was more of his physical symptoms.   Bipolar-although he reports history of bipolar disorder many years ago, he denies any history of decreased need for sleep, euphoria or increased goal  directed activity.   Physical symptoms-he has rhinorrhea and they are here as described above.  He denies any concern of having any serious diagnosis, referring to the evaluation in the past.   Substance-he rarely drinks alcohol.  He denies drug use.   Wt Readings from Last 3 Encounters:  11/13/21 164 lb 3.2 oz (74.5 kg)  07/26/21 167 lb 11.2 oz (76.1 kg)  04/25/21 171 lb 4.8 oz (77.7 kg)     Support:girlfriend Household: by himself Marital status: single, has a girlfriend of two years Number of children: 0  Employment: unemployed Education:  high school (two years of college) Last PCP / ongoing medical evaluation:   He was born 3 months premature. He was born in [place], and grew up in [place]. 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 Both of his parents are deceased.    Associated Signs/Symptoms: Depression Symptoms:   denies feeling depressed (Hypo) Manic Symptoms:   denies decreased need for sleep, euphoria Anxiety Symptoms:   denies anxiety Psychotic Symptoms:   denies AH, VH, paranoia PTSD Symptoms: Negative  Past Psychiatric History:  Outpatient: a few psychiatrists, including Dr. Nicolasa Ducking and [facility] Psychiatry admission: in 2017, 2018, 2020 for SI (never attempted) Previous suicide attempt: denies Past trials of medication: sertraline, fluoxetine, lexapro, fluoxetine, Paxil, duloxetine, venlafaxine, clomipramine (constipation), bupropion, Trintellix, Abilify, vraylar (hypertension)  History of violence:    Previous Psychotropic Medications: Yes   Substance  Abuse History in the last 12 months:  No.  Consequences of Substance Abuse: NA  Past Medical History:  Past Medical History:  Diagnosis Date   Anancastic neurosis    Anginal pain ([place])    Anxiety, generalized    Bipolar disorder ([place])    Chronic diarrhea    Depression    Hypertension    Hypothyroidism    Obsessive compulsive disorder    OCD (obsessive compulsive disorder)    Sleep  apnea    patient states it was "marginal", no CPAP    Past Surgical History:  Procedure Laterality Date   COLONOSCOPY WITH PROPOFOL N/A 02/05/2016   Procedure: COLONOSCOPY WITH PROPOFOL;  Surgeon: [name], MD;  Location: [facility];  Service: Endoscopy;  Laterality: N/A;   MOUTH SURGERY     orthodontic surgery for underbite   MYRINGOTOMY WITH TUBE PLACEMENT     NASAL SEPTUM SURGERY     NASAL SINUS SURGERY Bilateral 10/09/2020   POLYPECTOMY N/A 02/05/2016   Procedure: POLYPECTOMY;  Surgeon: [name], MD;  Location: [facility];  Service: Endoscopy;  Laterality: N/A;    Family Psychiatric History: as below  Family History:  Family History  Problem Relation Age of Onset   Depression Mother    Anxiety disorder Mother    Congestive Heart Failure Father    Prostate cancer Father    Hypertension Father     Social History:   Social History   Socioeconomic History   Marital status: Single    Spouse name: Not on file   Number of children: 0   Years of education: Not on file   Highest education level: Some college, no degree  Occupational History   Not on file  Tobacco Use   Smoking status: Never   Smokeless tobacco: Never  Vaping Use   Vaping Use: Never used  Substance and Sexual Activity   Alcohol use: Not Currently    Comment: rarely   Drug use: No   Sexual activity: Never    Birth control/protection: Abstinence  Other Topics Concern   Not on file  Social History Narrative   Not on file   Social Determinants of Health   Financial Resource Strain: Not on file  Food Insecurity: Not on file  Transportation Needs: Not on file  Physical Activity: Not on file  Stress: Not on file  Social Connections: Not on file    Additional Social History: as above  Allergies:   Allergies  Allergen Reactions   Penicillins Hives and Rash   Sulfa Antibiotics Hives   Amoxicillin Hives    Metabolic Disorder Labs: Lab Results  Component Value Date    HGBA1C 5.1 11/14/2015   Lab Results  Component Value Date   PROLACTIN 6.8 11/14/2015   Lab Results  Component Value Date   CHOL 138 04/25/2021   TRIG 121 04/25/2021   HDL 48 04/25/2021   CHOLHDL 2.9 04/25/2021   VLDL 49 (H) 11/14/2015   LDLCALC 68 04/25/2021   LDLCALC 75 06/11/2018   Lab Results  Component Value Date   TSH 1.160 08/05/2019    Therapeutic Level Labs: No results found for: LITHIUM No results found for: CBMZ No results found for: VALPROATE  Current Medications: Current Outpatient Medications  Medication Sig Dispense Refill   Ascorbic Acid (VITAMIN C) 100 MG tablet Take 100 mg by mouth daily.     diphenoxylate-atropine (LOMOTIL) 2.5-0.025 MG tablet Take 2 tablets by mouth 4 (four) times daily as needed for diarrhea  or loose stools. 240 tablet 5   Eluxadoline (VIBERZI) 100 MG TABS Take 1 tablet (100 mg total) by mouth 2 (two) times daily. 60 tablet 5   hyoscyamine (ANASPAZ) 0.125 MG TBDP disintergrating tablet Place 1 tablet (0.125 mg total) under the tongue every 4 (four) hours as needed. 180 tablet 1   Multiple Vitamin (MULTIVITAMIN WITH MINERALS) TABS tablet Take 1 tablet by mouth daily.     rosuvastatin (CRESTOR) 10 MG tablet TAKE 1 TABLET BY MOUTH DAILY 90 tablet 3   sildenafil (VIAGRA) 50 MG tablet Take 1 tablet (50 mg total) by mouth as needed for erectile dysfunction. 25 tablet 4   verapamil (CALAN-SR) 240 MG CR tablet TAKE 1 TABLET BY MOUTH DAILY 90 tablet 3   No current facility-administered medications for this visit.    Musculoskeletal: Strength & Muscle Tone: within normal limits Gait & Station: normal Patient leans: N/A  Psychiatric Specialty Exam: Review of Systems  Psychiatric/Behavioral: Negative.    All other systems reviewed and are negative.  Blood pressure (!) 159/90, pulse 78, temperature 98.7 F (37.1 C), temperature source Temporal, weight 164 lb 3.2 oz (74.5 kg).Body mass index is 25.72 kg/m.  General Appearance: Fairly Groomed   Eye Contact:  Good  Speech:  Clear and Coherent  Volume:  Normal  Mood:   good  Affect:  Appropriate, Congruent, and calm  Thought Process:  Coherent  Orientation:  Full (Time, Place, and Person)  Thought Content:  Logical  Suicidal Thoughts:  No  Homicidal Thoughts:  No  Memory:  Immediate;   Good  Judgement:  Good  Insight:  Good  Psychomotor Activity:  Normal  Concentration:  Concentration: Good and Attention Span: Good  Recall:  Good  Fund of Knowledge:Good  Language: Good  Akathisia:  No  Handed:  Right  AIMS (if indicated):  not done  Assets:  Communication Skills Desire for Improvement  ADL's:  Intact  Cognition: WNL  Sleep:  Good   Screenings: AIMS    Flowsheet Row Admission (Discharged) from 11/18/2018 in [facility] Admission (Discharged) from 12/31/2016 in [facility] Admission (Discharged) from 11/10/2015 in [facility] Total Score 0 0 0      AUDIT    Flowsheet Row Admission (Discharged) from 11/18/2018 in [facility] Admission (Discharged) from 12/31/2016 in [facility] Admission (Discharged) from 11/10/2015 in [facility]  Alcohol Use Disorder Identification Test Final Score (AUDIT) 0 1 0      PHQ2-9    Flowsheet Row Office Visit from 11/13/2021 in [facility] Office Visit from 07/26/2021 in [facility] Visit from 04/25/2021 in [facility] Visit from 03/13/2020 in [facility] Visit from 07/02/2017 in [facility]  PHQ-2 Total Score 0 0 0 0 3  PHQ-9 Total Score -- 0 0 0 9      [facility] [place] ED from 06/14/2021 in [facility]  Admission (Discharged) from 11/18/2018 in [facility] ED from 11/17/2018 in [facility] CATEGORY No Risk No Risk High Risk       Assessment and Plan:  Mallory Enriques. is a 61 y.o. year old male with a history of bipolar disorder by chart, OCD, hypertension, hyperlipidemia, hypothyroidism, who is transferred from [facility].   1. MDD (major depressive disorder), recurrent,  in full remission ([place]) 2. Obsessive-compulsive disorder, unspecified type He denies any significant mood symptoms over the past few years.  He does have physical symptoms of rhinorrhea year, chronic diarrhea, and reports adverse reaction from several psychotropics.  He agrees to hold starting any psychotropics at this time given he does not have any significant mood symptoms at this time.  May consider quetiapine or mirtazapine in the future given he does not recall he tried this medication.  Noted that although he did try [place] in the past, it has limited benefit.  Will continue to monitor this.  Noted that although he reports history of bipolar disorder many years ago, he denies any manic disorder in the past.  There is no known episode of mania in discharge summary either.  His clinical course is more consistent with depression.   Plan No psychotropic is prescribed at this time.  Next appointment: 7/13 at 3 PM for 30 mins, in person   The patient demonstrates the following risk factors for suicide: Chronic risk factors for suicide include: psychiatric disorder of depression . Acute risk factors for suicide include: unemployment. Protective factors for this patient include: hope for the future. Considering these factors, the overall suicide risk at this point appears to be low. Patient is appropriate for outpatient follow up.       Collaboration of Care: Other reviewing record from [facility], discharge summary  Patient/Guardian was advised Release of Information must be obtained prior to any record release in order to collaborate their care with an outside provider. Patient/Guardian was  advised if they have not already done so to contact the registration department to sign all necessary forms in order for [place] to release information regarding their care.   Consent: Patient/Guardian gives verbal consent for treatment and assignment of benefits for services provided during this visit. Patient/Guardian expressed understanding and agreed to proceed.   Norman Clay, MD 6/6/20234:02 PM

## 2021-11-13 ENCOUNTER — Ambulatory Visit (INDEPENDENT_AMBULATORY_CARE_PROVIDER_SITE_OTHER): Payer: Medicare Other | Admitting: Psychiatry

## 2021-11-13 ENCOUNTER — Encounter: Payer: Self-pay | Admitting: Psychiatry

## 2021-11-13 VITALS — BP 159/90 | HR 78 | Temp 98.7°F | Wt 164.2 lb

## 2021-11-13 DIAGNOSIS — F3342 Major depressive disorder, recurrent, in full remission: Secondary | ICD-10-CM

## 2021-11-13 DIAGNOSIS — F429 Obsessive-compulsive disorder, unspecified: Secondary | ICD-10-CM | POA: Diagnosis not present

## 2021-12-03 DIAGNOSIS — E7800 Pure hypercholesterolemia, unspecified: Secondary | ICD-10-CM | POA: Diagnosis not present

## 2021-12-03 DIAGNOSIS — K2930 Chronic superficial gastritis without bleeding: Secondary | ICD-10-CM | POA: Diagnosis not present

## 2021-12-03 DIAGNOSIS — Z882 Allergy status to sulfonamides status: Secondary | ICD-10-CM | POA: Diagnosis not present

## 2021-12-03 DIAGNOSIS — K2270 Barrett's esophagus without dysplasia: Secondary | ICD-10-CM | POA: Diagnosis not present

## 2021-12-03 DIAGNOSIS — K449 Diaphragmatic hernia without obstruction or gangrene: Secondary | ICD-10-CM | POA: Diagnosis not present

## 2021-12-03 DIAGNOSIS — K219 Gastro-esophageal reflux disease without esophagitis: Secondary | ICD-10-CM | POA: Diagnosis not present

## 2021-12-03 DIAGNOSIS — I10 Essential (primary) hypertension: Secondary | ICD-10-CM | POA: Diagnosis not present

## 2021-12-03 DIAGNOSIS — G709 Myoneural disorder, unspecified: Secondary | ICD-10-CM | POA: Diagnosis not present

## 2021-12-03 DIAGNOSIS — E039 Hypothyroidism, unspecified: Secondary | ICD-10-CM | POA: Diagnosis not present

## 2021-12-03 DIAGNOSIS — Z880 Allergy status to penicillin: Secondary | ICD-10-CM | POA: Diagnosis not present

## 2021-12-03 LAB — HM COLONOSCOPY

## 2021-12-13 ENCOUNTER — Ambulatory Visit (INDEPENDENT_AMBULATORY_CARE_PROVIDER_SITE_OTHER): Payer: Medicare Other

## 2021-12-13 VITALS — BP 142/90 | Ht 67.0 in | Wt 165.2 lb

## 2021-12-13 DIAGNOSIS — Z0000 Encounter for general adult medical examination without abnormal findings: Secondary | ICD-10-CM

## 2021-12-13 NOTE — Progress Notes (Signed)
Subjective:   William Aguas Leonides Minder. is a 61 y.o. male who presents for an Initial Medicare Annual Wellness Visit.  Review of Systems     Cardiac Risk Factors include: advanced age (>55mn, >>16women);male gender;hypertension     Objective:    There were no vitals filed for this visit. There is no height or weight on file to calculate BMI.     12/13/2021    2:53 PM 11/18/2018    6:54 PM 11/17/2018    9:34 PM 05/26/2017    3:09 PM 12/31/2016    4:41 PM 12/30/2016    7:32 PM 02/05/2016    7:28 AM  Advanced Directives  Does Patient Have a Medical Advance Directive? No  No No  No No  Would patient like information on creating a medical advance directive? No - Patient declined  No - Patient declined    No - patient declined information     Information is confidential and restricted. Go to Review Flowsheets to unlock data.    Current Medications (verified) Outpatient Encounter Medications as of 12/13/2021  Medication Sig   Ascorbic Acid (VITAMIN C) 100 MG tablet Take 100 mg by mouth daily.   B Complex Vitamins (VITAMIN B COMPLEX) TABS    cetirizine-pseudoephedrine (ZYRTEC-D ALLERGY & CONGESTION) 5-120 MG tablet    Cholecalciferol (VITAMIN D3) 10 MCG (400 UNIT) CAPS    diphenoxylate-atropine (LOMOTIL) 2.5-0.025 MG tablet Take 2 tablets by mouth 4 (four) times daily as needed for diarrhea or loose stools.   Eluxadoline (VIBERZI) 100 MG TABS Take 1 tablet (100 mg total) by mouth 2 (two) times daily.   hyoscyamine (ANASPAZ) 0.125 MG TBDP disintergrating tablet Place 1 tablet (0.125 mg total) under the tongue every 4 (four) hours as needed.   Multiple Vitamin (MULTIVITAMIN WITH MINERALS) TABS tablet Take 1 tablet by mouth daily.   rosuvastatin (CRESTOR) 10 MG tablet TAKE 1 TABLET BY MOUTH DAILY   sildenafil (VIAGRA) 50 MG tablet Take 1 tablet (50 mg total) by mouth as needed for erectile dysfunction.   verapamil (CALAN-SR) 240 MG CR tablet TAKE 1 TABLET BY MOUTH DAILY   No  facility-administered encounter medications on file as of 12/13/2021.    Allergies (verified) Penicillins, Sulfa antibiotics, and Amoxicillin   History: Past Medical History:  Diagnosis Date   Anancastic neurosis    Anginal pain (H[facility])    Anxiety, generalized    Bipolar disorder (H[facility])    Chronic diarrhea    Depression    Hypertension    Hypothyroidism    Obsessive compulsive disorder    OCD (obsessive compulsive disorder)    Sleep apnea    patient states it was "marginal", no CPAP   Past Surgical History:  Procedure Laterality Date   COLONOSCOPY WITH PROPOFOL N/A 02/05/2016   Procedure: COLONOSCOPY WITH PROPOFOL;  Surgeon: DLucilla Lame MD;  [place] [facility]  Service: Endoscopy;  Laterality: N/A;   MOUTH SURGERY     orthodontic surgery for underbite   MYRINGOTOMY WITH TUBE PLACEMENT     NASAL SEPTUM SURGERY     NASAL SINUS SURGERY Bilateral 10/09/2020   POLYPECTOMY N/A 02/05/2016   Procedure: POLYPECTOMY;  Surgeon: DLucilla Lame MD;  Location[facility]  Service: Endoscopy;  Laterality: N/A;   Family History  Problem Relation Age of Onset   Depression Mother    Anxiety disorder Mother    Congestive Heart Failure Father    Prostate cancer Father    Hypertension Father  Social History   Socioeconomic History   Marital status: Single    Spouse name: Not on file   Number of children: 0   Years of education: Not on file   Highest education level: Some college, no degree  Occupational History   Not on file  Tobacco Use   Smoking status: Never   Smokeless tobacco: Never  Vaping Use   Vaping Use: Never used  Substance and Sexual Activity   Alcohol use: Not Currently    Comment: rarely   Drug use: No   Sexual activity: Never    Birth control/protection: Abstinence  Other Topics Concern   Not on file  Social History Narrative   Not on file   Social Determinants of Health   Financial Resource Strain: Low Risk  (12/13/2021)   Overall  Financial Resource Strain (CARDIA)    Difficulty of Paying Living Expenses: Not very hard  Food Insecurity: No Food Insecurity (12/13/2021)   Hunger Vital Sign    Worried About Running Out of Food in the Last Year: Never true    Ran Out of Food in the Last Year: Never true  Transportation Needs: No Transportation Needs (12/13/2021)   PRAPARE - Hydrologist (Medical): No    Lack of Transportation (Non-Medical): No  Physical Activity: Insufficiently Active (12/13/2021)   Exercise Vital Sign    Days of Exercise per Week: 2 days    Minutes of Exercise per Session: 30 min  Stress: No Stress Concern Present (12/13/2021)   [place]    Feeling of Stress : Not at all  Social Connections: Moderately Isolated (12/13/2021)   Social Connection and Isolation Panel [NHANES]    Frequency of Communication with Friends and Family: More than three times a week    Frequency of Social Gatherings with Friends and Family: Twice a week    Attends Religious Services: More than 4 times per year    Active Member of [employer] or Organizations: No    Attends Music therapist: Never    Marital Status: Never married    Tobacco Counseling Counseling given: Not Answered   Clinical Intake:  Pre-visit preparation completed: Yes  Pain : No/denies pain     Nutritional Risks: None Diabetes: No  How often do you need to have someone help you when you read instructions, pamphlets, or other written materials from your doctor or pharmacy?: 1 - Never  Diabetic?no  Interpreter Needed?: No  Information entered by :: William Shaggy, LPN   Activities of Daily Living    12/13/2021    2:54 PM 12/07/2021    8:52 PM  In your present state of health, do you have any difficulty performing the following activities:  Hearing? 1 0  Vision? 0 0  Difficulty concentrating or making decisions? 0 0  Walking or climbing stairs?  0 0  Dressing or bathing? 0 0  Doing errands, shopping? 0 0  Preparing Food and eating ? N N  Using the Toilet? N N  In the past six months, have you accidently leaked urine? N N  Do you have problems with loss of bowel control? N N  Managing your Medications? N N  Managing your Finances? N N  Housekeeping or managing your Housekeeping? N N    Patient Care Team: Jerrol Banana., MD as PCP - General (Family Medicine)  Indicate any recent Medical Services you may have received from other  than [facility] providers in the past year (date may be approximate).     Assessment:   This is a routine wellness examination for [place].  Hearing/Vision screen Hearing Screening - Comments:: No aids  Vision Screening - Comments:: Reading glasses-   Dietary issues and exercise activities discussed: Current Exercise Habits: Home exercise routine, Type of exercise: walking, Time (Minutes): 30, Frequency (Times/Week): 3, Weekly Exercise (Minutes/Week): 90, Intensity: Mild   Goals Addressed             This Visit's Progress    DIET - EAT MORE FRUITS AND VEGETABLES         Depression Screen    12/13/2021    2:49 PM 11/13/2021    3:54 PM 07/26/2021    2:30 PM 04/25/2021    1:47 PM 03/13/2020    3:38 PM 07/02/2017    9:53 AM 01/16/2017    3:54 PM  PHQ 2/9 Scores  PHQ - 2 Score 0  0 0 0 3 6  PHQ- 9 Score 0  0 0 0 9 12     Information is confidential and restricted. Go to Review Flowsheets to unlock data.    Fall Risk    12/13/2021    2:54 PM 12/07/2021    8:52 PM 07/26/2021    2:30 PM 04/25/2021    1:47 PM 03/13/2020    3:39 PM  [place] in the past year? 0 0 0 0   Number falls in past yr: 0  0 0 0  Injury with Fall? 0  0 0 0  Risk for fall due to : No Fall Risks  No Fall Risks No Fall Risks No Fall Risks  Follow up   Falls evaluation completed  Falls evaluation completed    FALL RISK PREVENTION PERTAINING TO THE HOME:  Any stairs in or around the home? No  If so, are  there any without handrails? No  Home free of loose throw rugs in walkways, pet beds, electrical cords, etc? Yes  Adequate lighting in your home to reduce risk of falls? Yes   ASSISTIVE DEVICES UTILIZED TO PREVENT FALLS:  Life alert? No  Use of a cane, walker or w/c? Yes  Grab bars in the bathroom? No  Shower chair or bench in shower? No  Elevated toilet seat or a handicapped toilet? No   TIMED UP AND GO:  Was the test performed? Yes .  Length of time to ambulate 10 feet: 4 sec.   Gait steady and fast without use of assistive device  Cognitive Function:        12/13/2021    3:00 PM  6CIT Screen  What Year? 0 points  What month? 0 points  What time? 0 points  Count back from 20 0 points  Months in reverse 0 points  Repeat phrase 0 points  Total Score 0 points    Immunizations Immunization History  Administered Date(s) Administered   Hep A / Hep B 07/24/2007, 08/21/2007, 02/12/2008   Hepatitis A 07/24/2007, 08/21/2007, 02/12/2008   Hepatitis A, Adult 07/24/2007, 08/21/2007, 02/12/2008   Hepatitis B 07/24/2007, 07/24/2007, 08/21/2007, 08/21/2007, 02/12/2008, 02/12/2008   Influenza Split 03/23/2010, 02/26/2016   Influenza,inj,Quad PF,6+ Mos 03/07/2014, 03/10/2017, 02/22/2019, 03/13/2020   Influenza-Unspecified 05/13/1997, 05/11/2018, 02/23/2019, 03/13/2020, 03/14/2021   PFIZER(Purple Top)SARS-COV-2 Vaccination 11/30/2020   Td 07/02/2017, 07/02/2017   Tdap 07/24/2007    TDAP status: Up to date  Flu Vaccine status: Up to date  Pneumococcal vaccine status: Declined,  Education has been provided regarding the importance of this vaccine but patient still declined. Advised may receive this vaccine at local pharmacy or Health Dept. Aware to provide a copy of the vaccination record if obtained from local pharmacy or Health Dept. Verbalized acceptance and understanding.   Covid-19 vaccine status: Completed vaccines  Qualifies for Shingles Vaccine? Yes   Zostavax completed  No   Shingrix Completed?: No.    Education has been provided regarding the importance of this vaccine. Patient has been advised to call insurance company to determine out of pocket expense if they have not yet received this vaccine. Advised may also receive vaccine at local pharmacy or Health Dept. Verbalized acceptance and understanding.  Screening Tests Health Maintenance  Topic Date Due   Zoster Vaccines- Shingrix (1 of 2) Never done   COVID-19 Vaccine (2 - Pfizer risk series) 12/21/2020   INFLUENZA VACCINE  01/08/2022   COLONOSCOPY (Pts 45-61yr Insurance coverage will need to be confirmed)  02/04/2026   TETANUS/TDAP  07/03/2027   Hepatitis C Screening  Completed   HIV Screening  Completed   HPV VACCINES  Aged Out    Health Maintenance  Health Maintenance Due  Topic Date Due   Zoster Vaccines- Shingrix (1 of 2) Never done   COVID-19 Vaccine (2 - Pfizer risk series) 12/21/2020    Colorectal cancer screening: Type of screening: Colonoscopy. Completed 02/05/16. Repeat every 10 years  Lung Cancer Screening: (Low Dose CT Chest recommended if Age 60-80years, 30 pack-year currently smoking OR have quit w/in 15years.) does not qualify.   Additional Screening:  Hepatitis C Screening: does not qualify; Completed 04/25/21  Vision Screening: Recommended annual ophthalmology exams for early detection of glaucoma and other disorders of the eye. Is the patient up to date with their annual eye exam?  No  Who is the provider or what is the name of the office in which the patient attends annual eye exams? no If pt is not established with a provider, would they like to be referred to a provider to establish care? No .   Dental Screening: Recommended annual dental exams for proper oral hygiene  Community Resource Referral / Chronic Care Management: CRR required this visit?  No   CCM required this visit?  No      Plan:     I have personally reviewed and noted the following in the  patient's chart:   Medical and social history Use of alcohol, tobacco or illicit drugs  Current medications and supplements including opioid prescriptions. Patient is not currently taking opioid prescriptions. Functional ability and status Nutritional status Physical activity Advanced directives List of other physicians Hospitalizations, surgeries, and ER visits in previous 12 months Vitals Screenings to include cognitive, depression, and falls Referrals and appointments  In addition, I have reviewed and discussed with patient certain preventive protocols, quality metrics, and best practice recommendations. A written personalized care plan for preventive services as well as general preventive health recommendations were provided to patient.     LDionisio David LPN   [id number]  Nurse Notes: none

## 2021-12-13 NOTE — Patient Instructions (Signed)
Mr. William Coleman , Thank you for taking time to come for your Medicare Wellness Visit. I appreciate your ongoing commitment to your health goals. Please review the following plan we discussed and let me know if I can assist you in the future.   Screening recommendations/referrals: Colonoscopy: 02/05/16 Recommended yearly ophthalmology/optometry visit for glaucoma screening and checkup Recommended yearly dental visit for hygiene and checkup  Vaccinations: Influenza vaccine: 03/14/21 Pneumococcal vaccine: n/d Tdap vaccine: 07/02/17 Shingles vaccine: n/d   Covid-19: 11/30/20, [facility] 4 other shots [facility]  Advanced directives: no  Conditions/risks identified: none  Next appointment: Follow up in one year for your annual wellness visit 12/17/22 @ 1:45pm in person  Preventive Care 40-64 Years, Male Preventive care refers to lifestyle choices and visits with your health care provider that can promote health and wellness. What does preventive care include? A yearly physical exam. This is also called an annual well check. Dental exams once or twice a year. Routine eye exams. Ask your health care provider how often you should have your eyes checked. Personal lifestyle choices, including: Daily care of your teeth and gums. Regular physical activity. Eating a healthy diet. Avoiding tobacco and drug use. Limiting alcohol use. Practicing safe sex. Taking low-dose aspirin every day starting at age 47. What happens during an annual well check? The services and screenings done by your health care provider during your annual well check will depend on your age, overall health, lifestyle risk factors, and family history of disease. Counseling  Your health care provider may ask you questions about your: Alcohol use. Tobacco use. Drug use. Emotional well-being. Home and relationship well-being. Sexual activity. Eating habits. Work and work Statistician. Screening  You may have the following tests or  measurements: Height, weight, and BMI. Blood pressure. Lipid and cholesterol levels. These may be checked every 5 years, or more frequently if you are over 43 years old. Skin check. Lung cancer screening. You may have this screening every year starting at age 28 if you have a 30-pack-year history of smoking and currently smoke or have quit within the past 15 years. Fecal occult blood test (FOBT) of the stool. You may have this test every year starting at age 82. Flexible sigmoidoscopy or colonoscopy. You may have a sigmoidoscopy every 5 years or a colonoscopy every 10 years starting at age 90. Prostate cancer screening. Recommendations will vary depending on your family history and other risks. Hepatitis C blood test. Hepatitis B blood test. Sexually transmitted disease (STD) testing. Diabetes screening. This is done by checking your blood sugar (glucose) after you have not eaten for a while (fasting). You may have this done every 1-3 years. Discuss your test results, treatment options, and if necessary, the need for more tests with your health care provider. Vaccines  Your health care provider may recommend certain vaccines, such as: Influenza vaccine. This is recommended every year. Tetanus, diphtheria, and acellular pertussis (Tdap, Td) vaccine. You may need a Td booster every 10 years. Zoster vaccine. You may need this after age 13. Pneumococcal 13-valent conjugate (PCV13) vaccine. You may need this if you have certain conditions and have not been vaccinated. Pneumococcal polysaccharide (PPSV23) vaccine. You may need one or two doses if you smoke cigarettes or if you have certain conditions. Talk to your health care provider about which screenings and vaccines you need and how often you need them. This information is not intended to replace advice given to you by your health care provider. Make sure you discuss  any questions you have with your health care provider. Document Released:  06/23/2015 Document Revised: 02/14/2016 Document Reviewed: 03/28/2015 Elsevier Interactive Patient Education  2017 [place] Prevention in the Home Falls can cause injuries. They can happen to people of all ages. There are many things you can do to make your home safe and to help prevent falls. What can I do on the outside of my home? Regularly fix the edges of walkways and driveways and fix any cracks. Remove anything that might make you trip as you walk through a door, such as a raised step or threshold. Trim any bushes or trees on the path to your home. Use bright outdoor lighting. Clear any walking paths of anything that might make someone trip, such as rocks or tools. Regularly check to see if handrails are loose or broken. Make sure that both sides of any steps have handrails. Any raised decks and porches should have guardrails on the edges. Have any leaves, snow, or ice cleared regularly. Use sand or salt on walking paths during winter. Clean up any spills in your garage right away. This includes oil or grease spills. What can I do in the bathroom? Use night lights. Install grab bars by the toilet and in the tub and shower. Do not use towel bars as grab bars. Use non-skid mats or decals in the tub or shower. If you need to sit down in the shower, use a plastic, non-slip stool. Keep the floor dry. Clean up any water that spills on the floor as soon as it happens. Remove soap buildup in the tub or shower regularly. Attach bath mats securely with double-sided non-slip rug tape. Do not have throw rugs and other things on the floor that can make you trip. What can I do in the bedroom? Use night lights. Make sure that you have a light by your bed that is easy to reach. Do not use any sheets or blankets that are too big for your bed. They should not hang down onto the floor. Have a firm chair that has side arms. You can use this for support while you get dressed. Do not have  throw rugs and other things on the floor that can make you trip. What can I do in the kitchen? Clean up any spills right away. Avoid walking on wet floors. Keep items that you use a lot in easy-to-reach places. If you need to reach something above you, use a strong step stool that has a grab bar. Keep electrical cords out of the way. Do not use floor polish or wax that makes floors slippery. If you must use wax, use non-skid floor wax. Do not have throw rugs and other things on the floor that can make you trip. What can I do with my stairs? Do not leave any items on the stairs. Make sure that there are handrails on both sides of the stairs and use them. Fix handrails that are broken or loose. Make sure that handrails are as long as the stairways. Check any carpeting to make sure that it is firmly attached to the stairs. Fix any carpet that is loose or worn. Avoid having throw rugs at the top or bottom of the stairs. If you do have throw rugs, attach them to the floor with carpet tape. Make sure that you have a light switch at the top of the stairs and the bottom of the stairs. If you do not have them, ask someone to add  them for you. What else can I do to help prevent falls? Wear shoes that: Do not have high heels. Have rubber bottoms. Are comfortable and fit you well. Are closed at the toe. Do not wear sandals. If you use a stepladder: Make sure that it is fully opened. Do not climb a closed stepladder. Make sure that both sides of the stepladder are locked into place. Ask someone to hold it for you, if possible. Clearly mark and make sure that you can see: Any grab bars or handrails. First and last steps. Where the edge of each step is. Use tools that help you move around (mobility aids) if they are needed. These include: Canes. Walkers. Scooters. Crutches. Turn on the lights when you go into a dark area. Replace any light bulbs as soon as they burn out. Set up your furniture so  you have a clear path. Avoid moving your furniture around. If any of your floors are uneven, fix them. If there are any pets around you, be aware of where they are. Review your medicines with your doctor. Some medicines can make you feel dizzy. This can increase your chance of falling. Ask your doctor what other things that you can do to help prevent falls. This information is not intended to replace advice given to you by your health care provider. Make sure you discuss any questions you have with your health care provider. Document Released: 03/23/2009 Document Revised: 11/02/2015 Document Reviewed: 07/01/2014 Elsevier Interactive Patient Education  2017 [employer].

## 2021-12-17 NOTE — Progress Notes (Unsigned)
BH MD/PA/NP OP Progress Note  12/20/2021 3:51 PM William Coleman.  MRN:  [id number]  Chief Complaint:  Chief Complaint  Patient presents with   Follow-up   HPI:  This is a follow-up appointment for OCD and depression.  He states that he underwent endoscopy; everything was fine.  He continues to have sinus issues and loose stool.  He feels frustrated with physical symptoms.  Although it is not worsening, it is not improving.  He states that although it is annoying, he denies any concern of having any serious illness.  He tends to spend several hours in the bathroom due to loose stool.  He blows his nose many times.  He denies any repetitive behaviors or counting otherwise.  He reports good relationship with his girlfriend.  She needs to have another knee surgery on the same knee.  They have been doing good otherwise.  He sleeps well.  He denies change in appetite.  He denies feeling depressed or anxiety.  He denies SI, HI, AH, VH.  He states that he wishes to start rexulti.  However, he understands that he does not necessarily feel depressed at this time.  He states that his mood was better when he was on Vraylar, which he was on until 2021. It caused hypertension. He also states that he was doing well on fluoxetine for OCD, and he tried it from 1992-2003.  He feels comfortable to stay off any medication at this time.   Wt Readings from Last 3 Encounters:  12/20/21 167 lb 3.2 oz (75.8 kg)  12/13/21 165 lb 3.2 oz (74.9 kg)  11/13/21 164 lb 3.2 oz (74.5 kg)    Support:girlfriend Household: by himself Marital status: single, has a girlfriend of two years Number of children: 0  Employment: unemployed Education:  high school (two years of college) Last PCP / ongoing medical evaluation:   He was born 3 months premature. He was born in [place], and grew up in [place]. 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 Both of his parents are deceased.   Visit Diagnosis:     ICD-10-CM   1. MDD (major depressive disorder), recurrent, in full remission ([place])  F33.42     2. Obsessive-compulsive disorder, unspecified type  F42.9       Past Psychiatric History: Please see initial evaluation for full details. I have reviewed the history. No updates at this time.     Past Medical History:  Past Medical History:  Diagnosis Date   Anancastic neurosis    Anginal pain ([place])    Anxiety, generalized    Bipolar disorder ([place])    Chronic diarrhea    Depression    Hypertension    Hypothyroidism    Obsessive compulsive disorder    OCD (obsessive compulsive disorder)    Sleep apnea    patient states it was "marginal", no CPAP    Past Surgical History:  Procedure Laterality Date   COLONOSCOPY WITH PROPOFOL N/A 02/05/2016   Procedure: COLONOSCOPY WITH PROPOFOL;  Surgeon: [name], MD;  Location: [facility];  Service: Endoscopy;  Laterality: N/A;   MOUTH SURGERY     orthodontic surgery for underbite   MYRINGOTOMY WITH TUBE PLACEMENT     NASAL SEPTUM SURGERY     NASAL SINUS SURGERY Bilateral 10/09/2020   POLYPECTOMY N/A 02/05/2016   Procedure: POLYPECTOMY;  Surgeon: [name], MD;  Location: [facility];  Service: Endoscopy;  Laterality: N/A;    Family  Psychiatric History: Please see initial evaluation for full details. I have reviewed the history. No updates at this time.     Family History:  Family History  Problem Relation Age of Onset   Depression Mother    Anxiety disorder Mother    Congestive Heart Failure Father    Prostate cancer Father    Hypertension Father     Social History:  Social History   Socioeconomic History   Marital status: Single    Spouse name: Not on file   Number of children: 0   Years of education: Not on file   Highest education level: Some college, no degree  Occupational History   Not on file  Tobacco Use   Smoking status: Never   Smokeless tobacco: Never  Vaping Use   Vaping Use: Never used   Substance and Sexual Activity   Alcohol use: Not Currently    Comment: rarely   Drug use: No   Sexual activity: Never    Birth control/protection: Abstinence  Other Topics Concern   Not on file  Social History Narrative   Not on file   Social Determinants of Health   Financial Resource Strain: Low Risk  (12/13/2021)   Overall Financial Resource Strain (CARDIA)    Difficulty of Paying Living Expenses: Not very hard  Food Insecurity: No Food Insecurity (12/13/2021)   Hunger Vital Sign    Worried About Running Out of Food in the Last Year: Never true    Ran Out of Food in the Last Year: Never true  Transportation Needs: No Transportation Needs (12/13/2021)   PRAPARE - Hydrologist (Medical): No    Lack of Transportation (Non-Medical): No  Physical Activity: Insufficiently Active (12/13/2021)   Exercise Vital Sign    Days of Exercise per Week: 2 days    Minutes of Exercise per Session: 30 min  Stress: No Stress Concern Present (12/13/2021)   [place]    Feeling of Stress : Not at all  Social Connections: Moderately Isolated (12/13/2021)   Social Connection and Isolation Panel [NHANES]    Frequency of Communication with Friends and Family: More than three times a week    Frequency of Social Gatherings with Friends and Family: Twice a week    Attends Religious Services: More than 4 times per year    Active Member of [employer] or Organizations: No    Attends Archivist Meetings: Never    Marital Status: Never married    Allergies:  Allergies  Allergen Reactions   Penicillins Hives and Rash   Sulfa Antibiotics Hives   Amoxicillin Hives    Metabolic Disorder Labs: Lab Results  Component Value Date   HGBA1C 5.1 11/14/2015   Lab Results  Component Value Date   PROLACTIN 6.8 11/14/2015   Lab Results  Component Value Date   CHOL 138 04/25/2021   TRIG 121 04/25/2021   HDL 48  04/25/2021   CHOLHDL 2.9 04/25/2021   VLDL 49 (H) 11/14/2015   LDLCALC 68 04/25/2021   LDLCALC 75 06/11/2018   Lab Results  Component Value Date   TSH 1.160 08/05/2019   TSH 0.478 11/20/2018    Therapeutic Level Labs: No results found for: "LITHIUM" No results found for: "VALPROATE" No results found for: "CBMZ"  Current Medications: Current Outpatient Medications  Medication Sig Dispense Refill   Ascorbic Acid (VITAMIN C) 100 MG tablet Take 100 mg by mouth daily.  B Complex Vitamins (VITAMIN B COMPLEX) TABS      cetirizine-pseudoephedrine (ZYRTEC-D ALLERGY & CONGESTION) 5-120 MG tablet      Cholecalciferol (VITAMIN D3) 10 MCG (400 UNIT) CAPS      diphenoxylate-atropine (LOMOTIL) 2.5-0.025 MG tablet Take 2 tablets by mouth 4 (four) times daily as needed for diarrhea or loose stools. 240 tablet 5   Eluxadoline (VIBERZI) 100 MG TABS Take 1 tablet (100 mg total) by mouth 2 (two) times daily. 60 tablet 5   hyoscyamine (ANASPAZ) 0.125 MG TBDP disintergrating tablet Place 1 tablet (0.125 mg total) under the tongue every 4 (four) hours as needed. 180 tablet 1   Multiple Vitamin (MULTIVITAMIN WITH MINERALS) TABS tablet Take 1 tablet by mouth daily.     rosuvastatin (CRESTOR) 10 MG tablet TAKE 1 TABLET BY MOUTH DAILY 90 tablet 3   sildenafil (VIAGRA) 50 MG tablet Take 1 tablet (50 mg total) by mouth as needed for erectile dysfunction. 25 tablet 4   verapamil (CALAN-SR) 240 MG CR tablet TAKE 1 TABLET BY MOUTH DAILY 90 tablet 3   No current facility-administered medications for this visit.     Musculoskeletal: Strength & Muscle Tone: within normal limits Gait & Station: normal Patient leans: N/A  Psychiatric Specialty Exam: Review of Systems  Psychiatric/Behavioral: Negative.    All other systems reviewed and are negative.   Blood pressure (!) 146/84, pulse 73, temperature 98.7 F (37.1 C), temperature source Temporal, weight 167 lb 3.2 oz (75.8 kg).Body mass index is 26.19  kg/m.  General Appearance: Fairly Groomed  Eye Contact:  Good  Speech:  Clear and Coherent  Volume:  Normal  Mood:   good  Affect:  Appropriate, Congruent, and Full Range  Thought Process:  Coherent  Orientation:  Full (Time, Place, and Person)  Thought Content: Logical   Suicidal Thoughts:  No  Homicidal Thoughts:  No  Memory:  Immediate;   Good  Judgement:  Good  Insight:  Good  Psychomotor Activity:  Normal  Concentration:  Concentration: Good and Attention Span: Good  Recall:  Good  Fund of Knowledge: Good  Language: Good  Akathisia:  No  Handed:  Right  AIMS (if indicated): not done  Assets:  Communication Skills Desire for Improvement  ADL's:  Intact  Cognition: WNL  Sleep:  Good   Screenings: AIMS    Flowsheet Row Admission (Discharged) from 11/18/2018 in [facility] Admission (Discharged) from 12/31/2016 in [facility] Admission (Discharged) from 11/10/2015 in [facility] Total Score 0 0 0      AUDIT    Flowsheet Row Admission (Discharged) from 11/18/2018 in [facility] Admission (Discharged) from 12/31/2016 in [facility] Admission (Discharged) from 11/10/2015 in [facility]  Alcohol Use Disorder Identification Test Final Score (AUDIT) 0 1 0      PHQ2-9    [facility] Office Visit from 12/20/2021 in [facility] from 12/13/2021 in [facility] Visit from 11/13/2021 in [facility] Visit from 07/26/2021 in [facility] Visit from 04/25/2021 in [facility]  PHQ-2 Total Score 0 0 0 0 0  PHQ-9 Total Score 0 0 -- 0 0      [facility] ED from 06/14/2021 in [facility]  Admission (Discharged) from 11/18/2018 in [facility]  [facility] ED from 11/17/2018 in [facility] EM[facility]GENCY [facility]PARTMENT  C-SSRS RISK CATEGORY No Risk No Risk High Risk        Assessment and Plan:  William Coleman. is a 61 y.o. year old male with a history of bipolar disorder by chart, OCD, hypertension, hyperlipidemia, hypothyroidism, who presents for follow up appointment for below.   1. MDD (major depressive disorder), recurrent, in full remission ([place]) 2. Obsessive-compulsive disorder, unspecified type # r/o somatic symptoms disorder He denies significant mood symptoms over the past few years without being on psychotropics at least for the past year.  He does have physical symptoms of rhinorrhea, chronic diarrhea, and reports stress due to these.  Although he reports that his mood was better when he was on psychotropics, he currently denies any significant mood symptoms to pursue treatment especially given his known adverse reaction from several antipsychotics.  He agrees to hold starting any medication at this time. Noted that although he did try [place] in the past, it has limited benefit. Also noted that although he reports history of bipolar disorder many years ago, he denies any manic disorder in the past.  There is no known episode of mania in discharge summary either.  His clinical course is more consistent with depression.   Plan No psychotropic is prescribed at this time.  Next appointment: 10/12 at 3 PM for 30 mins, in person- call for sooner appointment if any worsening in his mood symptoms     The patient demonstrates the following risk factors for suicide: Chronic risk factors for suicide include: psychiatric disorder of depression . Acute risk factors for suicide include: unemployment. Protective factors for this patient include: hope for the future. Considering these factors, the overall suicide risk at this point appears to be low. Patient is appropriate for outpatient follow up.     Past trials of medication:  sertraline, fluoxetine, lexapro, fluoxetine, Paxil, duloxetine, venlafaxine, clomipramine (constipation), bupropion, Trintellix, Abilify, Vraylar (1.5 mg caused hypertension)    Collaboration of Care: Collaboration of Care: Other N/A  Patient/Guardian was advised Release of Information must be obtained prior to any record release in order to collaborate their care with an outside provider. Patient/Guardian was advised if they have not already done so to contact the registration department to sign all necessary forms in order for [place] to release information regarding their care.   Consent: Patient/Guardian gives verbal consent for treatment and assignment of benefits for services provided during this visit. Patient/Guardian expressed understanding and agreed to proceed.   The duration of this appointment visit was 20 minutes of face-to-face time with the patient.  Greater than 50% of this time was spent in counseling, explanation of  diagnosis, planning of further management, and coordination of care.   Norman Clay, MD 12/20/2021, 3:51 PM

## 2021-12-18 ENCOUNTER — Encounter: Payer: Self-pay | Admitting: *Deleted

## 2021-12-20 ENCOUNTER — Ambulatory Visit (INDEPENDENT_AMBULATORY_CARE_PROVIDER_SITE_OTHER): Payer: Medicare Other | Admitting: Psychiatry

## 2021-12-20 ENCOUNTER — Encounter: Payer: Self-pay | Admitting: Psychiatry

## 2021-12-20 VITALS — BP 146/84 | HR 73 | Temp 98.7°F | Wt 167.2 lb

## 2021-12-20 DIAGNOSIS — F429 Obsessive-compulsive disorder, unspecified: Secondary | ICD-10-CM | POA: Diagnosis not present

## 2021-12-20 DIAGNOSIS — F3342 Major depressive disorder, recurrent, in full remission: Secondary | ICD-10-CM

## 2022-01-16 ENCOUNTER — Encounter (INDEPENDENT_AMBULATORY_CARE_PROVIDER_SITE_OTHER): Payer: Self-pay

## 2022-01-28 ENCOUNTER — Other Ambulatory Visit: Payer: Self-pay | Admitting: Family Medicine

## 2022-01-28 DIAGNOSIS — E782 Mixed hyperlipidemia: Secondary | ICD-10-CM

## 2022-01-29 NOTE — Telephone Encounter (Signed)
Requested Prescriptions  Pending Prescriptions Disp Refills  . rosuvastatin (CRESTOR) 10 MG tablet [Pharmacy Med Name: ROSUVASTATIN CALCIUM 10 MG TAB] 90 tablet     Sig: TAKE 1 TABLET BY MOUTH DAILY     Cardiovascular:  Antilipid - Statins 2 Failed - 01/28/2022  9:42 AM      Failed - Lipid Panel in normal range within the last 12 months    Cholesterol, Total  Date Value Ref Range Status  04/25/2021 138 100 - 199 mg/dL Final   LDL Chol Calc (NIH)  Date Value Ref Range Status  04/25/2021 68 0 - 99 mg/dL Final   HDL  Date Value Ref Range Status  04/25/2021 48 >39 mg/dL Final   Triglycerides  Date Value Ref Range Status  04/25/2021 121 0 - 149 mg/dL Final         Passed - Cr in normal range and within 360 days    Creatinine, Ser  Date Value Ref Range Status  04/25/2021 1.00 0.76 - 1.27 mg/dL Final         Passed - Patient is not pregnant      Passed - Valid encounter within last 12 months    Recent Outpatient Visits          3 months ago [facility] Gwyneth Sprout, FNP   6 months ago Primary hypertension   [facility] Jerrol Banana., MD   9 months ago Encounter for physical examination   [facility] Mikey Kirschner, PA-C   1 year ago Primary hypertension   [facility] Jerrol Banana., MD   1 year ago Mixed hyperlipidemia   [facility] Jerrol Banana., MD      Future Appointments            In 3 months Jerrol Banana., MD [facility] [place]

## 2022-02-12 ENCOUNTER — Encounter: Payer: Self-pay | Admitting: Family Medicine

## 2022-02-13 ENCOUNTER — Encounter: Payer: Self-pay | Admitting: Family Medicine

## 2022-02-21 DIAGNOSIS — L03115 Cellulitis of right lower limb: Secondary | ICD-10-CM | POA: Diagnosis not present

## 2022-02-21 DIAGNOSIS — M25571 Pain in right ankle and joints of right foot: Secondary | ICD-10-CM | POA: Diagnosis not present

## 2022-03-01 ENCOUNTER — Other Ambulatory Visit: Payer: Self-pay | Admitting: Podiatry

## 2022-03-01 ENCOUNTER — Ambulatory Visit
Admission: RE | Admit: 2022-03-01 | Discharge: 2022-03-01 | Disposition: A | Discharge status: Home / Self Care | Payer: Medicare Other | Source: Ambulatory Visit | Attending: Podiatry | Admitting: Podiatry

## 2022-03-01 DIAGNOSIS — L03115 Cellulitis of right lower limb: Secondary | ICD-10-CM | POA: Diagnosis not present

## 2022-03-01 DIAGNOSIS — R600 Localized edema: Secondary | ICD-10-CM

## 2022-03-18 DIAGNOSIS — L03115 Cellulitis of right lower limb: Secondary | ICD-10-CM | POA: Diagnosis not present

## 2022-03-18 DIAGNOSIS — R600 Localized edema: Secondary | ICD-10-CM | POA: Diagnosis not present

## 2022-03-18 NOTE — Progress Notes (Unsigned)
[place] MD/PA/NP OP Progress Note  03/18/2022 8:04 AM Lamar Sprinkles.  MRN:  [id number]  Chief Complaint: No chief complaint on file.  HPI: *** Visit Diagnosis: No diagnosis found.  Past Psychiatric History: Please see initial evaluation for full details. I have reviewed the history. No updates at this time.     Past Medical History:  Past Medical History:  Diagnosis Date   Anancastic neurosis    Anginal pain ([place])    Anxiety, generalized    Bipolar disorder ([place])    Chronic diarrhea    Depression    Hypertension    Hypothyroidism    Obsessive compulsive disorder    OCD (obsessive compulsive disorder)    Sleep apnea    patient states it was "marginal", no CPAP    Past Surgical History:  Procedure Laterality Date   COLONOSCOPY WITH PROPOFOL N/A 02/05/2016   Procedure: COLONOSCOPY WITH PROPOFOL;  Surgeon: [name], MD;  Location: [facility];  Service: Endoscopy;  Laterality: N/A;   MOUTH SURGERY     orthodontic surgery for underbite   MYRINGOTOMY WITH TUBE PLACEMENT     NASAL SEPTUM SURGERY     NASAL SINUS SURGERY Bilateral 10/09/2020   POLYPECTOMY N/A 02/05/2016   Procedure: POLYPECTOMY;  Surgeon: [name], MD;  Location: [facility];  Service: Endoscopy;  Laterality: N/A;    Family Psychiatric History: Please see initial evaluation for full details. I have reviewed the history. No updates at this time.     Family History:  Family History  Problem Relation Age of Onset   Depression Mother    Anxiety disorder Mother    Congestive Heart Failure Father    Prostate cancer Father    Hypertension Father     Social History:  Social History   Socioeconomic History   Marital status: Single    Spouse name: Not on file   Number of children: 0   Years of education: Not on file   Highest education level: Some college, no degree  Occupational History   Not on file  Tobacco Use   Smoking status: Never   Smokeless tobacco: Never  Vaping  Use   Vaping Use: Never used  Substance and Sexual Activity   Alcohol use: Not Currently    Comment: rarely   Drug use: No   Sexual activity: Never    Birth control/protection: Abstinence  Other Topics Concern   Not on file  Social History Narrative   Not on file   Social Determinants of Health   Financial Resource Strain: Low Risk  (12/13/2021)   Overall Financial Resource Strain (CARDIA)    Difficulty of Paying Living Expenses: Not very hard  Food Insecurity: No Food Insecurity (12/13/2021)   Hunger Vital Sign    Worried About Running Out of Food in the Last Year: Never true    Ran Out of Food in the Last Year: Never true  Transportation Needs: No Transportation Needs (12/13/2021)   PRAPARE - Hydrologist (Medical): No    Lack of Transportation (Non-Medical): No  Physical Activity: Insufficiently Active (12/13/2021)   Exercise Vital Sign    Days of Exercise per Week: 2 days    Minutes of Exercise per Session: 30 min  Stress: No Stress Concern Present (12/13/2021)   [place]    Feeling of Stress : Not at all  Social Connections: Moderately Isolated (12/13/2021)   Social Connection and Isolation  Panel [NHANES]    Frequency of Communication with Friends and Family: More than three times a week    Frequency of Social Gatherings with Friends and Family: Twice a week    Attends Religious Services: More than 4 times per year    Active Member of [employer] or Organizations: No    Attends Archivist Meetings: Never    Marital Status: Never married    Allergies:  Allergies  Allergen Reactions   Penicillins Hives and Rash   Sulfa Antibiotics Hives   Amoxicillin Hives    Metabolic Disorder Labs: Lab Results  Component Value Date   HGBA1C 5.1 11/14/2015   Lab Results  Component Value Date   PROLACTIN 6.8 11/14/2015   Lab Results  Component Value Date   CHOL 138 04/25/2021    TRIG 121 04/25/2021   HDL 48 04/25/2021   CHOLHDL 2.9 04/25/2021   VLDL 49 (H) 11/14/2015   LDLCALC 68 04/25/2021   LDLCALC 75 06/11/2018   Lab Results  Component Value Date   TSH 1.160 08/05/2019   TSH 0.478 11/20/2018    Therapeutic Level Labs: No results found for: "LITHIUM" No results found for: "VALPROATE" No results found for: "CBMZ"  Current Medications: Current Outpatient Medications  Medication Sig Dispense Refill   Ascorbic Acid (VITAMIN C) 100 MG tablet Take 100 mg by mouth daily.     B Complex Vitamins (VITAMIN B COMPLEX) TABS      cetirizine-pseudoephedrine (ZYRTEC-D ALLERGY & CONGESTION) 5-120 MG tablet      Cholecalciferol (VITAMIN D3) 10 MCG (400 UNIT) CAPS      diphenoxylate-atropine (LOMOTIL) 2.5-0.025 MG tablet Take 2 tablets by mouth 4 (four) times daily as needed for diarrhea or loose stools. 240 tablet 5   Eluxadoline (VIBERZI) 100 MG TABS Take 1 tablet (100 mg total) by mouth 2 (two) times daily. 60 tablet 5   hyoscyamine (ANASPAZ) 0.125 MG TBDP disintergrating tablet Place 1 tablet (0.125 mg total) under the tongue every 4 (four) hours as needed. 180 tablet 1   Multiple Vitamin (MULTIVITAMIN WITH MINERALS) TABS tablet Take 1 tablet by mouth daily.     rosuvastatin (CRESTOR) 10 MG tablet TAKE 1 TABLET BY MOUTH DAILY 90 tablet 0   sildenafil (VIAGRA) 50 MG tablet Take 1 tablet (50 mg total) by mouth as needed for erectile dysfunction. 25 tablet 4   verapamil (CALAN-SR) 240 MG CR tablet TAKE 1 TABLET BY MOUTH DAILY 90 tablet 3   No current facility-administered medications for this visit.     Musculoskeletal: Strength & Muscle Tone: within normal limits Gait & Station: normal Patient leans: N/A  Psychiatric Specialty Exam: Review of Systems  There were no vitals taken for this visit.There is no height or weight on file to calculate BMI.  General Appearance: {Appearance:22683}  Eye Contact:  {BHH EYE CONTACT:22684}  Speech:  Clear and Coherent   Volume:  Normal  Mood:  {BHH MOOD:22306}  Affect:  {Affect (PAA):22687}  Thought Process:  Coherent  Orientation:  Full (Time, Place, and Person)  Thought Content: Logical   Suicidal Thoughts:  {ST/HT (PAA):22692}  Homicidal Thoughts:  {ST/HT (PAA):22692}  Memory:  Immediate;   Good  Judgement:  {Judgement (PAA):22694}  Insight:  {Insight (PAA):22695}  Psychomotor Activity:  Normal  Concentration:  Concentration: Good and Attention Span: Good  Recall:  Good  Fund of Knowledge: Good  Language: Good  Akathisia:  No  Handed:  Right  AIMS (if indicated): not done  Assets:  Communication Skills  Desire for Improvement  ADL's:  Intact  Cognition: WNL  Sleep:  {BHH GOOD/FAIR/POOR:22877}   Screenings: AIMS    Flowsheet Row Admission (Discharged) from 11/18/2018 in [facility] Admission (Discharged) from 12/31/2016 in [facility] Admission (Discharged) from 11/10/2015 in [facility] Total Score 0 0 0      AUDIT    Flowsheet Row Admission (Discharged) from 11/18/2018 in [facility] Admission (Discharged) from 12/31/2016 in [facility] Admission (Discharged) from 11/10/2015 in [facility]  Alcohol Use Disorder Identification Test Final Score (AUDIT) 0 1 0      PHQ2-9    [facility] Office Visit from 12/20/2021 in [facility] from 12/13/2021 in [facility] Visit from 11/13/2021 in [facility] Office Visit from 07/26/2021 in [facility] Visit from 04/25/2021 in [facility]  PHQ-2 Total Score 0 0 0 0 0  PHQ-9 Total Score 0 0 -- 0 0      Flowsheet Row ED from 06/14/2021 in [facility]  Admission (Discharged) from 11/18/2018 in [facility] ED  from 11/17/2018 in [facility]  C-SSRS RISK CATEGORY No Risk No Risk High Risk        Assessment and Plan:  Chandon Lazcano. is a 61 y.o. year old male with a history of bipolar disorder by chart, OCD, hypertension, hyperlipidemia, hypothyroidism, who presents for follow up appointment for below.    1. MDD (major depressive disorder), recurrent, in full remission ([place]) 2. Obsessive-compulsive disorder, unspecified type # r/o somatic symptoms disorder He denies significant mood symptoms over the past few years without being on psychotropics at least for the past year.  He does have physical symptoms of rhinorrhea, chronic diarrhea, and reports stress due to these.  Although he reports that his mood was better when he was on psychotropics, he currently denies any significant mood symptoms to pursue treatment especially given his known adverse reaction from several antipsychotics.  He agrees to hold starting any medication at this time. Noted that although he did try [place] in the past, it has limited benefit. Also noted that although he reports history of bipolar disorder many years ago, he denies any manic disorder in the past.  There is no known episode of mania in discharge summary either.  His clinical course is more consistent with depression.   Plan No psychotropic is prescribed at this time.  Next appointment: 10/12 at 3 PM for 30 mins, in person- call for sooner appointment if any worsening in his mood symptoms     The patient demonstrates the following risk factors for suicide: Chronic risk factors for suicide include: psychiatric disorder of depression . Acute risk factors for suicide include: unemployment. Protective factors for this patient include: hope for the future. Considering these factors, the overall suicide risk at this point appears to be low. Patient is appropriate for outpatient follow up.     Past trials of medication:  sertraline, fluoxetine, lexapro, fluoxetine, Paxil, duloxetine, venlafaxine, clomipramine (constipation), bupropion, Trintellix, Abilify, Vraylar (1.5 mg caused hypertension)           Collaboration of Care: Collaboration of Care: {BH OP Collaboration of Care:21014065}  Patient/Guardian was advised Release of Information must be obtained prior to any record release in order to collaborate their care with an outside  provider. Patient/Guardian was advised if they have not already done so to contact the registration department to sign all necessary forms in order for [place] to release information regarding their care.   Consent: Patient/Guardian gives verbal consent for treatment and assignment of benefits for services provided during this visit. Patient/Guardian expressed understanding and agreed to proceed.    Norman Clay, MD 03/18/2022, 8:04 AM

## 2022-03-19 ENCOUNTER — Ambulatory Visit (INDEPENDENT_AMBULATORY_CARE_PROVIDER_SITE_OTHER): Payer: Medicare Other | Admitting: Psychiatry

## 2022-03-19 ENCOUNTER — Encounter: Payer: Self-pay | Admitting: Psychiatry

## 2022-03-19 VITALS — BP 146/88 | HR 77 | Temp 97.3°F | Ht 67.0 in | Wt 167.0 lb

## 2022-03-19 DIAGNOSIS — F429 Obsessive-compulsive disorder, unspecified: Secondary | ICD-10-CM

## 2022-03-19 DIAGNOSIS — F3342 Major depressive disorder, recurrent, in full remission: Secondary | ICD-10-CM | POA: Diagnosis not present

## 2022-03-19 NOTE — Addendum Note (Signed)
Addended by: Norman Clay on: 03/19/2022 03:00 PM   Modules accepted: Level of Service

## 2022-03-19 NOTE — Addendum Note (Signed)
Addended by: Norman Clay on: 03/19/2022 02:59 PM   Modules accepted: Level of Service

## 2022-03-19 NOTE — Addendum Note (Signed)
Addended by: Norman Clay on: 03/19/2022 02:58 PM   Modules accepted: Level of Service

## 2022-03-21 ENCOUNTER — Ambulatory Visit: Payer: Medicare Other | Admitting: Psychiatry

## 2022-04-17 DIAGNOSIS — R600 Localized edema: Secondary | ICD-10-CM | POA: Diagnosis not present

## 2022-04-17 DIAGNOSIS — M79661 Pain in right lower leg: Secondary | ICD-10-CM | POA: Diagnosis not present

## 2022-04-18 DIAGNOSIS — J310 Chronic rhinitis: Secondary | ICD-10-CM | POA: Diagnosis not present

## 2022-04-26 ENCOUNTER — Other Ambulatory Visit: Payer: Self-pay | Admitting: Family Medicine

## 2022-04-26 DIAGNOSIS — I10 Essential (primary) hypertension: Secondary | ICD-10-CM

## 2022-04-26 MED ORDER — VERAPAMIL HCL ER 240 MG PO TBCR: EXTENDED_RELEASE_TABLET | ORAL | 0 refills | Status: DC

## 2022-04-26 NOTE — Telephone Encounter (Signed)
[facility] faxed refill request for the following medications:   verapamil (CALAN-SR) 240 MG CR tablet     Please advise

## 2022-04-26 NOTE — Telephone Encounter (Signed)
NOV: 05/14/2022

## 2022-05-06 ENCOUNTER — Telehealth: Payer: Self-pay | Admitting: Family Medicine

## 2022-05-06 ENCOUNTER — Encounter: Payer: Medicare Other | Admitting: Family Medicine

## 2022-05-06 DIAGNOSIS — E782 Mixed hyperlipidemia: Secondary | ICD-10-CM

## 2022-05-06 MED ORDER — ROSUVASTATIN CALCIUM 10 MG PO TABS: 10.0000 mg | ORAL_TABLET | Freq: Every day | ORAL | 0 refills | Status: DC

## 2022-05-06 NOTE — Telephone Encounter (Signed)
[facility] faxed refill request for the following medications:   rosuvastatin (CRESTOR) 10 MG tablet    Please advise

## 2022-05-06 NOTE — Telephone Encounter (Signed)
Done

## 2022-05-10 DIAGNOSIS — R197 Diarrhea, unspecified: Secondary | ICD-10-CM | POA: Diagnosis not present

## 2022-05-14 ENCOUNTER — Encounter: Payer: Self-pay | Admitting: Family Medicine

## 2022-05-14 ENCOUNTER — Ambulatory Visit (INDEPENDENT_AMBULATORY_CARE_PROVIDER_SITE_OTHER): Payer: Medicare Other | Admitting: Family Medicine

## 2022-05-14 VITALS — BP 160/86 | HR 56 | Temp 97.8°F | Resp 16 | Ht 67.0 in | Wt 167.0 lb

## 2022-05-14 DIAGNOSIS — I10 Essential (primary) hypertension: Secondary | ICD-10-CM | POA: Diagnosis not present

## 2022-05-14 DIAGNOSIS — Z6826 Body mass index (BMI) 26.0-26.9, adult: Secondary | ICD-10-CM

## 2022-05-14 DIAGNOSIS — E039 Hypothyroidism, unspecified: Secondary | ICD-10-CM

## 2022-05-14 DIAGNOSIS — E782 Mixed hyperlipidemia: Secondary | ICD-10-CM | POA: Diagnosis not present

## 2022-05-14 DIAGNOSIS — Z0000 Encounter for general adult medical examination without abnormal findings: Secondary | ICD-10-CM

## 2022-05-14 DIAGNOSIS — E785 Hyperlipidemia, unspecified: Secondary | ICD-10-CM

## 2022-05-14 DIAGNOSIS — E663 Overweight: Secondary | ICD-10-CM | POA: Diagnosis not present

## 2022-05-14 NOTE — Patient Instructions (Addendum)
Please check your blood pressure daily for the next few weeks until we meet again and keep record for [place] to review at your next visit.   Your goal blood pressure is less than 140/90.  Health Maintenance, Male Adopting a healthy lifestyle and getting preventive care are important in promoting health and wellness. Ask your health care provider about: The right schedule for you to have regular tests and exams. Things you can do on your own to prevent diseases and keep yourself healthy. What should I know about diet, weight, and exercise? Eat a healthy diet  Eat a diet that includes plenty of vegetables, fruits, low-fat dairy products, and lean protein. Do not eat a lot of foods that are high in solid fats, added sugars, or sodium. Maintain a healthy weight Body mass index (BMI) is a measurement that can be used to identify possible weight problems. It estimates body fat based on height and weight. Your health care provider can help determine your BMI and help you achieve or maintain a healthy weight. Get regular exercise Get regular exercise. This is one of the most important things you can do for your health. Most adults should: Exercise for at least 150 minutes each week. The exercise should increase your heart rate and make you sweat (moderate-intensity exercise). Do strengthening exercises at least twice a week. This is in addition to the moderate-intensity exercise. Spend less time sitting. Even light physical activity can be beneficial. Watch cholesterol and blood lipids Have your blood tested for lipids and cholesterol at 60 years of age, then have this test every 5 years. You may need to have your cholesterol levels checked more often if: Your lipid or cholesterol levels are high. You are older than 60 years of age. You are at high risk for heart disease. What should I know about cancer screening? Many types of cancers can be detected early and may often be prevented. Depending on  your health history and family history, you may need to have cancer screening at various ages. This may include screening for: Colorectal cancer. Prostate cancer. Skin cancer. Lung cancer. What should I know about heart disease, diabetes, and high blood pressure? Blood pressure and heart disease High blood pressure causes heart disease and increases the risk of stroke. This is more likely to develop in people who have high blood pressure readings or are overweight. Talk with your health care provider about your target blood pressure readings. Have your blood pressure checked: Every 3-5 years if you are 24-30 years of age. Every year if you are 37 years old or older. If you are between the ages of 42 and 90 and are a current or former smoker, ask your health care provider if you should have a one-time screening for abdominal aortic aneurysm (AAA). Diabetes Have regular diabetes screenings. This checks your fasting blood sugar level. Have the screening done: Once every three years after age 61 if you are at a normal weight and have a low risk for diabetes. More often and at a younger age if you are overweight or have a high risk for diabetes. What should I know about preventing infection? Hepatitis B If you have a higher risk for hepatitis B, you should be screened for this virus. Talk with your health care provider to find out if you are at risk for hepatitis B infection. Hepatitis C Blood testing is recommended for: Everyone born from [birth year] through 1964. Anyone with known risk factors for hepatitis C. Sexually transmitted  infections (STIs) You should be screened each year for STIs, including gonorrhea and chlamydia, if: You are sexually active and are younger than 60 years of age. You are older than 60 years of age and your health care provider tells you that you are at risk for this type of infection. Your sexual activity has changed since you were last screened, and you are at increased  risk for chlamydia or gonorrhea. Ask your health care provider if you are at risk. Ask your health care provider about whether you are at high risk for HIV. Your health care provider may recommend a prescription medicine to help prevent HIV infection. If you choose to take medicine to prevent HIV, you should first get tested for HIV. You should then be tested every 3 months for as long as you are taking the medicine. Follow these instructions at home: Alcohol use Do not drink alcohol if your health care provider tells you not to drink. If you drink alcohol: Limit how much you have to 0-2 drinks a day. Know how much alcohol is in your drink. In the [place] one drink equals one 12 oz bottle of beer (355 mL), one 5 oz glass of wine (148 mL), or one 1 oz glass of hard liquor (44 mL). Lifestyle Do not use any products that contain nicotine or tobacco. These products include cigarettes, chewing tobacco, and vaping devices, such as e-cigarettes. If you need help quitting, ask your health care provider. Do not use street drugs. Do not share needles. Ask your health care provider for help if you need support or information about quitting drugs. General instructions Schedule regular health, dental, and eye exams. Stay current with your vaccines. Tell your health care provider if: You often feel depressed. You have ever been abused or do not feel safe at home. Summary Adopting a healthy lifestyle and getting preventive care are important in promoting health and wellness. Follow your health care provider's instructions about healthy diet, exercising, and getting tested or screened for diseases. Follow your health care provider's instructions on monitoring your cholesterol and blood pressure. This information is not intended to replace advice given to you by your health care provider. Make sure you discuss any questions you have with your health care provider. Document Revised: 10/16/2020 Document  Reviewed: 10/16/2020 Elsevier Patient Education  [place].

## 2022-05-14 NOTE — Progress Notes (Signed)
I,Joseline E Rosas,acting as a scribe for [employer], MD.,have documented all relevant documentation on the behalf of [name], MD,as directed by  [name], MD while in the presence of [name], MD.   Complete physical exam   Patient: William Coleman.   DOB: 10-Jul-1960   61 y.o. Male  MRN: [id number] Visit Date: 05/14/2022  Today's healthcare provider: [name], MD   Chief Complaint  Patient presents with   Annual Exam   Subjective    William Coleman. is a 61 y.o. male who presents today for a complete physical exam.  He reports consuming a general diet. The patient does not participate in regular exercise at present. He generally feels well. He reports sleeping well. He does not have additional problems to discuss today.  HPI    Patient had AWV with [facility] on 12/13/2021 Patient is follow by GI, Psych and Otaryngologist  Past Medical History:  Diagnosis Date   Anancastic neurosis    Anginal pain ([place])    Anxiety, generalized    Bipolar disorder ([place])    Chronic diarrhea    Depression    Hypertension    Hypothyroidism    Obsessive compulsive disorder    OCD (obsessive compulsive disorder)    Sleep apnea    patient states it was "marginal", no CPAP   Past Surgical History:  Procedure Laterality Date   COLONOSCOPY WITH PROPOFOL N/A 02/05/2016   Procedure: COLONOSCOPY WITH PROPOFOL;  Surgeon: [name], MD;  Location: [place];  Service: Endoscopy;  Laterality: N/A;   MOUTH SURGERY     orthodontic surgery for underbite   MYRINGOTOMY WITH TUBE PLACEMENT     NASAL SEPTUM SURGERY     NASAL SINUS SURGERY Bilateral 10/09/2020   POLYPECTOMY N/A 02/05/2016   Procedure: POLYPECTOMY;  Surgeon: [name], MD;  Location: [facility];  Service: Endoscopy;  Laterality: N/A;   Social History   Socioeconomic History   Marital status: Single    Spouse name: Not  on file   Number of children: 0   Years of education: Not on file   Highest education level: Some college, no degree  Occupational History   Not on file  Tobacco Use   Smoking status: Never   Smokeless tobacco: Never  Vaping Use   Vaping Use: Never used  Substance and Sexual Activity   Alcohol use: Not Currently    Comment: rarely   Drug use: No   Sexual activity: Never    Birth control/protection: Abstinence  Other Topics Concern   Not on file  Social History Narrative   Not on file   Social Determinants of Health   Financial Resource Strain: Low Risk  (12/13/2021)   Overall Financial Resource Strain (CARDIA)    Difficulty of Paying Living Expenses: Not very hard  Food Insecurity: No Food Insecurity (12/13/2021)   Hunger Vital Sign    Worried About Running Out of Food in the Last Year: Never true    Ran Out of Food in the Last Year: Never true  Transportation Needs: No Transportation Needs (12/13/2021)   PRAPARE - Hydrologist (Medical): No    Lack of Transportation (Non-Medical): No  Physical Activity: Insufficiently Active (12/13/2021)   Exercise Vital Sign    Days of Exercise per Week: 2 days    Minutes of Exercise per Session: 30 min  Stress: No Stress Concern Present (12/13/2021)   [employer] - Occupational Stress Questionnaire    Feeling of Stress : Not at all  Social Connections: Moderately Isolated (12/13/2021)   Social Connection and Isolation Panel [NHANES]    Frequency of Communication with Friends and Family: More than three times a week    Frequency of Social Gatherings with Friends and Family: Twice a week    Attends Religious Services: More than 4 times per year    Active Member of [employer] or Organizations: No    Attends Archivist Meetings: Never    Marital Status: Never married  Intimate Partner Violence: Not At Risk (12/13/2021)   Humiliation, Afraid, Rape, and Kick questionnaire    Fear of  Current or Ex-Partner: No    Emotionally Abused: No    Physically Abused: No    Sexually Abused: No   Family Status  Relation Name Status   Mother  Deceased   Father  Deceased   Sister  Alive   Family History  Problem Relation Age of Onset   Depression Mother    Anxiety disorder Mother    Congestive Heart Failure Father    Prostate cancer Father    Hypertension Father    Allergies  Allergen Reactions   Penicillins Hives and Rash   Sulfa Antibiotics Hives   Amoxicillin Hives    Patient Care Team: Simmons-Robinson, Riki Sheer, MD as PCP - General (Family Medicine)   Medications: Outpatient Medications Prior to Visit  Medication Sig   Ascorbic Acid (VITAMIN C) 100 MG tablet Take 100 mg by mouth daily.   B Complex Vitamins (VITAMIN B COMPLEX) TABS    cetirizine-pseudoephedrine (ZYRTEC-D ALLERGY & CONGESTION) 5-120 MG tablet    Cholecalciferol (VITAMIN D3) 10 MCG (400 UNIT) CAPS    diphenoxylate-atropine (LOMOTIL) 2.5-0.025 MG tablet Take 2 tablets by mouth 4 (four) times daily as needed for diarrhea or loose stools.   hyoscyamine (ANASPAZ) 0.125 MG TBDP disintergrating tablet Place 1 tablet (0.125 mg total) under the tongue every 4 (four) hours as needed.   Multiple Vitamin (MULTIVITAMIN WITH MINERALS) TABS tablet Take 1 tablet by mouth daily.   rosuvastatin (CRESTOR) 10 MG tablet Take 1 tablet (10 mg total) by mouth daily.   sildenafil (VIAGRA) 50 MG tablet Take 1 tablet (50 mg total) by mouth as needed for erectile dysfunction.   verapamil (CALAN-SR) 240 MG CR tablet TAKE 1 TABLET BY MOUTH DAILY   Eluxadoline (VIBERZI) 100 MG TABS Take 1 tablet (100 mg total) by mouth 2 (two) times daily.   No facility-administered medications prior to visit.    Review of Systems  HENT:  Positive for congestion, hearing loss, postnasal drip and rhinorrhea.   Respiratory:  Positive for cough.   Gastrointestinal:  Positive for diarrhea.  Allergic/Immunologic: Positive for environmental  allergies.  All other systems reviewed and are negative.     Objective    BP (!) 160/86 (BP Location: Left Arm, Patient Position: Sitting, Cuff Size: Normal)   Pulse (!) 56   Temp 97.8 F (36.6 C) (Oral)   Resp 16   Ht 5' 7" (1.702 m)   Wt 167 lb (75.8 kg)   BMI 26.16 kg/m     Physical Exam Vitals reviewed.  Constitutional:      General: He is not in acute distress.    Appearance: Normal appearance. He is not ill-appearing, toxic-appearing or diaphoretic.  HENT:     Head: Normocephalic and atraumatic.     Right Ear: Tympanic membrane and external ear normal.  Left Ear: Tympanic membrane and external ear normal.     Nose: Nose normal.     Mouth/Throat:     Mouth: Mucous membranes are moist.     Pharynx: No oropharyngeal exudate or posterior oropharyngeal erythema.  Eyes:     General: No scleral icterus.    Extraocular Movements: Extraocular movements intact.     Conjunctiva/sclera: Conjunctivae normal.     Pupils: Pupils are equal, round, and reactive to light.  Neck:     Vascular: No carotid bruit.  Cardiovascular:     Rate and Rhythm: Normal rate and regular rhythm.     Pulses: Normal pulses.     Heart sounds: Normal heart sounds. No murmur heard.    No friction rub. No gallop.  Pulmonary:     Effort: Pulmonary effort is normal. No respiratory distress.     Breath sounds: Normal breath sounds. No stridor. No wheezing, rhonchi or rales.  Abdominal:     General: Bowel sounds are normal. There is no distension.     Palpations: Abdomen is soft.     Tenderness: There is no abdominal tenderness.  Musculoskeletal:        General: No swelling, tenderness or signs of injury. Normal range of motion.     Cervical back: Normal range of motion and neck supple. No rigidity or tenderness.     Right lower leg: No edema.     Left lower leg: No edema.  Lymphadenopathy:     Cervical: No cervical adenopathy.  Skin:    General: Skin is warm and dry.     Capillary Refill:  Capillary refill takes less than 2 seconds.     Findings: No erythema or rash.  Neurological:     Mental Status: He is alert and oriented to person, place, and time.     Motor: No weakness.     Gait: Gait normal.  Psychiatric:        Attention and Perception: Attention normal.        Mood and Affect: Mood normal.        Speech: Speech normal.        Behavior: Behavior normal. Behavior is cooperative.        Thought Content: Thought content normal.       Last depression screening scores    05/14/2022    3:03 PM 03/19/2022    2:16 PM 12/20/2021    3:16 PM  PHQ 2/9 Scores  PHQ - 2 Score 0    PHQ- 9 Score 0       Information is confidential and restricted. Go to Review Flowsheets to unlock data.   Last fall risk screening    05/14/2022    3:03 PM  [place] in the past year? 0  Number falls in past yr: 0  Injury with Fall? 0  Risk for fall due to : No Fall Risks   Last Audit-C alcohol use screening    05/14/2022    3:04 PM  Alcohol Use Disorder Test (AUDIT)  1. How often do you have a drink containing alcohol? 1  2. How many drinks containing alcohol do you have on a typical day when you are drinking? 0  3. How often do you have six or more drinks on one occasion? 0  AUDIT-C Score 1   A score of 3 or more in women, and 4 or more in men indicates increased risk for alcohol abuse, EXCEPT if all of the points  are from question 1   Results for orders placed or performed in visit on 05/14/22  Comprehensive metabolic panel  Result Value Ref Range   Glucose 82 70 - 99 mg/dL   BUN 16 8 - 27 mg/dL   Creatinine, Ser 1.07 0.76 - 1.27 mg/dL   eGFR 79 >59 mL/min/1.73   BUN/Creatinine Ratio 15 10 - 24   Sodium 140 134 - 144 mmol/L   Potassium 3.8 3.5 - 5.2 mmol/L   Chloride 100 96 - 106 mmol/L   CO2 25 20 - 29 mmol/L   Calcium 9.2 8.6 - 10.2 mg/dL   Total Protein 6.7 6.0 - 8.5 g/dL   Albumin 4.5 3.8 - 4.9 g/dL   Globulin, Total 2.2 1.5 - 4.5 g/dL   Albumin/Globulin  Ratio 2.0 1.2 - 2.2   Bilirubin Total 0.7 0.0 - 1.2 mg/dL   Alkaline Phosphatase 66 44 - 121 IU/L   AST 27 0 - 40 IU/L   ALT 26 0 - 44 IU/L  Lipid panel  Result Value Ref Range   Cholesterol, Total 151 100 - 199 mg/dL   Triglycerides 148 0 - 149 mg/dL   HDL 47 >39 mg/dL   VLDL Cholesterol Cal 26 5 - 40 mg/dL   LDL Chol Calc (NIH) 78 0 - 99 mg/dL   Chol/HDL Ratio 3.2 0.0 - 5.0 ratio  Hemoglobin A1c  Result Value Ref Range   Hgb A1c MFr Bld 5.3 4.8 - 5.6 %   Est. average glucose Bld gHb Est-mCnc 105 mg/dL  TSH + free T4  Result Value Ref Range   TSH 0.730 0.450 - 4.500 uIU/mL   Free T4 1.11 0.82 - 1.77 ng/dL    Assessment & Plan    Routine Health Maintenance and Physical Exam  Exercise Activities and Dietary recommendations  Goals      DIET - EAT MORE FRUITS AND VEGETABLES        Immunization History  Administered Date(s) Administered   COVID-19, mRNA, vaccine(Comirnaty)12 years and older 09/16/2019, 10/07/2019, 06/19/2020   Hep A / Hep B 07/24/2007, 08/21/2007, 02/12/2008   Hepatitis A 07/24/2007, 08/21/2007, 02/12/2008   Hepatitis A, Adult 07/24/2007, 08/21/2007, 02/12/2008   Hepatitis B 07/24/2007, 07/24/2007, 08/21/2007, 08/21/2007, 02/12/2008, 02/12/2008   Influenza Split 03/23/2010, 02/26/2016   Influenza,inj,Quad PF,6+ Mos 03/07/2014, 03/10/2017, 02/22/2019, 03/13/2020, 03/15/2022   Influenza-Unspecified 05/13/1997, 05/11/2018, 02/23/2019, 03/13/2020, 03/14/2021   PFIZER Comirnaty(Gray Top)Covid-19 Tri-Sucrose Vaccine 04/11/2022   PFIZER(Purple Top)SARS-COV-2 Vaccination 11/30/2020   Pfizer Covid-19 Vaccine Bivalent Booster 81yr & up 03/30/2021   Td 07/02/2017, 07/02/2017   Tdap 07/24/2007    Health Maintenance  Topic Date Due   Zoster Vaccines- Shingrix (1 of 2) Never done   COVID-19 Vaccine (7 - 2023-24 season) 06/06/2022   Medicare Annual Wellness (AWV)  05/15/2023   DTaP/Tdap/Td (4 - Td or Tdap) 07/03/2027   COLONOSCOPY (Pts 45-427yrInsurance  coverage will need to be confirmed)  12/04/2031   INFLUENZA VACCINE  Completed   Hepatitis C Screening  Completed   HIV Screening  Completed   HPV VACCINES  Aged Out    Discussed health benefits of physical activity, and encouraged him to engage in regular exercise appropriate for his age and condition.  Problem List Items Addressed This Visit       Cardiovascular and Mediastinum   HTN (hypertension) - Primary    BP not at goal  Recommended monitoring home BP for next few weeks and we will compare home recordings vs office measurements to determine  next steps  CmP collected today  Continue verapamil 230m daily        Relevant Orders   Comprehensive metabolic panel (Completed)     Other   HLD (hyperlipidemia)    Stable  Lipid panel collected today  Continue crestor 164mdaily       Relevant Orders   Lipid panel (Completed)   Overweight with body mass index (BMI) of 26 to 26.9 in adult    CMP  A1c  Lipid panel        Relevant Orders   Hemoglobin A1c (Completed)   Other Visit Diagnoses     Adult hypothyroidism       Relevant Orders   TSH + free T4 (Completed)        Return in about 1 month (around 06/14/2022) for HTN .     I, MaEulis FosterMD, have reviewed all documentation for this visit.  Portions of this information were initially documented by the CMA and reviewed by me for thoroughness and accuracy.      MaEulis FosterMD  [phone]) [phone])  [place]

## 2022-05-15 LAB — COMPREHENSIVE METABOLIC PANEL
ALT: 26 IU/L (ref 0–44)
AST: 27 IU/L (ref 0–40)
Albumin/Globulin Ratio: 2 (ref 1.2–2.2)
Albumin: 4.5 g/dL (ref 3.8–4.9)
Alkaline Phosphatase: 66 IU/L (ref 44–121)
BUN/Creatinine Ratio: 15 (ref 10–24)
BUN: 16 mg/dL (ref 8–27)
Bilirubin Total: 0.7 mg/dL (ref 0.0–1.2)
CO2: 25 mmol/L (ref 20–29)
Calcium: 9.2 mg/dL (ref 8.6–10.2)
Chloride: 100 mmol/L (ref 96–106)
Creatinine, Ser: 1.07 mg/dL (ref 0.76–1.27)
Globulin, Total: 2.2 g/dL (ref 1.5–4.5)
Glucose: 82 mg/dL (ref 70–99)
Potassium: 3.8 mmol/L (ref 3.5–5.2)
Sodium: 140 mmol/L (ref 134–144)
Total Protein: 6.7 g/dL (ref 6.0–8.5)
eGFR: 79 mL/min/{1.73_m2} (ref >59)

## 2022-05-15 LAB — TSH+FREE T4
Free T4: 1.11 ng/dL (ref 0.82–1.77)
TSH: 0.73 u[IU]/mL (ref 0.450–4.500)

## 2022-05-15 LAB — HEMOGLOBIN A1C
Est. average glucose Bld gHb Est-mCnc: 105 mg/dL
Hgb A1c MFr Bld: 5.3 % (ref 4.8–5.6)

## 2022-05-15 LAB — LIPID PANEL
Chol/HDL Ratio: 3.2 ratio (ref 0.0–5.0)
Cholesterol, Total: 151 mg/dL (ref 100–199)
HDL: 47 mg/dL (ref >39)
LDL Chol Calc (NIH): 78 mg/dL (ref 0–99)
Triglycerides: 148 mg/dL (ref 0–149)
VLDL Cholesterol Cal: 26 mg/dL (ref 5–40)

## 2022-05-16 ENCOUNTER — Ambulatory Visit (INDEPENDENT_AMBULATORY_CARE_PROVIDER_SITE_OTHER): Payer: Medicare Other | Admitting: Nurse Practitioner

## 2022-05-16 ENCOUNTER — Encounter (INDEPENDENT_AMBULATORY_CARE_PROVIDER_SITE_OTHER): Payer: Self-pay | Admitting: Nurse Practitioner

## 2022-05-16 VITALS — BP 143/85 | HR 69 | Resp 16 | Ht 67.0 in | Wt 165.0 lb

## 2022-05-16 DIAGNOSIS — I10 Essential (primary) hypertension: Secondary | ICD-10-CM | POA: Diagnosis not present

## 2022-05-16 DIAGNOSIS — M7989 Other specified soft tissue disorders: Secondary | ICD-10-CM

## 2022-05-16 DIAGNOSIS — M79661 Pain in right lower leg: Secondary | ICD-10-CM | POA: Diagnosis not present

## 2022-05-16 DIAGNOSIS — E782 Mixed hyperlipidemia: Secondary | ICD-10-CM

## 2022-05-20 ENCOUNTER — Telehealth: Payer: Self-pay

## 2022-05-20 NOTE — Telephone Encounter (Signed)
Copied from Box [id number]. Topic: General - Other >> May 20, 2022 11:12 AM Cyndi Bender wrote: Reason for CRM: Pt requests call back to go over his most recent lab results. Cb# [phone]

## 2022-05-21 NOTE — Telephone Encounter (Signed)
Patient aware that we did not order PSA and we can check on his appointment 01/08

## 2022-05-22 DIAGNOSIS — J328 Other chronic sinusitis: Secondary | ICD-10-CM | POA: Diagnosis not present

## 2022-05-22 NOTE — Assessment & Plan Note (Signed)
BP not at goal  Recommended monitoring home BP for next few weeks and we will compare home recordings vs office measurements to determine next steps  CmP collected today  Continue verapamil '240mg'$  daily

## 2022-05-22 NOTE — Assessment & Plan Note (Signed)
CMP  A1c  Lipid panel

## 2022-05-22 NOTE — Assessment & Plan Note (Signed)
Stable  Lipid panel collected today  Continue crestor '10mg'$  daily

## 2022-05-27 ENCOUNTER — Other Ambulatory Visit (INDEPENDENT_AMBULATORY_CARE_PROVIDER_SITE_OTHER): Payer: Self-pay | Admitting: Nurse Practitioner

## 2022-05-27 ENCOUNTER — Encounter (INDEPENDENT_AMBULATORY_CARE_PROVIDER_SITE_OTHER): Payer: Self-pay | Admitting: Nurse Practitioner

## 2022-05-27 DIAGNOSIS — R600 Localized edema: Secondary | ICD-10-CM

## 2022-05-27 DIAGNOSIS — M79605 Pain in left leg: Secondary | ICD-10-CM

## 2022-05-27 NOTE — Progress Notes (Signed)
Subjective:    Patient ID: William Coleman., male    DOB: 1961-01-10, 61 y.o.   MRN: [id number] Chief Complaint  Patient presents with   New Patient (Initial Visit)    Consult right leg edema and pain    William Coleman is a 61 year old male referred by Dr. Cleda Mccreedy in regards to swelling in his right foot and lower leg area.  This has been present for the last 2 to 3 months.  He tried compression but for very short time due to the difficulty with use.  The patient does have some hyperpigmentation noted.  He had a previous DVT study which was negative.  Currently he has no open wounds or ulcerations.  No complaints of weeping or blistering.  He denies any known precipitating factors of this developed gradually.    Review of Systems  Cardiovascular:  Positive for leg swelling.  All other systems reviewed and are negative.      Objective:   Physical Exam Vitals reviewed.  HENT:     Head: Normocephalic.  Cardiovascular:     Rate and Rhythm: Normal rate.     Pulses: Normal pulses.  Pulmonary:     Effort: Pulmonary effort is normal.  Musculoskeletal:     Right lower leg: Edema present.  Skin:    General: Skin is warm and dry.  Neurological:     Mental Status: He is alert and oriented to person, place, and time.  Psychiatric:        Mood and Affect: Mood normal.        Behavior: Behavior normal.        Thought Content: Thought content normal.        Judgment: Judgment normal.     BP (!) 143/85 (BP Location: Left Arm)   Pulse 69   Resp 16   Ht '5\' 7"'$  (1.702 m)   Wt 165 lb (74.8 kg)   BMI 25.84 kg/m   Past Medical History:  Diagnosis Date   Anancastic neurosis    Anginal pain ([facility])    Anxiety, generalized    Bipolar disorder ([facility])    Chronic diarrhea    Depression    Hypertension    Hypothyroidism    Obsessive compulsive disorder    OCD (obsessive compulsive disorder)    Sleep apnea    patient states it was "marginal", no CPAP    Social History    Socioeconomic History   Marital status: Single    Spouse name: Not on file   Number of children: 0   Years of education: Not on file   Highest education level: Some college, no degree  Occupational History   Not on file  Tobacco Use   Smoking status: Never   Smokeless tobacco: Never  Vaping Use   Vaping Use: Never used  Substance and Sexual Activity   Alcohol use: Not Currently    Comment: rarely   Drug use: No   Sexual activity: Never    Birth control/protection: Abstinence  Other Topics Concern   Not on file  Social History Narrative   Not on file   Social Determinants of Health   Financial Resource Strain: Low Risk  (12/13/2021)   Overall Financial Resource Strain (CARDIA)    Difficulty of Paying Living Expenses: Not very hard  Food Insecurity: No Food Insecurity (12/13/2021)   Hunger Vital Sign    Worried About Running Out of Food in the Last Year: Never true    Ran Out  of Food in the Last Year: Never true  Transportation Needs: No Transportation Needs (12/13/2021)   PRAPARE - Hydrologist (Medical): No    Lack of Transportation (Non-Medical): No  Physical Activity: Insufficiently Active (12/13/2021)   Exercise Vital Sign    Days of Exercise per Week: 2 days    Minutes of Exercise per Session: 30 min  Stress: No Stress Concern Present (12/13/2021)   [place]    Feeling of Stress : Not at all  Social Connections: Moderately Isolated (12/13/2021)   Social Connection and Isolation Panel [NHANES]    Frequency of Communication with Friends and Family: More than three times a week    Frequency of Social Gatherings with Friends and Family: Twice a week    Attends Religious Services: More than 4 times per year    Active Member of [employer] or Organizations: No    Attends Archivist Meetings: Never    Marital Status: Never married  Intimate Partner Violence: Not At Risk  (12/13/2021)   Humiliation, Afraid, Rape, and Kick questionnaire    Fear of Current or Ex-Partner: No    Emotionally Abused: No    Physically Abused: No    Sexually Abused: No    Past Surgical History:  Procedure Laterality Date   COLONOSCOPY WITH PROPOFOL N/A 02/05/2016   Procedure: COLONOSCOPY WITH PROPOFOL;  Surgeon: [name], MD;  Location: [place];  Service: Endoscopy;  Laterality: N/A;   MOUTH SURGERY     orthodontic surgery for underbite   MYRINGOTOMY WITH TUBE PLACEMENT     NASAL SEPTUM SURGERY     NASAL SINUS SURGERY Bilateral 10/09/2020   POLYPECTOMY N/A 02/05/2016   Procedure: POLYPECTOMY;  Surgeon: [name], MD;  Location: [place];  Service: Endoscopy;  Laterality: N/A;    Family History  Problem Relation Age of Onset   Depression Mother    Anxiety disorder Mother    Congestive Heart Failure Father    Prostate cancer Father    Hypertension Father     Allergies  Allergen Reactions   Penicillins Hives and Rash   Sulfa Antibiotics Hives   Amoxicillin Hives       Latest Ref Rng & Units 04/25/2021    2:42 PM 08/05/2019    4:32 PM 11/17/2018    9:38 PM  CBC  WBC 3.4 - 10.8 x10E3/uL 6.5  7.7  9.2   Hemoglobin 13.0 - 17.7 g/dL 16.0  15.0  15.7   Hematocrit 37.5 - 51.0 % 46.2  43.1  43.8   Platelets 150 - 450 x10E3/uL 250  300  280       CMP     Component Value Date/Time   NA 140 05/14/2022 1624   K 3.8 05/14/2022 1624   CL 100 05/14/2022 1624   CO2 25 05/14/2022 1624   GLUCOSE 82 05/14/2022 1624   GLUCOSE 92 11/24/2018 0633   BUN 16 05/14/2022 1624   CREATININE 1.07 05/14/2022 1624   CALCIUM 9.2 05/14/2022 1624   PROT 6.7 05/14/2022 1624   ALBUMIN 4.5 05/14/2022 1624   AST 27 05/14/2022 1624   ALT 26 05/14/2022 1624   ALKPHOS 66 05/14/2022 1624   BILITOT 0.7 05/14/2022 1624   GFRNONAA 87 08/05/2019 1632   GFRAA 101 08/05/2019 1632     No results found.     Assessment & Plan:   1. Pain and swelling of right  lower leg  Recommend:  I have had a long discussion with the patient regarding swelling and why it  causes symptoms.  Patient will begin wearing graduated compression on a daily basis a prescription was given. The patient will  wear the stockings first thing in the morning and removing them in the evening. The patient is instructed specifically not to sleep in the stockings.   In addition, behavioral modification will be initiated.  This will include frequent elevation, use of over the counter pain medications and exercise such as walking.  Consideration for a lymph pump will also be made based upon the effectiveness of conservative therapy.  This would help to improve the edema control and prevent sequela such as ulcers and infections   Patient should undergo duplex ultrasound of the venous system to ensure that DVT or reflux is not present.  The patient will follow-up with me after the ultrasound.   2. Primary hypertension Continue antihypertensive medications as already ordered, these medications have been reviewed and there are no changes at this time.  3. Mixed hyperlipidemia Continue statin as ordered and reviewed, no changes at this time   Current Outpatient Medications on File Prior to Visit  Medication Sig Dispense Refill   Ascorbic Acid (VITAMIN C) 100 MG tablet Take 100 mg by mouth daily.     B Complex Vitamins (VITAMIN B COMPLEX) TABS      cetirizine-pseudoephedrine (ZYRTEC-D ALLERGY & CONGESTION) 5-120 MG tablet      Cholecalciferol (VITAMIN D3) 10 MCG (400 UNIT) CAPS      diphenoxylate-atropine (LOMOTIL) 2.5-0.025 MG tablet Take 2 tablets by mouth 4 (four) times daily as needed for diarrhea or loose stools. 240 tablet 5   hyoscyamine (ANASPAZ) 0.125 MG TBDP disintergrating tablet Place 1 tablet (0.125 mg total) under the tongue every 4 (four) hours as needed. 180 tablet 1   Multiple Vitamin (MULTIVITAMIN WITH MINERALS) TABS tablet Take 1 tablet by mouth daily.     omeprazole  (PRILOSEC) 20 MG capsule Take by mouth.     rosuvastatin (CRESTOR) 10 MG tablet Take 1 tablet (10 mg total) by mouth daily. 90 tablet 0   sildenafil (VIAGRA) 50 MG tablet Take 1 tablet (50 mg total) by mouth as needed for erectile dysfunction. 25 tablet 4   verapamil (CALAN-SR) 240 MG CR tablet TAKE 1 TABLET BY MOUTH DAILY 90 tablet 0   No current facility-administered medications on file prior to visit.    There are no Patient Instructions on file for this visit. No follow-ups on file.   Kris Hartmann, NP

## 2022-05-29 ENCOUNTER — Encounter (INDEPENDENT_AMBULATORY_CARE_PROVIDER_SITE_OTHER): Payer: Self-pay

## 2022-05-29 ENCOUNTER — Encounter (INDEPENDENT_AMBULATORY_CARE_PROVIDER_SITE_OTHER): Payer: Medicare Other

## 2022-05-30 DIAGNOSIS — K589 Irritable bowel syndrome without diarrhea: Secondary | ICD-10-CM | POA: Diagnosis not present

## 2022-05-30 DIAGNOSIS — K219 Gastro-esophageal reflux disease without esophagitis: Secondary | ICD-10-CM | POA: Diagnosis not present

## 2022-05-30 DIAGNOSIS — E7800 Pure hypercholesterolemia, unspecified: Secondary | ICD-10-CM | POA: Diagnosis not present

## 2022-05-30 DIAGNOSIS — R109 Unspecified abdominal pain: Secondary | ICD-10-CM | POA: Diagnosis not present

## 2022-05-30 DIAGNOSIS — R1110 Vomiting, unspecified: Secondary | ICD-10-CM | POA: Diagnosis not present

## 2022-05-30 DIAGNOSIS — R630 Anorexia: Secondary | ICD-10-CM | POA: Diagnosis not present

## 2022-05-30 DIAGNOSIS — I10 Essential (primary) hypertension: Secondary | ICD-10-CM | POA: Diagnosis not present

## 2022-05-30 DIAGNOSIS — E079 Disorder of thyroid, unspecified: Secondary | ICD-10-CM | POA: Diagnosis not present

## 2022-05-30 DIAGNOSIS — R140 Abdominal distension (gaseous): Secondary | ICD-10-CM | POA: Diagnosis not present

## 2022-05-30 DIAGNOSIS — R197 Diarrhea, unspecified: Secondary | ICD-10-CM | POA: Diagnosis not present

## 2022-06-13 ENCOUNTER — Ambulatory Visit (INDEPENDENT_AMBULATORY_CARE_PROVIDER_SITE_OTHER): Payer: Medicare Other | Admitting: Nurse Practitioner

## 2022-06-16 NOTE — Progress Notes (Signed)
I,Joseline E Rosas,acting as a scribe for [employer], MD.,have documented all relevant documentation on the behalf of [name], MD,as directed by  [name], MD while in the presence of [name], MD.   Established patient visit   Patient: William Coleman.   DOB: 1960-09-20   62 y.o. Male  MRN: [id number] Visit Date: 06/17/2022  Today's healthcare provider: [name], MD   Chief Complaint  Patient presents with   Follow-Up HTN   Subjective    HPI  Hypertension, follow-up  BP Readings from Last 3 Encounters:  06/17/22 (!) 128/90  05/16/22 (!) 143/85  05/14/22 (!) 160/86   Wt Readings from Last 3 Encounters:  06/17/22 167 lb 1.6 oz (75.8 kg)  05/16/22 165 lb (74.8 kg)  05/14/22 167 lb (75.8 kg)     He was last seen for hypertension 1 months ago.  BP at that visit was 143/85. Management since that visit includes Continue verapamil '240mg'$  daily .  He reports excellent compliance with treatment. He is not having side effects.   Outside blood pressures are not being checked. He reports that he has been taking blood pressure medications as prescribed nightly  He reports that he normally has mountain dew in the AM  And sometimes will have a '25mg'$  caffeine tablet  He has tea for dinner and sometimes decaf coffee in the morning or evening  He also adds that he has not been exercising per his normal routine   He reports having issues with lisinopril (cough) and amlodipine (swelling in legs) in the past for blood pressure     Symptoms: No chest pain No chest pressure  No palpitations No syncope  No dyspnea No orthopnea  No paroxysmal nocturnal dyspnea No lower extremity edema   Pertinent labs Lab Results  Component Value Date   CHOL 151 05/14/2022   HDL 47 05/14/2022   LDLCALC 78 05/14/2022   TRIG 148 05/14/2022   CHOLHDL 3.2 05/14/2022   Lab Results  Component Value Date   NA 140  05/14/2022   K 3.8 05/14/2022   CREATININE 1.07 05/14/2022   EGFR 79 05/14/2022   GLUCOSE 82 05/14/2022   TSH 0.730 05/14/2022     The 10-year ASCVD risk score (Arnett DK, et al., 2019) is: 15.1%  ---------------------------------------------------------------------------------------------------   Medications: Outpatient Medications Prior to Visit  Medication Sig   Ascorbic Acid (VITAMIN C) 100 MG tablet Take 100 mg by mouth daily.   B Complex Vitamins (VITAMIN B COMPLEX) TABS    Cholecalciferol (VITAMIN D3) 10 MCG (400 UNIT) CAPS    colestipol (COLESTID) 1 g tablet Take by mouth.   diphenoxylate-atropine (LOMOTIL) 2.5-0.025 MG tablet Take 2 tablets by mouth 4 (four) times daily as needed for diarrhea or loose stools.   hyoscyamine (ANASPAZ) 0.125 MG TBDP disintergrating tablet Place 1 tablet (0.125 mg total) under the tongue every 4 (four) hours as needed.   Multiple Vitamin (MULTIVITAMIN WITH MINERALS) TABS tablet Take 1 tablet by mouth daily.   omeprazole (PRILOSEC) 20 MG capsule Take by mouth.   rosuvastatin (CRESTOR) 10 MG tablet Take 1 tablet (10 mg total) by mouth daily.   sildenafil (VIAGRA) 50 MG tablet Take 1 tablet (50 mg total) by mouth as needed for erectile dysfunction.   verapamil (CALAN-SR) 240 MG CR tablet TAKE 1 TABLET BY MOUTH DAILY   [DISCONTINUED] cetirizine-pseudoephedrine (ZYRTEC-D ALLERGY & CONGESTION) 5-120 MG tablet    No facility-administered medications prior to visit.  Review of Systems     Objective    BP (!) 128/90   Pulse 81   Temp 98.7 F (37.1 C) (Oral)   Resp 16   Wt 167 lb 1.6 oz (75.8 kg)   BMI 26.17 kg/m    Physical Exam Vitals reviewed.  Constitutional:      General: He is not in acute distress.    Appearance: Normal appearance. He is not ill-appearing, toxic-appearing or diaphoretic.  Eyes:     Conjunctiva/sclera: Conjunctivae normal.  Cardiovascular:     Rate and Rhythm: Normal rate and regular rhythm.     Pulses:  Normal pulses.     Heart sounds: Normal heart sounds. No murmur heard.    No friction rub. No gallop.  Pulmonary:     Effort: Pulmonary effort is normal. No respiratory distress.     Breath sounds: Normal breath sounds. No stridor. No wheezing, rhonchi or rales.  Abdominal:     General: Bowel sounds are normal. There is no distension.     Palpations: Abdomen is soft.     Tenderness: There is no abdominal tenderness.  Musculoskeletal:     Right lower leg: No edema.     Left lower leg: No edema.  Skin:    Findings: No erythema or rash.  Neurological:     Mental Status: He is alert and oriented to person, place, and time.      No results found for any visits on 06/17/22.  Assessment & Plan     Problem List Items Addressed This Visit       Cardiovascular and Mediastinum   HTN (hypertension) - Primary    BP elevated in office  Symptomatic  Reports regular adherence to BP regimen with verapamil  Previously on amlodipine but discontinued due to leg swelling, hctz discontinued due to hypokalemia and losartan as well as lisinopril  Will add hydralazine '10mg'$  BID and have patient monitor BP at home  Follow up in 2 weeks       Relevant Medications   hydrALAZINE (APRESOLINE) 10 MG tablet   colestipol (COLESTID) 1 g tablet     Other   Family hx of prostate cancer    PSA ordered Denies symptoms today        Relevant Orders   PSA, total and free     Return in about 2 weeks (around 07/01/2022) for HTN med change .      I, [name], MD, have reviewed all documentation for this visit.  Portions of this information were initially documented by the CMA and reviewed by me for thoroughness and accuracy.      [name], MD  [facility] [phone] (phone) [phone] (fax)  [place]

## 2022-06-17 ENCOUNTER — Encounter: Payer: Self-pay | Admitting: Family Medicine

## 2022-06-17 ENCOUNTER — Ambulatory Visit (INDEPENDENT_AMBULATORY_CARE_PROVIDER_SITE_OTHER): Payer: Medicare Other | Admitting: Family Medicine

## 2022-06-17 VITALS — BP 128/90 | HR 81 | Temp 98.7°F | Resp 16 | Wt 167.1 lb

## 2022-06-17 DIAGNOSIS — I10 Essential (primary) hypertension: Secondary | ICD-10-CM

## 2022-06-17 DIAGNOSIS — Z8042 Family history of malignant neoplasm of prostate: Secondary | ICD-10-CM | POA: Diagnosis not present

## 2022-06-17 MED ORDER — HYDRALAZINE HCL 10 MG PO TABS: 10.0000 mg | ORAL_TABLET | Freq: Two times a day (BID) | ORAL | 2 refills | Status: DC

## 2022-06-17 NOTE — Patient Instructions (Addendum)
Please start taking the hydralazine '10mg'$  twice daily (once in AM and once at bedtime)   Please continue to check your blood pressure 1-2 hours after taking the AM dose of hydralazine   Please follow up with me in 2-3 weeks for blood pressure recheck

## 2022-06-17 NOTE — Assessment & Plan Note (Signed)
BP elevated in office  Symptomatic  Reports regular adherence to BP regimen with verapamil  Previously on amlodipine but discontinued due to leg swelling, hctz discontinued due to hypokalemia and losartan as well as lisinopril  Will add hydralazine '10mg'$  BID and have patient monitor BP at home  Follow up in 2 weeks

## 2022-06-17 NOTE — Assessment & Plan Note (Addendum)
PSA ordered Denies symptoms today

## 2022-06-18 ENCOUNTER — Ambulatory Visit: Payer: Self-pay | Admitting: *Deleted

## 2022-06-18 LAB — PSA, TOTAL AND FREE
PSA, Free Pct: 29.5 %
PSA, Free: 0.56 ng/mL
Prostate Specific Ag, Serum: 1.9 ng/mL (ref 0.0–4.0)

## 2022-06-18 NOTE — Telephone Encounter (Signed)
Reason for Disposition . [1] Follow-up call to recent contact AND [2] information only call, no triage required  Answer Assessment - Initial Assessment Questions 1. REASON FOR CALL or QUESTION: "What is your reason for calling today?" or "How can I best help you?" or "What question do you have that I can help answer?"     Pt returned call and was given his PSA result from Dr. Alba Cory dated 06/18/2022 at 9:49 AM.  No questions from pt.  Protocols used: Information Only Call - No Triage-A-AH

## 2022-07-02 ENCOUNTER — Encounter: Payer: Self-pay | Admitting: Family Medicine

## 2022-07-02 ENCOUNTER — Ambulatory Visit (INDEPENDENT_AMBULATORY_CARE_PROVIDER_SITE_OTHER): Payer: Medicare Other | Admitting: Family Medicine

## 2022-07-02 VITALS — BP 159/75 | HR 77 | Temp 98.0°F | Resp 16 | Wt 167.0 lb

## 2022-07-02 DIAGNOSIS — I10 Essential (primary) hypertension: Secondary | ICD-10-CM | POA: Diagnosis not present

## 2022-07-02 MED ORDER — HYDRALAZINE HCL 25 MG PO TABS: 25.0000 mg | ORAL_TABLET | Freq: Three times a day (TID) | ORAL | 1 refills | Status: DC

## 2022-07-02 NOTE — Progress Notes (Signed)
I,Joseline E Rosas,acting as a scribe for [employer], MD.,have documented all relevant documentation on the behalf of [name], MD,as directed by  [name], MD while in the presence of [name], MD.  Established patient visit   Patient: William Coleman.   DOB: 1961-06-01   62 y.o. Male  MRN: [id number] Visit Date: 07/02/2022  Today's healthcare provider: [name], MD   Chief Complaint  Patient presents with   follow-up BP   Subjective    HPI   HTN Follow Up  Patient presents for follow up for HTN  At last visit, BP was elevated and measuring 128/90. Treatment plan at that time included adding hydralazine '10mg'$  twice daily to verapamil '240mg'$   BP ranges since last visit not being checked States that he does have monitor to check but has been very busy and has not been able to check    BP Readings from Last 3 Encounters:  07/02/22 (!) 159/75  06/17/22 (!) 128/90  05/16/22 (!) 143/85     Medications: Outpatient Medications Prior to Visit  Medication Sig   Ascorbic Acid (VITAMIN C) 100 MG tablet Take 100 mg by mouth daily.   B Complex Vitamins (VITAMIN B COMPLEX) TABS    Cholecalciferol (VITAMIN D3) 10 MCG (400 UNIT) CAPS    colestipol (COLESTID) 1 g tablet Take by mouth.   diphenoxylate-atropine (LOMOTIL) 2.5-0.025 MG tablet Take 2 tablets by mouth 4 (four) times daily as needed for diarrhea or loose stools.   hyoscyamine (ANASPAZ) 0.125 MG TBDP disintergrating tablet Place 1 tablet (0.125 mg total) under the tongue every 4 (four) hours as needed.   Multiple Vitamin (MULTIVITAMIN WITH MINERALS) TABS tablet Take 1 tablet by mouth daily.   omeprazole (PRILOSEC) 20 MG capsule Take by mouth.   rosuvastatin (CRESTOR) 10 MG tablet Take 1 tablet (10 mg total) by mouth daily.   sildenafil (VIAGRA) 50 MG tablet Take 1 tablet (50 mg total) by mouth as needed for erectile dysfunction.   verapamil  (CALAN-SR) 240 MG CR tablet TAKE 1 TABLET BY MOUTH DAILY   [DISCONTINUED] hydrALAZINE (APRESOLINE) 10 MG tablet Take 1 tablet (10 mg total) by mouth 2 (two) times daily.   No facility-administered medications prior to visit.    Review of Systems     Objective    BP (!) 159/75 (BP Location: Left Arm, Patient Position: Sitting, Cuff Size: Normal)   Pulse 77   Temp 98 F (36.7 C) (Oral)   Resp 16   Wt 167 lb (75.8 kg)   BMI 26.16 kg/m    Physical Exam Vitals reviewed.  Constitutional:      General: He is not in acute distress.    Appearance: Normal appearance. He is not ill-appearing, toxic-appearing or diaphoretic.  Eyes:     Conjunctiva/sclera: Conjunctivae normal.  Cardiovascular:     Rate and Rhythm: Normal rate and regular rhythm.     Pulses: Normal pulses.     Heart sounds: Normal heart sounds. No murmur heard.    No friction rub. No gallop.  Pulmonary:     Effort: Pulmonary effort is normal. No respiratory distress.     Breath sounds: Normal breath sounds. No stridor. No wheezing, rhonchi or rales.  Abdominal:     General: Bowel sounds are normal. There is no distension.     Palpations: Abdomen is soft.     Tenderness: There is no abdominal tenderness.  Musculoskeletal:     Right lower leg: Edema present.  Left lower leg: No edema.     Comments: Chronic RLE edema near ankle  Skin:    Findings: No erythema or rash.  Neurological:     Mental Status: He is alert and oriented to person, place, and time.       No results found for any visits on 07/02/22.  Assessment & Plan     Problem List Items Addressed This Visit       Cardiovascular and Mediastinum   HTN (hypertension) - Primary    BP uncontrolled  No home BP measurements available to compare  Persistently elevated in office  Will increase hydralazine to 25 mg three times daily He will continue verapamil '240mg'$  daily  Follow up in 2 weeks for BP check  Counseled to check BP 1-2 hours after  medications and keep record for next visit       Relevant Medications   hydrALAZINE (APRESOLINE) 25 MG tablet     Return in about 2 weeks (around 07/16/2022) for HTN .        The entirety of the information documented in the History of Present Illness, Review of Systems and Physical Exam were personally obtained by me. Portions of this information were initially documented by Lyndel Pleasure, CMA  and reviewed by me for thoroughness and accuracy.[name], MD     [name], MD  [facility] [phone] (phone) [phone] (fax)  [place]

## 2022-07-02 NOTE — Patient Instructions (Addendum)
Please start taking the increased dose of the hydralazine '25mg'$  daily in addition to the verapamil '240mg'$  daily    Please start taking your blood pressure 1-2 hours after taking your blood pressure medication.   Please follow up with me in 2 weeks for blood pressure check  [facility]  Tips to measure your blood pressure correctly  To determine whether you have hypertension, a medical professional will take a blood pressure reading. How you prepare for the test, the position of your arm, and other factors can change a blood pressure reading by 10% or more. That could be enough to hide high blood pressure, start you on a drug you don't really need, or lead your doctor to incorrectly adjust your medications. National and international guidelines offer specific instructions for measuring blood pressure. If a doctor, nurse, or medical assistant isn't doing it right, don't hesitate to ask him or her to get with the guidelines. Here's what you can do to ensure a correct reading:  Don't drink a caffeinated beverage or smoke during the 30 minutes before the test.  Sit quietly for five minutes before the test begins.  During the measurement, sit in a chair with your feet on the floor and your arm supported so your elbow is at about heart level.  The inflatable part of the cuff should completely cover at least 80% of your upper arm, and the cuff should be placed on bare skin, not over a shirt.  Don't talk during the measurement.  Have your blood pressure measured twice, with a brief break in between. If the readings are different by 5 points or more, have it done a third time. There are times to break these rules. If you sometimes feel lightheaded when getting out of bed in the morning or when you stand after sitting, you should have your blood pressure checked while seated and then while standing to see if it falls from one position to the next. Because blood pressure varies throughout the day, your doctor  will rarely diagnose hypertension on the basis of a single reading. Instead, he or she will want to confirm the measurements on at least two occasions, usually within a few weeks of one another. The exception to this rule is if you have a blood pressure reading of 180/110 mm Hg or higher. A result this high usually calls for prompt treatment. It's also a good idea to have your blood pressure measured in both arms at least once, since the reading in one arm (usually the right) may be higher than that in the left. A 2014 study in The American Journal of Medicine of nearly 3,400 people found average arm- to-arm differences in systolic blood pressure of about 5 points. The higher number should be used to make treatment decisions. In 2017, new guidelines from the [employer], the [employer] of Cardiology, and nine other health organizations lowered the diagnosis of high blood pressure to 130/80 mm Hg or higher for all adults. The guidelines also redefined the various blood pressure categories to now include normal, elevated, Stage 1 hypertension, Stage 2 hypertension, and hypertensive crisis (see "Blood pressure categories"). Blood pressure categories  Blood pressure category SYSTOLIC (upper number)  DIASTOLIC (lower number)  Normal Less than 120 mm Hg and Less than 80 mm Hg  Elevated 120-129 mm Hg and Less than 80 mm Hg  High blood pressure: Stage 1 hypertension 130-139 mm Hg or 80-89 mm Hg  High blood pressure: Stage 2 hypertension 140  mm Hg or higher or 90 mm Hg or higher  Hypertensive crisis (consult your doctor immediately) Higher than 180 mm Hg and/or Higher than 120 mm Hg  Source: American Heart Association and American Stroke Association. For more on getting your blood pressure under control, buy Controlling Your Blood Pressure, a Special Health Report from [facility].

## 2022-07-02 NOTE — Assessment & Plan Note (Signed)
BP uncontrolled  No home BP measurements available to compare  Persistently elevated in office  Will increase hydralazine to 25 mg three times daily He will continue verapamil '240mg'$  daily  Follow up in 2 weeks for BP check  Counseled to check BP 1-2 hours after medications and keep record for next visit

## 2022-07-04 ENCOUNTER — Ambulatory Visit: Payer: Self-pay | Admitting: *Deleted

## 2022-07-04 NOTE — Telephone Encounter (Signed)
Patient advised.

## 2022-07-04 NOTE — Telephone Encounter (Signed)
Summary: Per agent:" FU with pt clarify script correct/ Dosage and times taken   Disp Refills Start End hydrALAZINE (APRESOLINE) 25 MG tablet 60 tablet 1 07/02/2022 Sig - Route: Take 1 tablet (25 mg total) by mouth 3 (three) times daily. - Oral  Pt was just given this med and wanted to make sure this dosage is correct. He is aware at appt dosage was changed but he was taking 2 times daily at 10 mg. It is upping the dosage to three times daily at and more than double each dosage? Pls advise pt [phone]. A voice message may be left, just needs confirmation that would be written correctly".           Chief Complaint: Med Question Symptoms: NA Frequency: NA Pertinent Negatives: Patient denies NA Disposition: '[]'$ ED /'[]'$ Urgent Care (no appt availability in office) / '[]'$ Appointment(In office/virtual)/ '[]'$  [place] [facility]/ '[]'$ Home Care/ '[]'$ Refused Recommended Disposition /'[]'$ [facility]/ '[x]'$  Follow-up with PCP Additional Notes: Pt states just wants to ensure he is to take the increased dose of Hydralazine ('10mg'$  2x daily to '25mg'$  3x daily) as noted. Advised noted in [place] but would route to PCP for confirmation. Please advise. States may leave detailed message on VM.   Reason for Disposition  [1] Caller has NON-URGENT medicine question about med that PCP prescribed AND [2] triager unable to answer question  Answer Assessment - Initial Assessment Questions 1. NAME of MEDICINE: "What medicine(s) are you calling about?"     Hydralazine 2. QUESTION: "What is your question?" (e.g., double dose of medicine, side effect)     IS dosage correct?  3. PRESCRIBER: "Who prescribed the medicine?" Reason: if prescribed by specialist, call should be referred to that group.     PCP 4. SYMPTOMS: "Do you have any symptoms?" If Yes, ask: "What symptoms are you having?"  "How bad are the symptoms (e.g., mild, moderate, severe)     No  Protocols used: Medication Question Call-A-AH

## 2022-07-04 NOTE — Telephone Encounter (Signed)
Patient should take hydralazine '25mg'$  twice daily for now, we can discuss if medication needs to be taken 3 times daily at next follow up appt.   [name], MD  [facility]

## 2022-07-14 ENCOUNTER — Encounter: Payer: Self-pay | Admitting: Family Medicine

## 2022-07-15 NOTE — Progress Notes (Unsigned)
I,Joseline E Rosas,acting as a scribe for [employer], MD.,have documented all relevant documentation on the behalf of [name], MD,as directed by  [name], MD while in the presence of [name], MD.   Established patient visit   Patient: William Coleman.   DOB: 1961/02/20   62 y.o. Male  MRN: [id number] Visit Date: 07/16/2022  Today's healthcare provider: [name], MD   Chief Complaint  Patient presents with   HTN follow-up   Subjective    HPI  Hypertension, follow-up  BP Readings from Last 3 Encounters:  07/16/22 (!) 148/74  07/02/22 (!) 159/75  06/17/22 (!) 128/90   Wt Readings from Last 3 Encounters:  07/16/22 169 lb 3.2 oz (76.7 kg)  07/02/22 167 lb (75.8 kg)  06/17/22 167 lb 1.6 oz (75.8 kg)     He was last seen for hypertension 2 weeks ago.  BP at that visit was 159/75. Management since that visit includes increase hydralazine to 25 mg three times daily He will continue verapamil '240mg'$  daily.  Per patient on Friday he had to stop the hydrALAZINE 25 mg 2 X per day. It had been causing to have really aggravating loose bowel issues.  Outside blood pressures are not being checked. Symptoms:Diarrhea No chest pain No chest pressure  No palpitations No syncope  No dyspnea No orthopnea  No paroxysmal nocturnal dyspnea No lower extremity edema   Pertinent labs Lab Results  Component Value Date   CHOL 151 05/14/2022   HDL 47 05/14/2022   LDLCALC 78 05/14/2022   TRIG 148 05/14/2022   CHOLHDL 3.2 05/14/2022   Lab Results  Component Value Date   NA 140 05/14/2022   K 3.8 05/14/2022   CREATININE 1.07 05/14/2022   EGFR 79 05/14/2022   GLUCOSE 82 05/14/2022   TSH 0.730 05/14/2022     The 10-year ASCVD risk score (Arnett DK, et al., 2019) is: 19.2%  Cough Patient reports that he has had chronic intermittent cough related to ongoing sinus issues States that he has been  categorizes disabled due to his issues with his sinuses and GI system States that he has postnasal drip that will often cause him to cough He reports that cough is mostly nonproductive but sometimes is productive of mucus Denies hemoptysis He denies current symptoms of shortness of breath, lightheadedness or dizziness Patient denies chest pain   ---------------------------------------------------------------------------------------------------   Medications: Outpatient Medications Prior to Visit  Medication Sig   Ascorbic Acid (VITAMIN C) 100 MG tablet Take 100 mg by mouth daily.   B Complex Vitamins (VITAMIN B COMPLEX) TABS    Cholecalciferol (VITAMIN D3) 10 MCG (400 UNIT) CAPS    colestipol (COLESTID) 1 g tablet Take by mouth.   diphenoxylate-atropine (LOMOTIL) 2.5-0.025 MG tablet Take 2 tablets by mouth 4 (four) times daily as needed for diarrhea or loose stools.   hyoscyamine (ANASPAZ) 0.125 MG TBDP disintergrating tablet Place 1 tablet (0.125 mg total) under the tongue every 4 (four) hours as needed.   Multiple Vitamin (MULTIVITAMIN WITH MINERALS) TABS tablet Take 1 tablet by mouth daily.   omeprazole (PRILOSEC) 20 MG capsule Take by mouth.   rosuvastatin (CRESTOR) 10 MG tablet Take 1 tablet (10 mg total) by mouth daily.   sildenafil (VIAGRA) 50 MG tablet Take 1 tablet (50 mg total) by mouth as needed for erectile dysfunction.   [DISCONTINUED] hydrALAZINE (APRESOLINE) 25 MG tablet Take 1 tablet (25 mg total) by mouth 3 (three) times daily.   [  DISCONTINUED] verapamil (CALAN-SR) 240 MG CR tablet TAKE 1 TABLET BY MOUTH DAILY   No facility-administered medications prior to visit.    Review of Systems     Objective    BP (!) 148/74 (BP Location: Right Arm, Patient Position: Sitting, Cuff Size: Large)   Pulse 70   Resp 16   Wt 169 lb 3.2 oz (76.7 kg)   SpO2 95%   BMI 26.50 kg/m    Physical Exam Vitals reviewed.  Constitutional:      General: He is not in acute distress.     Appearance: Normal appearance. He is not ill-appearing, toxic-appearing or diaphoretic.  Eyes:     Conjunctiva/sclera: Conjunctivae normal.  Cardiovascular:     Rate and Rhythm: Normal rate and regular rhythm.     Pulses: Normal pulses.     Heart sounds: Normal heart sounds. No murmur heard.    No friction rub. No gallop.  Pulmonary:     Effort: Pulmonary effort is normal. No tachypnea, bradypnea, accessory muscle usage, respiratory distress or retractions.     Breath sounds: No stridor. Examination of the right-upper field reveals rhonchi. Examination of the left-upper field reveals rhonchi. Examination of the right-middle field reveals rhonchi. Examination of the left-middle field reveals rhonchi. Examination of the right-lower field reveals rhonchi. Examination of the left-lower field reveals rhonchi. Rhonchi present. No decreased breath sounds, wheezing or rales.     Comments: Patient wearing N95 mask throughout exam Abdominal:     General: Bowel sounds are normal. There is no distension.     Palpations: Abdomen is soft.     Tenderness: There is no abdominal tenderness.  Musculoskeletal:     Right lower leg: No edema.     Left lower leg: No edema.  Skin:    Findings: No erythema or rash.  Neurological:     Mental Status: He is alert and oriented to person, place, and time.       No results found for any visits on 07/16/22.  Assessment & Plan     Problem List Items Addressed This Visit       Cardiovascular and Mediastinum   HTN (hypertension) - Primary    BP improved but not yet at goal  Chronic Will increase verapamil from 240 mg daily to 360 mg daily Discontinue hydralazine due to patient's side effects Patient will follow-up in 1 month        Relevant Medications   verapamil (VERELAN PM) 360 MG 24 hr capsule     Other   Subacute cough    Patient reports long history of chronic issues with his sinuses and postnasal drip causing intermittent cough Has had  cough on multiple past visits for hypertension Patient has coarse breath sounds on exam today in setting of 95% oxygen saturation Recommended chest x-ray to evaluate for any signs of pneumonia or structural causes that could be related to the cough Patient was agreeable Chest x-ray, 2 view ordered Will follow-up pending results      Relevant Orders   DG Chest 2 View     Return in about 4 weeks (around 08/13/2022) for HTN .        The entirety of the information documented in the History of Present Illness, Review of Systems and Physical Exam were personally obtained by me. Portions of this information were initially documented by Lyndel Pleasure, CMA and reviewed by me for thoroughness and accuracy.[name], MD     [name], MD  [facility]  [place] [phone] (phone) [phone] (fax)  [name]

## 2022-07-16 ENCOUNTER — Encounter: Payer: Self-pay | Admitting: Family Medicine

## 2022-07-16 ENCOUNTER — Ambulatory Visit (INDEPENDENT_AMBULATORY_CARE_PROVIDER_SITE_OTHER): Payer: Medicare Other | Admitting: Family Medicine

## 2022-07-16 VITALS — BP 148/74 | HR 70 | Resp 16 | Wt 169.2 lb

## 2022-07-16 DIAGNOSIS — R052 Subacute cough: Secondary | ICD-10-CM

## 2022-07-16 DIAGNOSIS — I10 Essential (primary) hypertension: Secondary | ICD-10-CM | POA: Diagnosis not present

## 2022-07-16 MED ORDER — VERAPAMIL HCL ER 360 MG PO CP24: 360.0000 mg | ORAL_CAPSULE | Freq: Every day | ORAL | 1 refills | Status: DC

## 2022-07-16 NOTE — Assessment & Plan Note (Addendum)
BP improved but not yet at goal  Chronic Will increase verapamil from 240 mg daily to 360 mg daily Discontinue hydralazine due to patient's side effects Patient will follow-up in 1 month

## 2022-07-16 NOTE — Patient Instructions (Signed)
For your blood pressure, we will increase your verapamil to 360 mg daily.  Please check your blood pressure at home 1 to 2 hours after taking your blood pressure medication until the next visit.  We we will investigate your cough about a further by completing the chest x-ray.  Please report to [facility] located at:  [place], [place]  You do not need an appointment to have xrays completed.   Our office will follow up with  results once available.  Please follow-up with me in 1 month for blood pressure

## 2022-07-16 NOTE — Assessment & Plan Note (Signed)
Patient reports long history of chronic issues with his sinuses and postnasal drip causing intermittent cough Has had cough on multiple past visits for hypertension Patient has coarse breath sounds on exam today in setting of 95% oxygen saturation Recommended chest x-ray to evaluate for any signs of pneumonia or structural causes that could be related to the cough Patient was agreeable Chest x-ray, 2 view ordered Will follow-up pending results

## 2022-07-22 ENCOUNTER — Ambulatory Visit
Admission: RE | Admit: 2022-07-22 | Discharge: 2022-07-22 | Disposition: A | Discharge status: Home / Self Care | Payer: Medicare Other | Attending: Family Medicine | Admitting: Family Medicine

## 2022-07-22 ENCOUNTER — Ambulatory Visit
Admission: RE | Admit: 2022-07-22 | Discharge: 2022-07-22 | Disposition: A | Discharge status: Home / Self Care | Payer: Medicare Other | Source: Ambulatory Visit | Attending: Family Medicine | Admitting: Family Medicine

## 2022-07-22 DIAGNOSIS — R052 Subacute cough: Secondary | ICD-10-CM | POA: Insufficient documentation

## 2022-07-26 ENCOUNTER — Other Ambulatory Visit: Payer: Self-pay | Admitting: Family Medicine

## 2022-07-26 DIAGNOSIS — E782 Mixed hyperlipidemia: Secondary | ICD-10-CM

## 2022-08-14 NOTE — Progress Notes (Unsigned)
I,William Coleman,acting as a scribe for [employer], MD.,have documented all relevant documentation on the behalf of William Foster, MD,as directed by  William Foster, MD while in the presence of William Foster, MD.   Established patient visit   Patient: William Coleman.   DOB: [birth year]   62 y.o. Male  MRN: [id number] Visit Date: 08/15/2022  Today's healthcare provider: [name], MD   Chief Complaint  Patient presents with   Follow-up HTN   Subjective    HPI   Health Maintenance Declined shingles vaccine. Declines additional COVID booster, last dose 04/11/2022  Hypertension, follow-up  BP Readings from Last 3 Encounters:  08/15/22 138/85  07/16/22 (!) 148/74  07/02/22 (!) 159/75   Wt Readings from Last 3 Encounters:  08/15/22 166 lb (75.3 kg)  07/16/22 169 lb 3.2 oz (76.7 kg)  07/02/22 167 lb (75.8 kg)     He was last seen for hypertension 1 months ago.  BP at that visit was 148/74. Management since that visit includes increase verapamil from 240 mg daily to 360 mg daily Discontinued hydralazine due to patient's side effects.  He reports excellent compliance with treatment. He is not having side effects.    Outside blood pressures are not being checked. Symptoms: No chest pain No chest pressure  No palpitations No syncope  No dyspnea No orthopnea  No paroxysmal nocturnal dyspnea Yes lower extremity edema-Right ankle   Pertinent labs Lab Results  Component Value Date   CHOL 151 05/14/2022   HDL 47 05/14/2022   LDLCALC 78 05/14/2022   TRIG 148 05/14/2022   CHOLHDL 3.2 05/14/2022   Lab Results  Component Value Date   NA 140 05/14/2022   K 3.8 05/14/2022   CREATININE 1.07 05/14/2022   EGFR 79 05/14/2022   GLUCOSE 82 05/14/2022   TSH 0.730 05/14/2022     The 10-year ASCVD risk score (Arnett DK, et al., 2019) is:  17.1%  ---------------------------------------------------------------------------------------------------   Medications: Outpatient Medications Prior to Visit  Medication Sig   Ascorbic Acid (VITAMIN C) 100 MG tablet Take 100 mg by mouth daily.   B Complex Vitamins (VITAMIN B COMPLEX) TABS    Cholecalciferol (VITAMIN D3) 10 MCG (400 UNIT) CAPS    colestipol (COLESTID) 1 g tablet Take by mouth.   diphenoxylate-atropine (LOMOTIL) 2.5-0.025 MG tablet Take 2 tablets by mouth 4 (four) times daily as needed for diarrhea or loose stools.   hyoscyamine (ANASPAZ) 0.125 MG TBDP disintergrating tablet Place 1 tablet (0.125 mg total) under the tongue every 4 (four) hours as needed.   Multiple Vitamin (MULTIVITAMIN WITH MINERALS) TABS tablet Take 1 tablet by mouth daily.   omeprazole (PRILOSEC) 20 MG capsule Take by mouth.   rosuvastatin (CRESTOR) 10 MG tablet TAKE 1 TABLET BY MOUTH DAILY   sildenafil (VIAGRA) 50 MG tablet Take 1 tablet (50 mg total) by mouth as needed for erectile dysfunction.   verapamil (VERELAN PM) 360 MG 24 hr capsule Take 1 capsule (360 mg total) by mouth at bedtime.   No facility-administered medications prior to visit.    Review of Systems     Objective    BP 138/85 (BP Location: Left Arm, Patient Position: Sitting, Cuff Size: Normal)   Pulse 65   Resp 16   Wt 166 lb (75.3 kg)   BMI 26.00 kg/m    Physical Exam Vitals reviewed.  Constitutional:      General: He is not in acute distress.    Appearance: Normal  appearance. He is not ill-appearing, toxic-appearing or diaphoretic.  Eyes:     Conjunctiva/sclera: Conjunctivae normal.  Cardiovascular:     Rate and Rhythm: Normal rate and regular rhythm.     Pulses: Normal pulses.     Heart sounds: Normal heart sounds. No murmur heard.    No friction rub. No gallop.  Pulmonary:     Effort: Pulmonary effort is normal. No respiratory distress.     Breath sounds: Normal breath sounds. No stridor. No wheezing, rhonchi  or rales.  Abdominal:     General: Bowel sounds are normal. There is no distension.     Palpations: Abdomen is soft.     Tenderness: There is no abdominal tenderness.  Musculoskeletal:     Right lower leg: No edema.     Left lower leg: No edema.  Skin:    Findings: No erythema or rash.  Neurological:     Mental Status: He is alert.       No results found for any visits on 08/15/22.  Assessment & Plan     Problem List Items Addressed This Visit       Cardiovascular and Mediastinum   HTN (hypertension) - Primary    Controlled BP at goal today Continue verapamil '360mg'$  daily   No medications changes today          Other   Healthcare maintenance    Patient declines Shingrix vaccine Patient states that he is up-to-date on COVID vaccines        Return in about 4 months (around 12/15/2022) for HTN.        The entirety of the information documented in the History of Present Illness, Review of Systems and Physical Exam were personally obtained by me. Portions of this information were initially documented by Lyndel Pleasure, CMA . I, William Foster, MD have reviewed the documentation above for thoroughness and accuracy.      William Foster, MD  [facility] [phone] (phone) [phone] (fax)  [place]

## 2022-08-15 ENCOUNTER — Ambulatory Visit (INDEPENDENT_AMBULATORY_CARE_PROVIDER_SITE_OTHER): Payer: Medicare Other | Admitting: Family Medicine

## 2022-08-15 ENCOUNTER — Encounter: Payer: Self-pay | Admitting: Family Medicine

## 2022-08-15 VITALS — BP 138/85 | HR 65 | Resp 16 | Wt 166.0 lb

## 2022-08-15 DIAGNOSIS — Z0000 Encounter for general adult medical examination without abnormal findings: Secondary | ICD-10-CM

## 2022-08-15 DIAGNOSIS — I10 Essential (primary) hypertension: Secondary | ICD-10-CM | POA: Diagnosis not present

## 2022-08-15 NOTE — Assessment & Plan Note (Signed)
Controlled BP at goal today Continue verapamil '360mg'$  daily   No medications changes today

## 2022-08-15 NOTE — Assessment & Plan Note (Signed)
Patient declines Shingrix vaccine Patient states that he is up-to-date on COVID vaccines

## 2022-11-28 DIAGNOSIS — K2270 Barrett's esophagus without dysplasia: Secondary | ICD-10-CM | POA: Diagnosis not present

## 2022-11-28 DIAGNOSIS — R197 Diarrhea, unspecified: Secondary | ICD-10-CM | POA: Diagnosis not present

## 2022-12-09 ENCOUNTER — Ambulatory Visit
Admission: EM | Admit: 2022-12-09 | Discharge: 2022-12-09 | Disposition: A | Discharge status: Home / Self Care | Payer: Medicare Other | Attending: Urgent Care | Admitting: Urgent Care

## 2022-12-09 DIAGNOSIS — J019 Acute sinusitis, unspecified: Secondary | ICD-10-CM

## 2022-12-09 MED ORDER — PREDNISONE 50 MG PO TABS: 50.0000 mg | ORAL_TABLET | Freq: Every day | ORAL | 0 refills | Status: AC

## 2022-12-09 NOTE — ED Triage Notes (Signed)
Patient presents to UC for cough and nasal congestion x 1 week. Taking zyrtec with no relief.

## 2022-12-09 NOTE — ED Provider Notes (Signed)
Renaldo Fiddler    CSN: [id number] Arrival date & time: 12/09/22  1131      History   Chief Complaint Chief Complaint  Patient presents with   Nasal Congestion   Cough    HPI Dessie Coma Taja Gabel. is a 62 y.o. male.    Cough   Presents to urgent care with 1+ week history of sinus drainage and cough.  He endorses chronic sinus symptoms and is managed by ENT.  He reports productive cough with white-colored sputum.  Denies green or yellow discharge.  Nuys fever.  Past Medical History:  Diagnosis Date   Anancastic neurosis    Anginal pain (HCC)    Anxiety, generalized    Bipolar disorder (HCC)    Chronic diarrhea    Depression    Hypertension    Hypothyroidism    Laryngopharyngeal reflux (LPR) 01/01/2018   Obsessive compulsive disorder    OCD (obsessive compulsive disorder)    Sleep apnea    patient states it was "marginal", no CPAP    Patient Active Problem List   Diagnosis Date Noted   Healthcare maintenance 08/15/2022   Subacute cough 07/16/2022   Family hx of prostate cancer 06/17/2022   Overweight with body mass index (BMI) of 26 to 26.9 in adult 05/14/2022   PND (post-nasal drip) 10/17/2021   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   Chronic nasal congestion 12/26/2020   Sinusitis 06/20/2020   Laryngopharyngeal reflux (LPR) 01/01/2018   Deviated nasal septum 01/01/2018   Non-seasonal allergic rhinitis 01/01/2018   Incontinence of feces 11/10/2017   OCD (obsessive compulsive disorder) 01/01/2017   HLD (hyperlipidemia) 11/10/2014   HTN (hypertension) 11/10/2014   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/10/2014    Past Surgical History:  Procedure Laterality Date   COLONOSCOPY WITH PROPOFOL N/A 02/05/2016   Procedure: COLONOSCOPY WITH PROPOFOL;  Surgeon: Midge Minium, MD;  Location: [facility];  Service: Endoscopy;  Laterality: N/A;   MOUTH SURGERY     orthodontic surgery for underbite   MYRINGOTOMY  WITH TUBE PLACEMENT     NASAL SEPTUM SURGERY     NASAL SINUS SURGERY Bilateral 10/09/2020   POLYPECTOMY N/A 02/05/2016   Procedure: POLYPECTOMY;  Surgeon: Midge Minium, MD;  Location: [facility];  Service: Endoscopy;  Laterality: N/A;       Home Medications    Prior to Admission medications   Medication Sig Start Date End Date Taking? Authorizing Provider  Ascorbic Acid (VITAMIN C) 100 MG tablet Take 100 mg by mouth daily.    [provider]  B Complex Vitamins (VITAMIN B COMPLEX) TABS     [provider]  Cholecalciferol (VITAMIN D3) 10 MCG (400 UNIT) CAPS     [provider]  colestipol (COLESTID) 1 g tablet Take by mouth. 05/28/22 11/24/22  [provider]  diphenoxylate-atropine (LOMOTIL) 2.5-0.025 MG tablet Take 2 tablets by mouth 4 (four) times daily as needed for diarrhea or loose stools. 07/30/18   Midge Minium, MD  hyoscyamine (ANASPAZ) 0.125 MG TBDP disintergrating tablet Place 1 tablet (0.125 mg total) under the tongue every 4 (four) hours as needed. 10/09/18   Bosie Clos, MD  Multiple Vitamin (MULTIVITAMIN WITH MINERALS) TABS tablet Take 1 tablet by mouth daily.    [provider]  omeprazole (PRILOSEC) 20 MG capsule Take by mouth.    [provider]  rosuvastatin (CRESTOR) 10 MG tablet TAKE 1 TABLET BY MOUTH DAILY 07/26/22   Simmons-Robinson, Tawanna Cooler, MD  sildenafil (VIAGRA)  50 MG tablet Take 1 tablet (50 mg total) by mouth as needed for erectile dysfunction. 04/25/21   Drubel, Lillia Abed, PA-C  verapamil (VERELAN PM) 360 MG 24 hr capsule Take 1 capsule (360 mg total) by mouth at bedtime. 07/16/22   Simmons-Robinson, Tawanna Cooler, MD    Family History Family History  Problem Relation Age of Onset   Depression Mother    Anxiety disorder Mother    Congestive Heart Failure Father    Prostate cancer Father    Hypertension Father     Social History Social History   Tobacco Use   Smoking status: Never   Smokeless  tobacco: Never  Vaping Use   Vaping Use: Never used  Substance Use Topics   Alcohol use: Not Currently    Comment: rarely   Drug use: No     Allergies   Penicillins, Sulfa antibiotics, and Amoxicillin   Review of Systems Review of Systems  Respiratory:  Positive for cough.      Physical Exam Triage Vital Signs ED Triage Vitals [12/09/22 1207]  Enc Vitals Group     BP 133/82     Pulse Rate 67     Resp 18     Temp 98.3 F (36.8 C)     Temp Source Oral     SpO2 95 %     Weight      Height      Head Circumference      Peak Flow      Pain Score      Pain Loc      Pain Edu?      Excl. in GC?    No data found.  Updated Vital Signs BP 133/82 (BP Location: Left Arm)   Pulse 67   Temp 98.3 F (36.8 C) (Oral)   Resp 18   SpO2 95%   Visual Acuity Right Eye Distance:   Left Eye Distance:   Bilateral Distance:    Right Eye Near:   Left Eye Near:    Bilateral Near:     Physical Exam Vitals reviewed.  Constitutional:      Appearance: Normal appearance.  HENT:     Right Ear: Tympanic membrane normal.     Left Ear: Tympanic membrane normal.     Mouth/Throat:     Pharynx: No oropharyngeal exudate or posterior oropharyngeal erythema.  Cardiovascular:     Rate and Rhythm: Normal rate and regular rhythm.     Pulses: Normal pulses.     Heart sounds: Normal heart sounds.  Pulmonary:     Effort: Pulmonary effort is normal.     Breath sounds: Normal breath sounds.  Skin:    General: Skin is warm and dry.  Neurological:     General: No focal deficit present.     Mental Status: He is alert and oriented to person, place, and time.  Psychiatric:        Mood and Affect: Mood normal.        Behavior: Behavior normal.      UC Treatments / Results  Labs (all labs ordered are listed, but only abnormal results are displayed) Labs Reviewed - No data to display  EKG   Radiology No results found.  Procedures Procedures (including critical care  time)  Medications Ordered in UC Medications - No data to display  Initial Impression / Assessment and Plan / UC Course  I have reviewed the triage vital signs and the nursing notes.  Pertinent labs & imaging results  that were available during my care of the patient were reviewed by me and considered in my medical decision making (see chart for details).   Dessie Coma Kaimani Beesley. is a 62 y.o. male presenting with URI symptoms. Patient is afebrile without recent antipyretics, satting well on room air. Overall is ill appearing though non-toxic, well hydrated, without respiratory distress. Pulmonary exam is unremarkable.  Lungs CTAB without wheezing, rhonchi, rales. RRR without murmurs, rubs, gallops.  Pharyngeal erythema or peritonsillar exudates.  TMs are WNL bilaterally.  Reviewed relevant chart history.   Differential diagnoses include exacerbation of allergic rhinitis versus acute viral URI.  Given his long-term symptoms of sinusitis, also considering acute bacterial sinusitis.  Other than Zyrtec, which she takes chronically, he is not currently using any over-the-counter medications to treat his symptoms.  I am recommending that he start using a cough suppressant with nasal decongestant.  After shared decision making with the patient where we discussed the lack of colored sputum and mucus production, we agreed to hold off on prescription of an antibiotic and rather start corticosteroid.  If his symptoms do not respond to treatment, he will return for likely antibiotic.  Counseled patient on potential for adverse effects with medications prescribed/recommended today, ER and return-to-clinic precautions discussed, patient verbalized understanding and agreement with care plan.  Final Clinical Impressions(s) / UC Diagnoses   Final diagnoses:  None   Discharge Instructions   None    ED Prescriptions   None    PDMP not reviewed this encounter.   Charma Igo, [place] 12/09/22  1236

## 2022-12-09 NOTE — Discharge Instructions (Addendum)
Recommend use of a combined cold/cough medication such as DayQuil and NyQuil control your symptoms.  I have prescribed a corticosteroid which should also provide some relief.  If your symptoms are worsening, or if they recur, follow up here or with your primary care provider.

## 2022-12-13 ENCOUNTER — Encounter: Payer: Self-pay | Admitting: Family Medicine

## 2022-12-16 ENCOUNTER — Ambulatory Visit (INDEPENDENT_AMBULATORY_CARE_PROVIDER_SITE_OTHER): Payer: Medicare Other | Admitting: Family Medicine

## 2022-12-16 ENCOUNTER — Encounter: Payer: Self-pay | Admitting: Family Medicine

## 2022-12-16 VITALS — BP 121/80 | HR 65 | Resp 13 | Ht 67.0 in | Wt 164.8 lb

## 2022-12-16 DIAGNOSIS — E782 Mixed hyperlipidemia: Secondary | ICD-10-CM

## 2022-12-16 DIAGNOSIS — J0140 Acute pansinusitis, unspecified: Secondary | ICD-10-CM | POA: Diagnosis not present

## 2022-12-16 DIAGNOSIS — I10 Essential (primary) hypertension: Secondary | ICD-10-CM

## 2022-12-16 MED ORDER — LEVOCETIRIZINE DIHYDROCHLORIDE 5 MG PO TABS: 5.0000 mg | ORAL_TABLET | Freq: Every evening | ORAL | Status: DC

## 2022-12-16 MED ORDER — DOXYCYCLINE HYCLATE 100 MG PO TABS
100.0000 mg | ORAL_TABLET | Freq: Two times a day (BID) | ORAL | 0 refills | Status: AC
Start: 2022-12-16 — End: 2022-12-26

## 2022-12-16 MED ORDER — AZELASTINE HCL 0.1 % NA SOLN: 2.0000 | Freq: Two times a day (BID) | NASAL | 6 refills | Status: AC

## 2022-12-16 MED ORDER — BENZONATATE 100 MG PO CAPS: 200.0000 mg | ORAL_CAPSULE | Freq: Two times a day (BID) | ORAL | 0 refills | Status: DC | PRN

## 2022-12-16 MED ORDER — OMEPRAZOLE 20 MG PO CPDR: 20.0000 mg | DELAYED_RELEASE_CAPSULE | Freq: Two times a day (BID) | ORAL | Status: DC

## 2022-12-16 NOTE — Progress Notes (Signed)
I,Vanessa  Vital,acting as a Neurosurgeon for [employer], MD.,have documented all relevant documentation on the behalf of Ronnald Ramp, MD,as directed by  Ronnald Ramp, MD while in the presence of Ronnald Ramp, MD.  Established patient visit   Patient: William Coleman.   DOB: 01/29/1961   61 y.o. Male  MRN: [id number] Visit Date: 12/16/2022  Today's healthcare provider: Ronnald Ramp, MD   Chief Complaint  Patient presents with   Hypertension   Sinus Problem    States was seen at [facility] for symptoms but is still not feeling a lot better. States to be congested, sinus pressure and still has a cough.   Subjective    HPI HPI     Sinus Problem    Additional comments: States was seen at [facility] for symptoms but is still not feeling a lot better. States to be congested, sinus pressure and still has a cough.      Last edited by Lubertha Basque, CMA on 12/16/2022  2:54 PM.      Hypertension, follow-up  BP Readings from Last 3 Encounters:  12/16/22 121/80  12/09/22 133/82  08/15/22 138/85   Wt Readings from Last 3 Encounters:  12/16/22 164 lb 12.8 oz (74.8 kg)  08/15/22 166 lb (75.3 kg)  07/16/22 169 lb 3.2 oz (76.7 kg)     He was last seen for hypertension 4 months ago.  BP at that visit was 138/85. Management since that visit includes no changes.  He reports good compliance with treatment. He is not having side effects. He is following a Regular diet. He is not exercising. He does not smoke.  Use of agents associated with hypertension: none.    Symptoms: No chest pain No chest pressure  No palpitations No syncope  No dyspnea No orthopnea  No paroxysmal nocturnal dyspnea No lower extremity edema   Pertinent labs Lab Results  Component Value Date   CHOL 151 05/14/2022   HDL 47 05/14/2022   LDLCALC 78 05/14/2022   TRIG 148 05/14/2022   CHOLHDL 3.2 05/14/2022   Lab Results  Component Value Date   NA 140  05/14/2022   K 3.8 05/14/2022   CREATININE 1.07 05/14/2022   EGFR 79 05/14/2022   GLUCOSE 82 05/14/2022   TSH 0.730 05/14/2022     The 10-year ASCVD risk score (Arnett DK, et al., 2019) is: 13.8%  ---------------------------------------------------------------------------------------------------   Discussed the use of AI scribe software for clinical note transcription with the patient, who gave verbal consent to proceed.  History of Present Illness   The patient, with a history of chronic sinus issues, presents with worsening symptoms over the past two to three weeks. The patient reports increased coughing and sinus drainage, which he describes as worse than his usual baseline. The drainage is primarily from the nostrils, but also possibly post-nasal. The patient denies any associated fever, headache, vomiting, or significant pain. The patient also denies any change in the color of the mucus.  The patient has been managing his symptoms with over-the-counter antihistamines, recently switching from Zyrtec to Xyzal. He also uses Flonase and Atrovent nasal sprays, and has tried a sinus rinse, albeit reluctantly due to its time-consuming nature. The patient was prescribed a course of prednisone by an urgent care doctor approximately a week prior to this consultation, which may have provided some relief, but he is unsure.  The patient also reports a history of acid reflux and GERD, for which he has been taking Prilosec 20mg  once daily  for over a year. Recently, under the guidance of his GI doctor, he increased the dosage to 20mg  twice daily. The patient notes that despite taking Prilosec, he has not observed any improvement in his sinus symptoms.  The patient's blood pressure is well-controlled on verapamil 360mg . He has no complaints of dizziness or other side effects. The patient tested negative for COVID-19 at home two weeks prior to this consultation. He has an upcoming appointment with an ear,  nose, and throat specialist, but the earliest available appointment is not until August.       Medications: Outpatient Medications Prior to Visit  Medication Sig   Ascorbic Acid (VITAMIN C) 100 MG tablet Take 100 mg by mouth daily.   B Complex Vitamins (VITAMIN B COMPLEX) TABS    Cholecalciferol (VITAMIN D3) 10 MCG (400 UNIT) CAPS    diphenoxylate-atropine (LOMOTIL) 2.5-0.025 MG tablet Take 2 tablets by mouth 4 (four) times daily as needed for diarrhea or loose stools.   hyoscyamine (ANASPAZ) 0.125 MG TBDP disintergrating tablet Place 1 tablet (0.125 mg total) under the tongue every 4 (four) hours as needed.   Multiple Vitamin (MULTIVITAMIN WITH MINERALS) TABS tablet Take 1 tablet by mouth daily.   rosuvastatin (CRESTOR) 10 MG tablet TAKE 1 TABLET BY MOUTH DAILY   sildenafil (VIAGRA) 50 MG tablet Take 1 tablet (50 mg total) by mouth as needed for erectile dysfunction.   verapamil (VERELAN PM) 360 MG 24 hr capsule Take 1 capsule (360 mg total) by mouth at bedtime.   [DISCONTINUED] omeprazole (PRILOSEC OTC) 20 MG tablet Take 20 mg by mouth daily.   colestipol (COLESTID) 1 g tablet Take by mouth.   [DISCONTINUED] omeprazole (PRILOSEC) 20 MG capsule Take by mouth.   No facility-administered medications prior to visit.    Review of Systems  HENT:  Positive for congestion and sinus pressure.   Respiratory:  Positive for cough.        Objective    BP 121/80 (BP Location: Left Arm, Patient Position: Sitting, Cuff Size: Normal)   Pulse 65   Resp 13   Ht 5\' 7"  (1.702 m)   Wt 164 lb 12.8 oz (74.8 kg)   SpO2 96%   BMI 25.81 kg/m    Physical Exam  Physical Exam   HEENT: Eardrum not red or bulging, no fluid behind eardrum, nasal mucosa without color change in mucus. CHEST: Lungs clear to auscultation, no wheezing or fluid crackles. CARDIOVASCULAR: Heart examination reveals no abnormalities.       No results found for any visits on 12/16/22.  Assessment & Plan     Problem List  Items Addressed This Visit     HLD (hyperlipidemia)    Chronic Continue crestor 10mg  daily       HTN (hypertension)    Chronic Controlled BP at goal today Continue verapamil 360mg  daily          Sinusitis - Primary    Persistent sinus drainage and coughing for several weeks, not improved with prednisone. No fever, headache, or change in mucus color. -Chronic, worsening symptoms acutely  -somewhat improved on prednisone 50mg  although symptoms still present after completing course of steroids -prescribed doxycycline 100mg  twice daily for 10 days  -Continue Xyzal 5mg  daily for allergy symptoms. -Prescribe Astepro nasal spray, use as directed. -Prescribe Tessalon Perles 200mg  twice daily for cough. -f/u with ENT as scheduled       Relevant Medications   levocetirizine (XYZAL) 5 MG tablet   azelastine (ASTELIN) 0.1 % nasal  spray   benzonatate (TESSALON) 100 MG capsule   doxycycline (VIBRA-TABS) 100 MG tablet        Return in about 6 months (around 06/18/2023) for HTN.        The entirety of the information documented in the History of Present Illness, Review of Systems and Physical Exam were personally obtained by me. Portions of this information were initially documented by Lubertha Basque, CMA. I, Ronnald Ramp, MD have reviewed the documentation above for thoroughness and accuracy.      Ronnald Ramp, MD  [facility] [phone] (phone) [phone] (fax)  [facility]

## 2022-12-16 NOTE — Assessment & Plan Note (Addendum)
Persistent sinus drainage and coughing for several weeks, not improved with prednisone. No fever, headache, or change in mucus color. -Chronic, worsening symptoms acutely  -somewhat improved on prednisone 50mg  although symptoms still present after completing course of steroids -prescribed doxycycline 100mg  twice daily for 10 days  -Continue Xyzal 5mg  daily for allergy symptoms. -Prescribe Astepro nasal spray, use as directed. -Prescribe Tessalon Perles 200mg  twice daily for cough. -f/u with ENT as scheduled

## 2022-12-16 NOTE — Assessment & Plan Note (Signed)
Chronic Continue crestor 10 mg daily 

## 2022-12-16 NOTE — Assessment & Plan Note (Signed)
Chronic Controlled BP at goal today Continue verapamil 360mg  daily

## 2022-12-23 ENCOUNTER — Ambulatory Visit: Payer: Medicare Other

## 2022-12-23 VITALS — BP 122/78 | Ht 67.0 in | Wt 167.9 lb

## 2022-12-23 DIAGNOSIS — Z0000 Encounter for general adult medical examination without abnormal findings: Secondary | ICD-10-CM | POA: Diagnosis not present

## 2022-12-23 NOTE — Progress Notes (Signed)
Subjective:   William Coleman. is a 62 y.o. male who presents for Medicare Annual/Subsequent preventive examination.  Visit Complete: In person  Patient Medicare AWV questionnaire was completed by the patient on 12/19/22; I have confirmed that all information answered by patient is correct and no changes since this date.  Review of Systems          Objective:    Today's Vitals   12/19/22 1023 12/23/22 1501  Weight:  167 lb 14.4 oz (76.2 kg)  Height:  5\' 7"  (1.702 m)  PainSc: 0-No pain    Body mass index is 26.3 kg/m.     12/23/2022    3:06 PM 12/13/2021    2:53 PM 11/18/2018    6:54 PM 11/17/2018    9:34 PM 05/26/2017    3:09 PM 12/31/2016    4:41 PM 12/30/2016    7:32 PM  Advanced Directives  Does Patient Have a Medical Advance Directive? No No  No No  No  Would patient like information on creating a medical advance directive?  No - Patient declined  No - Patient declined        Information is confidential and restricted. Go to Review Flowsheets to unlock data.    Current Medications (verified) Outpatient Encounter Medications as of 12/23/2022  Medication Sig   Ascorbic Acid (VITAMIN C) 100 MG tablet Take 100 mg by mouth daily.   azelastine (ASTELIN) 0.1 % nasal spray Place 2 sprays into both nostrils 2 (two) times daily. Use in each nostril as directed   B Complex Vitamins (VITAMIN B COMPLEX) TABS    benzonatate (TESSALON) 100 MG capsule Take 2 capsules (200 mg total) by mouth 2 (two) times daily as needed for cough.   Cholecalciferol (VITAMIN D3) 10 MCG (400 UNIT) CAPS    colestipol (COLESTID) 1 g tablet Take by mouth.   diphenoxylate-atropine (LOMOTIL) 2.5-0.025 MG tablet Take 2 tablets by mouth 4 (four) times daily as needed for diarrhea or loose stools.   doxycycline (VIBRA-TABS) 100 MG tablet Take 1 tablet (100 mg total) by mouth 2 (two) times daily for 10 days.   hyoscyamine (ANASPAZ) 0.125 MG TBDP disintergrating tablet Place 1 tablet (0.125 mg total)  under the tongue every 4 (four) hours as needed.   levocetirizine (XYZAL) 5 MG tablet Take 1 tablet (5 mg total) by mouth every evening.   Multiple Vitamin (MULTIVITAMIN WITH MINERALS) TABS tablet Take 1 tablet by mouth daily.   omeprazole (PRILOSEC) 20 MG capsule Take 1 capsule (20 mg total) by mouth 2 (two) times daily before a meal.   rosuvastatin (CRESTOR) 10 MG tablet TAKE 1 TABLET BY MOUTH DAILY   sildenafil (VIAGRA) 50 MG tablet Take 1 tablet (50 mg total) by mouth as needed for erectile dysfunction.   verapamil (VERELAN PM) 360 MG 24 hr capsule Take 1 capsule (360 mg total) by mouth at bedtime.   No facility-administered encounter medications on file as of 12/23/2022.    Allergies (verified) Penicillins, Sulfa antibiotics, and Amoxicillin   History: Past Medical History:  Diagnosis Date   Allergy    ongoing ENT outpatient treatment   Anancastic neurosis    Anginal pain (HCC)    Anxiety, generalized    Bipolar disorder (HCC)    Chronic diarrhea    Depression    GERD (gastroesophageal reflux disease)    currently taking Prilosec OTC 20 mg   Hypertension    Hypothyroidism    Laryngopharyngeal reflux (LPR) 01/01/2018   Obsessive  compulsive disorder    OCD (obsessive compulsive disorder)    Sleep apnea    patient states it was "marginal", no CPAP   Past Surgical History:  Procedure Laterality Date   COLONOSCOPY WITH PROPOFOL N/A 02/05/2016   Procedure: COLONOSCOPY WITH PROPOFOL;  Surgeon: Midge Minium, MD;  Location: [facility];  Service: Endoscopy;  Laterality: N/A;   MOUTH SURGERY     orthodontic surgery for underbite   MYRINGOTOMY WITH TUBE PLACEMENT     NASAL SEPTUM SURGERY     NASAL SINUS SURGERY Bilateral 10/09/2020   POLYPECTOMY N/A 02/05/2016   Procedure: POLYPECTOMY;  Surgeon: Midge Minium, MD;  Location: [facility];  Service: Endoscopy;  Laterality: N/A;   Family History  Problem Relation Age of Onset   Depression Mother    Anxiety  disorder Mother    Congestive Heart Failure Father    Prostate cancer Father    Hypertension Father    Hypertension Sister    Social History   Socioeconomic History   Marital status: Single    Spouse name: Not on file   Number of children: 0   Years of education: Not on file   Highest education level: Some college, no degree  Occupational History   Not on file  Tobacco Use   Smoking status: Never   Smokeless tobacco: Never   Tobacco comments:    N/A  Vaping Use   Vaping status: Never Used  Substance and Sexual Activity   Alcohol use: Not Currently    Comment: Rarely   Drug use: Never   Sexual activity: Not Currently    Birth control/protection: Condom  Other Topics Concern   Not on file  Social History Narrative   Not on file   Social Determinants of Health   Financial Resource Strain: Low Risk  (12/19/2022)   Overall Financial Resource Strain (CARDIA)    Difficulty of Paying Living Expenses: Not hard at all  Food Insecurity: No Food Insecurity (12/19/2022)   Hunger Vital Sign    Worried About Running Out of Food in the Last Year: Never true    Ran Out of Food in the Last Year: Never true  Transportation Needs: No Transportation Needs (12/19/2022)   PRAPARE - Administrator, Civil Service (Medical): No    Lack of Transportation (Non-Medical): No  Physical Activity: Inactive (12/19/2022)   Exercise Vital Sign    Days of Exercise per Week: 0 days    Minutes of Exercise per Session: 30 min  Stress: No Stress Concern Present (12/19/2022)   [employer] of Occupational Health - Occupational Stress Questionnaire    Feeling of Stress : Not at all  Social Connections: Moderately Integrated (12/19/2022)   Social Connection and Isolation Panel [NHANES]    Frequency of Communication with Friends and Family: More than three times a week    Frequency of Social Gatherings with Friends and Family: More than three times a week    Attends Religious Services: More  than 4 times per year    Active Member of [employer] or Organizations: Yes    Attends Engineer, structural: More than 4 times per year    Marital Status: Never married    Tobacco Counseling Counseling given: Not Answered Tobacco comments: N/A   Clinical Intake:  Pre-visit preparation completed: Yes  Pain : No/denies pain Pain Score: 0-No pain     BMI - recorded: 26.3 Nutritional Status: BMI 25 -29 Overweight Nutritional Risks: None Diabetes: No  How often do you need to have someone help you when you read instructions, pamphlets, or other written materials from your doctor or pharmacy?: 1 - Never  Interpreter Needed?: No  Comments: lives alone Information entered by :: B.Johnathon Olden,LPN   Activities of Daily Living    12/19/2022   10:23 AM 12/13/2022    9:58 AM  In your present state of health, do you have any difficulty performing the following activities:  Hearing? 0 0  Vision? 0 0  Difficulty concentrating or making decisions? 0 0  Walking or climbing stairs? 0 0  Dressing or bathing? 0 0  Doing errands, shopping? 0 0  Preparing Food and eating ? N N  Using the Toilet? N N  In the past six months, have you accidently leaked urine? N N  Do you have problems with loss of bowel control? N N  Managing your Medications? N N  Managing your Finances? N N  Housekeeping or managing your Housekeeping? N N    Patient Care Team: Ronnald Ramp, MD as PCP - General (Family Medicine)  Indicate any recent Medical Services you may have received from other than [facility] providers in the past year (date may be approximate).     Assessment:   This is a routine wellness examination for [place].  Hearing/Vision screen Hearing Screening - Comments:: Adequate hearing in rt ear;lft ear totally deaf for years Vision Screening - Comments:: Adequate vision;readers only [facility]  Dietary issues and exercise activities discussed:     Goals Addressed              This Visit's Progress    DIET - EAT MORE FRUITS AND VEGETABLES   On track     Depression Screen    12/23/2022    3:05 PM 12/16/2022    2:58 PM 08/15/2022    2:35 PM 05/14/2022    3:03 PM 03/19/2022    2:16 PM 12/20/2021    3:16 PM 12/13/2021    2:49 PM  PHQ 2/9 Scores  PHQ - 2 Score 0 0 0 0   0  PHQ- 9 Score   0 0   0     Information is confidential and restricted. Go to Review Flowsheets to unlock data.    Fall Risk    12/19/2022   10:23 AM 12/13/2022    9:58 AM 07/02/2022    3:29 PM 05/14/2022    3:03 PM 12/13/2021    2:54 PM  Fall Risk   Falls in the past year? 0 0 0 0 0  Number falls in past yr: 0 0 0 0 0  Injury with Fall? 0 0 0 0 0  Risk for fall due to :   No Fall Risks No Fall Risks No Fall Risks  Follow up Falls prevention discussed;Education provided        MEDICARE RISK AT HOME:   TIMED UP AND GO:  Was the test performed?  Yes  Length of time to ambulate 10 feet: 10 sec Gait steady and fast without use of assistive device    Cognitive Function:        12/23/2022    3:06 PM 12/13/2021    3:00 PM  6CIT Screen  What Year? 0 points 0 points  What month? 0 points 0 points  What time? 0 points 0 points  Count back from 20 0 points 0 points  Months in reverse 0 points 0 points  Repeat phrase 0 points 0 points  Total  Score 0 points 0 points    Immunizations Immunization History  Administered Date(s) Administered   COVID-19, mRNA, vaccine(Comirnaty)12 years and older 09/16/2019, 10/07/2019, 06/19/2020   Hep A / Hep B 07/24/2007, 08/21/2007, 02/12/2008   Hepatitis A 07/24/2007, 08/21/2007, 02/12/2008   Hepatitis A, Adult 07/24/2007, 08/21/2007, 02/12/2008   Hepatitis B 07/24/2007, 07/24/2007, 08/21/2007, 08/21/2007, 02/12/2008, 02/12/2008   Influenza Split 03/23/2010, 02/26/2016   Influenza,inj,Quad PF,6+ Mos 03/07/2014, 03/10/2017, 02/22/2019, 03/13/2020, 03/15/2022   Influenza-Unspecified 05/13/1997, 05/11/2018, 02/23/2019, 03/13/2020, 03/14/2021    PFIZER Comirnaty(Gray Top)Covid-19 Tri-Sucrose Vaccine 04/11/2022   PFIZER(Purple Top)SARS-COV-2 Vaccination 11/30/2020   Pfizer Covid-19 Vaccine Bivalent Booster 33yrs & up 03/30/2021   Td 07/02/2017, 07/02/2017   Tdap 07/24/2007    TDAP status: Up to date  Flu Vaccine status: Up to date   Covid-19 vaccine status: Completed vaccines  Qualifies for Shingles Vaccine? No    Screening Tests Health Maintenance  Topic Date Due   Zoster Vaccines- Shingrix (1 of 2) Never done   COVID-19 Vaccine (7 - 2023-24 season) 06/06/2022   INFLUENZA VACCINE  01/09/2023   Medicare Annual Wellness (AWV)  12/23/2023   DTaP/Tdap/Td (4 - Td or Tdap) 07/03/2027   Colonoscopy  12/04/2031   Hepatitis C Screening  Completed   HIV Screening  Completed   HPV VACCINES  Aged Out    Health Maintenance  Health Maintenance Due  Topic Date Due   Zoster Vaccines- Shingrix (1 of 2) Never done   COVID-19 Vaccine (7 - 2023-24 season) 06/06/2022    Colorectal cancer screening: Type of screening: Colonoscopy. Completed yes. Repeat every 10 years  Lung Cancer Screening: (Low Dose CT Chest recommended if Age 41-80 years, 20 pack-year currently smoking OR have quit w/in 15years.) does not qualify.   Lung Cancer Screening Referral: no  Additional Screening:  Hepatitis C Screening: does not qualify; Completed yes  Vision Screening: Recommended annual ophthalmology exams for early detection of glaucoma and other disorders of the eye. Is the patient up to date with their annual eye exam?  Yes  Who is the provider or what is the name of the office in which the patient attends annual eye exams? [facility] If pt is not established with a provider, would they like to be referred to a provider to establish care? No .   Dental Screening: Recommended annual dental exams for proper oral hygiene  Diabetic Foot Exam:   Community Resource Referral / Chronic Care Management: CRR required this visit?  No   CCM  required this visit?  No     Plan:     I have personally reviewed and noted the following in the patient's chart:   Medical and social history Use of alcohol, tobacco or illicit drugs  Current medications and supplements including opioid prescriptions. Patient is not currently taking opioid prescriptions. Functional ability and status Nutritional status Physical activity Advanced directives List of other physicians Hospitalizations, surgeries, and ER visits in previous 12 months Vitals Screenings to include cognitive, depression, and falls Referrals and appointments  In addition, I have reviewed and discussed with patient certain preventive protocols, quality metrics, and best practice recommendations. A written personalized care plan for preventive services as well as general preventive health recommendations were provided to patient.     Sue Lush, LPN   06/28/1476   After Visit Summary: (MyChart) Due to this being a telephonic visit, the after visit summary with patients personalized plan was offered to patient via MyChart   Nurse Notes: The patient states  he is doing well and has no concerns or questions at this time.

## 2022-12-23 NOTE — Patient Instructions (Addendum)
William Coleman , Thank you for taking time to come for your Medicare Wellness Visit. I appreciate your ongoing commitment to your health goals. Please review the following plan we discussed and let me know if I can assist you in the future.   These are the goals we discussed:  Goals      DIET - EAT MORE FRUITS AND VEGETABLES        This is a list of the screening recommended for you and due dates:  Health Maintenance  Topic Date Due   Zoster (Shingles) Vaccine (1 of 2) Never done   COVID-19 Vaccine (7 - 2023-24 season) 06/06/2022   Flu Shot  01/09/2023   Medicare Annual Wellness Visit  12/23/2023   DTaP/Tdap/Td vaccine (4 - Td or Tdap) 07/03/2027   Colon Cancer Screening  12/04/2031   Hepatitis C Screening  Completed   HIV Screening  Completed   HPV Vaccine  Aged Out    Advanced directives: no  Conditions/risks identified: low falls risk  Next appointment: Follow up in one year for your annual wellness visit 12/24/2023 @ 2: 30pm in person  Preventive Care 40-64 Years, Male Preventive care refers to lifestyle choices and visits with your health care provider that can promote health and wellness. What does preventive care include? A yearly physical exam. This is also called an annual well check. Dental exams once or twice a year. Routine eye exams. Ask your health care provider how often you should have your eyes checked. Personal lifestyle choices, including: Daily care of your teeth and gums. Regular physical activity. Eating a healthy diet. Avoiding tobacco and drug use. Limiting alcohol use. Practicing safe sex. Taking low-dose aspirin every day starting at age 28. What happens during an annual well check? The services and screenings done by your health care provider during your annual well check will depend on your age, overall health, lifestyle risk factors, and family history of disease. Counseling  Your health care provider may ask you questions about your: Alcohol  use. Tobacco use. Drug use. Emotional well-being. Home and relationship well-being. Sexual activity. Eating habits. Work and work Astronomer. Screening  You may have the following tests or measurements: Height, weight, and BMI. Blood pressure. Lipid and cholesterol levels. These may be checked every 5 years, or more frequently if you are over 11 years old. Skin check. Lung cancer screening. You may have this screening every year starting at age 22 if you have a 30-pack-year history of smoking and currently smoke or have quit within the past 15 years. Fecal occult blood test (FOBT) of the stool. You may have this test every year starting at age 57. Flexible sigmoidoscopy or colonoscopy. You may have a sigmoidoscopy every 5 years or a colonoscopy every 10 years starting at age 68. Prostate cancer screening. Recommendations will vary depending on your family history and other risks. Hepatitis C blood test. Hepatitis B blood test. Sexually transmitted disease (STD) testing. Diabetes screening. This is done by checking your blood sugar (glucose) after you have not eaten for a while (fasting). You may have this done every 1-3 years. Discuss your test results, treatment options, and if necessary, the need for more tests with your health care provider. Vaccines  Your health care provider may recommend certain vaccines, such as: Influenza vaccine. This is recommended every year. Tetanus, diphtheria, and acellular pertussis (Tdap, Td) vaccine. You may need a Td booster every 10 years. Zoster vaccine. You may need this after age 44. Pneumococcal 13-valent  conjugate (PCV13) vaccine. You may need this if you have certain conditions and have not been vaccinated. Pneumococcal polysaccharide (PPSV23) vaccine. You may need one or two doses if you smoke cigarettes or if you have certain conditions. Talk to your health care provider about which screenings and vaccines you need and how often you need  them. This information is not intended to replace advice given to you by your health care provider. Make sure you discuss any questions you have with your health care provider. Document Released: 06/23/2015 Document Revised: 02/14/2016 Document Reviewed: 03/28/2015 Elsevier Interactive Patient Education  2017 [employer].  Fall Prevention in the Home Falls can cause injuries. They can happen to people of all ages. There are many things you can do to make your home safe and to help prevent falls. What can I do on the outside of my home? Regularly fix the edges of walkways and driveways and fix any cracks. Remove anything that might make you trip as you walk through a door, such as a raised step or threshold. Trim any bushes or trees on the path to your home. Use bright outdoor lighting. Clear any walking paths of anything that might make someone trip, such as rocks or tools. Regularly check to see if handrails are loose or broken. Make sure that both sides of any steps have handrails. Any raised decks and porches should have guardrails on the edges. Have any leaves, snow, or ice cleared regularly. Use sand or salt on walking paths during winter. Clean up any spills in your garage right away. This includes oil or grease spills. What can I do in the bathroom? Use night lights. Install grab bars by the toilet and in the tub and shower. Do not use towel bars as grab bars. Use non-skid mats or decals in the tub or shower. If you need to sit down in the shower, use a plastic, non-slip stool. Keep the floor dry. Clean up any water that spills on the floor as soon as it happens. Remove soap buildup in the tub or shower regularly. Attach bath mats securely with double-sided non-slip rug tape. Do not have throw rugs and other things on the floor that can make you trip. What can I do in the bedroom? Use night lights. Make sure that you have a light by your bed that is easy to reach. Do not use  any sheets or blankets that are too big for your bed. They should not hang down onto the floor. Have a firm chair that has side arms. You can use this for support while you get dressed. Do not have throw rugs and other things on the floor that can make you trip. What can I do in the kitchen? Clean up any spills right away. Avoid walking on wet floors. Keep items that you use a lot in easy-to-reach places. If you need to reach something above you, use a strong step stool that has a grab bar. Keep electrical cords out of the way. Do not use floor polish or wax that makes floors slippery. If you must use wax, use non-skid floor wax. Do not have throw rugs and other things on the floor that can make you trip. What can I do with my stairs? Do not leave any items on the stairs. Make sure that there are handrails on both sides of the stairs and use them. Fix handrails that are broken or loose. Make sure that handrails are as long as the stairways. Check  any carpeting to make sure that it is firmly attached to the stairs. Fix any carpet that is loose or worn. Avoid having throw rugs at the top or bottom of the stairs. If you do have throw rugs, attach them to the floor with carpet tape. Make sure that you have a light switch at the top of the stairs and the bottom of the stairs. If you do not have them, ask someone to add them for you. What else can I do to help prevent falls? Wear shoes that: Do not have high heels. Have rubber bottoms. Are comfortable and fit you well. Are closed at the toe. Do not wear sandals. If you use a stepladder: Make sure that it is fully opened. Do not climb a closed stepladder. Make sure that both sides of the stepladder are locked into place. Ask someone to hold it for you, if possible. Clearly mark and make sure that you can see: Any grab bars or handrails. First and last steps. Where the edge of each step is. Use tools that help you move around (mobility aids)  if they are needed. These include: Canes. Walkers. Scooters. Crutches. Turn on the lights when you go into a dark area. Replace any light bulbs as soon as they burn out. Set up your furniture so you have a clear path. Avoid moving your furniture around. If any of your floors are uneven, fix them. If there are any pets around you, be aware of where they are. Review your medicines with your doctor. Some medicines can make you feel dizzy. This can increase your chance of falling. Ask your doctor what other things that you can do to help prevent falls. This information is not intended to replace advice given to you by your health care provider. Make sure you discuss any questions you have with your health care provider. Document Released: 03/23/2009 Document Revised: 11/02/2015 Document Reviewed: 07/01/2014 Elsevier Interactive Patient Education  2017 [employer].

## 2022-12-26 NOTE — Progress Notes (Signed)
This visit was IN PERSON.telephonic.Pt declined AVS.

## 2023-01-13 ENCOUNTER — Other Ambulatory Visit: Payer: Self-pay | Admitting: Family Medicine

## 2023-01-13 DIAGNOSIS — I10 Essential (primary) hypertension: Secondary | ICD-10-CM

## 2023-01-14 NOTE — Telephone Encounter (Signed)
Requested Prescriptions  Pending Prescriptions Disp Refills   verapamil (VERELAN) 360 MG 24 hr capsule [Pharmacy Med Name: VERAPAMIL HCL ER 360 MG CAP] 90 capsule 1    Sig: TAKE ONE CAPSULE BY MOUTH AT BEDTIME.     Cardiovascular: Calcium Channel Blockers 3 Passed - 01/13/2023  1:55 PM      Passed - ALT in normal range and within 360 days    ALT  Date Value Ref Range Status  05/14/2022 26 0 - 44 IU/L Final         Passed - AST in normal range and within 360 days    AST  Date Value Ref Range Status  05/14/2022 27 0 - 40 IU/L Final         Passed - Cr in normal range and within 360 days    Creatinine, Ser  Date Value Ref Range Status  05/14/2022 1.07 0.76 - 1.27 mg/dL Final         Passed - Last BP in normal range    BP Readings from Last 1 Encounters:  12/23/22 122/78         Passed - Last Heart Rate in normal range    Pulse Readings from Last 1 Encounters:  12/16/22 65         Passed - Valid encounter within last 6 months    Recent Outpatient Visits           4 weeks ago Subacute pansinusitis   [place] [facility] Simmons-Robinson, [place], MD   5 months ago Primary hypertension   [place] [facility] [place], [place], MD   6 months ago Primary hypertension   [place] [facility] Simmons-Robinson, [place], MD   6 months ago Primary hypertension   [place] [facility] Simmons-Robinson, [place], MD   7 months ago Primary hypertension    [facility] Simmons-Robinson, [place], MD       Future Appointments             In 5 months Simmons-Robinson, Tawanna Cooler, MD [facility]

## 2023-01-27 ENCOUNTER — Telehealth: Payer: Self-pay | Admitting: Family Medicine

## 2023-01-27 DIAGNOSIS — E782 Mixed hyperlipidemia: Secondary | ICD-10-CM

## 2023-01-27 MED ORDER — ROSUVASTATIN CALCIUM 10 MG PO TABS
10.0000 mg | ORAL_TABLET | Freq: Every day | ORAL | 1 refills | Status: DC
Start: 2023-01-27 — End: 2023-07-28

## 2023-01-27 NOTE — Telephone Encounter (Signed)
[facility] faxed refill request for the following medications:   rosuvastatin (CRESTOR) 10 MG tablet     Please advise.

## 2023-01-28 ENCOUNTER — Other Ambulatory Visit
Admission: RE | Admit: 2023-01-28 | Discharge: 2023-01-28 | Disposition: A | Discharge status: Home / Self Care | Payer: Medicare Other | Source: Ambulatory Visit | Attending: Podiatry | Admitting: Podiatry

## 2023-01-28 ENCOUNTER — Other Ambulatory Visit: Payer: Self-pay | Admitting: Podiatry

## 2023-01-28 ENCOUNTER — Ambulatory Visit: Admission: RE | Admit: 2023-01-28 | Payer: Medicare Other | Source: Ambulatory Visit

## 2023-01-28 DIAGNOSIS — M7989 Other specified soft tissue disorders: Secondary | ICD-10-CM

## 2023-01-28 DIAGNOSIS — M79605 Pain in left leg: Secondary | ICD-10-CM | POA: Diagnosis not present

## 2023-01-28 DIAGNOSIS — L03116 Cellulitis of left lower limb: Secondary | ICD-10-CM | POA: Diagnosis not present

## 2023-01-28 DIAGNOSIS — R600 Localized edema: Secondary | ICD-10-CM | POA: Insufficient documentation

## 2023-01-28 LAB — D-DIMER, QUANTITATIVE: D-Dimer, Quant: 0.95 ug{FEU}/mL — ABNORMAL HIGH (ref 0.00–0.50)

## 2023-02-04 DIAGNOSIS — R600 Localized edema: Secondary | ICD-10-CM | POA: Diagnosis not present

## 2023-02-04 DIAGNOSIS — L03116 Cellulitis of left lower limb: Secondary | ICD-10-CM | POA: Diagnosis not present

## 2023-02-04 DIAGNOSIS — M79672 Pain in left foot: Secondary | ICD-10-CM | POA: Diagnosis not present

## 2023-02-05 ENCOUNTER — Encounter: Payer: Self-pay | Admitting: Family Medicine

## 2023-02-14 DIAGNOSIS — F3342 Major depressive disorder, recurrent, in full remission: Secondary | ICD-10-CM | POA: Diagnosis not present

## 2023-03-02 ENCOUNTER — Encounter (INDEPENDENT_AMBULATORY_CARE_PROVIDER_SITE_OTHER): Payer: Self-pay

## 2023-03-06 ENCOUNTER — Encounter (INDEPENDENT_AMBULATORY_CARE_PROVIDER_SITE_OTHER): Payer: Self-pay | Admitting: Nurse Practitioner

## 2023-03-06 ENCOUNTER — Ambulatory Visit (INDEPENDENT_AMBULATORY_CARE_PROVIDER_SITE_OTHER): Payer: Medicare Other | Admitting: Nurse Practitioner

## 2023-03-06 ENCOUNTER — Ambulatory Visit (INDEPENDENT_AMBULATORY_CARE_PROVIDER_SITE_OTHER): Payer: Medicare Other

## 2023-03-06 DIAGNOSIS — I10 Essential (primary) hypertension: Secondary | ICD-10-CM

## 2023-03-06 DIAGNOSIS — R600 Localized edema: Secondary | ICD-10-CM | POA: Diagnosis not present

## 2023-03-06 DIAGNOSIS — L03116 Cellulitis of left lower limb: Secondary | ICD-10-CM

## 2023-03-06 DIAGNOSIS — M79605 Pain in left leg: Secondary | ICD-10-CM

## 2023-03-06 DIAGNOSIS — I890 Lymphedema, not elsewhere classified: Secondary | ICD-10-CM

## 2023-03-10 ENCOUNTER — Encounter (INDEPENDENT_AMBULATORY_CARE_PROVIDER_SITE_OTHER): Payer: Self-pay | Admitting: Nurse Practitioner

## 2023-03-10 NOTE — Progress Notes (Signed)
Subjective:    Patient ID: William Coleman., male    DOB: 06/29/1960, 62 y.o.   MRN: [id number] Chief Complaint  Patient presents with   Follow-up    Ultrasound follow up    The patient returns today for follow-up evaluation of his lower extremity edema.  Initially when he was seen last year he was having issues with his right lower extremity swelling.  He notes that at that time the swelling got better but it was not an issue so he did not return for follow-up evaluation.  However he recently began to have swelling of his left lower extremity and developed cellulitis.  With antibiotics the redness has resolved but he still continues to have some edema notable.  Podiatry ordered a D-dimer which was mildly elevated but he had a negative DVT study.  The patient also notes that he has blood pressure which has been difficult to control and was started on verapamil.  He notes that some of the swelling did worsen after beginning to take his verapamil.  He notes he was previously on amlodipine and had difficulty with swelling at that time as well.  Today his left lower extremity is swollen but he has some minor swelling in the right as well.  Today noninvasive study showed no evidence of DVT or superficial thrombophlebitis bilaterally.  No evidence of deep venous insufficiency or superficial venous reflux noted bilaterally.    Review of Systems  Cardiovascular:  Positive for leg swelling.  All other systems reviewed and are negative.      Objective:   Physical Exam Vitals reviewed.  HENT:     Head: Normocephalic.  Cardiovascular:     Rate and Rhythm: Normal rate.     Pulses: Normal pulses.  Pulmonary:     Effort: Pulmonary effort is normal.  Musculoskeletal:     Right lower leg: 1+ Edema present.     Left lower leg: 2+ Edema present.  Skin:    General: Skin is warm and dry.  Neurological:     Mental Status: He is alert and oriented to person, place, and time.  Psychiatric:         Mood and Affect: Mood normal.        Behavior: Behavior normal.        Thought Content: Thought content normal.        Judgment: Judgment normal.     BP 139/86 (BP Location: Left Arm)   Pulse 63   Resp 16   Wt 174 lb (78.9 kg)   BMI 27.25 kg/m   Past Medical History:  Diagnosis Date   Allergy    ongoing ENT outpatient treatment   Anancastic neurosis    Anginal pain (HCC)    Anxiety, generalized    Bipolar disorder (HCC)    Chronic diarrhea    Depression    GERD (gastroesophageal reflux disease)    currently taking Prilosec OTC 20 mg   Hypertension    Hypothyroidism    Laryngopharyngeal reflux (LPR) 01/01/2018   Obsessive compulsive disorder    OCD (obsessive compulsive disorder)    Sleep apnea    patient states it was "marginal", no CPAP    Social History   Socioeconomic History   Marital status: Single    Spouse name: Not on file   Number of children: 0   Years of education: Not on file   Highest education level: Some college, no degree  Occupational History   Not on file  Tobacco Use   Smoking status: Never   Smokeless tobacco: Never   Tobacco comments:    N/A  Vaping Use   Vaping status: Never Used  Substance and Sexual Activity   Alcohol use: Not Currently    Comment: Rarely   Drug use: Never   Sexual activity: Not Currently    Birth control/protection: Condom  Other Topics Concern   Not on file  Social History Narrative   Not on file   Social Determinants of Health   Financial Resource Strain: Low Risk  (12/19/2022)   Overall Financial Resource Strain (CARDIA)    Difficulty of Paying Living Expenses: Not hard at all  Food Insecurity: No Food Insecurity (12/19/2022)   Hunger Vital Sign    Worried About Running Out of Food in the Last Year: Never true    Ran Out of Food in the Last Year: Never true  Transportation Needs: No Transportation Needs (12/19/2022)   PRAPARE - Administrator, Civil Service (Medical): No    Lack of  Transportation (Non-Medical): No  Physical Activity: Inactive (12/19/2022)   Exercise Vital Sign    Days of Exercise per Week: 0 days    Minutes of Exercise per Session: 30 min  Stress: No Stress Concern Present (12/19/2022)   [employer] of Occupational Health - Occupational Stress Questionnaire    Feeling of Stress : Not at all  Social Connections: Moderately Integrated (12/19/2022)   Social Connection and Isolation Panel [NHANES]    Frequency of Communication with Friends and Family: More than three times a week    Frequency of Social Gatherings with Friends and Family: More than three times a week    Attends Religious Services: More than 4 times per year    Active Member of [employer] or Organizations: Yes    Attends Banker Meetings: More than 4 times per year    Marital Status: Never married  Intimate Partner Violence: Not At Risk (12/23/2022)   Humiliation, Afraid, Rape, and Kick questionnaire    Fear of Current or Ex-Partner: No    Emotionally Abused: No    Physically Abused: No    Sexually Abused: No    Past Surgical History:  Procedure Laterality Date   COLONOSCOPY WITH PROPOFOL N/A 02/05/2016   Procedure: COLONOSCOPY WITH PROPOFOL;  Surgeon: Midge Minium, MD;  Location: [facility];  Service: Endoscopy;  Laterality: N/A;   MOUTH SURGERY     orthodontic surgery for underbite   MYRINGOTOMY WITH TUBE PLACEMENT     NASAL SEPTUM SURGERY     NASAL SINUS SURGERY Bilateral 10/09/2020   POLYPECTOMY N/A 02/05/2016   Procedure: POLYPECTOMY;  Surgeon: Midge Minium, MD;  Location: [facility];  Service: Endoscopy;  Laterality: N/A;    Family History  Problem Relation Age of Onset   Depression Mother    Anxiety disorder Mother    Congestive Heart Failure Father    Prostate cancer Father    Hypertension Father    Hypertension Sister     Allergies  Allergen Reactions   Penicillins Hives and Rash   Sulfa Antibiotics Hives   Amoxicillin Hives        Latest Ref Rng & Units 04/25/2021    2:42 PM 08/05/2019    4:32 PM 11/17/2018    9:38 PM  CBC  WBC 3.4 - 10.8 x10E3/uL 6.5  7.7  9.2   Hemoglobin 13.0 - 17.7 g/dL 40.9  81.1  91.4   Hematocrit 37.5 - 51.0 %  46.2  43.1  43.8   Platelets 150 - 450 x10E3/uL 250  300  280       CMP     Component Value Date/Time   NA 140 05/14/2022 1624   K 3.8 05/14/2022 1624   CL 100 05/14/2022 1624   CO2 25 05/14/2022 1624   GLUCOSE 82 05/14/2022 1624   GLUCOSE 92 11/24/2018 0633   BUN 16 05/14/2022 1624   CREATININE 1.07 05/14/2022 1624   CALCIUM 9.2 05/14/2022 1624   PROT 6.7 05/14/2022 1624   ALBUMIN 4.5 05/14/2022 1624   AST 27 05/14/2022 1624   ALT 26 05/14/2022 1624   ALKPHOS 66 05/14/2022 1624   BILITOT 0.7 05/14/2022 1624   GFR 79.85 06/24/2016 1218   EGFR 79 05/14/2022 1624   GFRNONAA 87 08/05/2019 1632     No results found.     Assessment & Plan:   1. Lymphedema I had a very long discussion with the patient in regards to his studies and how it relates to his lower extremity edema.  Initially the patient was very concerned about the elevated D-dimer but I explained in detail to the patient that the D-dimer is typically a study that indicates the presence of the possibility of clots but that it is to be correlated with the physical exam as well as follow-up studies if abnormal.  While the patient does have swelling that is concerning for possible DVT he did not have any symptoms concerning for pulmonary embolism such as chest pain or shortness of breath.  Since the patient's elevated D-dimer he has underwent 2 negative DVT studies by 2 separate offices.  Based on that a repeat D-dimer would not be necessary at this time.  Based on the patient's noninvasive studies I believe that he is suffering with lymphedema.  We had a long discussion of lymphedema and conservative therapy including use of medical grade compression, elevation and activity.  The patient has not quite been  utilizing his medical grade compression stockings correctly.  After some discussion he will begin to try to utilize these on a daily basis in addition to elevating his lower extremities and activity.  Will have the patient return in approximately 3 months or sooner if issues arise.  2. Primary hypertension Continue antihypertensive medications as already ordered, these medications have been reviewed and there are no changes at this time.  However, he is concerned that the verapamil may be causing some of his swelling.  Will allow him to follow-up with his PCP for evaluation of possible changes of medication.  3. Cellulitis of left lower extremity Currently this has resolved   Current Outpatient Medications on File Prior to Visit  Medication Sig Dispense Refill   Ascorbic Acid (VITAMIN C) 100 MG tablet Take 100 mg by mouth daily.     azelastine (ASTELIN) 0.1 % nasal spray Place 2 sprays into both nostrils 2 (two) times daily. Use in each nostril as directed 30 mL 6   B Complex Vitamins (VITAMIN B COMPLEX) TABS      Cholecalciferol (VITAMIN D3) 10 MCG (400 UNIT) CAPS      diphenoxylate-atropine (LOMOTIL) 2.5-0.025 MG tablet Take 2 tablets by mouth 4 (four) times daily as needed for diarrhea or loose stools. 240 tablet 5   hyoscyamine (ANASPAZ) 0.125 MG TBDP disintergrating tablet Place 1 tablet (0.125 mg total) under the tongue every 4 (four) hours as needed. 180 tablet 1   levocetirizine (XYZAL) 5 MG tablet Take 1 tablet (5 mg total)  by mouth every evening.     Multiple Vitamin (MULTIVITAMIN WITH MINERALS) TABS tablet Take 1 tablet by mouth daily.     omeprazole (PRILOSEC) 20 MG capsule Take 1 capsule (20 mg total) by mouth 2 (two) times daily before a meal.     rosuvastatin (CRESTOR) 10 MG tablet Take 1 tablet (10 mg total) by mouth daily. 90 tablet 1   sildenafil (VIAGRA) 50 MG tablet Take 1 tablet (50 mg total) by mouth as needed for erectile dysfunction. 25 tablet 4   verapamil (VERELAN) 360  MG 24 hr capsule TAKE ONE CAPSULE BY MOUTH AT BEDTIME. 90 capsule 1   benzonatate (TESSALON) 100 MG capsule Take 2 capsules (200 mg total) by mouth 2 (two) times daily as needed for cough. 30 capsule 0   colestipol (COLESTID) 1 g tablet Take by mouth.     No current facility-administered medications on file prior to visit.    There are no Patient Instructions on file for this visit. No follow-ups on file.   Georgiana Spinner, NP

## 2023-06-14 ENCOUNTER — Encounter: Payer: Self-pay | Admitting: Family Medicine

## 2023-06-17 ENCOUNTER — Ambulatory Visit (INDEPENDENT_AMBULATORY_CARE_PROVIDER_SITE_OTHER): Payer: Medicare Other | Admitting: Vascular Surgery

## 2023-06-17 VITALS — BP 145/80 | HR 66 | Resp 16 | Ht 67.0 in | Wt 175.8 lb

## 2023-06-17 DIAGNOSIS — I10 Essential (primary) hypertension: Secondary | ICD-10-CM | POA: Diagnosis not present

## 2023-06-17 DIAGNOSIS — E782 Mixed hyperlipidemia: Secondary | ICD-10-CM

## 2023-06-17 DIAGNOSIS — I890 Lymphedema, not elsewhere classified: Secondary | ICD-10-CM

## 2023-06-17 NOTE — Assessment & Plan Note (Signed)
 blood pressure control important in reducing the progression of atherosclerotic disease. On appropriate oral medications.

## 2023-06-17 NOTE — Assessment & Plan Note (Signed)
 lipid control important in reducing the progression of atherosclerotic disease. Continue statin therapy

## 2023-06-17 NOTE — Progress Notes (Signed)
 MRN : [id number]  William Coleman. is a 63 y.o. (November 29, 1960) male who presents with chief complaint of  Chief Complaint  Patient presents with   Follow-up    3 month follow up  .  History of Present Illness: Patient returns today in follow up of his leg swelling and lymphedema.  He had a negative venous study performed a few months ago.  He has been diligently wearing 20 to 30 mmHg compression socks, elevating his legs, and trying to exercise.  He has thickening of the skin and swelling that are persistent despite these measures.  No new ulceration or infection.  No fever or chills.  Current Outpatient Medications  Medication Sig Dispense Refill   Ascorbic Acid  (VITAMIN C ) 100 MG tablet Take 100 mg by mouth daily.     azelastine  (ASTELIN ) 0.1 % nasal spray Place 2 sprays into both nostrils 2 (two) times daily. Use in each nostril as directed 30 mL 6   B Complex Vitamins (VITAMIN B COMPLEX) TABS      Cholecalciferol (VITAMIN D3) 10 MCG (400 UNIT) CAPS      diphenoxylate -atropine  (LOMOTIL ) 2.5-0.025 MG tablet Take 2 tablets by mouth 4 (four) times daily as needed for diarrhea or loose stools. 240 tablet 5   hyoscyamine  (ANASPAZ ) 0.125 MG TBDP disintergrating tablet Place 1 tablet (0.125 mg total) under the tongue every 4 (four) hours as needed. 180 tablet 1   levocetirizine (XYZAL ) 5 MG tablet Take 1 tablet (5 mg total) by mouth every evening.     Multiple Vitamin (MULTIVITAMIN WITH MINERALS) TABS tablet Take 1 tablet by mouth daily.     omeprazole  (PRILOSEC) 20 MG capsule Take 1 capsule (20 mg total) by mouth 2 (two) times daily before a meal.     rosuvastatin  (CRESTOR ) 10 MG tablet Take 1 tablet (10 mg total) by mouth daily. 90 tablet 1   sildenafil  (VIAGRA ) 50 MG tablet Take 1 tablet (50 mg total) by mouth as needed for erectile dysfunction. 25 tablet 4   verapamil  (VERELAN ) 360 MG 24 hr capsule TAKE ONE CAPSULE BY MOUTH AT BEDTIME. 90 capsule 1   benzonatate  (TESSALON ) 100 MG  capsule Take 2 capsules (200 mg total) by mouth 2 (two) times daily as needed for cough. 30 capsule 0   colestipol (COLESTID) 1 g tablet Take by mouth.     No current facility-administered medications for this visit.    Past Medical History:  Diagnosis Date   Allergy     ongoing ENT outpatient treatment   Anancastic neurosis    Anginal pain (HCC)    Anxiety, generalized    Bipolar disorder (HCC)    Chronic diarrhea    Depression    GERD (gastroesophageal reflux disease)    currently taking Prilosec OTC 20 mg   Hypertension    Hypothyroidism    Laryngopharyngeal reflux (LPR) 01/01/2018   Obsessive compulsive disorder    OCD (obsessive compulsive disorder)    Sleep apnea    patient states it was marginal, no CPAP    Past Surgical History:  Procedure Laterality Date   COLONOSCOPY WITH PROPOFOL  N/A 02/05/2016   Procedure: COLONOSCOPY WITH PROPOFOL ;  Surgeon: Rogelia Copping, MD;  Location: [facility];  Service: Endoscopy;  Laterality: N/A;   MOUTH SURGERY     orthodontic surgery for underbite   MYRINGOTOMY WITH TUBE PLACEMENT     NASAL SEPTUM SURGERY     NASAL SINUS SURGERY Bilateral 10/09/2020   POLYPECTOMY N/A 02/05/2016  Procedure: POLYPECTOMY;  Surgeon: Rogelia Copping, MD;  Location: [facility];  Service: Endoscopy;  Laterality: N/A;     Social History   Tobacco Use   Smoking status: Never   Smokeless tobacco: Never   Tobacco comments:    N/A  Vaping Use   Vaping status: Never Used  Substance Use Topics   Alcohol use: Not Currently    Comment: Rarely   Drug use: Never      Family History  Problem Relation Age of Onset   Depression Mother    Anxiety disorder Mother    Congestive Heart Failure Father    Prostate cancer Father    Hypertension Father    Hypertension Sister      Allergies  Allergen Reactions   Penicillins Hives and Rash   Sulfa Antibiotics Hives   Amoxicillin Hives     REVIEW OF SYSTEMS (Negative unless  checked)  Constitutional: [] Weight loss  [] Fever  [] Chills Cardiac: [] Chest pain   [] Chest pressure   [] Palpitations   [] Shortness of breath when laying flat   [] Shortness of breath at rest   [] Shortness of breath with exertion. Vascular:  [] Pain in legs with walking   [] Pain in legs at rest   [] Pain in legs when laying flat   [] Claudication   [] Pain in feet when walking  [] Pain in feet at rest  [] Pain in feet when laying flat   [] History of DVT   [] Phlebitis   [x] Swelling in legs   [] Varicose veins   [] Non-healing ulcers Pulmonary:   [] Uses home oxygen   [] Productive cough   [] Hemoptysis   [] Wheeze  [] COPD   [] Asthma Neurologic:  [] Dizziness  [] Blackouts   [] Seizures   [] History of stroke   [] History of TIA  [] Aphasia   [] Temporary blindness   [] Dysphagia   [] Weakness or numbness in arms   [] Weakness or numbness in legs Musculoskeletal:  [] Arthritis   [] Joint swelling   [x] Joint pain   [] Low back pain Hematologic:  [] Easy bruising  [] Easy bleeding   [] Hypercoagulable state   [] Anemic   Gastrointestinal:  [] Blood in stool   [] Vomiting blood  [] Gastroesophageal reflux/heartburn   [] Abdominal pain Genitourinary:  [] Chronic kidney disease   [] Difficult urination  [] Frequent urination  [] Burning with urination   [] Hematuria Skin:  [] Rashes   [] Ulcers   [] Wounds Psychological:  [] History of anxiety   []  History of major depression.  Physical Examination  BP (!) 145/80   Pulse 66   Resp 16   Ht 5' 7 (1.702 m)   Wt 175 lb 12.8 oz (79.7 kg)   BMI 27.53 kg/m  Gen:  WD/WN, NAD Head: [place]/AT, No temporalis wasting. Ear/Nose/Throat: Hearing grossly intact, nares w/o erythema or drainage Eyes: Conjunctiva clear. Sclera non-icteric Neck: Supple.  Trachea midline Pulmonary:  Good air movement, no use of accessory muscles.  Cardiac: RRR, no JVD Vascular:  Vessel Right Left  Radial Palpable Palpable                          PT Palpable Palpable  DP Palpable Palpable   Gastrointestinal: soft,  non-tender/non-distended. No guarding/reflex.  Musculoskeletal: M/S 5/5 throughout.  No deformity or atrophy. 1+ BLE edema. Neurologic: Sensation grossly intact in extremities.  Symmetrical.  Speech is fluent.  Psychiatric: Judgment intact, Mood & affect appropriate for pt's clinical situation. Dermatologic: No rashes or ulcers noted.  No cellulitis or open wounds.      Labs No results found for  this or any previous visit (from the past 2160 hours).  Radiology No results found.  Assessment/Plan  Lymphedema Despite appropriate conservative therapies including 20-30 mmHg compression stockings, exercise, and increased leg elevation the patient continues to have symptoms of stage 2 lymphedema. The symptoms are significant and bothersome on a daily basis. At this point, I think a lymphedema pump would be an excellent adjuvant therapy to try to improve their symptoms. I have discussed the reason and rationale for the lymphedema pump. This would be in addition to the conservative therapies and not to replace them. I will plan to see the patient back in several months to assess their response to the addition of the lymphedema pump as well as continued conservative therapy.  HLD (hyperlipidemia) lipid control important in reducing the progression of atherosclerotic disease. Continue statin therapy   HTN (hypertension) blood pressure control important in reducing the progression of atherosclerotic disease. On appropriate oral medications.    Selinda Gu, MD  06/17/2023 3:51 PM    This note was created with Dragon medical transcription system.  Any errors from dictation are purely unintentional

## 2023-06-17 NOTE — Assessment & Plan Note (Signed)
Despite appropriate conservative therapies including 20-30 mmHg compression stockings, exercise, and increased leg elevation the patient continues to have symptoms of stage 2 lymphedema. The symptoms are significant and bothersome on a daily basis. At this point, I think a lymphedema pump would be an excellent adjuvant therapy to try to improve their symptoms. I have discussed the reason and rationale for the lymphedema pump. This would be in addition to the conservative therapies and not to replace them. I will plan to see the patient back in several months to assess their response to the addition of the lymphedema pump as well as continued conservative therapy.

## 2023-06-18 ENCOUNTER — Ambulatory Visit: Payer: Medicare Other | Admitting: Family Medicine

## 2023-06-20 ENCOUNTER — Ambulatory Visit (INDEPENDENT_AMBULATORY_CARE_PROVIDER_SITE_OTHER): Payer: Medicare Other | Admitting: Family Medicine

## 2023-06-20 ENCOUNTER — Encounter: Payer: Self-pay | Admitting: Family Medicine

## 2023-06-20 VITALS — BP 147/94 | HR 66 | Wt 177.5 lb

## 2023-06-20 DIAGNOSIS — Z0000 Encounter for general adult medical examination without abnormal findings: Secondary | ICD-10-CM | POA: Insufficient documentation

## 2023-06-20 DIAGNOSIS — N5203 Combined arterial insufficiency and corporo-venous occlusive erectile dysfunction: Secondary | ICD-10-CM

## 2023-06-20 DIAGNOSIS — Z131 Encounter for screening for diabetes mellitus: Secondary | ICD-10-CM

## 2023-06-20 DIAGNOSIS — I10 Essential (primary) hypertension: Secondary | ICD-10-CM | POA: Diagnosis not present

## 2023-06-20 DIAGNOSIS — Z8042 Family history of malignant neoplasm of prostate: Secondary | ICD-10-CM

## 2023-06-20 DIAGNOSIS — E663 Overweight: Secondary | ICD-10-CM | POA: Diagnosis not present

## 2023-06-20 DIAGNOSIS — E782 Mixed hyperlipidemia: Secondary | ICD-10-CM | POA: Diagnosis not present

## 2023-06-20 DIAGNOSIS — F429 Obsessive-compulsive disorder, unspecified: Secondary | ICD-10-CM

## 2023-06-20 DIAGNOSIS — R0982 Postnasal drip: Secondary | ICD-10-CM

## 2023-06-20 DIAGNOSIS — Z6826 Body mass index (BMI) 26.0-26.9, adult: Secondary | ICD-10-CM

## 2023-06-20 DIAGNOSIS — Z130 Encounter for screening for diseases of the blood and blood-forming organs and certain disorders involving the immune mechanism: Secondary | ICD-10-CM

## 2023-06-20 DIAGNOSIS — J3089 Other allergic rhinitis: Secondary | ICD-10-CM

## 2023-06-20 NOTE — Progress Notes (Signed)
 Complete physical exam   Patient: Jyles Sontag.   DOB: 1960/07/14   63 y.o. Male  MRN: [id number] Visit Date: 06/20/2023  Today's healthcare provider: Rockie Agent, MD   Chief Complaint  Patient presents with   Annual Exam    Diet -  General, fairly healthy Exercise- none  Feeling- well Sleeping- well Concerns- would like to know if he can be prescribed XYZAL  10 mg and wants to know if he can receive RSV vaccination as he was advised by [facility] he did not qualify to get one   Subjective    Carlin Lorel Lyncoln Ledgerwood. is a 63 y.o. male who presents today for a complete physical exam.   He reports consuming a general diet.   The patient does not participate in regular exercise at present.   He generally feels well.   He reports sleeping well.    He does not have additional problems to discuss today.   Discussed the use of AI scribe software for clinical note transcription with the patient, who gave verbal consent to proceed.  History of Present Illness   The patient, a 63 year old individual with a history of hypertension and hyperlipidemia, presents for an annual physical. The patient reports a recent increase in blood pressure despite adherence to prescribed verapamil  360mg  daily. He expresses concern about the potential cost increase of verapamil  in the upcoming year and inquires about possible alternatives. However, the patient has a history of adverse reactions to amlodipine , which resulted in leg swelling and a rash, and a previous unspecified medication that caused a cough.  The patient also reports a history of chronic sinus issues, currently managed with over-the-counter Xyzal  5mg . He inquires about the potential benefits of increasing the dose to 10mg , but no changes were made at this time.  The patient has a history of leg swelling, which has significantly improved recently. He denies any current swelling and reports that previous  investigations, including venous ultrasounds and blood work, have not identified a cause.  The patient acknowledges a lack of regular exercise due to a busy schedule and has noticed weight gain. He expresses a desire to incorporate more physical activity into his routine.  The patient also mentions a family history of prostate cancer and is due for a Prostate-Specific Antigen (PSA) test. He has a Body Mass Index (BMI) of 27, indicating overweight status.  Lastly, the patient inquires about the use of Prevagen for memory preservation, but no changes were made to his regimen at this time.         Past Medical History:  Diagnosis Date   Allergy     ongoing ENT outpatient treatment   Anancastic neurosis    Anginal pain (HCC)    Anxiety, generalized    Bipolar disorder (HCC)    Chronic diarrhea    Depression    GERD (gastroesophageal reflux disease)    currently taking Prilosec OTC 20 mg   Hypertension    Hypothyroidism    Laryngopharyngeal reflux (LPR) 01/01/2018   Obsessive compulsive disorder    OCD (obsessive compulsive disorder)    Sleep apnea    patient states it was marginal, no CPAP   Past Surgical History:  Procedure Laterality Date   COLONOSCOPY WITH PROPOFOL  N/A 02/05/2016   Procedure: COLONOSCOPY WITH PROPOFOL ;  Surgeon: Rogelia Copping, MD;  Location: [facility];  Service: Endoscopy;  Laterality: N/A;   MOUTH SURGERY     orthodontic surgery for underbite   MYRINGOTOMY WITH  TUBE PLACEMENT     NASAL SEPTUM SURGERY     NASAL SINUS SURGERY Bilateral 10/09/2020   POLYPECTOMY N/A 02/05/2016   Procedure: POLYPECTOMY;  Surgeon: Rogelia Copping, MD;  Location: [facility];  Service: Endoscopy;  Laterality: N/A;   Social History   Socioeconomic History   Marital status: Single    Spouse name: Not on file   Number of children: 0   Years of education: Not on file   Highest education level: Some college, no degree  Occupational History   Not on file  Tobacco  Use   Smoking status: Never   Smokeless tobacco: Never   Tobacco comments:    N/A  Vaping Use   Vaping status: Never Used  Substance and Sexual Activity   Alcohol use: Not Currently    Comment: Rarely   Drug use: Never   Sexual activity: Not Currently    Birth control/protection: Condom  Other Topics Concern   Not on file  Social History Narrative   Not on file   Social Drivers of Health   Financial Resource Strain: Low Risk  (06/13/2023)   Overall Financial Resource Strain (CARDIA)    Difficulty of Paying Living Expenses: Not very hard  Food Insecurity: No Food Insecurity (06/13/2023)   Hunger Vital Sign    Worried About Running Out of Food in the Last Year: Never true    Ran Out of Food in the Last Year: Never true  Transportation Needs: No Transportation Needs (06/13/2023)   PRAPARE - Administrator, Civil Service (Medical): No    Lack of Transportation (Non-Medical): No  Physical Activity: Insufficiently Active (06/13/2023)   Exercise Vital Sign    Days of Exercise per Week: 1 day    Minutes of Exercise per Session: 10 min  Stress: No Stress Concern Present (06/13/2023)   [employer] of Occupational Health - Occupational Stress Questionnaire    Feeling of Stress : Not at all  Social Connections: Moderately Integrated (06/13/2023)   Social Connection and Isolation Panel [NHANES]    Frequency of Communication with Friends and Family: More than three times a week    Frequency of Social Gatherings with Friends and Family: Three times a week    Attends Religious Services: More than 4 times per year    Active Member of Clubs or Organizations: Yes    Attends Banker Meetings: More than 4 times per year    Marital Status: Never married  Intimate Partner Violence: Not At Risk (12/23/2022)   Humiliation, Afraid, Rape, and Kick questionnaire    Fear of Current or Ex-Partner: No    Emotionally Abused: No    Physically Abused: No    Sexually Abused: No    Family Status  Relation Name Status   Mother Rooney Swails Deceased   Father Carlin Niece, Sr. Deceased   Sister  Alive   Sister Fiore Detjen (Not Specified)  No partnership data on file   Family History  Problem Relation Age of Onset   Depression Mother    Anxiety disorder Mother    Congestive Heart Failure Father    Prostate cancer Father    Hypertension Father    Hypertension Sister    Allergies  Allergen Reactions   Penicillins Hives and Rash   Sulfa Antibiotics Hives   Amoxicillin Hives     Medications: Outpatient Medications Prior to Visit  Medication Sig   Ascorbic Acid  (VITAMIN C ) 100 MG tablet Take 100 mg by  mouth daily.   azelastine  (ASTELIN ) 0.1 % nasal spray Place 2 sprays into both nostrils 2 (two) times daily. Use in each nostril as directed   B Complex Vitamins (VITAMIN B COMPLEX) TABS    Cholecalciferol (VITAMIN D3) 10 MCG (400 UNIT) CAPS    diphenoxylate -atropine  (LOMOTIL ) 2.5-0.025 MG tablet Take 2 tablets by mouth 4 (four) times daily as needed for diarrhea or loose stools.   hyoscyamine  (ANASPAZ ) 0.125 MG TBDP disintergrating tablet Place 1 tablet (0.125 mg total) under the tongue every 4 (four) hours as needed.   levocetirizine (XYZAL ) 5 MG tablet Take 1 tablet (5 mg total) by mouth every evening.   Multiple Vitamin (MULTIVITAMIN WITH MINERALS) TABS tablet Take 1 tablet by mouth daily.   omeprazole  (PRILOSEC) 20 MG capsule Take 1 capsule (20 mg total) by mouth 2 (two) times daily before a meal.   rosuvastatin  (CRESTOR ) 10 MG tablet Take 1 tablet (10 mg total) by mouth daily.   sildenafil  (VIAGRA ) 50 MG tablet Take 1 tablet (50 mg total) by mouth as needed for erectile dysfunction.   verapamil  (VERELAN ) 360 MG 24 hr capsule TAKE ONE CAPSULE BY MOUTH AT BEDTIME.   [DISCONTINUED] benzonatate  (TESSALON ) 100 MG capsule Take 2 capsules (200 mg total) by mouth 2 (two) times daily as needed for cough.   [DISCONTINUED] colestipol (COLESTID) 1 g  tablet Take by mouth.   No facility-administered medications prior to visit.    Review of Systems  Last CBC Lab Results  Component Value Date   WBC 6.5 04/25/2021   HGB 16.0 04/25/2021   HCT 46.2 04/25/2021   MCV 90 04/25/2021   MCH 31.3 04/25/2021   RDW 12.5 04/25/2021   PLT 250 04/25/2021   Last metabolic panel Lab Results  Component Value Date   GLUCOSE 82 05/14/2022   NA 140 05/14/2022   K 3.8 05/14/2022   CL 100 05/14/2022   CO2 25 05/14/2022   BUN 16 05/14/2022   CREATININE 1.07 05/14/2022   EGFR 79 05/14/2022   CALCIUM  9.2 05/14/2022   PROT 6.7 05/14/2022   ALBUMIN 4.5 05/14/2022   LABGLOB 2.2 05/14/2022   AGRATIO 2.0 05/14/2022   BILITOT 0.7 05/14/2022   ALKPHOS 66 05/14/2022   AST 27 05/14/2022   ALT 26 05/14/2022   ANIONGAP 11 11/24/2018   Last lipids Lab Results  Component Value Date   CHOL 151 05/14/2022   HDL 47 05/14/2022   LDLCALC 78 05/14/2022   TRIG 148 05/14/2022   CHOLHDL 3.2 05/14/2022   Last hemoglobin A1c Lab Results  Component Value Date   HGBA1C 5.3 05/14/2022   Last thyroid  functions Lab Results  Component Value Date   TSH 0.730 05/14/2022        Objective    BP (!) 147/94 (BP Location: Right Arm, Patient Position: Sitting, Cuff Size: Normal)   Pulse 66   Wt 177 lb 8 oz (80.5 kg)   SpO2 98%   BMI 27.80 kg/m  BP Readings from Last 3 Encounters:  06/20/23 (!) 147/94  06/17/23 (!) 145/80  03/06/23 139/86   Wt Readings from Last 3 Encounters:  06/20/23 177 lb 8 oz (80.5 kg)  06/17/23 175 lb 12.8 oz (79.7 kg)  03/06/23 174 lb (78.9 kg)        Physical Exam Vitals reviewed.  Constitutional:      General: He is not in acute distress.    Appearance: Normal appearance. He is not ill-appearing, toxic-appearing or diaphoretic.  HENT:  Head: Normocephalic and atraumatic.     Right Ear: Tympanic membrane and external ear normal.     Left Ear: Tympanic membrane and external ear normal.     Nose: Nose normal.      Mouth/Throat:     Mouth: Mucous membranes are moist.     Pharynx: No oropharyngeal exudate or posterior oropharyngeal erythema.     Comments: POST NASAL DRIP VISUALIZED IN OROPHARYNX Eyes:     General: No scleral icterus.    Extraocular Movements: Extraocular movements intact.     Conjunctiva/sclera: Conjunctivae normal.     Pupils: Pupils are equal, round, and reactive to light.  Neck:     Vascular: No carotid bruit.  Cardiovascular:     Rate and Rhythm: Normal rate and regular rhythm.     Pulses: Normal pulses.     Heart sounds: Normal heart sounds. No murmur heard.    No friction rub. No gallop.  Pulmonary:     Effort: Pulmonary effort is normal. No respiratory distress.     Breath sounds: Normal breath sounds. No stridor. No wheezing, rhonchi or rales.  Abdominal:     General: Bowel sounds are normal. There is no distension.     Palpations: Abdomen is soft.     Tenderness: There is no abdominal tenderness.  Musculoskeletal:        General: No swelling, tenderness or signs of injury. Normal range of motion.     Cervical back: Normal range of motion and neck supple. No rigidity or tenderness.     Right lower leg: No edema.     Left lower leg: No edema.  Lymphadenopathy:     Cervical: No cervical adenopathy.  Skin:    General: Skin is warm and dry.     Capillary Refill: Capillary refill takes less than 2 seconds.     Findings: No erythema or rash.  Neurological:     General: No focal deficit present.     Mental Status: He is alert and oriented to person, place, and time.     Cranial Nerves: Cranial nerves 2-12 are intact.     Sensory: Sensation is intact.     Motor: Motor function is intact. No weakness, tremor or abnormal muscle tone.     Gait: Gait normal.  Psychiatric:        Attention and Perception: Attention normal.        Mood and Affect: Mood normal.        Speech: Speech normal.        Behavior: Behavior normal. Behavior is cooperative.        Thought  Content: Thought content normal.       Last depression screening scores    12/23/2022    3:05 PM 12/16/2022    2:58 PM 08/15/2022    2:35 PM  PHQ 2/9 Scores  PHQ - 2 Score 0 0 0  PHQ- 9 Score   0    Last fall risk screening    12/19/2022   10:23 AM  Fall Risk   Falls in the past year? 0  Number falls in past yr: 0  Injury with Fall? 0  Follow up Falls prevention discussed;Education provided    Last Audit-C alcohol use screening    06/13/2023    8:58 PM  Alcohol Use Disorder Test (AUDIT)  1. How often do you have a drink containing alcohol? 0  2. How many drinks containing alcohol do you have on a typical day when you  are drinking? 0  3. How often do you have six or more drinks on one occasion? 0  AUDIT-C Score 0      Patient-reported   A score of 3 or more in women, and 4 or more in men indicates increased risk for alcohol abuse, EXCEPT if all of the points are from question 1   No results found for any visits on 06/20/23.  Assessment & Plan    Routine Health Maintenance and Physical Exam  Immunization History  Administered Date(s) Administered   Hep A / Hep B 07/24/2007, 08/21/2007, 02/12/2008   Hepatitis A 07/24/2007, 08/21/2007, 02/12/2008   Hepatitis A, Adult 07/24/2007, 08/21/2007, 02/12/2008   Hepatitis B 07/24/2007, 07/24/2007, 08/21/2007, 08/21/2007, 02/12/2008, 02/12/2008   Influenza Split 03/23/2010, 02/26/2016   Influenza,inj,Quad PF,6+ Mos 03/07/2014, 03/10/2017, 02/22/2019, 03/13/2020, 03/15/2022   Influenza-Unspecified 05/13/1997, 05/11/2018, 02/23/2019, 03/13/2020, 03/14/2021   PFIZER Comirnaty(Gray Top)Covid-19 Tri-Sucrose Vaccine 04/11/2022   PFIZER(Purple Top)SARS-COV-2 Vaccination 11/30/2020   Pfizer Covid-19 Vaccine Bivalent Booster 29yrs & up 03/30/2021   Pfizer(Comirnaty)Fall Seasonal Vaccine 12 years and older 09/16/2019, 10/07/2019, 06/19/2020   Td 07/02/2017, 07/02/2017   Tdap 07/24/2007    Health Maintenance  Topic Date Due   Zoster  Vaccines- Shingrix (1 of 2) Never done   COVID-19 Vaccine (7 - 2024-25 season) 02/09/2023   Medicare Annual Wellness (AWV)  12/23/2023   DTaP/Tdap/Td (4 - Td or Tdap) 07/03/2027   Colonoscopy  12/04/2031   INFLUENZA VACCINE  Completed   Hepatitis C Screening  Completed   HIV Screening  Completed   HPV VACCINES  Aged Out    Problem List Items Addressed This Visit       Cardiovascular and Mediastinum   HTN (hypertension)   Hypertension managed with verapamil  360 mg daily. Blood pressure readings elevated, possibly due to lack of exercise and weight gain.  Discussed potential cost increase of verapamil  and alternative medications. Previous leg swelling with amlodipine  and cough with losartan  or lisinopril. Continuing verapamil  due to effectiveness and lack of side effects. - Continue verapamil  360 mg daily, may replace with 360mg  diltiazem  because it is lower tier drug  - Recheck blood pressure before leaving - Follow-up in 1-1.5 months if blood pressure remains high - Encourage 30 minutes of exercise five days a week      Relevant Orders   CMP14+EGFR   TSH+T4F+T3Free   Combined arterial insufficiency and corporo-venous occlusive erectile dysfunction     Respiratory   Non-seasonal allergic rhinitis     Other   PND (post-nasal drip)   Overweight with body mass index (BMI) of 26 to 26.9 in adult   Relevant Orders   CMP14+EGFR   Lipid panel   Hemoglobin A1c   OCD (obsessive compulsive disorder)   HLD (hyperlipidemia)   Relevant Orders   Lipid panel   Family hx of prostate cancer   Relevant Orders   PSA, total and free   Annual physical exam - Primary   Chronic conditions are stable  Patient was counseled on benefits of regular physical activity with goal of 150 minutes of moderate to vigurous intensity 4 days per week  Patient was counseled to consume well balanced diet of fruits, vegetables, limited saturated fats and limited sugary foods and beverages with emphasis on  consuming 6-8 glasses of water  daily  Screening recommended today: A1c, lipids,CMP,CBC, PSA (fam hx of Prostate CA)  Colon cancer screening: UTD, last in 2017    Vaccines recommended today: Shingrix       Other  Visit Diagnoses       Screening for deficiency anemia       Relevant Orders   CBC     Screening for diabetes mellitus       Relevant Orders   Hemoglobin A1c        Hyperlipidemia Hyperlipidemia requiring annual lipid panel screening. - Order lipid panel  Chronic Sinusitis Chronic sinusitis managed with Xyzal  5 mg daily. Discussed potential increase to 10 mg, but no 10 mg tablet available. Recommended to continue with 5 mg. 10 mg typically prescribed for severe allergic reactions, not chronic sinusitis. - Continue Xyzal  5 mg daily  General Health Maintenance Discussed RSV vaccine eligibility based on CDC guidelines. Does not meet criteria due to lack of comorbid conditions such as heart failure, diabetes, COPD, or liver failure. - No RSV vaccine at this time   Return in about 6 weeks (around 08/01/2023) for HTN.       Rockie Agent, MD  [facility] [phone] (phone) [phone] (fax)  [facility]

## 2023-06-20 NOTE — Patient Instructions (Signed)
 It was a pleasure to see you today!  Thank you for choosing [facility] for your primary care.   Today you were seen for your annual physical  Please review the attached information regarding helpful preventive health topics.   To keep you healthy, please keep in mind the following health maintenance items that you are due for:   1.Shingrix vaccine    Best Wishes,   Dr. Lang

## 2023-06-20 NOTE — Assessment & Plan Note (Signed)
 Chronic conditions are stable  Patient was counseled on benefits of regular physical activity with goal of 150 minutes of moderate to vigurous intensity 4 days per week  Patient was counseled to consume well balanced diet of fruits, vegetables, limited saturated fats and limited sugary foods and beverages with emphasis on consuming 6-8 glasses of water  daily  Screening recommended today: A1c, lipids,CMP,CBC, PSA (fam hx of Prostate CA)  Colon cancer screening: UTD, last in 2017    Vaccines recommended today: Shingrix

## 2023-06-21 ENCOUNTER — Encounter: Payer: Self-pay | Admitting: Family Medicine

## 2023-06-21 LAB — CBC
Hematocrit: 46.7 % (ref 37.5–51.0)
Hemoglobin: 16.2 g/dL (ref 13.0–17.7)
MCH: 31.7 pg (ref 26.6–33.0)
MCHC: 34.7 g/dL (ref 31.5–35.7)
MCV: 91 fL (ref 79–97)
Platelets: 268 10*3/uL (ref 150–450)
RBC: 5.11 x10E6/uL (ref 4.14–5.80)
RDW: 12.2 % (ref 11.6–15.4)
WBC: 5.9 10*3/uL (ref 3.4–10.8)

## 2023-06-21 LAB — TSH+T4F+T3FREE
Free T4: 1.26 ng/dL (ref 0.82–1.77)
T3, Free: 4.4 pg/mL (ref 2.0–4.4)
TSH: 0.595 u[IU]/mL (ref 0.450–4.500)

## 2023-06-21 LAB — CMP14+EGFR
ALT: 29 [IU]/L (ref 0–44)
AST: 30 [IU]/L (ref 0–40)
Albumin: 4.7 g/dL (ref 3.9–4.9)
Alkaline Phosphatase: 91 [IU]/L (ref 44–121)
BUN/Creatinine Ratio: 15 (ref 10–24)
BUN: 16 mg/dL (ref 8–27)
Bilirubin Total: 0.7 mg/dL (ref 0.0–1.2)
CO2: 25 mmol/L (ref 20–29)
Calcium: 9.3 mg/dL (ref 8.6–10.2)
Chloride: 101 mmol/L (ref 96–106)
Creatinine, Ser: 1.04 mg/dL (ref 0.76–1.27)
Globulin, Total: 2.6 g/dL (ref 1.5–4.5)
Glucose: 90 mg/dL (ref 70–99)
Potassium: 3.9 mmol/L (ref 3.5–5.2)
Sodium: 140 mmol/L (ref 134–144)
Total Protein: 7.3 g/dL (ref 6.0–8.5)
eGFR: 82 mL/min/{1.73_m2} (ref >59)

## 2023-06-21 LAB — LIPID PANEL
Chol/HDL Ratio: 3.3 {ratio} (ref 0.0–5.0)
Cholesterol, Total: 153 mg/dL (ref 100–199)
HDL: 46 mg/dL (ref >39)
LDL Chol Calc (NIH): 85 mg/dL (ref 0–99)
Triglycerides: 122 mg/dL (ref 0–149)
VLDL Cholesterol Cal: 22 mg/dL (ref 5–40)

## 2023-06-21 LAB — PSA, TOTAL AND FREE
PSA, Free Pct: 36 %
PSA, Free: 0.54 ng/mL
Prostate Specific Ag, Serum: 1.5 ng/mL (ref 0.0–4.0)

## 2023-06-21 LAB — HEMOGLOBIN A1C
Est. average glucose Bld gHb Est-mCnc: 114 mg/dL
Hgb A1c MFr Bld: 5.6 % (ref 4.8–5.6)

## 2023-06-21 NOTE — Assessment & Plan Note (Signed)
 Hypertension managed with verapamil  360 mg daily. Blood pressure readings elevated, possibly due to lack of exercise and weight gain.  Discussed potential cost increase of verapamil  and alternative medications. Previous leg swelling with amlodipine  and cough with losartan  or lisinopril. Continuing verapamil  due to effectiveness and lack of side effects. - Continue verapamil  360 mg daily, may replace with 360mg  diltiazem  because it is lower tier drug  - Recheck blood pressure before leaving - Follow-up in 1-1.5 months if blood pressure remains high - Encourage 30 minutes of exercise five days a week

## 2023-06-25 ENCOUNTER — Other Ambulatory Visit: Payer: Self-pay | Admitting: Family Medicine

## 2023-06-25 MED ORDER — DILTIAZEM HCL ER COATED BEADS 360 MG PO CP24: 360.0000 mg | ORAL_CAPSULE | Freq: Every day | ORAL | 3 refills | Status: DC

## 2023-06-25 NOTE — Telephone Encounter (Signed)
 Diltiazem  360mg  daily sent to patient's pharmacy to replace verapamil . Will leave both on med list until follow up in Feb 2025 to assess tolerance of diltiazem .   Mimi Alt, MD

## 2023-07-26 ENCOUNTER — Other Ambulatory Visit: Payer: Self-pay | Admitting: Family Medicine

## 2023-07-26 DIAGNOSIS — E782 Mixed hyperlipidemia: Secondary | ICD-10-CM

## 2023-07-28 ENCOUNTER — Telehealth: Payer: Self-pay | Admitting: Family Medicine

## 2023-07-28 NOTE — Telephone Encounter (Signed)
Requested Prescriptions  Pending Prescriptions Disp Refills   rosuvastatin (CRESTOR) 10 MG tablet [Pharmacy Med Name: ROSUVASTATIN CALCIUM 10 MG TAB] 90 tablet 1    Sig: TAKE 1 TABLET BY MOUTH DAILY     Cardiovascular:  Antilipid - Statins 2 Failed - 07/28/2023 10:18 AM      Failed - Lipid Panel in normal range within the last 12 months    Cholesterol, Total  Date Value Ref Range Status  06/20/2023 153 100 - 199 mg/dL Final   LDL Chol Calc (NIH)  Date Value Ref Range Status  06/20/2023 85 0 - 99 mg/dL Final   HDL  Date Value Ref Range Status  06/20/2023 46 >39 mg/dL Final   Triglycerides  Date Value Ref Range Status  06/20/2023 122 0 - 149 mg/dL Final         Passed - Cr in normal range and within 360 days    Creatinine, Ser  Date Value Ref Range Status  06/20/2023 1.04 0.76 - 1.27 mg/dL Final         Passed - Patient is not pregnant      Passed - Valid encounter within last 12 months    Recent Outpatient Visits           1 month ago Annual physical exam   [place] [facility] Simmons-Robinson, Tawanna Cooler, MD   7 months ago Subacute pansinusitis   [place] [facility] Simmons-Robinson, [place], MD   11 months ago Primary hypertension   [place] [facility] Simmons-Robinson, [place], MD   1 year ago Primary hypertension   [place] [facility] Simmons-Robinson, [place], MD   1 year ago Primary hypertension   [place] [facility] Simmons-Robinson, [place], MD       Future Appointments             In 4 days Simmons-Robinson, Tawanna Cooler, MD [facility]

## 2023-07-28 NOTE — Telephone Encounter (Signed)
Medication Refill -  Most Recent Primary Care Visit:  Provider: Ronnald Ramp  Department: [id number]  Visit Type: PHYSICAL  Date: 06/20/2023  Medication: rosuvastatin (CRESTOR) 10 MG tablet  Pt requested via pharmacy on 07/26/23. Pt requesting request be expedited if possible. Pt would like to pick up medication by Wednesday before storm. Pt advised of turnaround time.  Has the patient contacted their pharmacy? Yes  Is this the correct pharmacy for this prescription? Yes This is the patient's preferred pharmacy:  [facility] - [place], [place] - [address] [place] [address] Phone: [phone] Fax: [phone]   Has the prescription been filled recently? No  Is the patient out of the medication? No  Has the patient been seen for an appointment in the last year OR does the patient have an upcoming appointment? Yes  Can we respond through MyChart? Yes  Agent: Please be advised that Rx refills may take up to 3 business days. We ask that you follow-up with your pharmacy.

## 2023-07-28 NOTE — Telephone Encounter (Signed)
Sent in today in a separate refill encounter from the pharmacy on 07/26/23.

## 2023-07-30 DIAGNOSIS — I890 Lymphedema, not elsewhere classified: Secondary | ICD-10-CM | POA: Diagnosis not present

## 2023-08-01 ENCOUNTER — Ambulatory Visit (INDEPENDENT_AMBULATORY_CARE_PROVIDER_SITE_OTHER): Payer: Medicare Other | Admitting: Family Medicine

## 2023-08-01 ENCOUNTER — Encounter: Payer: Self-pay | Admitting: Family Medicine

## 2023-08-01 VITALS — BP 134/85 | HR 73 | Ht 67.0 in | Wt 169.0 lb

## 2023-08-01 DIAGNOSIS — J3089 Other allergic rhinitis: Secondary | ICD-10-CM | POA: Diagnosis not present

## 2023-08-01 DIAGNOSIS — R0982 Postnasal drip: Secondary | ICD-10-CM | POA: Diagnosis not present

## 2023-08-01 DIAGNOSIS — I491 Atrial premature depolarization: Secondary | ICD-10-CM | POA: Diagnosis not present

## 2023-08-01 DIAGNOSIS — R9431 Abnormal electrocardiogram [ECG] [EKG]: Secondary | ICD-10-CM

## 2023-08-01 DIAGNOSIS — I493 Ventricular premature depolarization: Secondary | ICD-10-CM | POA: Diagnosis not present

## 2023-08-01 DIAGNOSIS — E782 Mixed hyperlipidemia: Secondary | ICD-10-CM

## 2023-08-01 DIAGNOSIS — I10 Essential (primary) hypertension: Secondary | ICD-10-CM | POA: Diagnosis not present

## 2023-08-01 MED ORDER — LEVOCETIRIZINE DIHYDROCHLORIDE 5 MG PO TABS: 5.0000 mg | ORAL_TABLET | Freq: Two times a day (BID) | ORAL | 3 refills | Status: DC

## 2023-08-01 NOTE — Progress Notes (Signed)
Established patient visit   Patient: William Coleman.   DOB: [birth year]   63 y.o. Male  MRN: [id number] Visit Date: 08/01/2023  Today's healthcare provider: Ronnald Ramp, MD   Chief Complaint  Patient presents with   Hypertension   Subjective       Discussed the use of AI scribe software for clinical note transcription with the patient, who gave verbal consent to proceed.  History of Present Illness   William Coleman. is a 63 year old male with hypertension who presents for a follow-up visit.  He is managing hypertension with diltiazem 360 mg daily, having switched from verapamil due to insurance reasons. His blood pressure has improved to 134/85 from 147/94 at his previous visit, and he experiences no side effects from diltiazem.  He recalls an EKG done five to ten years ago that may have shown a slight arrhythmia. He is unsure of the details but notes a family history of atrial fibrillation in both parents, who also had high blood pressure. He has not been diagnosed with atrial fibrillation. No chest pain, shortness of breath, or palpitations.  He takes Prilosec 20 mg twice daily for gastroesophageal reflux disease, which he describes as 'massive' at times. He has been on this regimen for several years without issues.  He is taking Xyzal 5 mg daily for sinus issues and inquired about increasing the dose to 10 mg, as informed by a pharmacist. He has not experienced any allergic reactions to medications except for amoxicillin, penicillin, and sulfa drugs.  He mentioned considering Prevagen for memory issues but has not started it. He attributes occasional forgetfulness to age and his busy schedule with volunteer work.      Past Medical History:  Diagnosis Date   Allergy    ongoing ENT outpatient treatment   Anancastic neurosis    Anginal pain (HCC)    Anxiety, generalized    Bipolar disorder (HCC)    Chronic diarrhea    Depression     GERD (gastroesophageal reflux disease)    currently taking Prilosec OTC 20 mg   Hypertension    Hypothyroidism    Laryngopharyngeal reflux (LPR) 01/01/2018   Obsessive compulsive disorder    OCD (obsessive compulsive disorder)    Sleep apnea    patient states it was "marginal", no CPAP    Medications: Outpatient Medications Prior to Visit  Medication Sig   Ascorbic Acid (VITAMIN C) 100 MG tablet Take 100 mg by mouth daily.   azelastine (ASTELIN) 0.1 % nasal spray Place 2 sprays into both nostrils 2 (two) times daily. Use in each nostril as directed   B Complex Vitamins (VITAMIN B COMPLEX) TABS    Cholecalciferol (VITAMIN D3) 10 MCG (400 UNIT) CAPS    diltiazem (CARDIZEM CD) 360 MG 24 hr capsule Take 1 capsule (360 mg total) by mouth daily.   diphenoxylate-atropine (LOMOTIL) 2.5-0.025 MG tablet Take 2 tablets by mouth 4 (four) times daily as needed for diarrhea or loose stools.   hyoscyamine (ANASPAZ) 0.125 MG TBDP disintergrating tablet Place 1 tablet (0.125 mg total) under the tongue every 4 (four) hours as needed.   Multiple Vitamin (MULTIVITAMIN WITH MINERALS) TABS tablet Take 1 tablet by mouth daily.   omeprazole (PRILOSEC) 20 MG capsule Take 1 capsule (20 mg total) by mouth 2 (two) times daily before a meal.   rosuvastatin (CRESTOR) 10 MG tablet TAKE 1 TABLET BY MOUTH DAILY   sildenafil (VIAGRA) 50 MG tablet Take 1  tablet (50 mg total) by mouth as needed for erectile dysfunction.   [DISCONTINUED] levocetirizine (XYZAL) 5 MG tablet Take 1 tablet (5 mg total) by mouth every evening.   [DISCONTINUED] verapamil (VERELAN) 360 MG 24 hr capsule TAKE ONE CAPSULE BY MOUTH AT BEDTIME.   No facility-administered medications prior to visit.    Review of Systems  Cardiovascular:  Negative for chest pain and palpitations.    Last metabolic panel Lab Results  Component Value Date   GLUCOSE 90 06/20/2023   NA 140 06/20/2023   K 3.9 06/20/2023   CL 101 06/20/2023   CO2 25 06/20/2023    BUN 16 06/20/2023   CREATININE 1.04 06/20/2023   EGFR 82 06/20/2023   CALCIUM 9.3 06/20/2023   PROT 7.3 06/20/2023   ALBUMIN 4.7 06/20/2023   LABGLOB 2.6 06/20/2023   AGRATIO 2.0 05/14/2022   BILITOT 0.7 06/20/2023   ALKPHOS 91 06/20/2023   AST 30 06/20/2023   ALT 29 06/20/2023   ANIONGAP 11 11/24/2018   Last lipids Lab Results  Component Value Date   CHOL 153 06/20/2023   HDL 46 06/20/2023   LDLCALC 85 06/20/2023   TRIG 122 06/20/2023   CHOLHDL 3.3 06/20/2023   Last hemoglobin A1c Lab Results  Component Value Date   HGBA1C 5.6 06/20/2023        Objective    BP 134/85   Pulse 73   Ht 5\' 7"  (1.702 m)   Wt 169 lb (76.7 kg)   BMI 26.47 kg/m  BP Readings from Last 3 Encounters:  08/01/23 134/85  06/20/23 (!) 147/94  06/17/23 (!) 145/80   Wt Readings from Last 3 Encounters:  08/01/23 169 lb (76.7 kg)  06/20/23 177 lb 8 oz (80.5 kg)  06/17/23 175 lb 12.8 oz (79.7 kg)      Physical Exam Vitals reviewed.  Constitutional:      General: He is not in acute distress.    Appearance: Normal appearance. He is not ill-appearing, toxic-appearing or diaphoretic.  Eyes:     Conjunctiva/sclera: Conjunctivae normal.  Cardiovascular:     Rate and Rhythm: Normal rate and regular rhythm.     Pulses: Normal pulses.     Heart sounds: Normal heart sounds. No murmur heard.    No friction rub. No gallop.     Comments: Premature beats Pulmonary:     Effort: Pulmonary effort is normal. No respiratory distress.     Breath sounds: Normal breath sounds. No stridor. No wheezing, rhonchi or rales.  Abdominal:     General: Bowel sounds are normal. There is no distension.     Palpations: Abdomen is soft.     Tenderness: There is no abdominal tenderness.  Musculoskeletal:     Right lower leg: No edema.     Left lower leg: No edema.  Skin:    Findings: No erythema or rash.  Neurological:     Mental Status: He is alert and oriented to person, place, and time.  Psychiatric:         Mood and Affect: Mood and affect normal.        Speech: Speech normal.        Behavior: Behavior normal. Behavior is cooperative.       No results found for any visits on 08/01/23.  Assessment & Plan     Problem List Items Addressed This Visit       Cardiovascular and Mediastinum   HTN (hypertension) - Primary   Relevant Orders   EKG  12-Lead     Respiratory   Non-seasonal allergic rhinitis     Other   PND (post-nasal drip)   HLD (hyperlipidemia)   Relevant Orders   EKG 12-Lead   Other Visit Diagnoses       Premature atrial beats       Relevant Orders   EKG 12-Lead     Abnormal EKG         PVC (premature ventricular contraction)              Hypertension Hypertension is well-controlled with diltiazem 360 mg daily. Blood pressure today is 134/85, improved from 147/94 at the previous visit. No reported side effects. Discussed switching from verapamil to diltiazem due to insurance coverage. - Continue diltiazem 360 mg daily - Follow-up in June to check blood pressure  Arrhythmia Slight arrhythmia noted years ago. No current symptoms such as dyspnea, chest pain, or palpitations. Family history of AFib and congestive heart failure. Previous EKG from January 01, 2017, did not show AFib. Plan to update baseline EKG. Discussed potential for early heartbeats and importance of updated baseline EKG. - Order EKG today  Gastroesophageal Reflux Disease (GERD) Currently taking omeprazole 20 mg twice daily for GERD. No reported issues with current dosage. Significant reflux symptoms. Discussed necessity of current dosage and potential for higher doses in severe cases. - Continue omeprazole 20 mg twice daily  Allergic Rhinitis Currently taking Xyzal 5 mg daily. Discussed possibility of increasing dose to 10 mg daily for severe symptoms. No history of allergic reactions to Xyzal. Discussed that 10 mg is not typically recommended but may be tried to see if it improves symptoms. Patient  prefers to try 10 mg daily and revert to 5 mg if not tolerated. - Prescribe Xyzal 10 mg daily - Monitor for symptom improvement and side effects - Revert to 5 mg daily if 10 mg is not tolerated  General Health Maintenance Discussed importance of vaccinations and general health maintenance. Has not received shingles vaccine and is considering it. Discussed potential benefits and risks of shingles vaccine. - Recommend shingles vaccine  Follow-up - Follow-up appointment in June.      Return in about 4 months (around 11/29/2023) for CHRONIC F/U.      Ronnald Ramp, MD  [facility] [phone] (phone) [phone] (fax)  [facility]

## 2023-08-04 ENCOUNTER — Other Ambulatory Visit: Payer: Self-pay | Admitting: Family Medicine

## 2023-08-04 NOTE — Telephone Encounter (Unsigned)
 Copied from CRM [id number]. Topic: Clinical - Medication Refill >> Aug 04, 2023  9:38 AM Higinio Roger wrote: Most Recent Primary Care Visit:  Provider: Ronnald Ramp  Department: [id number]  Visit Type: PHYSICAL  Date: 06/20/2023  Medication: levocetirizine (XYZAL) 5 MG tablet   Has the patient contacted their pharmacy? Yes (Agent: If no, request that the patient contact the pharmacy for the refill. If patient does not wish to contact the pharmacy document the reason why and proceed with request.) (Agent: If yes, when and what did the pharmacy advise?) On 08/04/23, Pharmacy stated they have not received the medication.  Is this the correct pharmacy for this prescription? Yes If no, delete pharmacy and type the correct one.  This is the patient's preferred pharmacy:   [facility] - [place], [place] - [address] [place] [address] Phone: [phone] Fax: [phone]   Has the prescription been filled recently? No  Is the patient out of the medication? Yes  Has the patient been seen for an appointment in the last year OR does the patient have an upcoming appointment? Yes  Can we respond through MyChart? Yes  Agent: Please be advised that Rx refills may take up to 3 business days. We ask that you follow-up with your pharmacy.

## 2023-08-05 ENCOUNTER — Telehealth: Payer: Self-pay

## 2023-08-05 DIAGNOSIS — H40003 Preglaucoma, unspecified, bilateral: Secondary | ICD-10-CM | POA: Diagnosis not present

## 2023-08-05 DIAGNOSIS — H2513 Age-related nuclear cataract, bilateral: Secondary | ICD-10-CM | POA: Diagnosis not present

## 2023-08-05 DIAGNOSIS — H5203 Hypermetropia, bilateral: Secondary | ICD-10-CM | POA: Diagnosis not present

## 2023-08-05 MED ORDER — LEVOCETIRIZINE DIHYDROCHLORIDE 5 MG PO TABS: 5.0000 mg | ORAL_TABLET | Freq: Two times a day (BID) | ORAL | 3 refills | Status: DC

## 2023-08-05 MED ORDER — LEVOCETIRIZINE DIHYDROCHLORIDE 5 MG PO TABS: 5.0000 mg | ORAL_TABLET | Freq: Two times a day (BID) | ORAL | Status: DC

## 2023-08-05 NOTE — Telephone Encounter (Signed)
 Prescription was sent as "Print" so pharmacy never got the order. Have re-sent prescription.

## 2023-08-05 NOTE — Telephone Encounter (Signed)
 Copied from CRM [id number]. Topic: Clinical - Medication Refill >> Aug 04, 2023  9:38 AM Higinio Roger wrote: Most Recent Primary Care Visit:  Provider: Ronnald Ramp  Department: [id number]  Visit Type: PHYSICAL  Date: 06/20/2023  Medication: levocetirizine (XYZAL) 5 MG tablet   Has the patient contacted their pharmacy? Yes (Agent: If no, request that the patient contact the pharmacy for the refill. If patient does not wish to contact the pharmacy document the reason why and proceed with request.) (Agent: If yes, when and what did the pharmacy advise?) On 08/04/23, Pharmacy stated they have not received the medication.  Is this the correct pharmacy for this prescription? Yes If no, delete pharmacy and type the correct one.  This is the patient's preferred pharmacy:   [facility] - [place], [place] - [address] [place] [address] Phone: [phone] Fax: [phone]   Has the prescription been filled recently? No  Is the patient out of the medication? Yes  Has the patient been seen for an appointment in the last year OR does the patient have an upcoming appointment? Yes  Can we respond through MyChart? Yes  Agent: Please be advised that Rx refills may take up to 3 business days. We ask that you follow-up with your pharmacy.

## 2023-08-05 NOTE — Telephone Encounter (Signed)
 Requested medications are due for refill today.  no  Requested medications are on the active medications list.  yes  Last refill. 08/01/2023 #60 3 rf - Rx went as "print"   Future visit scheduled.   yes  Notes to clinic.  Rx has pt taking this medication 2 times daily. This is from the note of recent OV. Chronic Sinusitis Chronic sinusitis managed with Xyzal 5 mg daily. Discussed potential increase to 10 mg, but no 10 mg tablet available. Recommended to continue with 5 mg. 10 mg typically prescribed for severe allergic reactions, not chronic sinusitis. - Continue Xyzal 5 mg daily     Requested Prescriptions  Pending Prescriptions Disp Refills   levocetirizine (XYZAL) 5 MG tablet      Sig: Take 1 tablet (5 mg total) by mouth in the morning and at bedtime.     Ear, Nose, and Throat:  Antihistamines - levocetirizine dihydrochloride Passed - 08/05/2023 11:37 AM      Passed - Cr in normal range and within 360 days    Creatinine, Ser  Date Value Ref Range Status  06/20/2023 1.04 0.76 - 1.27 mg/dL Final         Passed - eGFR is 10 or above and within 360 days    GFR calc Af Amer  Date Value Ref Range Status  08/05/2019 101 >59 mL/min/1.73 Final   GFR calc non Af Amer  Date Value Ref Range Status  08/05/2019 87 >59 mL/min/1.73 Final   GFR  Date Value Ref Range Status  06/24/2016 79.85 >60.00 mL/min Final   eGFR  Date Value Ref Range Status  06/20/2023 82 >59 mL/min/1.73 Final         Passed - Valid encounter within last 12 months    Recent Outpatient Visits           1 month ago Annual physical exam   [place] [facility] Simmons-Robinson, Tawanna Cooler, MD   7 months ago Subacute pansinusitis   [place] [facility] Simmons-Robinson, Martell, MD   11 months ago Primary hypertension   [place] [facility] Simmons-Robinson, [place], MD   1 year ago Primary hypertension   [place] [facility]  Simmons-Robinson, [place], MD   1 year ago Primary hypertension   [place] [facility] Simmons-Robinson, Tawanna Cooler, MD       Future Appointments             In 3 months Simmons-Robinson, Tawanna Cooler, MD [facility]

## 2023-08-05 NOTE — Telephone Encounter (Signed)
 Rx has been sent

## 2023-08-18 ENCOUNTER — Encounter: Payer: Self-pay | Admitting: Family Medicine

## 2023-08-22 DIAGNOSIS — H40003 Preglaucoma, unspecified, bilateral: Secondary | ICD-10-CM | POA: Diagnosis not present

## 2023-08-25 NOTE — Telephone Encounter (Signed)
Please see the message below and advise.

## 2023-09-02 DIAGNOSIS — H40003 Preglaucoma, unspecified, bilateral: Secondary | ICD-10-CM | POA: Diagnosis not present

## 2023-09-04 DIAGNOSIS — D225 Melanocytic nevi of trunk: Secondary | ICD-10-CM | POA: Diagnosis not present

## 2023-09-04 DIAGNOSIS — D2261 Melanocytic nevi of right upper limb, including shoulder: Secondary | ICD-10-CM | POA: Diagnosis not present

## 2023-09-04 DIAGNOSIS — D2262 Melanocytic nevi of left upper limb, including shoulder: Secondary | ICD-10-CM | POA: Diagnosis not present

## 2023-09-04 DIAGNOSIS — D2272 Melanocytic nevi of left lower limb, including hip: Secondary | ICD-10-CM | POA: Diagnosis not present

## 2023-09-19 ENCOUNTER — Ambulatory Visit (INDEPENDENT_AMBULATORY_CARE_PROVIDER_SITE_OTHER): Payer: Medicare Other | Admitting: Nurse Practitioner

## 2023-09-23 ENCOUNTER — Ambulatory Visit (INDEPENDENT_AMBULATORY_CARE_PROVIDER_SITE_OTHER): Payer: Medicare Other | Admitting: Nurse Practitioner

## 2023-09-23 ENCOUNTER — Encounter (INDEPENDENT_AMBULATORY_CARE_PROVIDER_SITE_OTHER): Payer: Self-pay | Admitting: Nurse Practitioner

## 2023-09-23 VITALS — BP 144/90 | HR 68 | Resp 18 | Ht 67.0 in | Wt 165.4 lb

## 2023-09-23 DIAGNOSIS — L03116 Cellulitis of left lower limb: Secondary | ICD-10-CM | POA: Diagnosis not present

## 2023-09-23 DIAGNOSIS — I890 Lymphedema, not elsewhere classified: Secondary | ICD-10-CM

## 2023-09-23 DIAGNOSIS — I10 Essential (primary) hypertension: Secondary | ICD-10-CM

## 2023-09-24 NOTE — Progress Notes (Signed)
 Subjective:    Patient ID: William Coleman., male    DOB: 1961/02/17, 63 y.o.   MRN: [id number] Chief Complaint  Patient presents with   Follow-up    F/U 3-4 months    The patient returns to the office for followup evaluation regarding leg swelling.  The swelling has persisted but with the lymph pump is under much, much better controlled.  There have not been any interval development of a ulcerations or wounds.  The patient denies problems with the pump, noting it is working well and the leggings are in good condition.  Since the previous visit the patient has been wearing graduated compression stockings and using the lymph pump on a routine basis and  has noted significant improvement in the lymphedema.   Patient stated the lymph pump has been helpful with the treatment of the lymphedema.      Review of Systems  All other systems reviewed and are negative.      Objective:   Physical Exam Vitals reviewed.  HENT:     Head: Normocephalic.  Cardiovascular:     Rate and Rhythm: Normal rate.     Pulses: Normal pulses.  Pulmonary:     Effort: Pulmonary effort is normal.  Musculoskeletal:     Right lower leg: No edema.     Left lower leg: No edema.  Skin:    General: Skin is warm and dry.  Neurological:     Mental Status: He is alert and oriented to person, place, and time.  Psychiatric:        Mood and Affect: Mood normal.        Behavior: Behavior normal.        Thought Content: Thought content normal.        Judgment: Judgment normal.     BP (!) 144/90   Pulse 68   Resp 18   Ht 5\' 7"  (1.702 m)   Wt 165 lb 6.4 oz (75 kg)   BMI 25.91 kg/m   Past Medical History:  Diagnosis Date   Allergy    ongoing ENT outpatient treatment   Anancastic neurosis    Anginal pain (HCC)    Anxiety, generalized    Bipolar disorder (HCC)    Chronic diarrhea    Depression    GERD (gastroesophageal reflux disease)    currently taking Prilosec OTC 20 mg   Hypertension     Hypothyroidism    Laryngopharyngeal reflux (LPR) 01/01/2018   Obsessive compulsive disorder    OCD (obsessive compulsive disorder)    Sleep apnea    patient states it was "marginal", no CPAP    Social History   Socioeconomic History   Marital status: Single    Spouse name: Not on file   Number of children: 0   Years of education: Not on file   Highest education level: Some college, no degree  Occupational History   Not on file  Tobacco Use   Smoking status: Never   Smokeless tobacco: Never   Tobacco comments:    N/A  Vaping Use   Vaping status: Never Used  Substance and Sexual Activity   Alcohol use: Not Currently    Comment: Rarely   Drug use: Never   Sexual activity: Not Currently    Birth control/protection: Condom  Other Topics Concern   Not on file  Social History Narrative   Not on file   Social Drivers of Health   Financial Resource Strain: Low Risk  (06/13/2023)  Overall Financial Resource Strain (CARDIA)    Difficulty of Paying Living Expenses: Not very hard  Food Insecurity: No Food Insecurity (06/13/2023)   Hunger Vital Sign    Worried About Running Out of Food in the Last Year: Never true    Ran Out of Food in the Last Year: Never true  Transportation Needs: No Transportation Needs (06/13/2023)   PRAPARE - Administrator, Civil Service (Medical): No    Lack of Transportation (Non-Medical): No  Physical Activity: Insufficiently Active (06/13/2023)   Exercise Vital Sign    Days of Exercise per Week: 1 day    Minutes of Exercise per Session: 10 min  Stress: No Stress Concern Present (06/13/2023)   [employer] of Occupational Health - Occupational Stress Questionnaire    Feeling of Stress : Not at all  Social Connections: Moderately Integrated (06/13/2023)   Social Connection and Isolation Panel [NHANES]    Frequency of Communication with Friends and Family: More than three times a week    Frequency of Social Gatherings with Friends and  Family: Three times a week    Attends Religious Services: More than 4 times per year    Active Member of Clubs or Organizations: Yes    Attends Banker Meetings: More than 4 times per year    Marital Status: Never married  Intimate Partner Violence: Not At Risk (12/23/2022)   Humiliation, Afraid, Rape, and Kick questionnaire    Fear of Current or Ex-Partner: No    Emotionally Abused: No    Physically Abused: No    Sexually Abused: No    Past Surgical History:  Procedure Laterality Date   COLONOSCOPY WITH PROPOFOL N/A 02/05/2016   Procedure: COLONOSCOPY WITH PROPOFOL;  Surgeon: Midge Minium, MD;  Location[facility];  Service: Endoscopy;  Laterality: N/A;   MOUTH SURGERY     orthodontic surgery for underbite   MYRINGOTOMY WITH TUBE PLACEMENT     NASAL SEPTUM SURGERY     NASAL SINUS SURGERY Bilateral 10/09/2020   POLYPECTOMY N/A 02/05/2016   Procedure: POLYPECTOMY;  Surgeon: Midge Minium, MD;  Location: [facility];  Service: Endoscopy;  Laterality: N/A;    Family History  Problem Relation Age of Onset   Depression Mother    Anxiety disorder Mother    Congestive Heart Failure Father    Prostate cancer Father    Hypertension Father    Hypertension Sister     Allergies  Allergen Reactions   Penicillins Hives and Rash   Sulfa Antibiotics Hives   Amoxicillin Hives       Latest Ref Rng & Units 06/20/2023    2:48 PM 04/25/2021    2:42 PM 08/05/2019    4:32 PM  CBC  WBC 3.4 - 10.8 x10E3/uL 5.9  6.5  7.7   Hemoglobin 13.0 - 17.7 g/dL 69.6  29.5  28.4   Hematocrit 37.5 - 51.0 % 46.7  46.2  43.1   Platelets 150 - 450 x10E3/uL 268  250  300       CMP     Component Value Date/Time   NA 140 06/20/2023 1448   K 3.9 06/20/2023 1448   CL 101 06/20/2023 1448   CO2 25 06/20/2023 1448   GLUCOSE 90 06/20/2023 1448   GLUCOSE 92 11/24/2018 0633   BUN 16 06/20/2023 1448   CREATININE 1.04 06/20/2023 1448   CALCIUM 9.3 06/20/2023 1448   PROT 7.3  06/20/2023 1448   ALBUMIN 4.7 06/20/2023 1448  AST 30 06/20/2023 1448   ALT 29 06/20/2023 1448   ALKPHOS 91 06/20/2023 1448   BILITOT 0.7 06/20/2023 1448   GFR 79.85 06/24/2016 1218   EGFR 82 06/20/2023 1448   GFRNONAA 87 08/05/2019 1632     No results found.     Assessment & Plan:   1. Lymphedema (Primary) Recommend:   I have reviewed my discussion with the patient regarding lymphedema and why it  causes symptoms.  Patient will continue wearing graduated compression on a daily basis. The patient should put the compression on first thing in the morning and removing them in the evening. The patient should not sleep in the compression.   In addition, behavioral modification throughout the day will be continued.  This will include frequent elevation (such as in a recliner), use of over the counter pain medications as needed and exercise such as walking.  The systemic causes for chronic edema such as liver, kidney and cardiac etiologies does not appear to have significant changed over the past year.    The patient will continue aggressive use of the  lymph pump.  This will continue to improve the edema control and prevent sequela such as ulcers and infections.   The patient will follow-up with me on an annual basis.   2. Cellulitis of left lower extremity No cellulitis present today  3. Primary hypertension Continue antihypertensive medications as already ordered, these medications have been reviewed and there are no changes at this time.   Current Outpatient Medications on File Prior to Visit  Medication Sig Dispense Refill   Ascorbic Acid (VITAMIN C) 100 MG tablet Take 100 mg by mouth daily.     azelastine (ASTELIN) 0.1 % nasal spray Place 2 sprays into both nostrils 2 (two) times daily. Use in each nostril as directed 30 mL 6   B Complex Vitamins (VITAMIN B COMPLEX) TABS      Cholecalciferol (VITAMIN D3) 10 MCG (400 UNIT) CAPS      diltiazem (CARDIZEM CD) 360 MG 24 hr  capsule Take 1 capsule (360 mg total) by mouth daily. 30 capsule 3   diphenoxylate-atropine (LOMOTIL) 2.5-0.025 MG tablet Take 2 tablets by mouth 4 (four) times daily as needed for diarrhea or loose stools. 240 tablet 5   hyoscyamine (ANASPAZ) 0.125 MG TBDP disintergrating tablet Place 1 tablet (0.125 mg total) under the tongue every 4 (four) hours as needed. 180 tablet 1   levocetirizine (XYZAL) 5 MG tablet Take 1 tablet (5 mg total) by mouth in the morning and at bedtime. 60 tablet 3   Multiple Vitamin (MULTIVITAMIN WITH MINERALS) TABS tablet Take 1 tablet by mouth daily.     omeprazole (PRILOSEC) 20 MG capsule Take 1 capsule (20 mg total) by mouth 2 (two) times daily before a meal.     rosuvastatin (CRESTOR) 10 MG tablet TAKE 1 TABLET BY MOUTH DAILY 90 tablet 1   sildenafil (VIAGRA) 50 MG tablet Take 1 tablet (50 mg total) by mouth as needed for erectile dysfunction. 25 tablet 4   No current facility-administered medications on file prior to visit.    There are no Patient Instructions on file for this visit. No follow-ups on file.   Aubrianna Orchard E Luby Seamans, NP

## 2023-10-22 ENCOUNTER — Other Ambulatory Visit: Payer: Self-pay | Admitting: Family Medicine

## 2023-10-28 ENCOUNTER — Encounter (INDEPENDENT_AMBULATORY_CARE_PROVIDER_SITE_OTHER): Payer: Self-pay

## 2023-12-01 DIAGNOSIS — K2270 Barrett's esophagus without dysplasia: Secondary | ICD-10-CM | POA: Diagnosis not present

## 2023-12-01 DIAGNOSIS — R197 Diarrhea, unspecified: Secondary | ICD-10-CM | POA: Diagnosis not present

## 2023-12-02 ENCOUNTER — Ambulatory Visit (INDEPENDENT_AMBULATORY_CARE_PROVIDER_SITE_OTHER): Payer: Medicare Other | Admitting: Family Medicine

## 2023-12-02 ENCOUNTER — Encounter: Payer: Self-pay | Admitting: Family Medicine

## 2023-12-02 VITALS — BP 123/82 | HR 63 | Ht 67.0 in | Wt 162.0 lb

## 2023-12-02 DIAGNOSIS — I10 Essential (primary) hypertension: Secondary | ICD-10-CM

## 2023-12-02 DIAGNOSIS — N5203 Combined arterial insufficiency and corporo-venous occlusive erectile dysfunction: Secondary | ICD-10-CM | POA: Diagnosis not present

## 2023-12-02 DIAGNOSIS — E782 Mixed hyperlipidemia: Secondary | ICD-10-CM

## 2023-12-02 MED ORDER — SILDENAFIL CITRATE 50 MG PO TABS
50.0000 mg | ORAL_TABLET | ORAL | 4 refills | Status: DC | PRN
Start: 2023-12-02 — End: 2023-12-02

## 2023-12-02 MED ORDER — SILDENAFIL CITRATE 50 MG PO TABS
100.0000 mg | ORAL_TABLET | ORAL | 5 refills | Status: AC | PRN
Start: 2023-12-02 — End: ?

## 2023-12-02 NOTE — Progress Notes (Signed)
 Established patient visit   Patient: William Coleman.   DOB: 07-Sep-1960   63 y.o. Male  MRN: [id number] Visit Date: 12/02/2023  Today's healthcare provider: Rockie Agent, MD   Chief Complaint  Patient presents with  . Hypertension   Subjective       Discussed the use of AI scribe software for clinical note transcription with the patient, who gave verbal consent to proceed.  History of Present Illness William Coleman. is a 63 year old male with hypertension who presents for a follow-up visit.  His blood pressure has been chronically elevated, with recent readings of 134/95 mmHg. He is currently on diltiazem  360 mg daily, which has improved his blood pressure to 132/82 mmHg. He previously used hydrochlorothiazide , which was discontinued due to low potassium levels. He has also tried losartan , lisinopril, and hydralazine  in the past. He has not been checking his blood pressure at home recently. He feels well on his current medication regimen and has not experienced any adverse effects from diltiazem .  He is also taking Crestor  10 mg for cholesterol management and Xyzal  5 mg at night for sinus problems, which are worse in the winter. He has not made any recent changes to his medications.  He mentions a sunburn on the back of his neck from painting outside without sunscreen, which is slightly red but not peeling.  He discusses his past experience with Prozac  in the early 1990s, which caused bowel issues that were not initially recognized as a side effect. He notes that this information became more widely known by 2015.  He inquires about a medication called RO, which he heard about as an alternative to Viagra  or Cialis . He is currently using Viagra , which has expired, and requests a refill of 100 mg tablets.     Past Medical History:  Diagnosis Date  . Allergy     ongoing ENT outpatient treatment  . Anancastic neurosis   . Anginal pain (HCC)   .  Anxiety, generalized   . Bipolar disorder (HCC)   . Chronic diarrhea   . Depression   . GERD (gastroesophageal reflux disease)    currently taking Prilosec OTC 20 mg  . Hypertension   . Hypothyroidism   . Laryngopharyngeal reflux (LPR) 01/01/2018  . Obsessive compulsive disorder   . OCD (obsessive compulsive disorder)   . Sleep apnea    patient states it was marginal, no CPAP    Medications: Outpatient Medications Prior to Visit  Medication Sig  . Ascorbic Acid  (VITAMIN C ) 100 MG tablet Take 100 mg by mouth daily.  . azelastine  (ASTELIN ) 0.1 % nasal spray Place 2 sprays into both nostrils 2 (two) times daily. Use in each nostril as directed  . B Complex Vitamins (VITAMIN B COMPLEX) TABS   . Cholecalciferol (VITAMIN D3) 10 MCG (400 UNIT) CAPS   . diltiazem  (CARDIZEM  CD) 360 MG 24 hr capsule TAKE 1 CAPSULE BY MOUTH DAILY  . diphenoxylate -atropine  (LOMOTIL ) 2.5-0.025 MG tablet Take 2 tablets by mouth 4 (four) times daily as needed for diarrhea or loose stools.  . hyoscyamine  (ANASPAZ ) 0.125 MG TBDP disintergrating tablet Place 1 tablet (0.125 mg total) under the tongue every 4 (four) hours as needed.  SABRA levocetirizine (XYZAL ) 5 MG tablet Take 1 tablet (5 mg total) by mouth in the morning and at bedtime.  . Multiple Vitamin (MULTIVITAMIN WITH MINERALS) TABS tablet Take 1 tablet by mouth daily.  . omeprazole  (PRILOSEC) 20 MG capsule Take 1 capsule (  20 mg total) by mouth 2 (two) times daily before a meal.  . rosuvastatin  (CRESTOR ) 10 MG tablet TAKE 1 TABLET BY MOUTH DAILY  . [DISCONTINUED] sildenafil  (VIAGRA ) 50 MG tablet Take 1 tablet (50 mg total) by mouth as needed for erectile dysfunction.   No facility-administered medications prior to visit.    Review of Systems  Last CBC Lab Results  Component Value Date   WBC 5.9 06/20/2023   HGB 16.2 06/20/2023   HCT 46.7 06/20/2023   MCV 91 06/20/2023   MCH 31.7 06/20/2023   RDW 12.2 06/20/2023   PLT 268 06/20/2023   Last  metabolic panel Lab Results  Component Value Date   GLUCOSE 90 06/20/2023   NA 140 06/20/2023   K 3.9 06/20/2023   CL 101 06/20/2023   CO2 25 06/20/2023   BUN 16 06/20/2023   CREATININE 1.04 06/20/2023   EGFR 82 06/20/2023   CALCIUM  9.3 06/20/2023   PROT 7.3 06/20/2023   ALBUMIN 4.7 06/20/2023   LABGLOB 2.6 06/20/2023   AGRATIO 2.0 05/14/2022   BILITOT 0.7 06/20/2023   ALKPHOS 91 06/20/2023   AST 30 06/20/2023   ALT 29 06/20/2023   ANIONGAP 11 11/24/2018   Last lipids Lab Results  Component Value Date   CHOL 153 06/20/2023   HDL 46 06/20/2023   LDLCALC 85 06/20/2023   TRIG 122 06/20/2023   CHOLHDL 3.3 06/20/2023   Last hemoglobin A1c Lab Results  Component Value Date   HGBA1C 5.6 06/20/2023   Last thyroid  functions Lab Results  Component Value Date   TSH 0.595 06/20/2023        Objective    BP 123/82   Pulse 63   Ht 5' 7 (1.702 m)   Wt 162 lb (73.5 kg)   SpO2 95%   BMI 25.37 kg/m  BP Readings from Last 3 Encounters:  12/02/23 123/82  09/23/23 (!) 144/90  08/01/23 134/85   Wt Readings from Last 3 Encounters:  12/02/23 162 lb (73.5 kg)  09/23/23 165 lb 6.4 oz (75 kg)  08/01/23 169 lb (76.7 kg)        Physical Exam Vitals reviewed.  Constitutional:      General: He is not in acute distress.    Appearance: Normal appearance. He is not ill-appearing.   Cardiovascular:     Rate and Rhythm: Normal rate and regular rhythm.     Comments: PACs Pulmonary:     Effort: Pulmonary effort is normal. No respiratory distress.     Breath sounds: No wheezing, rhonchi or rales.   Neurological:     Mental Status: He is alert and oriented to person, place, and time.   Psychiatric:        Mood and Affect: Mood normal.        Behavior: Behavior normal.      No results found for any visits on 12/02/23.  Assessment & Plan     Problem List Items Addressed This Visit       Cardiovascular and Mediastinum   HTN (hypertension) - Primary   Relevant  Medications   sildenafil  (VIAGRA ) 50 MG tablet   Combined arterial insufficiency and corporo-venous occlusive erectile dysfunction   Relevant Medications   sildenafil  (VIAGRA ) 50 MG tablet     Other   HLD (hyperlipidemia)   Relevant Medications   sildenafil  (VIAGRA ) 50 MG tablet   Assessment & Plan Hypertension Chronic hypertension with current blood pressure readings of 134/95 mmHg and 132/82 mmHg. Previously on hydrochlorothiazide , which  was discontinued due to hypokalemia. Current regimen includes diltiazem  360 mg daily, which is well-tolerated and effective in managing blood pressure. Discussed the goal of achieving a blood pressure closer to 120/80 mmHg, but current levels are acceptable. He has not been monitoring blood pressure at home recently. - Continue diltiazem  360 mg daily - Encourage regular home blood pressure monitoring - Follow up in 6 months for blood pressure management  Hyperlipidemia Managed with Crestor  10 mg daily. Refill status is up to date until August. - Continue Crestor  10 mg daily - Ensure medication refills are up to date  Erectile Dysfunction Currently using Viagra , but prescription has expired. Discussed alternative options such as Cialis  and a product called RO, which contains the same active ingredients as Viagra  and Cialis . He prefers to continue with Viagra . Prescription updated to 100 mg tablets. - Prescribe Viagra  100 mg as needed with refills  General Health Maintenance Discussed the importance of using sunscreen to prevent sunburn, especially when outdoors for extended periods. He experienced a mild sunburn recently but is managing it with aloe. - Advise use of sunscreen during outdoor activities - Use aloe for sunburn relief as needed     Return in about 6 months (around 05/31/2024) for CHRONIC F/U.         Rockie Agent, MD  [facility] [phone] (phone) [phone] (fax)  [facility]

## 2023-12-03 ENCOUNTER — Encounter: Payer: Self-pay | Admitting: Family Medicine

## 2023-12-04 ENCOUNTER — Other Ambulatory Visit: Payer: Self-pay

## 2023-12-04 MED ORDER — OMEPRAZOLE 20 MG PO CPDR: 20.0000 mg | DELAYED_RELEASE_CAPSULE | Freq: Two times a day (BID) | ORAL | 3 refills | Status: AC

## 2023-12-24 ENCOUNTER — Ambulatory Visit: Payer: Medicare Other

## 2023-12-24 DIAGNOSIS — Z0000 Encounter for general adult medical examination without abnormal findings: Secondary | ICD-10-CM

## 2023-12-24 NOTE — Patient Instructions (Signed)
 William Coleman , Thank you for taking time out of your busy schedule to complete your Annual Wellness Visit with me. I enjoyed our conversation and look forward to speaking with you again next year. I, as well as your care team,  appreciate your ongoing commitment to your health goals. Please review the following plan we discussed and let me know if I can assist you in the future.   Follow up Visits: Next Medicare AWV with our clinical staff:   12/29/24 @ 11:30 AM BY PHONE Have you seen your provider in the last 6 months (3 months if uncontrolled diabetes)? Yes  Clinician Recommendations:  Aim for 30 minutes of exercise or brisk walking, 6-8 glasses of water , and 5 servings of fruits and vegetables each day. TAKE CARE!      This is a list of the screening recommended for you and due dates:  Health Maintenance  Topic Date Due   Zoster (Shingles) Vaccine (1 of 2) Never done   COVID-19 Vaccine (7 - 2024-25 season) 02/09/2023   Flu Shot  01/09/2024   Medicare Annual Wellness Visit  12/23/2024   DTaP/Tdap/Td vaccine (4 - Td or Tdap) 07/03/2027   Colon Cancer Screening  12/04/2031   Hepatitis B Vaccine  Completed   Hepatitis C Screening  Completed   HIV Screening  Completed   HPV Vaccine  Aged Out   Meningitis B Vaccine  Aged Out    Advanced directives: (ACP Link)Information on Advanced Care Planning can be found at [place]  Secretary of [facility] Advance Health Care Directives Advance Health Care Directives. [email or web address]  Advance Care Planning is important because it:  [x]  Makes sure you receive the medical care that is consistent with your values, goals, and preferences  [x]  It provides guidance to your family and loved ones and reduces their decisional burden about whether or not they are making the right decisions based on your wishes.  Follow the link provided in your after visit summary or read over the paperwork we have mailed to you to help you started getting your Advance Directives in  place. If you need assistance in completing these, please reach out to us  so that we can help you!

## 2023-12-24 NOTE — Progress Notes (Signed)
 Subjective:   William Coleman. is a 63 y.o. who presents for a Medicare Wellness preventive visit.  As a reminder, Annual Wellness Visits don't include a physical exam, and some assessments may be limited, especially if this visit is performed virtually. We may recommend an in-person follow-up visit with your provider if needed.  Visit Complete: Virtual I connected with  William Aycock Bunton Jr. on 12/24/23 by a audio enabled telemedicine application and verified that I am speaking with the correct person using two identifiers.  Patient Location: Home  Provider Location: Home Office  I discussed the limitations of evaluation and management by telemedicine. The patient expressed understanding and agreed to proceed.  Vital Signs: Because this visit was a virtual/telehealth visit, some criteria may be missing or patient reported. Any vitals not documented were not able to be obtained and vitals that have been documented are patient reported.  VideoDeclined- This patient declined Librarian, academic. Therefore the visit was completed with audio only.  Persons Participating in Visit: Patient.  AWV Questionnaire: No: Patient Medicare AWV questionnaire was not completed prior to this visit.  Cardiac Risk Factors include: advanced age (>41men, >73 women);dyslipidemia;male gender;hypertension;sedentary lifestyle     Objective:    Today's Vitals   12/24/23 1429  PainSc: 0-No pain   There is no height or weight on file to calculate BMI.     12/24/2023    2:35 PM 12/23/2022    3:06 PM 12/13/2021    2:53 PM 11/18/2018    6:54 PM 11/17/2018    9:34 PM 05/26/2017    3:09 PM 12/31/2016    4:41 PM  Advanced Directives  Does Patient Have a Medical Advance Directive? No No No  No No    Would patient like information on creating a medical advance directive? No - Patient declined  No - Patient declined  No - Patient declined        Information is  confidential and restricted. Go to Review Flowsheets to unlock data.   Data saved with a previous flowsheet row definition    Current Medications (verified) Outpatient Encounter Medications as of 12/24/2023  Medication Sig   Ascorbic Acid  (VITAMIN C ) 100 MG tablet Take 100 mg by mouth daily.   azelastine  (ASTELIN ) 0.1 % nasal spray Place 2 sprays into both nostrils 2 (two) times daily. Use in each nostril as directed   B Complex Vitamins (VITAMIN B COMPLEX) TABS    Cholecalciferol (VITAMIN D3) 10 MCG (400 UNIT) CAPS    diltiazem  (CARDIZEM  CD) 360 MG 24 hr capsule TAKE 1 CAPSULE BY MOUTH DAILY   diphenoxylate -atropine  (LOMOTIL ) 2.5-0.025 MG tablet Take 2 tablets by mouth 4 (four) times daily as needed for diarrhea or loose stools.   hyoscyamine  (ANASPAZ ) 0.125 MG TBDP disintergrating tablet Place 1 tablet (0.125 mg total) under the tongue every 4 (four) hours as needed.   levocetirizine (XYZAL ) 5 MG tablet Take 1 tablet (5 mg total) by mouth in the morning and at bedtime.   Multiple Vitamin (MULTIVITAMIN WITH MINERALS) TABS tablet Take 1 tablet by mouth daily.   omeprazole  (PRILOSEC) 20 MG capsule Take 1 capsule (20 mg total) by mouth 2 (two) times daily before a meal.   rosuvastatin  (CRESTOR ) 10 MG tablet TAKE 1 TABLET BY MOUTH DAILY   sildenafil  (VIAGRA ) 50 MG tablet Take 2 tablets (100 mg total) by mouth as needed for erectile dysfunction.   No facility-administered encounter medications on file as of 12/24/2023.  Allergies (verified) Penicillins, Sulfa antibiotics, and Amoxicillin   History: Past Medical History:  Diagnosis Date   Allergy     ongoing ENT outpatient treatment   Anancastic neurosis    Anginal pain (HCC)    Anxiety, generalized    Bipolar disorder (HCC)    Chronic diarrhea    Depression    GERD (gastroesophageal reflux disease)    currently taking Prilosec OTC 20 mg   Hypertension    Hypothyroidism    Laryngopharyngeal reflux (LPR) 01/01/2018   Obsessive  compulsive disorder    OCD (obsessive compulsive disorder)    Sleep apnea    patient states it was marginal, no CPAP   Past Surgical History:  Procedure Laterality Date   COLONOSCOPY WITH PROPOFOL  N/A 02/05/2016   Procedure: COLONOSCOPY WITH PROPOFOL ;  Surgeon: Rogelia Copping, MD;  Location: [facility];  Service: Endoscopy;  Laterality: N/A;   MOUTH SURGERY     orthodontic surgery for underbite   MYRINGOTOMY WITH TUBE PLACEMENT     NASAL SEPTUM SURGERY     NASAL SINUS SURGERY Bilateral 10/09/2020   POLYPECTOMY N/A 02/05/2016   Procedure: POLYPECTOMY;  Surgeon: Rogelia Copping, MD;  Location: [facility];  Service: Endoscopy;  Laterality: N/A;   Family History  Problem Relation Age of Onset   Depression Mother    Anxiety disorder Mother    Congestive Heart Failure Father    Prostate cancer Father    Hypertension Father    Hypertension Sister    Social History   Socioeconomic History   Marital status: Single    Spouse name: Not on file   Number of children: 0   Years of education: Not on file   Highest education level: Some college, no degree  Occupational History   Not on file  Tobacco Use   Smoking status: Never   Smokeless tobacco: Never   Tobacco comments:    N/A  Vaping Use   Vaping status: Never Used  Substance and Sexual Activity   Alcohol use: Not Currently    Comment: Rarely   Drug use: Never   Sexual activity: Not Currently    Birth control/protection: Condom  Other Topics Concern   Not on file  Social History Narrative   Not on file   Social Drivers of Health   Financial Resource Strain: Low Risk  (12/24/2023)   Overall Financial Resource Strain (CARDIA)    Difficulty of Paying Living Expenses: Not hard at all  Food Insecurity: No Food Insecurity (12/24/2023)   Hunger Vital Sign    Worried About Running Out of Food in the Last Year: Never true    Ran Out of Food in the Last Year: Never true  Transportation Needs: No Transportation  Needs (12/24/2023)   PRAPARE - Administrator, Civil Service (Medical): No    Lack of Transportation (Non-Medical): No  Physical Activity: Insufficiently Active (12/24/2023)   Exercise Vital Sign    Days of Exercise per Week: 2 days    Minutes of Exercise per Session: 30 min  Stress: No Stress Concern Present (12/24/2023)   [employer] of Occupational Health - Occupational Stress Questionnaire    Feeling of Stress: Not at all  Social Connections: Moderately Integrated (12/24/2023)   Social Connection and Isolation Panel    Frequency of Communication with Friends and Family: More than three times a week    Frequency of Social Gatherings with Friends and Family: Three times a week    Attends Religious Services: More  than 4 times per year    Active Member of Clubs or Organizations: Yes    Attends Engineer, structural: More than 4 times per year    Marital Status: Never married    Tobacco Counseling Counseling given: Not Answered Tobacco comments: N/A    Clinical Intake:  Pre-visit preparation completed: Yes  Pain : No/denies pain Pain Score: 0-No pain     BMI - recorded: 25.4 Nutritional Status: BMI 25 -29 Overweight Nutritional Risks: None Diabetes: No  Lab Results  Component Value Date   HGBA1C 5.6 06/20/2023   HGBA1C 5.3 05/14/2022   HGBA1C 5.1 11/14/2015     How often do you need to have someone help you when you read instructions, pamphlets, or other written materials from your doctor or pharmacy?: 1 - Never  Interpreter Needed?: No  Information entered by :: JHONNIE DAS, LPN   Activities of Daily Living     12/24/2023    2:39 PM 12/17/2023    9:33 AM  In your present state of health, do you have any difficulty performing the following activities:  Hearing? 1 0  Vision? 0 0  Difficulty concentrating or making decisions? 0 0  Walking or climbing stairs? 0 0  Dressing or bathing? 0 0  Doing errands, shopping? 0 0  Preparing Food  and eating ? N N  Using the Toilet? N N  In the past six months, have you accidently leaked urine? N N  Do you have problems with loss of bowel control? N N  Managing your Medications? N N  Managing your Finances? N N  Housekeeping or managing your Housekeeping? N N    Patient Care Team: Sharma Coyer, MD as PCP - General (Family Medicine) [facility], [facility] (Optometry)  I have updated your Care Teams any recent Medical Services you may have received from other providers in the past year.     Assessment:   This is a routine wellness examination for [place].  Hearing/Vision screen Hearing Screening - Comments:: NO AIDS- LEFT EAR DEAF Vision Screening - Comments:: READERS- [facility]   Goals Addressed             This Visit's Progress    DIET - INCREASE WATER  INTAKE         Depression Screen     12/24/2023    2:33 PM 12/02/2023    3:25 PM 08/01/2023    2:38 PM 12/23/2022    3:05 PM 12/16/2022    2:58 PM 08/15/2022    2:35 PM 05/14/2022    3:03 PM  PHQ 2/9 Scores  PHQ - 2 Score 0 0 0 0 0 0 0  PHQ- 9 Score 0 0 0   0 0    Fall Risk     12/24/2023    2:37 PM 12/17/2023    9:33 AM 08/01/2023    2:38 PM 12/19/2022   10:23 AM 12/13/2022    9:58 AM  Fall Risk   Falls in the past year? 0 0 0 0 0  Number falls in past yr: 0   0 0  Injury with Fall? 0   0 0  Risk for fall due to : No Fall Risks      Follow up Falls evaluation completed;Falls prevention discussed   Falls prevention discussed;Education provided     MEDICARE RISK AT HOME:  Medicare Risk at Home Any stairs in or around the home?: No If so, are there any without handrails?: No  Home free of loose throw rugs in walkways, pet beds, electrical cords, etc?: Yes Adequate lighting in your home to reduce risk of falls?: Yes Life alert?: No Use of a cane, walker or w/c?: No Grab bars in the bathroom?: No Shower chair or bench in shower?: No Elevated toilet seat or a handicapped toilet?: No  TIMED UP  AND GO:  Was the test performed?  No  Cognitive Function: 6CIT completed        12/24/2023    2:40 PM 12/23/2022    3:06 PM 12/13/2021    3:00 PM  6CIT Screen  What Year?  0 points 0 points  What month?  0 points 0 points  What time? 0 points 0 points 0 points  Count back from 20 0 points 0 points 0 points  Months in reverse 0 points 0 points 0 points  Repeat phrase 0 points 0 points 0 points  Total Score  0 points 0 points    Immunizations Immunization History  Administered Date(s) Administered   Hep A / Hep B 07/24/2007, 08/21/2007, 02/12/2008   Hepatitis A 07/24/2007, 08/21/2007, 02/12/2008   Hepatitis A, Adult 07/24/2007, 08/21/2007, 02/12/2008   Hepatitis B 07/24/2007, 07/24/2007, 08/21/2007, 08/21/2007, 02/12/2008, 02/12/2008   Influenza Split 03/23/2010, 02/26/2016   Influenza,inj,Quad PF,6+ Mos 03/07/2014, 03/10/2017, 02/22/2019, 03/13/2020, 03/15/2022   Influenza-Unspecified 05/13/1997, 05/11/2018, 02/23/2019, 03/13/2020, 03/14/2021, 03/25/2023   PFIZER Comirnaty(Gray Top)Covid-19 Tri-Sucrose Vaccine 04/11/2022   PFIZER(Purple Top)SARS-COV-2 Vaccination 11/30/2020   Pfizer Covid-19 Vaccine Bivalent Booster 65yrs & up 03/30/2021   Pfizer(Comirnaty)Fall Seasonal Vaccine 12 years and older 09/16/2019, 10/07/2019, 06/19/2020   Td 07/02/2017, 07/02/2017   Tdap 07/24/2007    Screening Tests Health Maintenance  Topic Date Due   Zoster Vaccines- Shingrix (1 of 2) Never done   COVID-19 Vaccine (7 - 2024-25 season) 02/09/2023   INFLUENZA VACCINE  01/09/2024   Medicare Annual Wellness (AWV)  12/23/2024   DTaP/Tdap/Td (4 - Td or Tdap) 07/03/2027   Colonoscopy  12/04/2031   Hepatitis B Vaccines  Completed   Hepatitis C Screening  Completed   HIV Screening  Completed   HPV VACCINES  Aged Out   Meningococcal B Vaccine  Aged Out    Health Maintenance  Health Maintenance Due  Topic Date Due   Zoster Vaccines- Shingrix (1 of 2) Never done   COVID-19 Vaccine (7 -  2024-25 season) 02/09/2023   Health Maintenance Items Addressed: NEEDS SHINGRIX, PNA-   Additional Screening:  Vision Screening: Recommended annual ophthalmology exams for early detection of glaucoma and other disorders of the eye. Would you like a referral to an eye doctor? No    Dental Screening: Recommended annual dental exams for proper oral hygiene  Community Resource Referral / Chronic Care Management: CRR required this visit?  No   CCM required this visit?  No   Plan:    I have personally reviewed and noted the following in the patient's chart:   Medical and social history Use of alcohol, tobacco or illicit drugs  Current medications and supplements including opioid prescriptions. Patient is not currently taking opioid prescriptions. Functional ability and status Nutritional status Physical activity Advanced directives List of other physicians Hospitalizations, surgeries, and ER visits in previous 12 months Vitals Screenings to include cognitive, depression, and falls Referrals and appointments  In addition, I have reviewed and discussed with patient certain preventive protocols, quality metrics, and best practice recommendations. A written personalized care plan for preventive services as well as general preventive health recommendations were provided  to patient.   Jhonnie GORMAN Das, LPN   2/83/7974   After Visit Summary: (MyChart) Due to this being a telephonic visit, the after visit summary with patients personalized plan was offered to patient via MyChart   Notes: Nothing significant to report at this time.

## 2024-01-19 ENCOUNTER — Telehealth: Payer: Self-pay | Admitting: Family Medicine

## 2024-01-19 ENCOUNTER — Other Ambulatory Visit: Payer: Self-pay

## 2024-01-19 DIAGNOSIS — E782 Mixed hyperlipidemia: Secondary | ICD-10-CM

## 2024-01-19 MED ORDER — ROSUVASTATIN CALCIUM 10 MG PO TABS: 10.0000 mg | ORAL_TABLET | Freq: Every day | ORAL | 1 refills | Status: AC

## 2024-01-19 NOTE — Telephone Encounter (Signed)
[facility] faxed refill request for the following medications:   rosuvastatin (CRESTOR) 10 MG tablet     Please advise.

## 2024-02-13 ENCOUNTER — Telehealth: Payer: Self-pay

## 2024-02-13 DIAGNOSIS — K219 Gastro-esophageal reflux disease without esophagitis: Secondary | ICD-10-CM

## 2024-02-13 DIAGNOSIS — I10 Essential (primary) hypertension: Secondary | ICD-10-CM

## 2024-02-13 DIAGNOSIS — R0981 Nasal congestion: Secondary | ICD-10-CM

## 2024-02-13 NOTE — Telephone Encounter (Signed)
 Copied from CRM [id number]. Topic: Clinical - Medication Question >> Feb 13, 2024 10:31 AM Nathanel BROCKS wrote: Reason for CRM:   Pt is wanting covid vaccine, he has diabetes. He is wanting to have this done at his pharmacy.  [id number] - [place], [place] - [address] [[id number]]  Please advise and send prescription over for him.

## 2024-02-16 ENCOUNTER — Other Ambulatory Visit: Payer: Self-pay

## 2024-02-16 ENCOUNTER — Telehealth: Payer: Self-pay | Admitting: Family Medicine

## 2024-02-16 MED ORDER — DILTIAZEM HCL ER COATED BEADS 360 MG PO CP24: 360.0000 mg | ORAL_CAPSULE | Freq: Every day | ORAL | 3 refills | Status: DC

## 2024-02-16 MED ORDER — COVID-19 MRNA VAC-TRIS(PFIZER) 30 MCG/0.3ML IM SUSY
0.3000 mL | PREFILLED_SYRINGE | Freq: Once | INTRAMUSCULAR | 0 refills | Status: AC
Start: 2024-02-16 — End: 2024-02-16

## 2024-02-16 NOTE — Telephone Encounter (Signed)
 Rx for COVID vaccine [facility] to [facility] for chronic sinusitis, HTN

## 2024-02-16 NOTE — Telephone Encounter (Signed)
 [facility] faxed refill request for the following medications:  diltiazem  (CARDIZEM  CD) 360 MG 24 hr capsule    Please advise.

## 2024-02-16 NOTE — Addendum Note (Signed)
 Addended by: SIMMONS-ROBINSON, Renald Haithcock L on: 02/16/2024 01:58 PM   Modules accepted: Orders

## 2024-02-19 DIAGNOSIS — H40003 Preglaucoma, unspecified, bilateral: Secondary | ICD-10-CM | POA: Diagnosis not present

## 2024-02-27 DIAGNOSIS — H40003 Preglaucoma, unspecified, bilateral: Secondary | ICD-10-CM | POA: Diagnosis not present

## 2024-02-27 DIAGNOSIS — H2513 Age-related nuclear cataract, bilateral: Secondary | ICD-10-CM | POA: Diagnosis not present

## 2024-03-08 ENCOUNTER — Ambulatory Visit (INDEPENDENT_AMBULATORY_CARE_PROVIDER_SITE_OTHER): Admitting: Family Medicine

## 2024-03-08 ENCOUNTER — Encounter: Payer: Self-pay | Admitting: Family Medicine

## 2024-03-08 ENCOUNTER — Ambulatory Visit: Payer: Self-pay | Admitting: Family Medicine

## 2024-03-08 VITALS — BP 135/78 | HR 66 | Resp 16 | Ht 67.0 in | Wt 162.0 lb

## 2024-03-08 DIAGNOSIS — J208 Acute bronchitis due to other specified organisms: Secondary | ICD-10-CM

## 2024-03-08 MED ORDER — PROMETHAZINE-DM 6.25-15 MG/5ML PO SYRP: 5.0000 mL | ORAL_SOLUTION | Freq: Four times a day (QID) | ORAL | 0 refills | Status: DC | PRN

## 2024-03-08 MED ORDER — PREDNISONE 20 MG PO TABS
40.0000 mg | ORAL_TABLET | Freq: Every day | ORAL | 0 refills | Status: AC
Start: 2024-03-08 — End: 2024-03-13

## 2024-03-08 NOTE — Progress Notes (Signed)
 ACUTE VISIT   Patient: William Coleman.   DOB: 06-25-1960   63 y.o. Male  MRN: [id number]   PCP: Sharma Coyer, MD  Chief Complaint  Patient presents with   Acute Visit    Cough No fever or sore throat or sinus)   Subjective    HPI HPI     Acute Visit    Additional comments: Cough No fever or sore throat or sinus)      Last edited by Marylen Odella CROME, CMA on 03/08/2024  4:02 PM.       Discussed the use of AI scribe software for clinical note transcription with the patient, who gave verbal consent to proceed.  History of Present Illness William Veltre. is a 63 year old male with chronic sinusitis who presents with an acute cough and chest congestion.  He has experienced an uncontrollable cough and chest congestion that has worsened over the past few days. No fever, sore throat, sputum production, hemoptysis, or shortness of breath outside of coughing spells. The cough is felt in his chest when he coughs hard, but there is no associated chest pain or pressure.  He has a history of chronic sinusitis, which is usually a background issue. Currently, there is no sinus pressure. His symptoms have worsened in the last few days, coinciding with the onset of the cough.  He has previously been treated with medications such as prednisone , Z-Pak (azithromycin ), and other antibiotics like amoxicillin and erythromycin for similar issues, with no significant side effects reported.  He has not performed any home COVID tests and was planning to get a COVID and flu vaccine but decided to wait until he feels better. He usually waits until October to get these vaccines.     Medications: Outpatient Medications Prior to Visit  Medication Sig   Ascorbic Acid  (VITAMIN C ) 100 MG tablet Take 100 mg by mouth daily.   azelastine  (ASTELIN ) 0.1 % nasal spray Place 2 sprays into both nostrils 2 (two) times daily. Use in each nostril as directed   B Complex  Vitamins (VITAMIN B COMPLEX) TABS    Cholecalciferol (VITAMIN D3) 10 MCG (400 UNIT) CAPS    diltiazem  (CARDIZEM  CD) 360 MG 24 hr capsule Take 1 capsule (360 mg total) by mouth daily.   diphenoxylate -atropine  (LOMOTIL ) 2.5-0.025 MG tablet Take 2 tablets by mouth 4 (four) times daily as needed for diarrhea or loose stools.   hyoscyamine  (ANASPAZ ) 0.125 MG TBDP disintergrating tablet Place 1 tablet (0.125 mg total) under the tongue every 4 (four) hours as needed.   levocetirizine (XYZAL ) 5 MG tablet Take 1 tablet (5 mg total) by mouth in the morning and at bedtime.   Multiple Vitamin (MULTIVITAMIN WITH MINERALS) TABS tablet Take 1 tablet by mouth daily.   omeprazole  (PRILOSEC) 20 MG capsule Take 1 capsule (20 mg total) by mouth 2 (two) times daily before a meal.   rosuvastatin  (CRESTOR ) 10 MG tablet Take 1 tablet (10 mg total) by mouth daily.   sildenafil  (VIAGRA ) 50 MG tablet Take 2 tablets (100 mg total) by mouth as needed for erectile dysfunction.   No facility-administered medications prior to visit.        Objective    BP 135/78 (BP Location: Right Arm, Patient Position: Sitting, Cuff Size: Normal)   Pulse 66   Resp 16   Ht 5' 7 (1.702 m)   Wt 162 lb (73.5 kg)   SpO2 98%   BMI 25.37 kg/m  Physical Exam   Physical Exam GENERAL: Ill appearing, not in respiratory distress. HEENT: Oropharynx with erythema and postnasal drainage. Enlarged boggy turbinates, left more than right. No frontal or maxillary sinus tenderness. NECK: No anterior cervical lymphadenopathy. CHEST: No wheezing or crackles, frequent coughing. CARDIOVASCULAR: Regular rate and rhythm.   No results found for any visits on 03/08/24.  Assessment & Plan     Assessment and Plan Assessment & Plan Acute bronchitis Acute bronchitis with uncontrollable cough and chest congestion, worsening over the last few days. No fever, sore throat, or sputum production. No wheezing or crackles on pulmonary exam, but frequent  coughing. Differential diagnosis includes COVID-19, but no testing performed. No indication for antibiotics as symptoms have not persisted long enough and there is no sinus tenderness or crackles to suggest bacterial infection. - Prescribe prednisone  40 mg once daily for 5 days. - Prescribe cough syrup with promethazine DM, to be taken every 6 hours as needed, especially at night to aid sleep. - Advise to monitor symptoms and if no improvement by Wednesday 03/10/24, contact the office for additional medication management, plan on Amox 875mg  BID for 5 days if no improvement on current regimen        No follow-ups on file.        Rockie Agent, MD  [facility] [phone] (phone) [phone] (fax)  [facility]

## 2024-03-08 NOTE — Telephone Encounter (Signed)
 FYI Only or Action Required?: FYI only for provider.  Patient was last seen in primary care on 12/02/2023 by Sharma Coyer, MD.  Called Nurse Triage reporting Cough.  Symptoms began several days ago.  Triage Disposition: See HCP Within 4 Hours (Or PCP Triage)  Patient/caregiver understands and will follow disposition?: Yes              Copied from CRM #[id number]. Topic: Clinical - Red Word Triage >> Mar 08, 2024  8:51 AM William Coleman wrote: [facility] that prompted transfer to Nurse Triage: uncontrollable Coughing, in his chest, congestion Reason for Disposition  [1] MILD difficulty breathing (e.g., minimal/no SOB at rest, SOB with walking, pulse < 100) AND [2] still present when not coughing  Answer Assessment - Initial Assessment Questions This RN scheduled pt for an appt today with PCP in office.  Denies headache, fever, sore throat Can feel in chest if cough real hard No sinus pressure or pain  ONSET: When did the cough begin?      Ongoing sinus issues, got bad in last 2 days  SPUTUM: Describe the color of your sputum (e.g., none, dry cough; clear, white, yellow, green)     Haven't noticed. Might be clear  HEMOPTYSIS: Are you coughing up any blood? If Yes, ask: How much? (e.g., flecks, streaks, tablespoons, etc.)     No  DIFFICULTY BREATHING: Are you having difficulty breathing? If Yes, ask: How bad is it? (e.g., mild, moderate, severe)      Not really, just when coughing  Protocols used: Cough - Acute Productive-A-AH

## 2024-03-08 NOTE — Telephone Encounter (Signed)
 Patient call note and symptoms reviewed. Agree with scheduled appt. Will evaluate during OV

## 2024-03-08 NOTE — Patient Instructions (Signed)
 To keep you healthy, please keep in mind the following health maintenance items that you are due for:   Health Maintenance Due  Topic Date Due   Pneumococcal Vaccine: 50+ Years (1 of 1 - PCV) Never done   Zoster Vaccines- Shingrix (1 of 2) Never done   Influenza Vaccine  01/09/2024   COVID-19 Vaccine (7 - 2025-26 season) 02/09/2024     Best Wishes,   Dr. Lang

## 2024-03-10 ENCOUNTER — Ambulatory Visit: Payer: Self-pay

## 2024-03-10 NOTE — Telephone Encounter (Signed)
 FYI Only or Action Required?: FYI only for provider.  Patient was last seen in primary care on 03/08/2024 by Sharma Coyer, MD.  Called Nurse Triage reporting Cough.  Symptoms began several weeks ago.  Interventions attempted: Prescription medications: Prednisone /phenergan.  Symptoms are: gradually worsening.  Triage Disposition: See Physician Within 24 Hours  Patient/caregiver understands and will follow disposition?: Yes  Copied from CRM [id number]. Topic: Clinical - Red Word Triage >> Mar 10, 2024 10:49 AM Nathanel BROCKS wrote: Red Word that prompted transfer to Nurse Triage:  Pt went to Dr Lang on Monday. She gave him predisone and cough medication and he is not getting any better. He has gotten worse. Reason for Disposition  [1] Continuous (nonstop) coughing interferes with work or school AND [2] no improvement using cough treatment per Care Advice  Answer Assessment - Initial Assessment Questions Pt has been sick/coughing for >1 week, states its gotten worse over the last few days. PCP appointment this past Monday and rx prednisone /phenergan, but has not improved. PCP note that by 10/1 if not improving, potentially abx course, but the recommendation was amoxicillin but pt has allergy  to this med. Pt would like to be seen in office and evaluated for Flu/Cov/RSV as well as next medication recommendation.   1. ONSET: When did the cough begin?      >1 week ago, worsened over the last few days 2. SEVERITY: How bad is the cough today?      Coughing every few minutes 3. SPUTUM: Describe the color of your sputum (e.g., none, dry cough; clear, white, yellow, green)     denies 4. HEMOPTYSIS: Are you coughing up any blood? If Yes, ask: How much? (e.g., flecks, streaks, tablespoons, etc.)     denies 5. DIFFICULTY BREATHING: Are you having difficulty breathing? If Yes, ask: How bad is it? (e.g., mild, moderate, severe)      denies 6. FEVER: Do you have a fever? If  Yes, ask: What is your temperature, how was it measured, and when did it start?     denies 7. CARDIAC HISTORY: Do you have any history of heart disease? (e.g., heart attack, congestive heart failure)      denies 8. LUNG HISTORY: Do you have any history of lung disease?  (e.g., pulmonary embolus, asthma, emphysema)     denies 9. PE RISK FACTORS: Do you have a history of blood clots? (or: recent major surgery, recent prolonged travel, bedridden)     denies 10. OTHER SYMPTOMS: Do you have any other symptoms? (e.g., runny nose, wheezing, chest pain)       denies  12. TRAVEL: Have you traveled out of the country in the last month? (e.g., travel history, exposures)       denies  Protocols used: Cough - Acute Productive-A-AH

## 2024-03-11 ENCOUNTER — Ambulatory Visit (INDEPENDENT_AMBULATORY_CARE_PROVIDER_SITE_OTHER): Admitting: Family Medicine

## 2024-03-11 ENCOUNTER — Encounter: Payer: Self-pay | Admitting: Family Medicine

## 2024-03-11 VITALS — BP 140/87 | HR 71 | Wt 160.3 lb

## 2024-03-11 DIAGNOSIS — R059 Cough, unspecified: Secondary | ICD-10-CM

## 2024-03-11 DIAGNOSIS — J208 Acute bronchitis due to other specified organisms: Secondary | ICD-10-CM | POA: Diagnosis not present

## 2024-03-11 LAB — POCT INFLUENZA A/B
Influenza A, POC: NEGATIVE
Influenza B, POC: NEGATIVE

## 2024-03-11 LAB — POC COVID19 BINAXNOW: SARS Coronavirus 2 Ag: NEGATIVE

## 2024-03-11 MED ORDER — AZITHROMYCIN 250 MG PO TABS: ORAL_TABLET | ORAL | 0 refills | Status: DC

## 2024-03-11 NOTE — Progress Notes (Signed)
 Acute visit   Patient: William Coleman.   DOB: 1960/10/12   63 y.o. Male  MRN: [id number] PCP: Sharma Coyer, MD   Chief Complaint  Patient presents with   Follow-up    Pt seen in office 9/29 by Dr.Robinson given dx acute bronchitis and provided rx for prednisone  40 mg and promethazine-dm 6.25-15/5mL   Cough    Pt has been sick/coughing for >1 week, states its gotten worse over the last few days. PCP note that by 10/1 if not improving, potentially abx course, but the recommendation was amoxicillin but pt has allergy  to this med. Pt would like to be seen in office and evaluated for Flu/Cov/RSV as well as next medication recommendation   Subjective    Discussed the use of AI scribe software for clinical note transcription with the patient, who gave verbal consent to proceed.  History of Present Illness   William Coleman. is a 64 year old male with chronic sinus problems who presents with worsening cough.  He has a chronic cough and sinus problems that have worsened since last Saturday. He was prescribed prednisone  and a cough medicine on March 08, 2024, but his symptoms have not improved and are worsening. He experiences chest discomfort when coughing hard but denies hemoptysis, fever, sinus headache, sinus pressure, chest pain, wheezing, or shortness of breath, except during coughing episodes.  He was born three months premature and has had sinus and ENT problems since childhood. He is currently taking prednisone , with the last dose scheduled for Saturday morning, and a cough syrup at 5 mL every six hours, but has not experienced relief. He has not received a COVID or flu shot this year, as he was waiting until October but then fell ill.      Review of Systems  Objective    BP (!) 140/87 (BP Location: Left Arm, Patient Position: Sitting, Cuff Size: Normal)   Pulse 71   Wt 160 lb 4.8 oz (72.7 kg)   SpO2 100%   BMI 25.11 kg/m   Physical  Exam Vitals reviewed.  Constitutional:      General: He is not in acute distress.    Appearance: Normal appearance. He is not diaphoretic.  HENT:     Head: Normocephalic and atraumatic.  Eyes:     General: No scleral icterus.    Conjunctiva/sclera: Conjunctivae normal.  Cardiovascular:     Rate and Rhythm: Normal rate and regular rhythm.     Heart sounds: Normal heart sounds. No murmur heard. Pulmonary:     Effort: Pulmonary effort is normal. No respiratory distress.     Breath sounds: Rhonchi present. No wheezing.  Musculoskeletal:     Cervical back: Neck supple.     Right lower leg: No edema.     Left lower leg: No edema.  Lymphadenopathy:     Cervical: No cervical adenopathy.  Skin:    General: Skin is warm and dry.     Findings: No rash.  Neurological:     Mental Status: He is alert and oriented to person, place, and time. Mental status is at baseline.  Psychiatric:        Mood and Affect: Mood normal.        Behavior: Behavior normal.       Results for orders placed or performed in visit on 03/11/24  POCT Influenza A/B  Result Value Ref Range   Influenza A, POC Negative Negative   Influenza B, POC  Negative Negative  POC COVID-19  Result Value Ref Range   SARS Coronavirus 2 Ag Negative Negative    Assessment & Plan     Problem List Items Addressed This Visit   None Visit Diagnoses       Acute bronchitis due to other specified organisms    -  Primary   Relevant Medications   azithromycin  (ZITHROMAX ) 250 MG tablet     Cough, unspecified type       Relevant Orders   POCT Influenza A/B (Completed)   POC COVID-19 (Completed)           Acute bronchitis Chronic cough and sinus issues have worsened since last Saturday. Negative for COVID and flu. Coarse breath sounds consistent with bronchitis. No fever, sinus headache, or chest pain. No wheezing or shortness of breath except during coughing. Likely viral etiology, but considering bacterial due to  persistence of symptoms despite prednisone  and cough medicine. - Prescribe azithromycin  (Z-Pak) for 5 days, take once daily with food due to potential bacterial cause and its anti-inflammatory properties. - Continue and complete current prednisone  course. - Continue cough syrup as needed. - Advise that cough may persist for weeks despite treatment. - Advise to wait at least two weeks after feeling well and completing prednisone  before receiving flu or COVID vaccinations.         Meds ordered this encounter  Medications   azithromycin  (ZITHROMAX ) 250 MG tablet    Sig: Take 500mg  PO daily x1d and then 250mg  daily x4 days    Dispense:  6 each    Refill:  0     Return if symptoms worsen or fail to improve.      Jon Eva, MD  [facility] [phone] (phone) [phone] (fax)  [facility]

## 2024-03-19 ENCOUNTER — Other Ambulatory Visit: Payer: Self-pay | Admitting: Family Medicine

## 2024-05-27 ENCOUNTER — Ambulatory Visit: Admitting: Family Medicine

## 2024-05-27 ENCOUNTER — Encounter: Payer: Self-pay | Admitting: Family Medicine

## 2024-05-27 VITALS — BP 130/83 | HR 68 | Ht 67.0 in | Wt 163.1 lb

## 2024-05-27 DIAGNOSIS — Z23 Encounter for immunization: Secondary | ICD-10-CM | POA: Diagnosis not present

## 2024-05-27 DIAGNOSIS — J3089 Other allergic rhinitis: Secondary | ICD-10-CM | POA: Diagnosis not present

## 2024-05-27 DIAGNOSIS — R0981 Nasal congestion: Secondary | ICD-10-CM | POA: Diagnosis not present

## 2024-05-27 DIAGNOSIS — E782 Mixed hyperlipidemia: Secondary | ICD-10-CM

## 2024-05-27 DIAGNOSIS — F428 Other obsessive-compulsive disorder: Secondary | ICD-10-CM | POA: Diagnosis not present

## 2024-05-27 DIAGNOSIS — I10 Essential (primary) hypertension: Secondary | ICD-10-CM | POA: Diagnosis not present

## 2024-05-27 DIAGNOSIS — K219 Gastro-esophageal reflux disease without esophagitis: Secondary | ICD-10-CM | POA: Diagnosis not present

## 2024-05-27 DIAGNOSIS — N5203 Combined arterial insufficiency and corporo-venous occlusive erectile dysfunction: Secondary | ICD-10-CM | POA: Diagnosis not present

## 2024-05-27 NOTE — Patient Instructions (Signed)
 To keep you healthy, please keep in mind the following health maintenance items that you are due for:   Health Maintenance Due  Topic Date Due   Pneumococcal Vaccine: 50+ Years (1 of 1 - PCV) Never done   Zoster Vaccines- Shingrix (1 of 2) Never done     Best Wishes,   Dr. Lang

## 2024-05-27 NOTE — Progress Notes (Unsigned)
 Established Patient Office Visit  Patient ID: William Coleman., male    DOB: [birth year]  Age: 63 y.o. MRN: [id number] PCP: Sharma Coyer, MD  Chief Complaint  Patient presents with   Medical Management of Chronic Issues    Patient is present for follow up with PCP.     Subjective:     HPI  Discussed the use of AI scribe software for clinical note transcription with the patient, who gave verbal consent to proceed.  History of Present Illness    {History (Optional):23778}  ROS    Objective:     BP 130/83 (BP Location: Right Arm, Patient Position: Sitting, Cuff Size: Normal)   Pulse 68   Ht 5' 7 (1.702 m)   Wt 163 lb 1.6 oz (74 kg)   SpO2 98%   BMI 25.55 kg/m  BP Readings from Last 3 Encounters:  05/27/24 130/83  03/11/24 (!) 140/87  03/08/24 135/78   Wt Readings from Last 3 Encounters:  05/27/24 163 lb 1.6 oz (74 kg)  03/11/24 160 lb 4.8 oz (72.7 kg)  03/08/24 162 lb (73.5 kg)      Physical Exam Vitals reviewed.  Constitutional:      General: He is not in acute distress.    Appearance: Normal appearance. He is not ill-appearing.  Cardiovascular:     Rate and Rhythm: Normal rate and regular rhythm.  Pulmonary:     Effort: Pulmonary effort is normal. No respiratory distress.     Breath sounds: No wheezing, rhonchi or rales.  Neurological:     Mental Status: He is alert and oriented to person, place, and time.  Psychiatric:        Mood and Affect: Mood normal.        Behavior: Behavior normal.     {PhysExam Abridge (Optional):210964309} No results found for any visits on 05/27/24.  Last metabolic panel Lab Results  Component Value Date   GLUCOSE 90 06/20/2023   NA 140 06/20/2023   K 3.9 06/20/2023   CL 101 06/20/2023   CO2 25 06/20/2023   BUN 16 06/20/2023   CREATININE 1.04 06/20/2023   EGFR 82 06/20/2023   CALCIUM  9.3 06/20/2023   PROT 7.3 06/20/2023   ALBUMIN 4.7 06/20/2023   LABGLOB 2.6 06/20/2023   AGRATIO 2.0  05/14/2022   BILITOT 0.7 06/20/2023   ALKPHOS 91 06/20/2023   AST 30 06/20/2023   ALT 29 06/20/2023   ANIONGAP 11 11/24/2018   Last lipids Lab Results  Component Value Date   CHOL 153 06/20/2023   HDL 46 06/20/2023   LDLCALC 85 06/20/2023   TRIG 122 06/20/2023   CHOLHDL 3.3 06/20/2023   Last hemoglobin A1c Lab Results  Component Value Date   HGBA1C 5.6 06/20/2023   Last thyroid  functions Lab Results  Component Value Date   TSH 0.595 06/20/2023   FREET4 1.26 06/20/2023   Last vitamin D No results found for: 25OHVITD2, 25OHVITD3, VD25OH Last vitamin B12 and Folate Lab Results  Component Value Date   VITAMINB12 790 01/01/2017      The 10-year ASCVD risk score (Arnett DK, et al., 2019) is: 10.3%    Assessment & Plan:   Problem List Items Addressed This Visit     Chronic nasal congestion   Combined arterial insufficiency and corporo-venous occlusive erectile dysfunction   HLD (hyperlipidemia)   HTN (hypertension) - Primary   Laryngopharyngeal reflux (LPR)   Non-seasonal allergic rhinitis   OCD (obsessive compulsive disorder)   Other Visit  Diagnoses       Need for pneumococcal vaccine       Relevant Orders   Pneumococcal conjugate vaccine 20-valent (Prevnar 20)       Assessment and Plan Assessment & Plan     Return in about 3 months (around 08/25/2024) for CPE.    Rockie Agent, MD [facility]

## 2024-05-28 NOTE — Assessment & Plan Note (Signed)
 Laryngopharyngeal reflux Chronic laryngopharyngeal reflux managed with omeprazole  20 mg twice daily. - Continue omeprazole  20 mg twice daily.

## 2024-05-28 NOTE — Assessment & Plan Note (Signed)
" ° °  Erectile dysfunction Chronic erectile dysfunction managed with sildenafil  100 mg as needed. - Continue sildenafil  100 mg as needed. "

## 2024-05-28 NOTE — Progress Notes (Signed)
 William Coleman.                                          MRN: [id number]   05/28/2024   The [facility] Quality Team Specialist reviewed this patient medical record for the purposes of chart review for care gap closure. The following were reviewed: abstraction for care gap closure-controlling blood pressure.    [facility] Quality Team

## 2024-05-28 NOTE — Assessment & Plan Note (Signed)
 Chronic medications as listed in chronic allergic rhinitis  F/u with ENT

## 2024-05-28 NOTE — Assessment & Plan Note (Signed)
" °  Hyperlipidemia Chronic hyperlipidemia, managed with rosuvastatin  10 mg daily. Lipid levels are within goal range. - Continue rosuvastatin  10 mg daily. "

## 2024-05-28 NOTE — Assessment & Plan Note (Signed)
 Hypertension Chronic hypertension, well-controlled with diltiazem  360 mg daily. Blood pressure is 130/83 mmHg, with a goal to maintain below 150/90 mmHg and an ideal target of 120s/70s. - Continue diltiazem  360 mg daily.

## 2024-05-28 NOTE — Assessment & Plan Note (Signed)
" °  Non-seasonal allergic rhinitis Chronic non-seasonal allergic rhinitis with nasal congestion, managed with azelastine  nasal spray and Xyzal  5 mg twice daily. He reports no significant change with twice daily dosing of Xyzal  but prefers to continue this regimen. - Continue azelastine  nasal spray as needed. - Continue Xyzal  5 mg twice daily. "

## 2024-06-15 ENCOUNTER — Ambulatory Visit (INDEPENDENT_AMBULATORY_CARE_PROVIDER_SITE_OTHER): Admitting: Family Medicine

## 2024-06-15 ENCOUNTER — Encounter: Payer: Self-pay | Admitting: Family Medicine

## 2024-06-15 VITALS — BP 137/89 | HR 78 | Temp 98.6°F | Resp 16 | Ht 67.0 in | Wt 161.5 lb

## 2024-06-15 DIAGNOSIS — J069 Acute upper respiratory infection, unspecified: Secondary | ICD-10-CM | POA: Diagnosis not present

## 2024-06-15 MED ORDER — GUAIFENESIN ER 600 MG PO TB12: 600.0000 mg | ORAL_TABLET | Freq: Two times a day (BID) | ORAL | 0 refills | Status: AC | PRN

## 2024-06-15 MED ORDER — FLUTICASONE PROPIONATE 50 MCG/ACT NA SUSP: 2.0000 | Freq: Every day | NASAL | 6 refills | Status: AC

## 2024-06-15 MED ORDER — BENZONATATE 100 MG PO CAPS: 100.0000 mg | ORAL_CAPSULE | Freq: Two times a day (BID) | ORAL | 0 refills | Status: AC | PRN

## 2024-06-15 NOTE — Progress Notes (Signed)
 "     Acute visit   Patient: William Coleman.   DOB: 1960/06/17   64 y.o. Male  MRN: [id number] PCP: Sharma Coyer, MD   Chief Complaint  Patient presents with   Acute Visit    Cough, chest congestion, sore throat, congestion x 5 daysno otc medication taken. Takes his daily xyzal , vit c. Received his pneumoccocal vaccine a few weeks ago   Subjective    Discussed the use of AI scribe software for clinical note transcription with the patient, who gave verbal consent to proceed.  History of Present Illness   William Coleman. is a 64 year old male who presents with cough, sinus drainage, and chest congestion.  He has had 5 days of cough, sinus drainage, sneezing, sore throat, and chest congestion without fever. The congestion feels like it is starting to settle in his chest. He denies shortness of breath and significant sputum production.  He is taking Xyzal  and vitamin C  only for these symptoms. He had bronchitis 2 to 3 months ago treated with antibiotics and prednisone , but he does not recall which helped most.  He received a pneumonia vaccine about 3 weeks ago. He has had recent contact with sick friends and relatives, including a family gathering on December 27th, with symptom onset about a week later.        Review of Systems  Objective    BP 137/89   Pulse 78   Temp 98.6 F (37 C) (Oral)   Resp 16   Ht 5' 7 (1.702 m)   Wt 161 lb 8 oz (73.3 kg)   SpO2 96%   BMI 25.29 kg/m   Physical Exam Vitals reviewed.  Constitutional:      General: He is not in acute distress.    Appearance: Normal appearance. He is not diaphoretic.  HENT:     Head: Normocephalic and atraumatic.     Right Ear: Tympanic membrane, ear canal and external ear normal.     Left Ear: Tympanic membrane, ear canal and external ear normal.     Nose: Congestion present.     Mouth/Throat:     Mouth: Mucous membranes are moist.     Pharynx: Oropharynx is clear. No  oropharyngeal exudate.  Eyes:     General: No scleral icterus.    Conjunctiva/sclera: Conjunctivae normal.  Cardiovascular:     Rate and Rhythm: Normal rate and regular rhythm.     Heart sounds: Normal heart sounds. No murmur heard. Pulmonary:     Effort: Pulmonary effort is normal. No respiratory distress.     Breath sounds: Normal breath sounds. No wheezing or rhonchi.  Musculoskeletal:     Cervical back: Neck supple.  Lymphadenopathy:     Cervical: No cervical adenopathy.  Skin:    General: Skin is warm and dry.  Neurological:     Mental Status: He is alert. Mental status is at baseline.  Psychiatric:        Mood and Affect: Mood normal.        Behavior: Behavior normal.       No results found for any visits on 06/15/24.  Assessment & Plan     Problem List Items Addressed This Visit   None Visit Diagnoses       Viral URI with cough    -  Primary           Acute upper respiratory infection Symptoms of cough, sinus drainage, and sore throat for five  days. No fever or shortness of breath. Lungs are clear, indicating no pneumonia or bronchitis. Likely viral etiology, possibly flu, but antivirals are not indicated due to symptom duration. Expected symptom duration is 7-10 days. - Prescribed Tessalon  Perles for cough, to be used as needed up to twice daily. - Recommended over-the-counter Mucinex  for chest congestion. - Recommended over-the-counter Flonase  or nasal saline for nasal congestion. - Advised use of honey for cough relief, including honey cough drops or honey in tea. - Instructed to monitor symptoms and contact if condition worsens or does not improve.       Meds ordered this encounter  Medications   benzonatate  (TESSALON ) 100 MG capsule    Sig: Take 1 capsule (100 mg total) by mouth 2 (two) times daily as needed for cough.    Dispense:  20 capsule    Refill:  0   guaiFENesin  (MUCINEX ) 600 MG 12 hr tablet    Sig: Take 1 tablet (600 mg total) by mouth 2  (two) times daily as needed.    Dispense:  60 tablet    Refill:  0   fluticasone  (FLONASE ) 50 MCG/ACT nasal spray    Sig: Place 2 sprays into both nostrils daily.    Dispense:  16 g    Refill:  6     Return if symptoms worsen or fail to improve.      Jon Eva, MD  [facility] [phone] (phone) [phone] (fax)  [facility]  "

## 2024-06-18 ENCOUNTER — Other Ambulatory Visit: Payer: Self-pay | Admitting: Family Medicine

## 2024-06-22 ENCOUNTER — Encounter: Payer: Self-pay | Admitting: Family Medicine

## 2024-06-22 ENCOUNTER — Ambulatory Visit: Admitting: Family Medicine

## 2024-06-22 VITALS — BP 146/87 | HR 71 | Ht 67.0 in | Wt 160.3 lb

## 2024-06-22 DIAGNOSIS — J069 Acute upper respiratory infection, unspecified: Secondary | ICD-10-CM

## 2024-06-22 MED ORDER — HYDROCOD POLI-CHLORPHE POLI ER 10-8 MG/5ML PO SUER: 5.0000 mL | Freq: Two times a day (BID) | ORAL | 0 refills | Status: AC | PRN

## 2024-06-22 NOTE — Patient Instructions (Signed)
 To keep you healthy, please keep in mind the following health maintenance items that you are due for:   Health Maintenance Due  Topic Date Due   Zoster Vaccines- Shingrix (1 of 2) Never done     Best Wishes,   Dr. Lang

## 2024-06-22 NOTE — Progress Notes (Signed)
 "  Acute Office Visit  Patient ID: William Methot., male    DOB: 04/03/1961, 64 y.o.   MRN: [id number]   PCP: Sharma Coyer, MD  Chief Complaint  Patient presents with   URI    Seen by Dr. B last week, given tessalon  pearles, flonase  and mucinex  to treat syx. Patient reports no improvement in symptoms     Subjective:     HPI  Discussed the use of AI scribe software for clinical note transcription with the patient, who gave verbal consent to proceed.  History of Present Illness William Radell. is a 64 year old male who presents with a persistent cough.  He has been experiencing a persistent cough for at least two weeks, which has not improved despite treatment with Flonase , Mucinex , Tessalon  Perles, and honey cough drops. The cough is mostly dry, with occasional sensations of needing to expectorate, but it is not consistently productive. Some improvement in symptoms is noted, but coughing and drainage persist. No fever, wheezing, or difficulty breathing.  He recalls a similar sinus problem about nine to ten years ago and mentions a history of regular year-round cough, which may be exacerbated by the current respiratory virus. He has received a flu shot, COVID vaccine, and pneumonia vaccine prior to the onset of his current symptoms.  He reports no ear pain or sore throat but does experience some congestion, particularly in the right ear, which he attributes to drainage. His blood pressure was elevated at 146/87, which he attributes to not feeling well.  No fever, wheezing, difficulty breathing, ear pain, sore throat, or facial or head pressure. He reports drainage and a persistent dry cough.   ROS     Objective:    BP (!) 146/87 (BP Location: Left Arm, Patient Position: Sitting, Cuff Size: Normal)   Pulse 71   Ht 5' 7 (1.702 m)   Wt 160 lb 4.8 oz (72.7 kg)   SpO2 97%   BMI 25.11 kg/m  BP Readings from Last 3 Encounters:  06/22/24 (!) 146/87   06/15/24 137/89  05/27/24 130/83   Wt Readings from Last 3 Encounters:  06/22/24 160 lb 4.8 oz (72.7 kg)  06/15/24 161 lb 8 oz (73.3 kg)  05/27/24 163 lb 1.6 oz (74 kg)       Physical Exam VITALS: BP- 146/87 HEENT: Bilateral tympanic membrane effusion, no redness, no bulging. Normal oropharynx. Nares normal. No maxillary or frontal sinus tenderness. NECK: No lymphadenopathy. CHEST: Clear to auscultation bilaterally, no wheezes, no crackles.    No results found for any visits on 06/22/24.     Assessment & Plan:   Problem List Items Addressed This Visit   None Visit Diagnoses       Viral URI with cough    -  Primary   Relevant Medications   chlorpheniramine-HYDROcodone  (TUSSIONEX) 10-8 MG/5ML       Assessment and Plan Assessment & Plan Acute upper respiratory infection with persistent cough Persistent dry cough with drainage, no fever, wheezing, or difficulty breathing. No wheezing or crackles on lung exam. Symptoms consistent with post-infectious cough, which can last up to eight weeks. No signs of sinus infection. Improvement noted, but cough persists. - Prescribed Desenex cough syrup to manage cough. - Continue Tessalon  Perles and Mucinex  as needed. - Advised use of hot drinks with honey for symptomatic relief. - Monitor symptoms; if cough persists beyond mid to end of March, further investigation may be needed.  Bilateral tympanic membrane effusion Congestion  in the right ear without pain or significant symptoms. No redness or bulging of tympanic membranes. Congestion likely related to upper respiratory infection and postnasal drip. - Continue to monitor ear symptoms; no immediate intervention required unless symptoms worsen.    Meds ordered this encounter  Medications   chlorpheniramine-HYDROcodone  (TUSSIONEX) 10-8 MG/5ML    Sig: Take 5 mLs by mouth every 12 (twelve) hours as needed for cough.    Dispense:  473 mL    Refill:  0    No follow-ups on  file.  William Agent, MD [facility]   "

## 2024-08-27 ENCOUNTER — Encounter: Admitting: Family Medicine

## 2024-09-07 ENCOUNTER — Encounter: Admitting: Family Medicine

## 2024-09-21 ENCOUNTER — Ambulatory Visit (INDEPENDENT_AMBULATORY_CARE_PROVIDER_SITE_OTHER): Admitting: Vascular Surgery

## 2024-12-29 ENCOUNTER — Ambulatory Visit
